# Patient Record
Sex: Male | Born: 1965 | Race: White | Hispanic: No | Marital: Single | Smoking: Former smoker
Health system: Southern US, Community
[De-identification: ages and names within clinical notes are randomized; demographics above are authoritative.]

## PROBLEM LIST (undated history)

## (undated) ENCOUNTER — Ambulatory Visit (HOSPITAL_COMMUNITY): Admission: EM | Payer: Self-pay | Source: Home / Self Care

## (undated) DIAGNOSIS — E039 Hypothyroidism, unspecified: Secondary | ICD-10-CM

## (undated) DIAGNOSIS — F419 Anxiety disorder, unspecified: Secondary | ICD-10-CM

## (undated) DIAGNOSIS — R519 Headache, unspecified: Secondary | ICD-10-CM

## (undated) DIAGNOSIS — F32A Depression, unspecified: Secondary | ICD-10-CM

## (undated) DIAGNOSIS — M7989 Other specified soft tissue disorders: Secondary | ICD-10-CM

## (undated) DIAGNOSIS — D496 Neoplasm of unspecified behavior of brain: Secondary | ICD-10-CM

## (undated) DIAGNOSIS — F1910 Other psychoactive substance abuse, uncomplicated: Secondary | ICD-10-CM

## (undated) DIAGNOSIS — F909 Attention-deficit hyperactivity disorder, unspecified type: Secondary | ICD-10-CM

## (undated) HISTORY — DX: Anxiety disorder, unspecified: F41.9

## (undated) HISTORY — DX: Depression, unspecified: F32.A

## (undated) HISTORY — PX: OTHER SURGICAL HISTORY: SHX169

---

## 2011-02-07 ENCOUNTER — Emergency Department (HOSPITAL_COMMUNITY)
Admission: EM | Admit: 2011-02-07 | Discharge: 2011-02-09 | Disposition: A | Discharge status: Home / Self Care | Payer: Self-pay | Attending: Emergency Medicine | Admitting: Emergency Medicine

## 2011-02-07 DIAGNOSIS — Z046 Encounter for general psychiatric examination, requested by authority: Secondary | ICD-10-CM | POA: Insufficient documentation

## 2011-02-07 DIAGNOSIS — F1910 Other psychoactive substance abuse, uncomplicated: Secondary | ICD-10-CM | POA: Insufficient documentation

## 2011-02-07 LAB — COMPREHENSIVE METABOLIC PANEL
Alkaline Phosphatase: 104 U/L (ref 39–117)
BUN: 15 mg/dL (ref 6–23)
CO2: 26 mEq/L (ref 19–32)
Chloride: 102 mEq/L (ref 96–112)
GFR calc Af Amer: >90 mL/min (ref >90)
Glucose, Bld: 68 mg/dL — ABNORMAL LOW (ref 70–99)
Potassium: 3.4 mEq/L — ABNORMAL LOW (ref 3.5–5.1)
Total Bilirubin: 0.1 mg/dL — ABNORMAL LOW (ref 0.3–1.2)

## 2011-02-07 LAB — URINALYSIS, ROUTINE W REFLEX MICROSCOPIC
Bilirubin Urine: NEGATIVE
Ketones, ur: NEGATIVE mg/dL
Nitrite: NEGATIVE
Urobilinogen, UA: 1 mg/dL (ref 0.0–1.0)
pH: 6.5 (ref 5.0–8.0)

## 2011-02-07 LAB — DIFFERENTIAL
Basophils Absolute: 0 10*3/uL (ref 0.0–0.1)
Lymphocytes Relative: 37 % (ref 12–46)
Neutro Abs: 1.4 10*3/uL — ABNORMAL LOW (ref 1.7–7.7)

## 2011-02-07 LAB — CBC
HCT: 48 % (ref 39.0–52.0)
Hemoglobin: 16.6 g/dL (ref 13.0–17.0)
WBC: 3.2 10*3/uL — ABNORMAL LOW (ref 4.0–10.5)

## 2011-02-07 LAB — ETHANOL: Alcohol, Ethyl (B): <11 mg/dL (ref 0–11)

## 2011-02-08 LAB — RAPID URINE DRUG SCREEN, HOSP PERFORMED
Barbiturates: NOT DETECTED
Benzodiazepines: NOT DETECTED

## 2011-03-24 ENCOUNTER — Encounter: Payer: Self-pay | Admitting: *Deleted

## 2011-03-24 ENCOUNTER — Emergency Department (HOSPITAL_COMMUNITY)
Admission: EM | Admit: 2011-03-24 | Discharge: 2011-03-24 | Discharge status: Against Medical Advice | Payer: Self-pay | Attending: Emergency Medicine | Admitting: Emergency Medicine

## 2011-03-24 DIAGNOSIS — F1110 Opioid abuse, uncomplicated: Secondary | ICD-10-CM | POA: Insufficient documentation

## 2011-03-24 DIAGNOSIS — Z4802 Encounter for removal of sutures: Secondary | ICD-10-CM | POA: Insufficient documentation

## 2011-03-24 DIAGNOSIS — F1190 Opioid use, unspecified, uncomplicated: Secondary | ICD-10-CM

## 2011-03-24 HISTORY — DX: Other psychoactive substance abuse, uncomplicated: F19.10

## 2011-03-24 HISTORY — DX: Neoplasm of unspecified behavior of brain: D49.6

## 2011-03-24 LAB — CBC
HCT: 45 % (ref 39.0–52.0)
RDW: 12.5 % (ref 11.5–15.5)
WBC: 9.9 10*3/uL (ref 4.0–10.5)

## 2011-03-24 LAB — COMPREHENSIVE METABOLIC PANEL
ALT: 12 U/L (ref 0–53)
AST: 13 U/L (ref 0–37)
Albumin: 4 g/dL (ref 3.5–5.2)
Alkaline Phosphatase: 99 U/L (ref 39–117)
Glucose, Bld: 96 mg/dL (ref 70–99)
Potassium: 3.6 mEq/L (ref 3.5–5.1)
Sodium: 139 mEq/L (ref 135–145)
Total Protein: 7.3 g/dL (ref 6.0–8.3)

## 2011-03-24 LAB — RAPID URINE DRUG SCREEN, HOSP PERFORMED
Amphetamines: POSITIVE — AB
Barbiturates: NOT DETECTED
Tetrahydrocannabinol: NOT DETECTED

## 2011-03-24 LAB — ETHANOL: Alcohol, Ethyl (B): <11 mg/dL (ref 0–11)

## 2011-03-24 MED ORDER — ZOLPIDEM TARTRATE 5 MG PO TABS: 5.0000 mg | ORAL_TABLET | Freq: Every evening | ORAL | Status: DC | PRN

## 2011-03-24 MED ORDER — LORAZEPAM 1 MG PO TABS: 1.0000 mg | ORAL_TABLET | Freq: Three times a day (TID) | ORAL | Status: DC | PRN

## 2011-03-24 MED ORDER — NICOTINE 21 MG/24HR TD PT24: 21.0000 mg | MEDICATED_PATCH | Freq: Every day | TRANSDERMAL | Status: DC

## 2011-03-24 NOTE — ED Provider Notes (Signed)
History     CSN: [id number] Arrival date & time: 03/24/2011  8:16 PM   None     Chief Complaint  Patient presents with  . Medical Clearance    (Consider location/radiation/quality/duration/timing/severity/associated sxs/prior treatment) HPI Comments: Patient is a 45 year old man who has been abusing heroin. He used last a little while ago. He comes in seeking detox treatment. He wants to go to hold [facility].  Patient is a 45 y.o. male presenting with mental health disorder. The history is provided by the patient and medical records. No language interpreter was used.  Mental Health Problem Primary symptoms comment: He is a heroin abuser, and has had treatment This is a chronic problem.  The degree of incapacity that he is experiencing as a consequence of his illness is moderate. Sequelae of the illness include harmed interpersonal relations. He does not admit to suicidal ideas. He does not have a plan to commit suicide. He does not contemplate harming himself. He has not already injured self. He does not contemplate injuring another person. He has not already  injured another person. Risk factors that are present for mental illness include substance abuse.    Past Medical History  Diagnosis Date  . Substance abuse   . Brain tumor     History reviewed. No pertinent past surgical history.  History reviewed. No pertinent family history.  History  Substance Use Topics  . Smoking status: Current Everyday Smoker  . Smokeless tobacco: Not on file  . Alcohol Use: No      Review of Systems  Constitutional: Negative.   HENT: Negative.   Eyes: Negative.   Respiratory: Negative.   Cardiovascular: Negative.   Gastrointestinal: Negative.   Genitourinary: Negative.   Musculoskeletal: Negative.   Skin:       He has a laceration on the left index finger that was sutured 10 days ago, that needs suture removal.  Neurological: Negative.   Psychiatric/Behavioral: Negative.      Allergies  Review of patient's allergies indicates no known allergies.  Home Medications   Current Outpatient Rx  Name Route Sig Dispense Refill  . AMPHETAMINE-DEXTROAMPHETAMINE 30 MG PO TABS Oral Take 30 mg by mouth 3 (three) times daily.      Marland Kitchen LEVOTHYROXINE SODIUM 150 MCG PO TABS Oral Take 150 mcg by mouth daily.        BP 135/83  Pulse 102  Temp(Src) 98.5 F (36.9 C) (Oral)  Resp 20  SpO2 97%  Physical Exam  Constitutional: He is oriented to person, place, and time. He appears well-developed and well-nourished. No distress.  HENT:  Head: Normocephalic and atraumatic.  Right Ear: External ear normal.  Left Ear: External ear normal.  Mouth/Throat: Oropharynx is clear and moist.  Eyes: Conjunctivae and EOM are normal. Pupils are equal, round, and reactive to light.  Neck: Normal range of motion. Neck supple.  Cardiovascular: Normal rate and regular rhythm.   Pulmonary/Chest: Effort normal and breath sounds normal.  Abdominal: Soft. Bowel sounds are normal.  Musculoskeletal: Normal range of motion.  Neurological: He is alert and oriented to person, place, and time.       No sensory or motor deficits. Patient is not tremulous.  Skin: Skin is warm and dry.       He has a healed laceration on the thenar side of the left index finger PIP joint. There is no infection. He is no apparent loss of sensation or tendon function. There is no apparent foreign body.  Psychiatric:  He has a normal mood and affect. His behavior is normal.    ED Course  SUTURE REMOVAL Date/Time: 03/24/2011 9:14 PM Performed by: Osvaldo Human Authorized by: Osvaldo Human Consent: Verbal consent obtained. Consent given by: patient Patient understanding: patient states understanding of the procedure being performed Patient consent: the patient's understanding of the procedure matches consent given Site marked: the operative site was not marked Imaging studies: imaging studies not  available Required items: required blood products, implants, devices, and special equipment available Patient identity confirmed: verbally with patient and hospital-assigned identification number Time out: Immediately prior to procedure a "time out" was called to verify the correct patient, procedure, equipment, support staff and site/side marked as required. Body area: upper extremity Location details: left index finger Wound Appearance: clean Sutures Removed: 4 Patient tolerance: Patient tolerated the procedure well with no immediate complications.   (including critical care time)  Labs Reviewed  COMPREHENSIVE METABOLIC PANEL - Abnormal; Notable for the following:    Total Bilirubin 0.1 (*)    GFR calc non Af Amer 82 (*)    All other components within normal limits  URINE RAPID DRUG SCREEN (HOSP PERFORMED) - Abnormal; Notable for the following:    Opiates POSITIVE (*)    Cocaine POSITIVE (*)    Amphetamines POSITIVE (*)    All other components within normal limits  CBC  ETHANOL    9:39 PM Pt seen --> physical exam performed.  Lab workup ordered.  Orders to move to Psych ED entered.  9:39 PM Pt wants to leave, because he was placed in a locked unit.  He is here voluntarily.  I asked if he would stay if he were in the TCU, and he agreed that he would.  Asked charge nurse to facilitate this.     1. Heroin user       Carleene Cooper III, MD 03/24/11 2139

## 2011-03-24 NOTE — ED Notes (Signed)
Pt in requesting opiate detox, states he wants to go to [facility], last use was a few hours ago, denies ETOH or other substance use, denies SI/HI

## 2013-05-03 ENCOUNTER — Ambulatory Visit: Payer: Self-pay | Admitting: Family Medicine

## 2013-05-08 ENCOUNTER — Ambulatory Visit: Payer: Self-pay | Admitting: Family Medicine

## 2013-05-16 ENCOUNTER — Ambulatory Visit: Payer: Self-pay | Admitting: Internal Medicine

## 2013-05-16 ENCOUNTER — Telehealth: Payer: Self-pay | Admitting: General Practice

## 2013-05-16 NOTE — Telephone Encounter (Signed)
05/16/13 Called pt to reschedule appt, invalid number.

## 2013-06-21 ENCOUNTER — Ambulatory Visit: Payer: Self-pay | Admitting: Internal Medicine

## 2016-04-01 DIAGNOSIS — C715 Malignant neoplasm of cerebral ventricle: Secondary | ICD-10-CM | POA: Insufficient documentation

## 2017-01-17 DIAGNOSIS — E039 Hypothyroidism, unspecified: Secondary | ICD-10-CM | POA: Insufficient documentation

## 2017-01-20 DIAGNOSIS — F0634 Mood disorder due to known physiological condition with mixed features: Secondary | ICD-10-CM | POA: Insufficient documentation

## 2017-01-20 DIAGNOSIS — F1010 Alcohol abuse, uncomplicated: Secondary | ICD-10-CM | POA: Insufficient documentation

## 2017-02-22 DIAGNOSIS — F1020 Alcohol dependence, uncomplicated: Secondary | ICD-10-CM | POA: Diagnosis present

## 2017-03-26 DIAGNOSIS — F10931 Alcohol use, unspecified with withdrawal delirium: Secondary | ICD-10-CM | POA: Insufficient documentation

## 2017-03-26 DIAGNOSIS — F10231 Alcohol dependence with withdrawal delirium: Secondary | ICD-10-CM | POA: Insufficient documentation

## 2017-05-23 ENCOUNTER — Emergency Department (HOSPITAL_COMMUNITY)
Admission: EM | Admit: 2017-05-23 | Discharge: 2017-05-24 | Discharge status: Against Medical Advice | Payer: Medicare HMO | Attending: Physician Assistant | Admitting: Physician Assistant

## 2017-05-23 ENCOUNTER — Other Ambulatory Visit: Payer: Self-pay

## 2017-05-23 ENCOUNTER — Encounter (HOSPITAL_COMMUNITY): Payer: Self-pay | Admitting: *Deleted

## 2017-05-23 DIAGNOSIS — F10920 Alcohol use, unspecified with intoxication, uncomplicated: Secondary | ICD-10-CM

## 2017-05-23 DIAGNOSIS — F17200 Nicotine dependence, unspecified, uncomplicated: Secondary | ICD-10-CM | POA: Insufficient documentation

## 2017-05-23 DIAGNOSIS — F1920 Other psychoactive substance dependence, uncomplicated: Secondary | ICD-10-CM | POA: Insufficient documentation

## 2017-05-23 DIAGNOSIS — Z79899 Other long term (current) drug therapy: Secondary | ICD-10-CM | POA: Diagnosis not present

## 2017-05-23 DIAGNOSIS — F1910 Other psychoactive substance abuse, uncomplicated: Secondary | ICD-10-CM

## 2017-05-23 DIAGNOSIS — F10120 Alcohol abuse with intoxication, uncomplicated: Secondary | ICD-10-CM | POA: Insufficient documentation

## 2017-05-23 LAB — COMPREHENSIVE METABOLIC PANEL
ALK PHOS: 131 U/L — AB (ref 38–126)
ALT: 25 U/L (ref 17–63)
ANION GAP: 15 (ref 5–15)
AST: 30 U/L (ref 15–41)
Albumin: 3.5 g/dL (ref 3.5–5.0)
BILIRUBIN TOTAL: 0.4 mg/dL (ref 0.3–1.2)
BUN: 20 mg/dL (ref 6–20)
CALCIUM: 8.9 mg/dL (ref 8.9–10.3)
CO2: 22 mmol/L (ref 22–32)
Chloride: 105 mmol/L (ref 101–111)
Creatinine, Ser: 1.42 mg/dL — ABNORMAL HIGH (ref 0.61–1.24)
GFR calc Af Amer: >60 mL/min (ref >60)
GFR calc non Af Amer: 57 mL/min — ABNORMAL LOW (ref >60)
Glucose, Bld: 123 mg/dL — ABNORMAL HIGH (ref 65–99)
POTASSIUM: 4.8 mmol/L (ref 3.5–5.1)
Sodium: 142 mmol/L (ref 135–145)
TOTAL PROTEIN: 6.6 g/dL (ref 6.5–8.1)

## 2017-05-23 LAB — RAPID URINE DRUG SCREEN, HOSP PERFORMED
AMPHETAMINES: NOT DETECTED
BENZODIAZEPINES: NOT DETECTED
Barbiturates: NOT DETECTED
Cocaine: NOT DETECTED
OPIATES: NOT DETECTED
Tetrahydrocannabinol: NOT DETECTED

## 2017-05-23 LAB — CBC
HCT: 45 % (ref 39.0–52.0)
HEMOGLOBIN: 15.6 g/dL (ref 13.0–17.0)
MCH: 34.6 pg — ABNORMAL HIGH (ref 26.0–34.0)
MCHC: 34.7 g/dL (ref 30.0–36.0)
MCV: 99.8 fL (ref 78.0–100.0)
PLATELETS: 394 10*3/uL (ref 150–400)
RBC: 4.51 MIL/uL (ref 4.22–5.81)
RDW: 13 % (ref 11.5–15.5)
WBC: 10.8 10*3/uL — AB (ref 4.0–10.5)

## 2017-05-23 LAB — ETHANOL: Alcohol, Ethyl (B): 365 mg/dL (ref <10)

## 2017-05-23 MED ORDER — VITAMIN B-1 100 MG PO TABS
100.0000 mg | ORAL_TABLET | Freq: Once | ORAL | Status: AC
  Administered 2017-05-23: 100 mg via ORAL
  Filled 2017-05-23: qty 1

## 2017-05-23 MED ORDER — NICOTINE 21 MG/24HR TD PT24
21.0000 mg | MEDICATED_PATCH | Freq: Once | TRANSDERMAL | Status: DC
  Administered 2017-05-23: 21 mg via TRANSDERMAL
  Filled 2017-05-23: qty 1

## 2017-05-23 MED ORDER — SODIUM CHLORIDE 0.9 % IV BOLUS (SEPSIS)
1000.0000 mL | Freq: Once | INTRAVENOUS | Status: AC
  Administered 2017-05-23: 1000 mL via INTRAVENOUS

## 2017-05-23 NOTE — ED Provider Notes (Signed)
[facility] EMERGENCY DEPARTMENT Provider Note   CSN: [id number] Arrival date & time: 05/23/17  [place]     History   Chief Complaint Chief Complaint  Patient presents with  . Alcohol Problem    HPI Jaysean Manville is a 52 y.o. male.  Hobert Poplaski is a 52 y.o. Male who presents to the emergency department requesting help with opioid withdrawal.  Patient reports he has been taking fentanyl and using heroin.  He tells me he has been using "enough to get to very addicted."  He reports today he felt like he was going through opioid withdrawal and so he drank a bunch of beer to help with his symptoms.  He tells me right now he is feeling much better currently.  He is worried that when the alcohol wears off he will feel symptoms of opioid withdrawal again.  He denies SI or HI.  He denies physical complaints currently.   The history is provided by the patient and medical records. No language interpreter was used.  Alcohol Problem  Pertinent negatives include no chest pain, no abdominal pain, no headaches and no shortness of breath.    Past Medical History:  Diagnosis Date  . Brain tumor ([place])   . Substance abuse ([place])     There are no active problems to display for this patient.   History reviewed. No pertinent surgical history.     Home Medications    Prior to Admission medications   Medication Sig Start Date End Date Taking? Authorizing Provider  amphetamine-dextroamphetamine (ADDERALL, 30MG ,) 30 MG tablet Take 30 mg by mouth 3 (three) times daily.      [provider]  levothyroxine (SYNTHROID, LEVOTHROID) 150 MCG tablet Take 150 mcg by mouth daily.      [provider]    Family History History reviewed. No pertinent family history.  Social History Social History   Tobacco Use  . Smoking status: Current Every Day Smoker  Substance Use Topics  . Alcohol use: Yes  . Drug use: Yes    Types: Cocaine    Comment: heroin      Allergies   Patient has no known allergies.   Review of Systems Review of Systems  Constitutional: Negative for fever.  HENT: Negative for sore throat.   Eyes: Negative for visual disturbance.  Respiratory: Negative for cough and shortness of breath.   Cardiovascular: Negative for chest pain.  Gastrointestinal: Negative for abdominal pain, diarrhea, nausea and vomiting.  Genitourinary: Negative for dysuria.  Musculoskeletal: Negative for back pain and neck pain.  Skin: Negative for rash.  Neurological: Negative for headaches.  Psychiatric/Behavioral: Negative for suicidal ideas. The patient is nervous/anxious.      Physical Exam Updated Vital Signs BP (!) 140/102 (BP Location: Right Arm)   Pulse (!) 108   Temp 98.2 F (36.8 C) (Oral)   Resp 16   SpO2 99%   Physical Exam  Constitutional: He is oriented to person, place, and time. He appears well-developed and well-nourished. No distress.  HENT:  Head: Normocephalic and atraumatic.  Right Ear: External ear normal.  Left Ear: External ear normal.  Eyes: Conjunctivae are normal. Pupils are equal, round, and reactive to light. Right eye exhibits no discharge. Left eye exhibits no discharge.  Neck: Neck supple.  Cardiovascular: Normal rate, regular rhythm, normal heart sounds and intact distal pulses.  HR 100  Pulmonary/Chest: Effort normal and breath sounds normal. No stridor. No respiratory distress. He has no wheezes.  Abdominal:  Soft. There is no tenderness. There is no guarding.  Musculoskeletal: He exhibits no edema.  Lymphadenopathy:    He has no cervical adenopathy.  Neurological: He is alert and oriented to person, place, and time. Coordination normal.  Patient is alert and oriented x3.  Speech is clear and coherent.  Not suicidal or homicidal ideations.  Skin: Skin is warm and dry. Capillary refill takes less than 2 seconds. No rash noted. He is not diaphoretic.  Psychiatric: His speech is normal and  behavior is normal. His mood appears anxious.  Appears slightly anxious. Denies SI or HI.   Nursing note and vitals reviewed.    ED Treatments / Results  Labs (all labs ordered are listed, but only abnormal results are displayed) Labs Reviewed  COMPREHENSIVE METABOLIC PANEL - Abnormal; Notable for the following components:      Result Value   Glucose, Bld 123 (*)    Creatinine, Ser 1.42 (*)    Alkaline Phosphatase 131 (*)    GFR calc non Af Amer 57 (*)    All other components within normal limits  ETHANOL - Abnormal; Notable for the following components:   Alcohol, Ethyl (B) 365 (*)    All other components within normal limits  CBC - Abnormal; Notable for the following components:   WBC 10.8 (*)    MCH 34.6 (*)    All other components within normal limits  RAPID URINE DRUG SCREEN, HOSP PERFORMED    EKG  EKG Interpretation None       Radiology No results found.  Procedures Procedures (including critical care time)  Medications Ordered in ED Medications  nicotine (NICODERM CQ - dosed in mg/24 hours) patch 21 mg (21 mg Transdermal Patch Applied 05/23/17 2038)  thiamine (VITAMIN B-1) tablet 100 mg (100 mg Oral Given 05/23/17 2007)  sodium chloride 0.9 % bolus 1,000 mL (0 mLs Intravenous Stopped 05/23/17 2306)     Initial Impression / Assessment and Plan / ED Course  I have reviewed the triage vital signs and the nursing notes.  Pertinent labs & imaging results that were available during my care of the patient were reviewed by me and considered in my medical decision making (see chart for details).    This is a 52 y.o. Male who presents to the emergency department requesting help with opioid withdrawal.  Patient reports he has been taking fentanyl and using heroin.  He tells me he has been using "enough to get to very addicted."  He reports today he felt like he was going through opioid withdrawal and so he drank a bunch of beer to help with his symptoms.  He tells me  right now he is feeling much better currently.  He is worried that when the alcohol wears off he will feel symptoms of opioid withdrawal again.  He denies SI or HI.  He denies physical complaints currently. On exam the patient is afebrile nontoxic-appearing.  His speech is clear and coherent.  He is calm and cooperative.  He denies SI or HI.  Heart rate is 100.  Abdomen is soft and nontender. CMP is remarkable for creatinine of 1.42.  He is mildly tachycardic.  Will provide with fluid bolus.  Ethanol level is 365.  CBC is remarkable for mild leukocytosis with a white count 10,800.  Awaiting urine drug screen. I discussed plan with patient to provide him with some thiamine, fluid bolus and give him something to eat.  He is agreeable and will plan to recheck  shortly.  I advised I could provide him with medications to help with symptoms of opioid withdrawal prior to discharge.  He agrees with plan. Patient ate a sandwich and is eating and drinking normally at recheck. Will plan to let him sober up and discharge shortly.  Later, RN reports he is walking out AMA. He has been walking with normal gait and his speech is clear and coherent and he is A&O x3 so will let him leave AMA at this time. I did not get to speak to him prior to the patient walking out.   Final Clinical Impressions(s) / ED Diagnoses   Final diagnoses:  Alcoholic intoxication without complication [facility])  Polysubstance abuse [facility])    ED Discharge Orders    None       Waynetta Pean, PA-C 05/24/17 0143    Macarthur Critchley, MD 05/25/17 1447

## 2017-05-23 NOTE — ED Notes (Signed)
Pt rolling around on stretcher, pt growling, stating he is going through heroin withdraw, last used last night.

## 2017-05-23 NOTE — ED Notes (Signed)
Monica Zahler - mother - [phone]

## 2017-05-23 NOTE — ED Notes (Signed)
Pt provided beverage and sandwich

## 2017-05-23 NOTE — ED Notes (Signed)
Provider at bedside

## 2017-05-23 NOTE — ED Triage Notes (Addendum)
Pt not wanting to answer questions at triage. States he needs help with alcohol withdrawal. Last drank sometime today. Denies SI or HI. Also reports needing detox from heroin, cocaine and nicotine.

## 2017-05-24 NOTE — ED Notes (Signed)
Pt became belligerent d/t wanting a cigarette and to be out of the hall and into a room.  Pt verbally aggressive to staff. Will, PA is aware.  Pt left AMA refused to sign.

## 2017-08-02 ENCOUNTER — Emergency Department (HOSPITAL_COMMUNITY)
Admission: EM | Admit: 2017-08-02 | Discharge: 2017-08-02 | Disposition: A | Discharge status: Home / Self Care | Payer: Medicare HMO | Attending: Emergency Medicine | Admitting: Emergency Medicine

## 2017-08-02 ENCOUNTER — Other Ambulatory Visit: Payer: Self-pay

## 2017-08-02 ENCOUNTER — Encounter (HOSPITAL_COMMUNITY): Payer: Self-pay

## 2017-08-02 DIAGNOSIS — Z79899 Other long term (current) drug therapy: Secondary | ICD-10-CM | POA: Insufficient documentation

## 2017-08-02 DIAGNOSIS — F17200 Nicotine dependence, unspecified, uncomplicated: Secondary | ICD-10-CM | POA: Diagnosis not present

## 2017-08-02 DIAGNOSIS — F1910 Other psychoactive substance abuse, uncomplicated: Secondary | ICD-10-CM | POA: Diagnosis not present

## 2017-08-02 DIAGNOSIS — F22 Delusional disorders: Secondary | ICD-10-CM | POA: Diagnosis not present

## 2017-08-02 DIAGNOSIS — F1010 Alcohol abuse, uncomplicated: Secondary | ICD-10-CM

## 2017-08-02 DIAGNOSIS — F10920 Alcohol use, unspecified with intoxication, uncomplicated: Secondary | ICD-10-CM

## 2017-08-02 LAB — RAPID URINE DRUG SCREEN, HOSP PERFORMED
AMPHETAMINES: NOT DETECTED
BARBITURATES: NOT DETECTED
Benzodiazepines: NOT DETECTED
Cocaine: NOT DETECTED
OPIATES: NOT DETECTED
TETRAHYDROCANNABINOL: NOT DETECTED

## 2017-08-02 LAB — CBC
HEMATOCRIT: 45.1 % (ref 39.0–52.0)
HEMOGLOBIN: 15.5 g/dL (ref 13.0–17.0)
MCH: 33.1 pg (ref 26.0–34.0)
MCHC: 34.4 g/dL (ref 30.0–36.0)
MCV: 96.4 fL (ref 78.0–100.0)
Platelets: 273 10*3/uL (ref 150–400)
RBC: 4.68 MIL/uL (ref 4.22–5.81)
RDW: 12.5 % (ref 11.5–15.5)
WBC: 7.1 10*3/uL (ref 4.0–10.5)

## 2017-08-02 LAB — ACETAMINOPHEN LEVEL: Acetaminophen (Tylenol), Serum: <10 ug/mL — ABNORMAL LOW (ref 10–30)

## 2017-08-02 LAB — COMPREHENSIVE METABOLIC PANEL
ALBUMIN: 4 g/dL (ref 3.5–5.0)
ALK PHOS: 93 U/L (ref 38–126)
ALT: 28 U/L (ref 17–63)
AST: 36 U/L (ref 15–41)
Anion gap: 12 (ref 5–15)
BUN: 14 mg/dL (ref 6–20)
CALCIUM: 8.6 mg/dL — AB (ref 8.9–10.3)
CO2: 22 mmol/L (ref 22–32)
CREATININE: 1.18 mg/dL (ref 0.61–1.24)
Chloride: 108 mmol/L (ref 101–111)
GFR calc Af Amer: >60 mL/min (ref >60)
GFR calc non Af Amer: >60 mL/min (ref >60)
GLUCOSE: 105 mg/dL — AB (ref 65–99)
Potassium: 4.4 mmol/L (ref 3.5–5.1)
Sodium: 142 mmol/L (ref 135–145)
Total Bilirubin: 0.3 mg/dL (ref 0.3–1.2)
Total Protein: 7 g/dL (ref 6.5–8.1)

## 2017-08-02 LAB — SALICYLATE LEVEL

## 2017-08-02 LAB — ETHANOL: Alcohol, Ethyl (B): 359 mg/dL (ref <10)

## 2017-08-02 MED ORDER — THIAMINE HCL 100 MG/ML IJ SOLN: 100.0000 mg | Freq: Every day | INTRAMUSCULAR | Status: DC

## 2017-08-02 MED ORDER — LEVOTHYROXINE SODIUM 112 MCG PO TABS
112.0000 ug | ORAL_TABLET | Freq: Every day | ORAL | Status: DC
  Administered 2017-08-02: 112 ug via ORAL
  Filled 2017-08-02: qty 1

## 2017-08-02 MED ORDER — LORAZEPAM 1 MG PO TABS: 0.0000 mg | ORAL_TABLET | Freq: Two times a day (BID) | ORAL | Status: DC

## 2017-08-02 MED ORDER — LORAZEPAM 1 MG PO TABS
1.0000 mg | ORAL_TABLET | Freq: Once | ORAL | Status: AC
  Administered 2017-08-02: 1 mg via ORAL
  Filled 2017-08-02: qty 1

## 2017-08-02 MED ORDER — LORAZEPAM 1 MG PO TABS: 0.0000 mg | ORAL_TABLET | Freq: Four times a day (QID) | ORAL | Status: DC

## 2017-08-02 MED ORDER — NICOTINE 14 MG/24HR TD PT24
14.0000 mg | MEDICATED_PATCH | Freq: Once | TRANSDERMAL | Status: DC
Start: 2017-08-02 — End: 2017-08-02

## 2017-08-02 MED ORDER — LORAZEPAM 2 MG/ML IJ SOLN: 0.0000 mg | Freq: Four times a day (QID) | INTRAMUSCULAR | Status: DC

## 2017-08-02 MED ORDER — NICOTINE 14 MG/24HR TD PT24
14.0000 mg | MEDICATED_PATCH | Freq: Once | TRANSDERMAL | Status: DC
  Administered 2017-08-02: 14 mg via TRANSDERMAL
  Filled 2017-08-02: qty 1

## 2017-08-02 MED ORDER — VITAMIN B-1 100 MG PO TABS
100.0000 mg | ORAL_TABLET | Freq: Every day | ORAL | Status: DC
  Administered 2017-08-02: 100 mg via ORAL
  Filled 2017-08-02: qty 1

## 2017-08-02 MED ORDER — PROPRANOLOL HCL 20 MG PO TABS
20.0000 mg | ORAL_TABLET | Freq: Three times a day (TID) | ORAL | Status: DC
  Administered 2017-08-02: 20 mg via ORAL
  Filled 2017-08-02 (×2): qty 1

## 2017-08-02 MED ORDER — LORAZEPAM 2 MG/ML IJ SOLN: 0.0000 mg | Freq: Two times a day (BID) | INTRAMUSCULAR | Status: DC

## 2017-08-02 MED ORDER — GABAPENTIN 100 MG PO CAPS
100.0000 mg | ORAL_CAPSULE | Freq: Two times a day (BID) | ORAL | Status: DC
  Administered 2017-08-02: 100 mg via ORAL
  Filled 2017-08-02: qty 1

## 2017-08-02 NOTE — ED Notes (Signed)
Informed primary RN, Leafy Ro, patient is cleared by psych, EDP will see patient and discharge to home

## 2017-08-02 NOTE — BH Assessment (Signed)
[facility] Assessment Progress Note  Per Buford Dresser, DO, this pt does not require psychiatric hospitalization at this time.  Pt is to be discharged from [facility].  Pt would benefit from seeing Peer Support Specialists; they will be asked to speak to pt.  No other discharge instructions are necessary.  Pt's nurse has been notified.  Jalene Mullet, [place] Triage Specialist [phone]

## 2017-08-02 NOTE — ED Notes (Signed)
Bed: [facility]ALA Expected date:  Expected time:  Means of arrival:  Comments: 

## 2017-08-02 NOTE — Patient Outreach (Signed)
ED Peer Support Specialist Patient Intake (Complete at intake & 30-60 Day Follow-up)  Name: Xzaviar Maloof  MRN: [id number]  Age: 52 y.o.   Date of Admission: 08/02/2017  Intake: Initial Comments:      Primary Reason Admitted: Alcoholic intoxication  Lab values: Alcohol/ETOH: Positive Positive UDS? Yes Amphetamines: Yes Barbiturates: No Benzodiazepines: No Cocaine: Yes Opiates: Yes Cannabinoids: No  Demographic information: Gender: Male Ethnicity: White Marital Status: Single Insurance Status: Medicare([employer] medicare ) Ecologist (Work Neurosurgeon, Physicist, medical, Social research officer, government.: Yes(SSI / disability ) Lives with: Alone Living situation: House/Apartment  Reported Patient History: Patient reported health conditions:   Patient aware of HIV and hepatitis status: No  In past year, has patient visited ED for any reason? Yes(Rehab in [place] )  Number of ED visits:    Reason(s) for visit:    In past year, has patient been hospitalized for any reason? No  Number of hospitalizations:    Reason(s) for hospitalization:    In past year, has patient been arrested? Yes  Number of arrests:    Reason(s) for arrest:    In past year, has patient been incarcerated? No  Number of incarcerations:    Reason(s) for incarceration:    In past year, has patient received medication-assisted treatment? No  In past year, patient received the following treatments:    In past year, has patient received any harm reduction services? No  Did this include any of the following?    In past year, has patient received care from a mental health provider for diagnosis other than SUD? No  In past year, is this first time patient has overdosed? No  Number of past overdoses:    In past year, is this first time patient has been hospitalized for an overdose? No  Number of hospitalizations for overdose(s):    Is patient currently receiving treatment for a mental health  diagnosis? No  Patient reports experiencing difficulty participating in SUD treatment: No    Most important reason(s) for this difficulty?    Has patient received prior services for treatment? No  In past, patient has received services from following agencies:    Plan of Care:  Suggested follow up at these agencies/treatment centers: [facility]), [facility]  Other information: CPSS was able to meet with Pt an monitor services. CPSS was able to speak with Pt about what concerns he was having and to see what concerns he was having . CPSS talked with Pt to complete the series of questions Cpss had to ask Pt. CPSS discussed several options that maybe helpful at. CPSS was able to send his information off to [facility] and or Step by Step facility. CPSS was able to give Pt information before being discharged an help him understand a better rout e to better the quality of his life. CPSS was able to give Pt contact information for several places that he may benefit from there services.    Aaron Edelman Brita Jurgensen, CPSS  08/02/2017 11:55 AM

## 2017-08-02 NOTE — ED Triage Notes (Signed)
Pt has no complaints pt states he is withdrawing from fentanyl and cocaine but states he doesn't take it.

## 2017-08-02 NOTE — ED Notes (Signed)
Patient has history of seizures and reports he has not been taking his Depakote "in a while." Charge nurse Stacy notified for patient placement in room and on monitor. This Probation officer was told by Charge that the patient is to be discharged and does not require seizure monitoring.

## 2017-08-02 NOTE — ED Notes (Signed)
TTS consulting with patient in conference room at this time.

## 2017-08-02 NOTE — ED Notes (Signed)
No respiratory or acute distress noted alert and oriented x 3 call light in reach. 

## 2017-08-02 NOTE — ED Notes (Signed)
Bed: WTR5 Expected date:  Expected time:  Means of arrival:  Comments: 

## 2017-08-02 NOTE — ED Notes (Signed)
Date and time results received: 08/02/17 0323 (use smartphrase ".now" to insert current time)  Test: alcohol Critical Value: 359  Name of Provider Notified: Pollina  Orders Received? Or Actions Taken?: none

## 2017-08-02 NOTE — ED Provider Notes (Signed)
[facility] DEPT Provider Note   CSN: [id number] Arrival date & time: 08/02/17  0147     History   Chief Complaint Chief Complaint  Patient presents with  . Medical Clearance    HPI Paul Bray is a 52 y.o. male.  Presents to the ER stating that he is withdrawing from multiple drugs.  He states over and over again "I am so in it".  He reports that he uses heroin, fentanyl, methadone, opium.  He also at one point said he was withdrawing from "cocaines", but then tells me he does not use cocaine.  He denies IV drug use, reports that he has to "break down the drugs" in order to snort them. Patient seems agitated and disorganized.  He denies homicidality and suicidality.  He cannot tell me what symptoms he is having of withdrawal.  He does admit to daily alcohol intake as well.     Past Medical History:  Diagnosis Date  . Brain tumor ([place])   . Substance abuse ([place])     There are no active problems to display for this patient.   History reviewed. No pertinent surgical history.      Home Medications    Prior to Admission medications   Medication Sig Start Date End Date Taking? Authorizing Provider  gabapentin (NEURONTIN) 100 MG capsule Take 100 mg by mouth 2 (two) times daily.   Yes [provider]  levothyroxine (SYNTHROID, LEVOTHROID) 112 MCG tablet Take 112 mcg by mouth daily before breakfast.   Yes [provider]  propranolol (INDERAL) 10 MG tablet Take 20 mg by mouth 3 (three) times daily.   Yes [provider]    Family History History reviewed. No pertinent family history.  Social History Social History   Tobacco Use  . Smoking status: Current Every Day Smoker  . Smokeless tobacco: Never Used  Substance Use Topics  . Alcohol use: Yes  . Drug use: Yes    Types: Cocaine    Comment: heroin     Allergies   Patient has no known allergies.   Review of Systems Review of Systems  Unable to  perform ROS: Psychiatric disorder     Physical Exam Updated Vital Signs BP (!) 130/113 (BP Location: Right Arm)   Pulse 79   Temp 97.7 F (36.5 C) (Oral)   Resp 18   Ht 5\' 10"  (1.778 m)   Wt 83.9 kg (185 lb)   SpO2 100%   BMI 26.54 kg/m   Physical Exam  Constitutional: He is oriented to person, place, and time. He appears well-developed and well-nourished. No distress.  HENT:  Head: Normocephalic and atraumatic.  Right Ear: Hearing normal.  Left Ear: Hearing normal.  Nose: Nose normal.  Mouth/Throat: Oropharynx is clear and moist and mucous membranes are normal.  Eyes: Pupils are equal, round, and reactive to light. Conjunctivae and EOM are normal.  Neck: Normal range of motion. Neck supple.  Cardiovascular: Regular rhythm, S1 normal and S2 normal. Exam reveals no gallop and no friction rub.  No murmur heard. Pulmonary/Chest: Effort normal and breath sounds normal. No respiratory distress. He exhibits no tenderness.  Abdominal: Soft. Normal appearance and bowel sounds are normal. There is no hepatosplenomegaly. There is no tenderness. There is no rebound, no guarding, no tenderness at McBurney's point and negative Murphy's sign. No hernia.  Musculoskeletal: Normal range of motion.  Neurological: He is alert and oriented to person, place, and time. He has normal strength. No cranial  nerve deficit or sensory deficit. Coordination normal. GCS eye subscore is 4. GCS verbal subscore is 5. GCS motor subscore is 6.  Skin: Skin is warm, dry and intact. No rash noted. No cyanosis.  Psychiatric: His mood appears anxious. His speech is rapid and/or pressured. He is agitated and hyperactive.  Nursing note and vitals reviewed.    ED Treatments / Results  Labs (all labs ordered are listed, but only abnormal results are displayed) Labs Reviewed  COMPREHENSIVE METABOLIC PANEL - Abnormal; Notable for the following components:      Result Value   Glucose, Bld 105 (*)    Calcium 8.6 (*)      All other components within normal limits  CBC  ETHANOL  SALICYLATE LEVEL  ACETAMINOPHEN LEVEL  RAPID URINE DRUG SCREEN, HOSP PERFORMED    EKG None  Radiology No results found.  Procedures Procedures (including critical care time)  Medications Ordered in ED Medications - No data to display   Initial Impression / Assessment and Plan / ED Course  I have reviewed the triage vital signs and the nursing notes.  Pertinent labs & imaging results that were available during my care of the patient were reviewed by me and considered in my medical decision making (see chart for details).     Patient presents to the ER stating that he is withdrawing from drugs.  His vital signs, however, are essentially normal.  He cannot tell me any of his symptoms of withdrawal.  He has been contradictory with what drugs he uses and does not use.  He seems very disorganized.  I believe he is more delusional than anything else.  He denies being homicidal and suicidal at this time, but will have psychiatric evaluation.  Final Clinical Impressions(s) / ED Diagnoses   Final diagnoses:  Polysubstance abuse [facility])    ED Discharge Orders    None       Donnesha Karg, Gwenyth Allegra, MD 08/02/17 [phone]

## 2017-08-03 ENCOUNTER — Encounter (HOSPITAL_COMMUNITY): Payer: Self-pay | Admitting: *Deleted

## 2017-08-03 ENCOUNTER — Emergency Department (HOSPITAL_COMMUNITY)
Admission: EM | Admit: 2017-08-03 | Discharge: 2017-08-04 | Disposition: A | Discharge status: Home / Self Care | Payer: Medicare HMO | Attending: Emergency Medicine | Admitting: Emergency Medicine

## 2017-08-03 DIAGNOSIS — F1010 Alcohol abuse, uncomplicated: Secondary | ICD-10-CM

## 2017-08-03 DIAGNOSIS — F060 Psychotic disorder with hallucinations due to known physiological condition: Secondary | ICD-10-CM | POA: Diagnosis not present

## 2017-08-03 DIAGNOSIS — Z79899 Other long term (current) drug therapy: Secondary | ICD-10-CM | POA: Insufficient documentation

## 2017-08-03 DIAGNOSIS — F17200 Nicotine dependence, unspecified, uncomplicated: Secondary | ICD-10-CM | POA: Insufficient documentation

## 2017-08-03 DIAGNOSIS — R440 Auditory hallucinations: Secondary | ICD-10-CM | POA: Insufficient documentation

## 2017-08-03 DIAGNOSIS — F17210 Nicotine dependence, cigarettes, uncomplicated: Secondary | ICD-10-CM | POA: Diagnosis not present

## 2017-08-03 DIAGNOSIS — F1994 Other psychoactive substance use, unspecified with psychoactive substance-induced mood disorder: Secondary | ICD-10-CM | POA: Diagnosis not present

## 2017-08-03 DIAGNOSIS — D496 Neoplasm of unspecified behavior of brain: Secondary | ICD-10-CM | POA: Insufficient documentation

## 2017-08-03 DIAGNOSIS — Y906 Blood alcohol level of 120-199 mg/100 ml: Secondary | ICD-10-CM | POA: Diagnosis not present

## 2017-08-03 LAB — COMPREHENSIVE METABOLIC PANEL
ALK PHOS: 84 U/L (ref 38–126)
ALT: 32 U/L (ref 17–63)
ANION GAP: 9 (ref 5–15)
AST: 39 U/L (ref 15–41)
Albumin: 3.9 g/dL (ref 3.5–5.0)
BILIRUBIN TOTAL: 1.2 mg/dL (ref 0.3–1.2)
BUN: 13 mg/dL (ref 6–20)
CALCIUM: 8.3 mg/dL — AB (ref 8.9–10.3)
CO2: 21 mmol/L — ABNORMAL LOW (ref 22–32)
Chloride: 113 mmol/L — ABNORMAL HIGH (ref 101–111)
Creatinine, Ser: 0.99 mg/dL (ref 0.61–1.24)
GFR calc non Af Amer: >60 mL/min (ref >60)
Glucose, Bld: 97 mg/dL (ref 65–99)
Potassium: 4.1 mmol/L (ref 3.5–5.1)
SODIUM: 143 mmol/L (ref 135–145)
TOTAL PROTEIN: 6.4 g/dL — AB (ref 6.5–8.1)

## 2017-08-03 LAB — CBC WITH DIFFERENTIAL/PLATELET
BASOS ABS: 0 10*3/uL (ref 0.0–0.1)
BASOS PCT: 0 %
Eosinophils Absolute: 0 10*3/uL (ref 0.0–0.7)
Eosinophils Relative: 1 %
HCT: 40.4 % (ref 39.0–52.0)
HEMOGLOBIN: 14.1 g/dL (ref 13.0–17.0)
Lymphocytes Relative: 35 %
Lymphs Abs: 1.6 10*3/uL (ref 0.7–4.0)
MCH: 32.7 pg (ref 26.0–34.0)
MCHC: 34.9 g/dL (ref 30.0–36.0)
MCV: 93.7 fL (ref 78.0–100.0)
Monocytes Absolute: 0.2 10*3/uL (ref 0.1–1.0)
Monocytes Relative: 5 %
NEUTROS PCT: 59 %
Neutro Abs: 2.8 10*3/uL (ref 1.7–7.7)
Platelets: 192 10*3/uL (ref 150–400)
RBC: 4.31 MIL/uL (ref 4.22–5.81)
RDW: 12.1 % (ref 11.5–15.5)
WBC: 4.7 10*3/uL (ref 4.0–10.5)

## 2017-08-03 LAB — TSH: TSH: 1.459 u[IU]/mL (ref 0.350–4.500)

## 2017-08-03 LAB — ETHANOL: Alcohol, Ethyl (B): 122 mg/dL — ABNORMAL HIGH (ref <10)

## 2017-08-03 MED ORDER — QUETIAPINE FUMARATE 50 MG PO TABS
50.0000 mg | ORAL_TABLET | Freq: Every evening | ORAL | Status: DC | PRN
  Administered 2017-08-03: 50 mg via ORAL
  Filled 2017-08-03: qty 1

## 2017-08-03 MED ORDER — GABAPENTIN 300 MG PO CAPS
300.0000 mg | ORAL_CAPSULE | Freq: Three times a day (TID) | ORAL | Status: DC
  Administered 2017-08-03 – 2017-08-04 (×3): 300 mg via ORAL
  Filled 2017-08-03 (×3): qty 1

## 2017-08-03 MED ORDER — LORAZEPAM 2 MG/ML IJ SOLN: 0.0000 mg | Freq: Four times a day (QID) | INTRAMUSCULAR | Status: DC

## 2017-08-03 MED ORDER — LORAZEPAM 1 MG PO TABS: 0.0000 mg | ORAL_TABLET | Freq: Two times a day (BID) | ORAL | Status: DC

## 2017-08-03 MED ORDER — VITAMIN B-1 100 MG PO TABS
100.0000 mg | ORAL_TABLET | Freq: Every day | ORAL | Status: DC
  Administered 2017-08-03 – 2017-08-04 (×2): 100 mg via ORAL
  Filled 2017-08-03 (×2): qty 1

## 2017-08-03 MED ORDER — THIAMINE HCL 100 MG/ML IJ SOLN: 100.0000 mg | Freq: Every day | INTRAMUSCULAR | Status: DC

## 2017-08-03 MED ORDER — LORAZEPAM 1 MG PO TABS
0.0000 mg | ORAL_TABLET | Freq: Four times a day (QID) | ORAL | Status: DC
  Administered 2017-08-03: 1 mg via ORAL
  Administered 2017-08-03 (×2): 2 mg via ORAL
  Filled 2017-08-03: qty 1
  Filled 2017-08-03 (×2): qty 2

## 2017-08-03 MED ORDER — LORAZEPAM 2 MG/ML IJ SOLN: 0.0000 mg | Freq: Two times a day (BID) | INTRAMUSCULAR | Status: DC

## 2017-08-03 MED ORDER — NICOTINE 14 MG/24HR TD PT24
14.0000 mg | MEDICATED_PATCH | Freq: Once | TRANSDERMAL | Status: DC
Start: 2017-08-03 — End: 2017-08-04
  Administered 2017-08-03: 14 mg via TRANSDERMAL
  Filled 2017-08-03: qty 1

## 2017-08-03 NOTE — B[facility] Counselor (Signed)
Disposition:   Paul Blossom, NP, recommend patient be observed overnight for safety, stability and security. Patient will be re-assessed in the morning.

## 2017-08-03 NOTE — ED Notes (Signed)
Bed: WBH36 Expected date:  Expected time:  Means of arrival:  Comments: Hold for triage 3 

## 2017-08-03 NOTE — ED Triage Notes (Signed)
Per EMS, pt complains of AV hallucinations. Pt denies HI/SI. Pt last used heroin 1 day ago. States "I have 10 different voices in my head".

## 2017-08-03 NOTE — ED Notes (Addendum)
Patient moved from triage 4 to room 36.  Belongings placed in locker 10.  Patient calm and cooperative resting on bed.  Patient reports he is withdrawing from ETOH and heroin.

## 2017-08-03 NOTE — ED Notes (Signed)
Pt sleeping at present, no distress noted, calm & cooperative.  Audio and visual hallucinations noted.  Phlebotomy called for bloodwork.  Monitoring for safety, Q 15 min checks in effect.

## 2017-08-03 NOTE — BH Assessment (Signed)
Tele Assessment Note   Patient Name: Paul Bray MRN: [id number] Referring Physician: Francine Graven, DO Location of Patient: [facility] Location of Provider: [facility]  Nygel Prokop is an 52 y.o. male brought to [facility] via EMS with complaints of auditory / visual hallucinations. Patient was in the ER on yesterday 08/02/2017 with complaints of abusing and withdrawing from multiple drugs such as heroin, fentanyl, methadone, cocaine and opium, however, patient UDS's negative for all substances. When asked what brings you back to the ER patient state,"I think somebody missed something." Patient discussed for the past 10-years he has been living with a brain tumor that is affecting his health. 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 Report,"I have 10 people in my head and I have conversations with them all. The conversations are not bad but very interesting." Report lack of sleep due the inability to rest, "I am up and down all night. I get about 2 hours of sleep in cycles at a time." Report he loses track of time and days feels like weeks. Patient could not remember being in the ER on yesterday believed he was in the ER weeks ago. Patient believes he abuses drugs with last use of heroin yesterday, UDS's negative for substances.  Disposition: Jinny Blossom, NP, recommend monitoring patient overnight for safety and stability and re-assess in the morning.     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 Brain tumor ([place])   . Substance abuse ([place])     History reviewed. No pertinent surgical history.  Family History: No family history on file.  Social History:  reports that he has been  smoking.  He has never used smokeless tobacco. He reports that he drinks alcohol. He reports that he has current or past drug history. Drug: Cocaine.  Additional Social History:  Alcohol / Drug Use Pain Medications: see MAR Prescriptions: see MAR Over the Counter: see MAR History of alcohol / drug use?: No history of alcohol / drug abuse(UDS's neg) Longest period of sobriety (when/how long): N/A  CIWA: CIWA-Ar BP: (!) 159/115 Pulse Rate: 62 Nausea and Vomiting: 3 Tactile Disturbances: none Tremor: no tremor Auditory Disturbances: not present Paroxysmal Sweats: no sweat visible Visual Disturbances: not present Anxiety: moderately anxious, or guarded, so anxiety is inferred Headache, Fullness in Head: mild Agitation: two Orientation and Clouding of Sensorium: oriented and can do serial additions CIWA-Ar Total: 11 COWS:    Allergies: No Known Allergies  Home Medications:  (Not in a hospital admission)  OB/GYN Status:  No LMP for male patient.  General Assessment Data [facility] of Assessment: [facility] TTS Assessment: In system Is this a Tele or Face-to-Face Assessment?: Face-to-Face Is this an Initial Assessment or a Re-assessment for this encounter?: Initial Assessment Marital status: Single Maiden name: n/a Is patient pregnant?: No Pregnancy Status: No Living Arrangements: Other (Comment)(report has roommates) Can pt return to current living arrangement?: Yes Admission Status: Voluntary Is patient capable of signing voluntary admission?: Yes Referral Source: Self/Family/Friend Insurance type: [employer] features [employer]     Crisis Care Plan Living Arrangements: Other (Comment)(report has roommates) Legal Guardian: Other:(self) Name of Psychiatrist: pt denies Name of Therapist: pt denies  Education Status Is patient currently in school?: No Is the patient  employed, unemployed or receiving disability?: Unemployed, Receiving disability income  Risk to self with the past 6  months Suicidal Ideation: No Has patient been a risk to self within the past 6 months prior to admission? : No Suicidal Intent: No Has patient had any suicidal intent within the past 6 months prior to admission? : No Is patient at risk for suicide?: No Suicidal Plan?: No Has patient had any suicidal plan within the past 6 months prior to admission? : No Access to Means: No What has been your use of drugs/alcohol within the last 12 months?: UDS's negative  Previous Attempts/Gestures: No How many times?: 0 Other Self Harm Risks: pt denies Triggers for Past Attempts: None known Intentional Self Injurious Behavior: None Family Suicide History: No Recent stressful life event(s): Other (Comment)(pt report medical complications ) Persecutory voices/beliefs?: Yes(report has 10 different voices in his head) Depression: Yes Depression Symptoms: Loss of interest in usual pleasures, Insomnia Substance abuse history and/or treatment for substance abuse?: No Suicide prevention information given to non-admitted patients: Not applicable  Risk to Others within the past 6 months Homicidal Ideation: No Does patient have any lifetime risk of violence toward others beyond the six months prior to admission? : No Thoughts of Harm to Others: No Current Homicidal Intent: No Current Homicidal Plan: No Access to Homicidal Means: No Identified Victim: n/a History of harm to others?: No Assessment of Violence: None Noted Violent Behavior Description: pt denies Does patient have access to weapons?: No Criminal Charges Pending?: Yes Describe Pending Criminal Charges: report false alligation of simple assault Does patient have a court date: Yes Court Date: 08/30/17(report charges should be dismissed once goes to court) Is patient on probation?: No  Psychosis Hallucinations: Auditory(report hear 10 different voices ) Delusions: None noted  Mental Status Report Appearance/Hygiene: Disheveled Eye  Contact: Fair Motor Activity: Freedom of movement Speech: Logical/coherent Level of Consciousness: Alert Mood: Helpless, Preoccupied Affect: Preoccupied Anxiety Level: Minimal Thought Processes: Coherent Judgement: Unimpaired Orientation: Person, Place, Time, Situation Obsessive Compulsive Thoughts/Behaviors: None  Cognitive Functioning Concentration: Normal Memory: Recent Impaired Is patient IDD: No Is patient DD?: No Insight: Fair Impulse Control: Fair Appetite: Poor Have you had any weight changes? : No Change Sleep: No Change Total Hours of Sleep: (report inconsistent sleep, up/down all night) Vegetative Symptoms: None  ADLScreening [facility] Assessment Services) Patient's cognitive ability adequate to safely complete daily activities?: Yes Patient able to express need for assistance with ADLs?: Yes Independently performs ADLs?: Yes (appropriate for developmental age)  Prior Inpatient Therapy Prior Inpatient Therapy: No  Prior Outpatient Therapy Prior Outpatient Therapy: No Does patient have an ACCT team?: No Does patient have Intensive In-House Services?  : No Does patient have [facility] services? : No Does patient have P4CC services?: No  ADL Screening (condition at time of admission) Patient's cognitive ability adequate to safely complete daily activities?: Yes Is the patient deaf or have difficulty hearing?: No Does the patient have difficulty seeing, even when wearing glasses/contacts?: No Does the patient have difficulty concentrating, remembering, or making decisions?: No Patient able to express need for assistance with ADLs?: Yes Does the patient have difficulty dressing or bathing?: No Independently performs ADLs?: Yes (appropriate for developmental age) Does the patient have difficulty walking or climbing stairs?: No       Abuse/Neglect Assessment (Assessment to be complete while patient is alone) Abuse/Neglect Assessment Can Be Completed: Yes Physical  Abuse: Denies Verbal Abuse: Denies Sexual Abuse: Denies Exploitation of patient/patient's resources: Denies Self-Neglect: Denies  Advance Directives (For Healthcare) Does Patient Have a Medical Advance Directive?: No Would patient like information on creating a medical advance directive?: No - Patient declined    Additional Information 1:1 In Past 12 Months?: No CIRT Risk: No Elopement Risk: No Does patient have medical clearance?: No     Disposition:  Disposition Initial Assessment Completed for this Encounter: Yes Disposition of Patient: Admit(Laurie Romilda Garret, NP, montior overnight for stability ) Patient referred to: Other (Comment)(overnight for stability and safety )    Zhanae Proffit 08/03/2017 2:24 PM

## 2017-08-03 NOTE — ED Provider Notes (Signed)
[facility] DEPT Provider Note   CSN: [id number] Arrival date & time: 08/03/17  6568     History   Chief Complaint Chief Complaint  Patient presents with  . Hallucinations  . Manic Behavior    HPI Paul Bray is a 52 y.o. male.  HPI  Pt was seen at 0905. Per pt, c/o gradual onset and persistence of constant "voices in my head" for "a while." Pt states he uses drugs "to put them to sleep for a while." Pt states he "has not used drugs in about a week." States "it's a long walk home" and he "needs to keep coming to the ER until this all is figured out." Denies SI, no HI, no SA.    Past Medical History:  Diagnosis Date  . Brain tumor ([place])   . Substance abuse ([place])     There are no active problems to display for this patient.   History reviewed. No pertinent surgical history.      Home Medications    Prior to Admission medications   Medication Sig Start Date End Date Taking? Authorizing Provider  gabapentin (NEURONTIN) 100 MG capsule Take 100 mg by mouth 2 (two) times daily.    [provider]  levothyroxine (SYNTHROID, LEVOTHROID) 112 MCG tablet Take 112 mcg by mouth daily before breakfast.    [provider]  propranolol (INDERAL) 10 MG tablet Take 20 mg by mouth 3 (three) times daily.    [provider]    Family History No family history on file.  Social History Social History   Tobacco Use  . Smoking status: Current Every Day Smoker  . Smokeless tobacco: Never Used  Substance Use Topics  . Alcohol use: Yes  . Drug use: Yes    Types: Cocaine    Comment: heroin     Allergies   Patient has no known allergies.   Review of Systems Review of Systems ROS: Statement: All systems negative except as marked or noted in the HPI; Constitutional: Negative for fever and chills. ; ; Eyes: Negative for eye pain, redness and discharge. ; ; ENMT: Negative for ear pain, hoarseness, nasal congestion, sinus  pressure and sore throat. ; ; Cardiovascular: Negative for chest pain, palpitations, diaphoresis, dyspnea and peripheral edema. ; ; Respiratory: Negative for cough, wheezing and stridor. ; ; Gastrointestinal: Negative for nausea, vomiting, diarrhea, abdominal pain, blood in stool, hematemesis, jaundice and rectal bleeding. . ; ; Genitourinary: Negative for dysuria, flank pain and hematuria. ; ; Musculoskeletal: Negative for back pain and neck pain. Negative for swelling and trauma.; ; Skin: Negative for pruritus, rash, abrasions, blisters, bruising and skin lesion.; ; Neuro: Negative for headache, lightheadedness and neck stiffness. Negative for weakness, altered level of consciousness, altered mental status, extremity weakness, paresthesias, involuntary movement, seizure and syncope.; Psych:  No SI, no SA, no HI, +auditory hallucinations.      Physical Exam Updated Vital Signs BP (!) 159/115 (BP Location: Left Arm)   Pulse 62   Temp 98.1 F (36.7 C) (Oral)   Resp 18   SpO2 98%   Physical Exam 0910: Physical examination:  Nursing notes reviewed; Vital signs and O2 SAT reviewed;  Constitutional: Well developed, Well nourished, Well hydrated, In no acute distress; Head:  Normocephalic, atraumatic; Eyes: EOMI, PERRL, No scleral icterus; ENMT: Mouth and pharynx normal, Mucous membranes moist; Neck: Supple, Full range of motion; Cardiovascular: Regular rate and rhythm; Respiratory: Breath sounds clear, No wheezes.  Speaking full sentences with ease,  Normal respiratory effort/excursion; Chest: No deformity, Movement normal; Abdomen: Nondistended; Extremities: No deformity.; Neuro: AA&Ox3, Major CN grossly intact.  Speech clear. No gross focal motor deficits in extremities. Climbs on and off stretcher easily by himself. Gait steady.; Skin: Color normal, Warm, Dry.; Psych:  Rapid pressured speech.     ED Treatments / Results  Labs (all labs ordered are listed, but only abnormal results are  displayed)   EKG None  Radiology   Procedures Procedures (including critical care time)  Medications Ordered in ED Medications - No data to display   Initial Impression / Assessment and Plan / ED Course  I have reviewed the triage vital signs and the nursing notes.  Pertinent labs & imaging results that were available during my care of the patient were reviewed by me and considered in my medical decision making (see chart for details).   MDM Reviewed: previous chart, nursing note and vitals Reviewed previous: labs Interpretation: labs    Results for orders placed or performed during the hospital encounter of 08/03/17  Comprehensive metabolic panel  Result Value Ref Range   Sodium 143 135 - 145 mmol/L   Potassium 4.1 3.5 - 5.1 mmol/L   Chloride 113 (H) 101 - 111 mmol/L   CO2 21 (L) 22 - 32 mmol/L   Glucose, Bld 97 65 - 99 mg/dL   BUN 13 6 - 20 mg/dL   Creatinine, Ser 0.99 0.61 - 1.24 mg/dL   Calcium 8.3 (L) 8.9 - 10.3 mg/dL   Total Protein 6.4 (L) 6.5 - 8.1 g/dL   Albumin 3.9 3.5 - 5.0 g/dL   AST 39 15 - 41 U/L   ALT 32 17 - 63 U/L   Alkaline Phosphatase 84 38 - 126 U/L   Total Bilirubin 1.2 0.3 - 1.2 mg/dL   GFR calc non Af Amer >60 >60 mL/min   GFR calc Af Amer >60 >60 mL/min   Anion gap 9 5 - 15  Ethanol  Result Value Ref Range   Alcohol, Ethyl (B) 122 (H) <10 mg/dL  CBC with Differential  Result Value Ref Range   WBC 4.7 4.0 - 10.5 K/uL   RBC 4.31 4.22 - 5.81 MIL/uL   Hemoglobin 14.1 13.0 - 17.0 g/dL   HCT 40.4 39.0 - 52.0 %   MCV 93.7 78.0 - 100.0 fL   MCH 32.7 26.0 - 34.0 pg   MCHC 34.9 30.0 - 36.0 g/dL   RDW 12.1 11.5 - 15.5 %   Platelets 192 150 - 400 K/uL   Neutrophils Relative % 59 %   Neutro Abs 2.8 1.7 - 7.7 K/uL   Lymphocytes Relative 35 %   Lymphs Abs 1.6 0.7 - 4.0 K/uL   Monocytes Relative 5 %   Monocytes Absolute 0.2 0.1 - 1.0 K/uL   Eosinophils Relative 1 %   Eosinophils Absolute 0.0 0.0 - 0.7 K/uL   Basophils Relative 0 %    Basophils Absolute 0.0 0.0 - 0.1 K/uL    1235:  CIWA protocol ordered on pt's arrival to ED. Will have TTS evaluate.      Final Clinical Impressions(s) / ED Diagnoses   Final diagnoses:  None    ED Discharge Orders    None       Francine Graven, DO 08/03/17 1458

## 2017-08-04 DIAGNOSIS — F419 Anxiety disorder, unspecified: Secondary | ICD-10-CM | POA: Diagnosis not present

## 2017-08-04 DIAGNOSIS — R450 Nervousness: Secondary | ICD-10-CM | POA: Diagnosis not present

## 2017-08-04 DIAGNOSIS — Y906 Blood alcohol level of 120-199 mg/100 ml: Secondary | ICD-10-CM | POA: Diagnosis not present

## 2017-08-04 DIAGNOSIS — F1994 Other psychoactive substance use, unspecified with psychoactive substance-induced mood disorder: Secondary | ICD-10-CM | POA: Diagnosis not present

## 2017-08-04 DIAGNOSIS — F17210 Nicotine dependence, cigarettes, uncomplicated: Secondary | ICD-10-CM

## 2017-08-04 DIAGNOSIS — F1010 Alcohol abuse, uncomplicated: Secondary | ICD-10-CM | POA: Diagnosis not present

## 2017-08-04 DIAGNOSIS — F1410 Cocaine abuse, uncomplicated: Secondary | ICD-10-CM

## 2017-08-04 LAB — T4, FREE: Free T4: 0.79 ng/dL (ref 0.61–1.12)

## 2017-08-04 NOTE — ED Notes (Signed)
Pt discharged home. Discharged instructions read to pt who verbalized understanding. All belongings returned to pt who signed for same. Denies SI/HI, is not delusional and not responding to internal stimuli. Escorted pt to the ED exit.    

## 2017-08-04 NOTE — Discharge Instructions (Signed)
For your mental health and substance abuse treatment needs, you are advised to follow up with the [facility].  Contact them at your earliest opportunity to ask about scheduling an intake appointment:       [facility]      [address] [place], [place] [address]      [phone]

## 2017-08-04 NOTE — [facility] Suicide Risk Assessment (Signed)
Suicide Risk Assessment  Discharge Assessment   [facility] Discharge Suicide Risk Assessment   Principal Problem: Substance induced mood disorder [facility]) Discharge Diagnoses:  Patient Active Problem List   Diagnosis Date Noted  . Substance induced mood disorder ([place]) [F19.94] 08/04/2017    Total Time spent with patient: 45 minutes  Musculoskeletal: Strength & Muscle Tone: within normal limits Gait & Station: normal Patient leans: N/A  Psychiatric Specialty Exam: Physical Exam  Constitutional: He is oriented to person, place, and time. He appears well-developed and well-nourished.  Respiratory: Effort normal.  Musculoskeletal: Normal range of motion.  Neurological: He is alert and oriented to person, place, and time.  Psychiatric: His speech is normal and behavior is normal. Thought content normal. His mood appears anxious. Cognition and memory are normal. He expresses impulsivity. He exhibits a depressed mood.   Review of Systems  Psychiatric/Behavioral: Positive for depression and substance abuse. Negative for hallucinations, memory loss and suicidal ideas. The patient is nervous/anxious. The patient does not have insomnia.   All other systems reviewed and are negative.  Blood pressure 113/79, pulse 79, temperature 98.7 F (37.1 C), resp. rate 18, SpO2 98 %.There is no height or weight on file to calculate BMI. General Appearance: Casual Eye Contact:  Good Speech:  Clear and Coherent Volume:  Normal Mood:  Depressed Affect:  Congruent and Depressed Thought Process:  Coherent and Linear Orientation:  Full (Time, Place, and Person) Thought Content:  Logical Suicidal Thoughts:  No Homicidal Thoughts:  No Memory:  Immediate;   Good Recent;   Good Remote;   Fair Judgement:  Fair Insight:  Fair Psychomotor Activity:  Normal Concentration:  Concentration: Good and Attention Span: Good Recall:  Good Fund of Knowledge:  Good Language:  Good Akathisia:  No Handed:  Right AIMS  (if indicated):    Assets:  Agricultural consultant Housing ADL's:  Intact Cognition:  WNL   Mental Status Per Nursing Assessment::   On Admission:   Intoxicated  Demographic Factors:  Male, Caucasian and Unemployed  Loss Factors: Financial problems/change in socioeconomic status  Historical Factors: Impulsivity  Risk Reduction Factors:   Sense of responsibility to family and Living with another person, especially a relative  Continued Clinical Symptoms:  Depression:   Impulsivity Alcohol/Substance Abuse/Dependencies More than one psychiatric diagnosis Previous Psychiatric Diagnoses and Treatments Medical Diagnoses and Treatments/Surgeries  Cognitive Features That Contribute To Risk:  Closed-mindedness    Suicide Risk:  Minimal: No identifiable suicidal ideation.  Patients presenting with no risk factors but with morbid ruminations; may be classified as minimal risk based on the severity of the depressive symptoms    Plan Of Care/Follow-up recommendations:  Activity:  as tolerated Diet:  Heart Healthy  Ethelene Hal, NP 08/04/2017, 12:20 PM

## 2017-08-04 NOTE — Consult Note (Addendum)
[place] Psychiatry Consult   Reason for Consult:  Intoxication Referring Physician:  EDP Patient Identification: Paul Bray MRN:  [id number] Principal Diagnosis: Substance induced mood disorder (Post) Diagnosis:   Patient Active Problem List   Diagnosis Date Noted  . Substance induced mood disorder [facility]) [F19.94] 08/04/2017    Total Time spent with patient: 45 minutes  Subjective:   Paul Bray is a 52 y.o. male patient admitted with alcohol intoxication.  HPI:  Pt was seen and chart reviewed with treatment team and Dr Mariea Clonts. Pt denies suicidal/homicidal ideation, denies auditory/visual hallucinations and does not appear to be responding to internal stimuli. Pt stated he has been using drugs and drinking and wants help for his substance abuse. Pt has a history of a brain tumor and stated he thinks there has to be a cure for why he can't quit alcohol and why he can't get out of bed. Pt was in rehab in [place] in December and managed to stay sober for 2 weeks. Pt will be given outpatient resources for substance abuse treatment programs in the community. Pt's BAL 122 and UDS negative. Pt stated that he has been using heroin but his UDS was negative. Pt lives in [place] with his mother. Pt is psychiatrically clear for discharge.   Past Psychiatric History: Alcohol abuse.   Risk to Self: None Prior Inpatient Therapy: Prior Inpatient Therapy: No Prior Outpatient Therapy: Prior Outpatient Therapy: No Does patient have an ACCT team?: No Does patient have Intensive In-House Services?  : No Does patient have [facility] services? : No Does patient have P4CC services?: No  Past Medical History:  Past Medical History:  Diagnosis Date  . Brain tumor ([place])   . Substance abuse ([place])    History reviewed. No pertinent surgical history. Family History: No family history on file. Family Psychiatric  History: Denies  Social History:  Social History   Substance and Sexual  Activity  Alcohol Use Yes     Social History   Substance and Sexual Activity  Drug Use Yes  . Types: Cocaine   Comment: heroin    Social History   Socioeconomic History  . Marital status: Single    Spouse name: Not on file  . Number of children: Not on file  . Years of education: Not on file  . Highest education level: Not on file  Occupational History  . Not on file  Social Needs  . Financial resource strain: Not on file  . Food insecurity:    Worry: Not on file    Inability: Not on file  . Transportation needs:    Medical: Not on file    Non-medical: Not on file  Tobacco Use  . Smoking status: Current Every Day Smoker  . Smokeless tobacco: Never Used  Substance and Sexual Activity  . Alcohol use: Yes  . Drug use: Yes    Types: Cocaine    Comment: heroin  . Sexual activity: Not on file  Lifestyle  . Physical activity:    Days per week: Not on file    Minutes per session: Not on file  . Stress: Not on file  Relationships  . Social connections:    Talks on phone: Not on file    Gets together: Not on file    Attends religious service: Not on file    Active member of club or organization: Not on file    Attends meetings of clubs or organizations: Not on file    Relationship status:  Not on file  Other Topics Concern  . Not on file  Social History Narrative  . Not on file   Additional Social History: He receives disability.     Allergies:  No Known Allergies  Labs:  Results for orders placed or performed during the hospital encounter of 08/03/17 (from the past 48 hour(s))  Comprehensive metabolic panel     Status: Abnormal   Collection Time: 08/03/17 11:29 AM  Result Value Ref Range   Sodium 143 135 - 145 mmol/L   Potassium 4.1 3.5 - 5.1 mmol/L   Chloride 113 (H) 101 - 111 mmol/L   CO2 21 (L) 22 - 32 mmol/L   Glucose, Bld 97 65 - 99 mg/dL   BUN 13 6 - 20 mg/dL   Creatinine, Ser 0.99 0.61 - 1.24 mg/dL   Calcium 8.3 (L) 8.9 - 10.3 mg/dL   Total  Protein 6.4 (L) 6.5 - 8.1 g/dL   Albumin 3.9 3.5 - 5.0 g/dL   AST 39 15 - 41 U/L   ALT 32 17 - 63 U/L   Alkaline Phosphatase 84 38 - 126 U/L   Total Bilirubin 1.2 0.3 - 1.2 mg/dL   GFR calc non Af Amer >60 >60 mL/min   GFR calc Af Amer >60 >60 mL/min    Comment: (NOTE) The eGFR has been calculated using the CKD EPI equation. This calculation has not been validated in all clinical situations. eGFR's persistently <60 mL/min signify possible Chronic Kidney Disease.    Anion gap 9 5 - 15    Comment: Performed at [facility], [place] [address] [place], [place] [address]  Ethanol     Status: Abnormal   Collection Time: 08/03/17 11:29 AM  Result Value Ref Range   Alcohol, Ethyl (B) 122 (H) <10 mg/dL    Comment:        LOWEST DETECTABLE LIMIT FOR SERUM ALCOHOL IS 10 mg/dL FOR MEDICAL PURPOSES ONLY Performed at [facility] [address] [place], [place] [address]   CBC with Differential     Status: None   Collection Time: 08/03/17 11:29 AM  Result Value Ref Range   WBC 4.7 4.0 - 10.5 K/uL   RBC 4.31 4.22 - 5.81 MIL/uL   Hemoglobin 14.1 13.0 - 17.0 g/dL   HCT 40.4 39.0 - 52.0 %   MCV 93.7 78.0 - 100.0 fL   MCH 32.7 26.0 - 34.0 pg   MCHC 34.9 30.0 - 36.0 g/dL   RDW 12.1 11.5 - 15.5 %   Platelets 192 150 - 400 K/uL   Neutrophils Relative % 59 %   Neutro Abs 2.8 1.7 - 7.7 K/uL   Lymphocytes Relative 35 %   Lymphs Abs 1.6 0.7 - 4.0 K/uL   Monocytes Relative 5 %   Monocytes Absolute 0.2 0.1 - 1.0 K/uL   Eosinophils Relative 1 %   Eosinophils Absolute 0.0 0.0 - 0.7 K/uL   Basophils Relative 0 %   Basophils Absolute 0.0 0.0 - 0.1 K/uL    Comment: Performed at [facility] [place] [address] [place], [place] [address]  TSH     Status: None   Collection Time: 08/03/17  8:26 PM  Result Value Ref Range   TSH 1.459 0.350 - 4.500 uIU/mL    Comment: Performed by a 3rd Generation assay with a functional sensitivity of <=0.01  uIU/mL. Performed at [facility], [place] [address], [place], [place] [address]   T4, free  Status: None   Collection Time: 08/03/17  8:26 PM  Result Value Ref Range   Free T4 0.79 0.61 - 1.12 ng/dL    Comment: (NOTE) Biotin ingestion may interfere with free T4 tests. If the results are inconsistent with the TSH level, previous test results, or the clinical presentation, then consider biotin interference. If needed, order repeat testing after stopping biotin. Performed at [facility], [place] [address] [place], [place] [address]     Current Facility-Administered Medications  Medication Dose Route Frequency Provider Last Rate Last Dose  . gabapentin (NEURONTIN) capsule 300 mg  300 mg Oral TID Ethelene Hal, NP   300 mg at 08/04/17 1044  . LORazepam (ATIVAN) injection 0-4 mg  0-4 mg Intravenous Q6H Francine Graven, DO       Or  . LORazepam (ATIVAN) tablet 0-4 mg  0-4 mg Oral Q6H Francine Graven, DO   Stopped at 08/04/17 0239  . [START ON 08/05/2017] LORazepam (ATIVAN) injection 0-4 mg  0-4 mg Intravenous Q12H Francine Graven, DO       Or  . Derrill Memo ON 08/05/2017] LORazepam (ATIVAN) tablet 0-4 mg  0-4 mg Oral Q12H Francine Graven, DO      . nicotine (NICODERM CQ - dosed in mg/24 hours) patch 14 mg  14 mg Transdermal Once Tegeler, Gwenyth Allegra, MD   14 mg at 08/03/17 2055  . QUEtiapine (SEROQUEL) tablet 50 mg  50 mg Oral QHS,MR X 1 Laverle Hobby, PA-C   Stopped at 08/03/17 2320  . thiamine (VITAMIN B-1) tablet 100 mg  100 mg Oral Daily Francine Graven, DO   100 mg at 08/04/17 1044   Or  . thiamine (B-1) injection 100 mg  100 mg Intravenous Daily Francine Graven, DO       Current Outpatient Medications  Medication Sig Dispense Refill  . acetaminophen (TYLENOL) 500 MG tablet Take 1,000-1,500 mg by mouth daily as needed for headache.    Marland Kitchen aspirin 500 MG tablet Take 1,000 mg by mouth daily as needed for pain (headache).    . gabapentin  (NEURONTIN) 100 MG capsule Take 100 mg by mouth 2 (two) times daily.    Marland Kitchen ibuprofen (ADVIL,MOTRIN) 200 MG tablet Take 400-600 mg by mouth daily as needed for headache.    . levothyroxine (SYNTHROID, LEVOTHROID) 112 MCG tablet Take 112 mcg by mouth daily before breakfast.    . propranolol (INDERAL) 10 MG tablet Take 20 mg by mouth 3 (three) times daily.      Musculoskeletal: Strength & Muscle Tone: within normal limits Gait & Station: normal Patient leans: N/A  Psychiatric Specialty Exam: Physical Exam  Nursing note and vitals reviewed. Constitutional: He is oriented to person, place, and time. He appears well-developed and well-nourished.  HENT:  Head: Normocephalic and atraumatic.  Neck: Normal range of motion.  Respiratory: Effort normal.  Musculoskeletal: Normal range of motion.  Neurological: He is alert and oriented to person, place, and time.  Psychiatric: His speech is normal and behavior is normal. Thought content normal. His mood appears anxious. Cognition and memory are normal. He expresses impulsivity. He exhibits a depressed mood.    Review of Systems  Psychiatric/Behavioral: Positive for depression and substance abuse. Negative for hallucinations, memory loss and suicidal ideas. The patient is nervous/anxious. The patient does not have insomnia.   All other systems reviewed and are negative.   Blood pressure 113/79, pulse 79, temperature 98.7 F (37.1 C), resp. rate 18, SpO2 98 %.There is no height or  weight on file to calculate BMI.  General Appearance: Casual  Eye Contact:  Good  Speech:  Clear and Coherent  Volume:  Normal  Mood:  Depressed  Affect:  Congruent and Depressed  Thought Process:  Coherent and Linear  Orientation:  Full (Time, Place, and Person)  Thought Content:  Logical  Suicidal Thoughts:  No  Homicidal Thoughts:  No  Memory:  Immediate;   Good Recent;   Good Remote;   Fair  Judgement:  Fair  Insight:  Fair  Psychomotor Activity:  Normal   Concentration:  Concentration: Good and Attention Span: Good  Recall:  Good  Fund of Knowledge:  Good  Language:  Good  Akathisia:  No  Handed:  Right  AIMS (if indicated):   N/A  Assets:  Agricultural consultant Housing  ADL's:  Intact  Cognition:  WNL  Sleep:   N/A     Treatment Plan Summary: Plan Substance induced mood disorder ([place])  Discharge Home Follow up with outpatient resources provided by Peer Support for substance abuse treatment Take all medications as prescribed Avoid the use of alcohol and illicit drugs  Disposition: No evidence of imminent risk to self or others at present.   Patient does not meet criteria for psychiatric inpatient admission. Supportive therapy provided about ongoing stressors. Discussed crisis plan, support from social network, calling 911, coming to the Emergency Department, and calling Suicide Hotline.  Ethelene Hal, NP 08/04/2017 12:12 PM   Patient seen face-to-face for psychiatric evaluation, chart reviewed and case discussed with the physician extender and developed treatment plan. Reviewed the information documented and agree with the treatment plan.  Buford Dresser, DO 08/04/17 4:51 PM

## 2017-08-04 NOTE — Patient Outreach (Signed)
[facility] has been in the process of making referrals for the patient for detox alcohol/heroin. Patient was denied from [facility], [facility], and [facility] due to medical reasons. [facility] will provide the patient with other substance use recovery resources.

## 2017-08-04 NOTE — ED Notes (Signed)
Pt remains asleep at this time, no complaints voiced.  Calm & cooperative.  Will continue to monitor.

## 2017-08-04 NOTE — BH Assessment (Signed)
[facility] Assessment Progress Note  Per Buford Dresser, DO, this pt does not require psychiatric hospitalization at this time.  Pt is to be discharged from [facility] with recommenation to follow up with the [facility] for his mental health and substance abuse treatment needs.  This has been included in pt's discharge instructions.  Pt's nurse, Diane, has been notified.  Jalene Mullet, [place] Triage Specialist [phone]

## 2017-08-04 NOTE — Patient Outreach (Signed)
[facility] met with the patient and provided substance use recovery support. [facility] also provided substance use recovery resources with AA/NA meeting list, residential/outpatient substance use treatment centers list, and information for  [facility] detox program. [facility] already has [facility] contact information and encourages the patient to contact [facility] for further substance use recovery support or help with resources.

## 2017-08-04 NOTE — BH Assessment (Signed)
[facility] Assessment Progress Note  Per Buford Dresser, DO, this pt does not require psychiatric hospitalization at this time.  Pt is to be discharged from [facility] with recommendation to follow up with the [facility] for his mental health and substance abuse treatment needs.  This has been included in pt's discharge instructions.  Pt's nurse, Diane, has been notified.  Jalene Mullet, [place] Triage Specialist [phone]

## 2017-08-04 NOTE — Discharge Instructions (Signed)
For your behavioral health and substance abuse treatment needs, you are advised to follow up with the [facility].  Contact them at your earliest opportunity to ask about scheduling an intake appointment:       [facility]      [address] [place], [place] [address]      [phone]

## 2017-12-22 ENCOUNTER — Encounter (HOSPITAL_COMMUNITY): Payer: Self-pay | Admitting: Emergency Medicine

## 2017-12-22 ENCOUNTER — Emergency Department (HOSPITAL_COMMUNITY)
Admission: EM | Admit: 2017-12-22 | Discharge: 2017-12-22 | Disposition: A | Discharge status: Home / Self Care | Payer: Medicare HMO | Attending: Emergency Medicine | Admitting: Emergency Medicine

## 2017-12-22 DIAGNOSIS — F1123 Opioid dependence with withdrawal: Secondary | ICD-10-CM | POA: Insufficient documentation

## 2017-12-22 DIAGNOSIS — F17210 Nicotine dependence, cigarettes, uncomplicated: Secondary | ICD-10-CM | POA: Insufficient documentation

## 2017-12-22 DIAGNOSIS — F1193 Opioid use, unspecified with withdrawal: Secondary | ICD-10-CM

## 2017-12-22 DIAGNOSIS — Z79899 Other long term (current) drug therapy: Secondary | ICD-10-CM | POA: Insufficient documentation

## 2017-12-22 MED ORDER — PROMETHAZINE HCL 25 MG PO TABS: 25.0000 mg | ORAL_TABLET | Freq: Four times a day (QID) | ORAL | 0 refills | Status: DC | PRN

## 2017-12-22 MED ORDER — METHOCARBAMOL 500 MG PO TABS: 500.0000 mg | ORAL_TABLET | Freq: Two times a day (BID) | ORAL | 0 refills | Status: DC

## 2017-12-22 NOTE — ED Notes (Signed)
Pt seen by this RN gathering belongings and walking out of ED after EDP advised that the ED does not do detox. Pt was awaiting peer support. Pt ambulated without difficulty. Pt did not notify staff he was leaving. Pt was on phone as he was leaving. Zenia Resides MD aware

## 2017-12-22 NOTE — ED Provider Notes (Signed)
[facility] DEPT Provider Note   CSN: [id number] Arrival date & time: 12/22/17  1206     History   Chief Complaint Chief Complaint  Patient presents with  . detox    HPI Paul Bray is a 52 y.o. male.  52 year old male requesting detox from heroin.  Last use was a few hours ago when he injected into his right antecubital fossa.  Denies any withdrawal symptoms at this time.  No suicidal homicidal ideations.  He has been using Imodium.  Denies any concurrent use of alcohol.  No other illicit drug use.  Denies any emesis.     Past Medical History:  Diagnosis Date  . Brain tumor ([place])   . Substance abuse [facility])     Patient Active Problem List   Diagnosis Date Noted  . Substance induced mood disorder ([place]) 08/04/2017    History reviewed. No pertinent surgical history.      Home Medications    Prior to Admission medications   Medication Sig Start Date End Date Taking? Authorizing Provider  acetaminophen (TYLENOL) 500 MG tablet Take 1,000-1,500 mg by mouth daily as needed for headache.    [provider]  aspirin 500 MG tablet Take 1,000 mg by mouth daily as needed for pain (headache).    [provider]  gabapentin (NEURONTIN) 100 MG capsule Take 100 mg by mouth 2 (two) times daily.    [provider]  ibuprofen (ADVIL,MOTRIN) 200 MG tablet Take 400-600 mg by mouth daily as needed for headache.    [provider]  levothyroxine (SYNTHROID, LEVOTHROID) 112 MCG tablet Take 112 mcg by mouth daily before breakfast.    [provider]  propranolol (INDERAL) 10 MG tablet Take 20 mg by mouth 3 (three) times daily.    [provider]    Family History No family history on file.  Social History Social History   Tobacco Use  . Smoking status: Current Every Day Smoker    Types: Cigarettes  . Smokeless tobacco: Never Used  Substance Use Topics  . Alcohol use: Yes  . Drug use: Yes   Types: Cocaine, IV    Comment: heroin     Allergies   Patient has no known allergies.   Review of Systems Review of Systems  All other systems reviewed and are negative.    Physical Exam Updated Vital Signs BP 90/64   Pulse 62   Temp (!) 97.5 F (36.4 C)   Resp 19   SpO2 98%   Physical Exam  Constitutional: He is oriented to person, place, and time. He appears well-developed and well-nourished.  Non-toxic appearance. No distress.  HENT:  Head: Normocephalic and atraumatic.  Eyes: Pupils are equal, round, and reactive to light. Conjunctivae, EOM and lids are normal.  Neck: Normal range of motion. Neck supple. No tracheal deviation present. No thyroid mass present.  Cardiovascular: Normal rate, regular rhythm and normal heart sounds. Exam reveals no gallop.  No murmur heard. Pulmonary/Chest: Effort normal and breath sounds normal. No stridor. No respiratory distress. He has no decreased breath sounds. He has no wheezes. He has no rhonchi. He has no rales.  Abdominal: Soft. Normal appearance and bowel sounds are normal. He exhibits no distension. There is no tenderness. There is no rebound and no CVA tenderness.  Musculoskeletal: Normal range of motion. He exhibits no edema or tenderness.  Neurological: He is alert and oriented to person, place, and time. He has normal strength. No cranial nerve deficit  or sensory deficit. GCS eye subscore is 4. GCS verbal subscore is 5. GCS motor subscore is 6.  Skin: Skin is warm and dry. No abrasion and no rash noted.  Psychiatric: His speech is normal. His mood appears anxious. He is agitated.  Nursing note and vitals reviewed.    ED Treatments / Results  Labs (all labs ordered are listed, but only abnormal results are displayed) Labs Reviewed - No data to display  EKG None  Radiology No results found.  Procedures Procedures (including critical care time)  Medications Ordered in ED Medications - No data to  display   Initial Impression / Assessment and Plan / ED Course  I have reviewed the triage vital signs and the nursing notes.  Pertinent labs & imaging results that were available during my care of the patient were reviewed by me and considered in my medical decision making (see chart for details).     Patient has no active signs of withdrawal at this time.  Became very upset when I informed him that we did offer inpatient detox.  Will prescribe patient Phenergan and Robaxin.  I have counseled her peers support and will give the patient outpatient resources. Final Clinical Impressions(s) / ED Diagnoses   Final diagnoses:  None    ED Discharge Orders    None       Lacretia Leigh, MD 12/22/17 1258

## 2017-12-22 NOTE — ED Triage Notes (Signed)
Pt repots that he wanting detox from heroin. Last use couple hours ago. Denies any s/s of withdrawal at this time. Pt has been tapering down hisself. Reports that he can go 24 hours without using. Pt reports that he was taking imodium 25-50 pills per day when feeling bad. Pt reports that he quit drinking and his last drink was more than a month ago.  Denies SI or HI.

## 2017-12-22 NOTE — ED Notes (Signed)
Pt left prior to DC papers being reviewed with pt

## 2017-12-22 NOTE — ED Notes (Signed)
ED Provider at bedside. 

## 2017-12-24 DIAGNOSIS — F17210 Nicotine dependence, cigarettes, uncomplicated: Secondary | ICD-10-CM | POA: Insufficient documentation

## 2017-12-24 DIAGNOSIS — F1120 Opioid dependence, uncomplicated: Secondary | ICD-10-CM | POA: Insufficient documentation

## 2018-01-09 ENCOUNTER — Encounter (HOSPITAL_COMMUNITY): Payer: Self-pay | Admitting: Emergency Medicine

## 2018-01-09 ENCOUNTER — Inpatient Hospital Stay (HOSPITAL_COMMUNITY)
Admission: EM | Admit: 2018-01-09 | Discharge: 2018-01-11 | DRG: 894 | Discharge status: Against Medical Advice | Payer: Medicare HMO | Attending: Student in an Organized Health Care Education/Training Program | Admitting: Student in an Organized Health Care Education/Training Program

## 2018-01-09 DIAGNOSIS — F1910 Other psychoactive substance abuse, uncomplicated: Secondary | ICD-10-CM | POA: Diagnosis present

## 2018-01-09 DIAGNOSIS — F10920 Alcohol use, unspecified with intoxication, uncomplicated: Secondary | ICD-10-CM

## 2018-01-09 DIAGNOSIS — Z79899 Other long term (current) drug therapy: Secondary | ICD-10-CM

## 2018-01-09 DIAGNOSIS — Y908 Blood alcohol level of 240 mg/100 ml or more: Secondary | ICD-10-CM | POA: Diagnosis present

## 2018-01-09 DIAGNOSIS — Z923 Personal history of irradiation: Secondary | ICD-10-CM

## 2018-01-09 DIAGNOSIS — X58XXXA Exposure to other specified factors, initial encounter: Secondary | ICD-10-CM | POA: Diagnosis present

## 2018-01-09 DIAGNOSIS — Z5321 Procedure and treatment not carried out due to patient leaving prior to being seen by health care provider: Secondary | ICD-10-CM | POA: Diagnosis not present

## 2018-01-09 DIAGNOSIS — F419 Anxiety disorder, unspecified: Secondary | ICD-10-CM | POA: Diagnosis present

## 2018-01-09 DIAGNOSIS — S022XXA Fracture of nasal bones, initial encounter for closed fracture: Secondary | ICD-10-CM

## 2018-01-09 DIAGNOSIS — F10239 Alcohol dependence with withdrawal, unspecified: Secondary | ICD-10-CM

## 2018-01-09 DIAGNOSIS — R413 Other amnesia: Secondary | ICD-10-CM | POA: Diagnosis present

## 2018-01-09 DIAGNOSIS — F10220 Alcohol dependence with intoxication, uncomplicated: Secondary | ICD-10-CM | POA: Diagnosis present

## 2018-01-09 DIAGNOSIS — M79674 Pain in right toe(s): Secondary | ICD-10-CM | POA: Diagnosis present

## 2018-01-09 DIAGNOSIS — F17210 Nicotine dependence, cigarettes, uncomplicated: Secondary | ICD-10-CM | POA: Diagnosis present

## 2018-01-09 DIAGNOSIS — F1193 Opioid use, unspecified with withdrawal: Secondary | ICD-10-CM | POA: Diagnosis not present

## 2018-01-09 DIAGNOSIS — F1020 Alcohol dependence, uncomplicated: Secondary | ICD-10-CM | POA: Diagnosis present

## 2018-01-09 DIAGNOSIS — S0081XA Abrasion of other part of head, initial encounter: Secondary | ICD-10-CM | POA: Diagnosis present

## 2018-01-09 DIAGNOSIS — F10939 Alcohol use, unspecified with withdrawal, unspecified: Secondary | ICD-10-CM

## 2018-01-09 DIAGNOSIS — E039 Hypothyroidism, unspecified: Secondary | ICD-10-CM | POA: Diagnosis present

## 2018-01-09 DIAGNOSIS — D496 Neoplasm of unspecified behavior of brain: Secondary | ICD-10-CM | POA: Diagnosis present

## 2018-01-09 DIAGNOSIS — F1123 Opioid dependence with withdrawal: Secondary | ICD-10-CM | POA: Diagnosis present

## 2018-01-09 DIAGNOSIS — M109 Gout, unspecified: Secondary | ICD-10-CM | POA: Diagnosis present

## 2018-01-09 DIAGNOSIS — R52 Pain, unspecified: Secondary | ICD-10-CM

## 2018-01-09 DIAGNOSIS — R609 Edema, unspecified: Secondary | ICD-10-CM

## 2018-01-09 MED ORDER — ZIPRASIDONE MESYLATE 20 MG IM SOLR
20.0000 mg | Freq: Once | INTRAMUSCULAR | Status: AC
  Administered 2018-01-09: 20 mg via INTRAMUSCULAR

## 2018-01-09 MED ORDER — SODIUM CHLORIDE 0.9 % IV BOLUS
1000.0000 mL | Freq: Once | INTRAVENOUS | Status: AC
  Administered 2018-01-09: 1000 mL via INTRAVENOUS

## 2018-01-09 MED ORDER — LORAZEPAM 2 MG/ML IJ SOLN
INTRAMUSCULAR | Status: AC
  Filled 2018-01-09: qty 1

## 2018-01-09 MED ORDER — LORAZEPAM 2 MG/ML IJ SOLN
2.0000 mg | Freq: Once | INTRAMUSCULAR | Status: AC
  Administered 2018-01-09: 2 mg via INTRAMUSCULAR
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
## 2018-01-10 ENCOUNTER — Emergency Department (HOSPITAL_COMMUNITY): Payer: Medicare HMO

## 2018-01-10 ENCOUNTER — Other Ambulatory Visit: Payer: Self-pay

## 2018-01-10 ENCOUNTER — Encounter (HOSPITAL_COMMUNITY): Payer: Self-pay | Admitting: Emergency Medicine

## 2018-01-10 DIAGNOSIS — S022XXD Fracture of nasal bones, subsequent encounter for fracture with routine healing: Secondary | ICD-10-CM | POA: Diagnosis not present

## 2018-01-10 DIAGNOSIS — Y908 Blood alcohol level of 240 mg/100 ml or more: Secondary | ICD-10-CM | POA: Diagnosis present

## 2018-01-10 DIAGNOSIS — X58XXXA Exposure to other specified factors, initial encounter: Secondary | ICD-10-CM | POA: Diagnosis present

## 2018-01-10 DIAGNOSIS — F10932 Alcohol use, unspecified with withdrawal with perceptual disturbance: Secondary | ICD-10-CM | POA: Insufficient documentation

## 2018-01-10 DIAGNOSIS — F10239 Alcohol dependence with withdrawal, unspecified: Secondary | ICD-10-CM | POA: Diagnosis present

## 2018-01-10 DIAGNOSIS — Z923 Personal history of irradiation: Secondary | ICD-10-CM | POA: Diagnosis not present

## 2018-01-10 DIAGNOSIS — M109 Gout, unspecified: Secondary | ICD-10-CM | POA: Diagnosis present

## 2018-01-10 DIAGNOSIS — X58XXXD Exposure to other specified factors, subsequent encounter: Secondary | ICD-10-CM | POA: Diagnosis not present

## 2018-01-10 DIAGNOSIS — F1120 Opioid dependence, uncomplicated: Secondary | ICD-10-CM | POA: Insufficient documentation

## 2018-01-10 DIAGNOSIS — F17210 Nicotine dependence, cigarettes, uncomplicated: Secondary | ICD-10-CM | POA: Diagnosis present

## 2018-01-10 DIAGNOSIS — F1193 Opioid use, unspecified with withdrawal: Secondary | ICD-10-CM | POA: Diagnosis not present

## 2018-01-10 DIAGNOSIS — F1123 Opioid dependence with withdrawal: Secondary | ICD-10-CM | POA: Diagnosis present

## 2018-01-10 DIAGNOSIS — F1910 Other psychoactive substance abuse, uncomplicated: Secondary | ICD-10-CM | POA: Diagnosis present

## 2018-01-10 DIAGNOSIS — M79674 Pain in right toe(s): Secondary | ICD-10-CM | POA: Diagnosis present

## 2018-01-10 DIAGNOSIS — E039 Hypothyroidism, unspecified: Secondary | ICD-10-CM | POA: Diagnosis present

## 2018-01-10 DIAGNOSIS — S022XXA Fracture of nasal bones, initial encounter for closed fracture: Secondary | ICD-10-CM | POA: Diagnosis present

## 2018-01-10 DIAGNOSIS — F10220 Alcohol dependence with intoxication, uncomplicated: Secondary | ICD-10-CM | POA: Diagnosis present

## 2018-01-10 DIAGNOSIS — F419 Anxiety disorder, unspecified: Secondary | ICD-10-CM | POA: Diagnosis present

## 2018-01-10 DIAGNOSIS — D496 Neoplasm of unspecified behavior of brain: Secondary | ICD-10-CM | POA: Diagnosis present

## 2018-01-10 DIAGNOSIS — R413 Other amnesia: Secondary | ICD-10-CM | POA: Diagnosis present

## 2018-01-10 DIAGNOSIS — F10232 Alcohol dependence with withdrawal with perceptual disturbance: Secondary | ICD-10-CM | POA: Insufficient documentation

## 2018-01-10 DIAGNOSIS — Z5321 Procedure and treatment not carried out due to patient leaving prior to being seen by health care provider: Secondary | ICD-10-CM | POA: Diagnosis not present

## 2018-01-10 DIAGNOSIS — S0081XA Abrasion of other part of head, initial encounter: Secondary | ICD-10-CM | POA: Diagnosis present

## 2018-01-10 DIAGNOSIS — Z79899 Other long term (current) drug therapy: Secondary | ICD-10-CM | POA: Diagnosis not present

## 2018-01-10 LAB — COMPREHENSIVE METABOLIC PANEL
ALT: 17 U/L (ref 0–44)
ANION GAP: 12 (ref 5–15)
AST: 17 U/L (ref 15–41)
Albumin: 3.6 g/dL (ref 3.5–5.0)
Alkaline Phosphatase: 99 U/L (ref 38–126)
BILIRUBIN TOTAL: 0.2 mg/dL — AB (ref 0.3–1.2)
BUN: 11 mg/dL (ref 6–20)
CO2: 22 mmol/L (ref 22–32)
Calcium: 8.3 mg/dL — ABNORMAL LOW (ref 8.9–10.3)
Chloride: 108 mmol/L (ref 98–111)
Creatinine, Ser: 1.09 mg/dL (ref 0.61–1.24)
GFR calc Af Amer: >60 mL/min (ref >60)
Glucose, Bld: 95 mg/dL (ref 70–99)
POTASSIUM: 3.7 mmol/L (ref 3.5–5.1)
Sodium: 142 mmol/L (ref 135–145)
TOTAL PROTEIN: 5.6 g/dL — AB (ref 6.5–8.1)

## 2018-01-10 LAB — ETHANOL: Alcohol, Ethyl (B): 261 mg/dL — ABNORMAL HIGH (ref <10)

## 2018-01-10 LAB — CBC WITH DIFFERENTIAL/PLATELET
Abs Immature Granulocytes: 0 10*3/uL (ref 0.0–0.1)
Basophils Absolute: 0 10*3/uL (ref 0.0–0.1)
Basophils Relative: 0 %
EOS ABS: 0.1 10*3/uL (ref 0.0–0.7)
EOS PCT: 2 %
HEMATOCRIT: 40.7 % (ref 39.0–52.0)
Hemoglobin: 13.5 g/dL (ref 13.0–17.0)
Immature Granulocytes: 0 %
LYMPHS ABS: 2.5 10*3/uL (ref 0.7–4.0)
Lymphocytes Relative: 33 %
MCH: 31.5 pg (ref 26.0–34.0)
MCHC: 33.2 g/dL (ref 30.0–36.0)
MCV: 95.1 fL (ref 78.0–100.0)
MONOS PCT: 9 %
Monocytes Absolute: 0.7 10*3/uL (ref 0.1–1.0)
Neutro Abs: 4.3 10*3/uL (ref 1.7–7.7)
Neutrophils Relative %: 56 %
Platelets: 257 10*3/uL (ref 150–400)
RBC: 4.28 MIL/uL (ref 4.22–5.81)
RDW: 13.7 % (ref 11.5–15.5)
WBC: 7.6 10*3/uL (ref 4.0–10.5)

## 2018-01-10 LAB — URINALYSIS, ROUTINE W REFLEX MICROSCOPIC
BILIRUBIN URINE: NEGATIVE
Bacteria, UA: NONE SEEN
GLUCOSE, UA: NEGATIVE mg/dL
Hgb urine dipstick: NEGATIVE
KETONES UR: NEGATIVE mg/dL
Leukocytes, UA: NEGATIVE
NITRITE: NEGATIVE
PH: 6 (ref 5.0–8.0)
Protein, ur: NEGATIVE mg/dL
Specific Gravity, Urine: 1.003 — ABNORMAL LOW (ref 1.005–1.030)

## 2018-01-10 LAB — TROPONIN I
Troponin I: 0.03 ng/mL (ref <0.03)
Troponin I: <0.03 ng/mL (ref <0.03)
Troponin I: <0.03 ng/mL (ref <0.03)

## 2018-01-10 LAB — I-STAT CG4 LACTIC ACID, ED: Lactic Acid, Venous: 1.83 mmol/L (ref 0.5–1.9)

## 2018-01-10 LAB — RAPID URINE DRUG SCREEN, HOSP PERFORMED
Amphetamines: NOT DETECTED
Barbiturates: NOT DETECTED
Benzodiazepines: NOT DETECTED
Cocaine: POSITIVE — AB
Opiates: NOT DETECTED
TETRAHYDROCANNABINOL: NOT DETECTED

## 2018-01-10 LAB — CK: CK TOTAL: 113 U/L (ref 49–397)

## 2018-01-10 LAB — SALICYLATE LEVEL: Salicylate Lvl: <7 mg/dL (ref 2.8–30.0)

## 2018-01-10 LAB — ACETAMINOPHEN LEVEL

## 2018-01-10 LAB — TSH: TSH: 2.166 u[IU]/mL (ref 0.350–4.500)

## 2018-01-10 LAB — T4, FREE: FREE T4: 1.09 ng/dL (ref 0.82–1.77)

## 2018-01-10 MED ORDER — LORAZEPAM 2 MG/ML IJ SOLN
0.0000 mg | Freq: Four times a day (QID) | INTRAMUSCULAR | Status: DC
  Administered 2018-01-10: 4 mg via INTRAVENOUS

## 2018-01-10 MED ORDER — THIAMINE HCL 100 MG/ML IJ SOLN
100.0000 mg | Freq: Every day | INTRAMUSCULAR | Status: DC
  Filled 2018-01-10: qty 2

## 2018-01-10 MED ORDER — LOPERAMIDE HCL 2 MG PO CAPS: 2.0000 mg | ORAL_CAPSULE | ORAL | Status: DC | PRN

## 2018-01-10 MED ORDER — LORAZEPAM 1 MG PO TABS: 0.0000 mg | ORAL_TABLET | Freq: Four times a day (QID) | ORAL | Status: DC

## 2018-01-10 MED ORDER — IOHEXOL 300 MG/ML  SOLN
75.0000 mL | Freq: Once | INTRAMUSCULAR | Status: AC | PRN
  Administered 2018-01-10: 75 mL via INTRAVENOUS

## 2018-01-10 MED ORDER — ADULT MULTIVITAMIN W/MINERALS CH
1.0000 | ORAL_TABLET | Freq: Every day | ORAL | Status: DC
  Administered 2018-01-11: 1 via ORAL
  Filled 2018-01-10: qty 1

## 2018-01-10 MED ORDER — ONDANSETRON 4 MG PO TBDP: 4.0000 mg | ORAL_TABLET | Freq: Four times a day (QID) | ORAL | Status: DC | PRN

## 2018-01-10 MED ORDER — CEPHALEXIN 500 MG PO CAPS: 500.0000 mg | ORAL_CAPSULE | Freq: Three times a day (TID) | ORAL | 0 refills | Status: DC

## 2018-01-10 MED ORDER — CHLORDIAZEPOXIDE HCL 25 MG PO CAPS
50.0000 mg | ORAL_CAPSULE | Freq: Three times a day (TID) | ORAL | Status: DC
  Administered 2018-01-10 – 2018-01-11 (×2): 50 mg via ORAL
  Filled 2018-01-10 (×2): qty 2

## 2018-01-10 MED ORDER — HYDROXYZINE HCL 25 MG PO TABS: 25.0000 mg | ORAL_TABLET | Freq: Four times a day (QID) | ORAL | Status: DC | PRN

## 2018-01-10 MED ORDER — VITAMIN B-1 100 MG PO TABS
100.0000 mg | ORAL_TABLET | Freq: Every day | ORAL | Status: DC
  Administered 2018-01-10 – 2018-01-11 (×2): 100 mg via ORAL
  Filled 2018-01-10 (×2): qty 1

## 2018-01-10 MED ORDER — LORAZEPAM 1 MG PO TABS
1.0000 mg | ORAL_TABLET | Freq: Four times a day (QID) | ORAL | Status: DC | PRN
  Administered 2018-01-10 – 2018-01-11 (×3): 1 mg via ORAL
  Filled 2018-01-10 (×3): qty 1

## 2018-01-10 MED ORDER — METHOCARBAMOL 500 MG PO TABS
500.0000 mg | ORAL_TABLET | Freq: Three times a day (TID) | ORAL | Status: DC | PRN
  Administered 2018-01-10: 500 mg via ORAL
  Filled 2018-01-10: qty 1

## 2018-01-10 MED ORDER — CLONIDINE HCL 0.1 MG PO TABS: 0.1000 mg | ORAL_TABLET | ORAL | Status: DC

## 2018-01-10 MED ORDER — NAPROXEN 250 MG PO TABS
500.0000 mg | ORAL_TABLET | Freq: Two times a day (BID) | ORAL | Status: DC | PRN
  Filled 2018-01-10: qty 2

## 2018-01-10 MED ORDER — LEVOTHYROXINE SODIUM 112 MCG PO TABS
112.0000 ug | ORAL_TABLET | Freq: Every day | ORAL | Status: DC
  Administered 2018-01-11: 112 ug via ORAL
  Filled 2018-01-10: qty 1

## 2018-01-10 MED ORDER — ENOXAPARIN SODIUM 40 MG/0.4ML ~~LOC~~ SOLN
40.0000 mg | SUBCUTANEOUS | Status: DC
  Administered 2018-01-10 – 2018-01-11 (×2): 40 mg via SUBCUTANEOUS
  Filled 2018-01-10 (×3): qty 0.4

## 2018-01-10 MED ORDER — LORAZEPAM 1 MG PO TABS: 0.0000 mg | ORAL_TABLET | Freq: Two times a day (BID) | ORAL | Status: DC

## 2018-01-10 MED ORDER — CLONIDINE HCL 0.1 MG PO TABS
0.1000 mg | ORAL_TABLET | Freq: Four times a day (QID) | ORAL | Status: DC
  Administered 2018-01-10 – 2018-01-11 (×4): 0.1 mg via ORAL
  Filled 2018-01-10 (×4): qty 1

## 2018-01-10 MED ORDER — LORAZEPAM 2 MG/ML IJ SOLN
INTRAMUSCULAR | Status: AC
  Filled 2018-01-10: qty 2

## 2018-01-10 MED ORDER — OXYCODONE-ACETAMINOPHEN 5-325 MG PO TABS: 1.0000 | ORAL_TABLET | Freq: Four times a day (QID) | ORAL | Status: DC | PRN

## 2018-01-10 MED ORDER — DICYCLOMINE HCL 20 MG PO TABS
20.0000 mg | ORAL_TABLET | Freq: Four times a day (QID) | ORAL | Status: DC | PRN
  Filled 2018-01-10: qty 1

## 2018-01-10 MED ORDER — LORAZEPAM 2 MG/ML IJ SOLN
1.0000 mg | Freq: Four times a day (QID) | INTRAMUSCULAR | Status: DC | PRN
  Administered 2018-01-11: 1 mg via INTRAVENOUS
  Filled 2018-01-10: qty 1

## 2018-01-10 MED ORDER — FOLIC ACID 1 MG PO TABS
1.0000 mg | ORAL_TABLET | Freq: Every day | ORAL | Status: DC
  Administered 2018-01-11: 1 mg via ORAL
  Filled 2018-01-10: qty 1

## 2018-01-10 MED ORDER — CLONIDINE HCL 0.1 MG PO TABS: 0.1000 mg | ORAL_TABLET | Freq: Every day | ORAL | Status: DC

## 2018-01-10 NOTE — ED Notes (Signed)
Pt denies SI/HI 
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
## 2018-01-10 NOTE — BH Assessment (Signed)
Paul Bray Assessment Progress Note   TTS attempted to see patient, but he has been sedated with Geodon and is unable to be seen currently.

## 2018-01-10 NOTE — ED Provider Notes (Signed)
  Physical Exam  BP 108/73   Pulse 94   Temp 98.5 F (36.9 C) (Oral)   Resp 13   Ht 6' (1.829 m)   Wt 83 kg   SpO2 100%   BMI 24.82 kg/m   Physical Exam  ED Course/Procedures     Procedures  MDM  Received patient in signout with a plan to see TTS.       Davonna Belling, MD 01/10/18 1044

## 2018-01-10 NOTE — ED Notes (Signed)
Pt is awake, exteremly diaphoretic, shaky, states he is in heroin and etoh withdrawal- Margarita Mail, PA notified.
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
## 2018-01-11 ENCOUNTER — Inpatient Hospital Stay (HOSPITAL_COMMUNITY): Payer: Medicare HMO

## 2018-01-11 DIAGNOSIS — R21 Rash and other nonspecific skin eruption: Secondary | ICD-10-CM

## 2018-01-11 DIAGNOSIS — F1123 Opioid dependence with withdrawal: Secondary | ICD-10-CM

## 2018-01-11 DIAGNOSIS — X58XXXD Exposure to other specified factors, subsequent encounter: Secondary | ICD-10-CM

## 2018-01-11 DIAGNOSIS — M79671 Pain in right foot: Secondary | ICD-10-CM

## 2018-01-11 DIAGNOSIS — F10239 Alcohol dependence with withdrawal, unspecified: Principal | ICD-10-CM

## 2018-01-11 DIAGNOSIS — L539 Erythematous condition, unspecified: Secondary | ICD-10-CM

## 2018-01-11 DIAGNOSIS — S022XXD Fracture of nasal bones, subsequent encounter for fracture with routine healing: Secondary | ICD-10-CM

## 2018-01-11 DIAGNOSIS — S022XXA Fracture of nasal bones, initial encounter for closed fracture: Secondary | ICD-10-CM | POA: Diagnosis present

## 2018-01-11 DIAGNOSIS — Z5321 Procedure and treatment not carried out due to patient leaving prior to being seen by health care provider: Secondary | ICD-10-CM

## 2018-01-11 LAB — BASIC METABOLIC PANEL
Anion gap: 10 (ref 5–15)
BUN: 10 mg/dL (ref 6–20)
CALCIUM: 8.5 mg/dL — AB (ref 8.9–10.3)
CO2: 27 mmol/L (ref 22–32)
CREATININE: 1 mg/dL (ref 0.61–1.24)
Chloride: 103 mmol/L (ref 98–111)
GFR calc Af Amer: >60 mL/min (ref >60)
Glucose, Bld: 96 mg/dL (ref 70–99)
Potassium: 3.7 mmol/L (ref 3.5–5.1)
Sodium: 140 mmol/L (ref 135–145)

## 2018-01-11 LAB — HIV ANTIBODY (ROUTINE TESTING W REFLEX): HIV SCREEN 4TH GENERATION: NONREACTIVE

## 2018-01-11 MED ORDER — BUPRENORPHINE HCL-NALOXONE HCL 2-0.5 MG SL SUBL
2.0000 | SUBLINGUAL_TABLET | Freq: Once | SUBLINGUAL | Status: AC
  Administered 2018-01-11: 2 via SUBLINGUAL
  Filled 2018-01-11: qty 2

## 2018-01-11 MED ORDER — LORAZEPAM 2 MG/ML IJ SOLN: 2.0000 mg | Freq: Four times a day (QID) | INTRAMUSCULAR | Status: DC | PRN

## 2018-01-11 MED ORDER — DICLOFENAC SODIUM 1 % TD GEL
2.0000 g | Freq: Four times a day (QID) | TRANSDERMAL | Status: DC
  Filled 2018-01-11: qty 100

## 2018-01-11 MED ORDER — LORAZEPAM 1 MG PO TABS: 2.0000 mg | ORAL_TABLET | ORAL | Status: DC | PRN

## 2018-01-11 MED ORDER — NICOTINE POLACRILEX 2 MG MT GUM: 2.0000 mg | CHEWING_GUM | OROMUCOSAL | Status: DC | PRN

## 2018-01-11 MED ORDER — CHLORDIAZEPOXIDE HCL 25 MG PO CAPS: 50.0000 mg | ORAL_CAPSULE | Freq: Two times a day (BID) | ORAL | Status: DC

## 2018-01-11 MED ORDER — LORAZEPAM 1 MG PO TABS: 2.0000 mg | ORAL_TABLET | Freq: Four times a day (QID) | ORAL | Status: DC | PRN

## 2018-01-11 MED ORDER — LORAZEPAM 2 MG/ML IJ SOLN: 2.0000 mg | INTRAMUSCULAR | Status: DC | PRN

## 2018-01-11 MED ORDER — CHLORDIAZEPOXIDE HCL 25 MG PO CAPS: 50.0000 mg | ORAL_CAPSULE | Freq: Three times a day (TID) | ORAL | Status: DC

## 2018-01-11 MED ORDER — BUPRENORPHINE HCL-NALOXONE HCL 8-2 MG SL SUBL: 1.0000 | SUBLINGUAL_TABLET | Freq: Every day | SUBLINGUAL | Status: DC

## 2018-01-11 MED ORDER — NICOTINE POLACRILEX 4 MG MT LOZG
4.0000 mg | LOZENGE | OROMUCOSAL | Status: DC | PRN
  Filled 2018-01-11: qty 1

## 2018-01-11 MED ORDER — CLOBETASOL PROPIONATE 0.05 % EX OINT
TOPICAL_OINTMENT | Freq: Two times a day (BID) | CUTANEOUS | Status: DC
  Filled 2018-01-11: qty 15

## 2018-01-11 NOTE — Progress Notes (Signed)
Paged about Paul Bray becoming increasingly agitated by nurse. He is asking to leave temporarily to smoke outside as well as to go to his suboxone clinic tomorrow morning. He states that his friend is coming to pick him up and he would like to go outside to speak with him. Explained to the patient that he is receiving his suboxone in the hospital and there is no need to go the clinic. Informed the patient that we have nicorette gum, lozenges and patch available. States he would be amenable on gum and lozenges. Also mentioned that he is having significant irritation of his left armpit and he is having significant pain of his right foot.  On physical examination, he was endorsing tremors and irritability. Had erythematous area around first metatarsophalangeal joint with pain on active and passive flexion and eczematous rash around his left arm pit.   Informed the patient that we will provide supportive measures in the form of Voltaren gel and clobetasol ointment as well as increasing his Librium dose. Patient expressed understanding.  Stepped outside the room to speak with pharmacy and to put in orders. Within 15 minutes was informed by nurse that patient AMAed.
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
## 2018-01-11 NOTE — Progress Notes (Signed)
CSW was consulted by [facility] for transportation needs, shelter information and food resources.  CSW informed pt that the [facility] could assist him with finding housing.  CSW placed shelter information and food resources list on pt's tray at bedside. Pt said that he can only make 1 call and after then it becomes difficult for him to call other places.  CSW suggested to pt that he reach out to other places while he is in the hospital since he has a phone here.  Pt acknowledges that he can makes calls from hospital to seek housing.  Pt also mention that he receives disability and can use that for housing when he finds a place.    Reed Breech LCSWA [phone]

## 2018-01-11 NOTE — Progress Notes (Signed)
Internal Medicine Attending:   I saw and examined the patient. I reviewed the resident's note and I agree with the resident's findings and plan as documented in the resident's note.  Principal Problem:   Alcohol use disorder, severe, dependence ([place]) Active Problems:   Opioid use with withdrawal ([place])   Nose fracture  Hospital day #2 with alcohol use disorder and high risk withdrawal, doing well on librium and as needed ativan. No hallucinosis, low scores today. Symptom burden from opioid withdrawal was the biggest issue this morning. We have started suboxone and those withdrawal symptoms are now resolved. We are going to consult with ENT regarding the right nasal bone fracture. Also consult with [facility] to see if he qualifies for inpatient treatment of severe alcohol use disorder.   Lalla Brothers, MD

## 2018-01-11 NOTE — Progress Notes (Signed)
Paged due to Mr. Spahr having pain in his right foot with walking. Nurse stated he was also limping when trying to walk to the rest room. On exam there was mild focal swelling and erythema on the medial aspect of the right foot near proximal 1st phalanx. Area was tender to palpation. Patient stated he did not know how he hurt his foot. His pain is currently controlled.   - complete right foot xray ordered    Ronel Rodeheaver A, DO 01/11/2018, 12:13 AM Pager: [phone]

## 2018-01-11 NOTE — Progress Notes (Signed)
Pt wanted wallet to be given to security. Done and Yellow copy in chart

## 2018-01-11 NOTE — Progress Notes (Signed)
RN paged MD after pt had increasing agitation and a CIWA score of 13 at 1800. Pt had multiple concerns and questions for the MD, MD spoke with pt and answered questions. After MD left the floor pt became even more agitated and demanded we sign him out AMA. RN advised pt about leaving AMA and that we did not recommend that he leave the hospital. MD notified, pt removed his IV and walked off the unit.   Riley Kill RN

## 2018-01-12 LAB — HCV COMMENT:

## 2018-01-12 LAB — HEPATITIS C ANTIBODY (REFLEX)

## 2018-01-15 LAB — CULTURE, BLOOD (ROUTINE X 2)
CULTURE: NO GROWTH
CULTURE: NO GROWTH
Special Requests: ADEQUATE
Special Requests: ADEQUATE

## 2018-01-16 NOTE — Discharge Summary (Signed)
Name: Paul Bray MRN: [id number] DOB: 1966/03/15 52 y.o. PCP: Tamsen Roers, MD  Date of Admission: 01/09/2018 11:26 PM Date of Discharge: 01/11/2018 Attending Physician: Axel Filler, MD  Discharge Diagnosis: 1. Alcohol use disorder with acute withdrawal - high risk due to history of withdrawal seizures 2. Opiate withdrawal    Discharge Medications: Allergies as of 01/11/2018   No Known Allergies     Medication List    ASK your doctor about these medications   acetaminophen 500 MG tablet Commonly known as:  TYLENOL Take 2,000 mg by mouth 2 (two) times daily as needed for headache (pain).   cephALEXin 500 MG capsule Commonly known as:  KEFLEX Take 500 mg by mouth 3 (three) times daily. Ask about: Which instructions should I use?   cephALEXin 500 MG capsule Commonly known as:  KEFLEX Take 1 capsule (500 mg total) by mouth 3 (three) times daily. Ask about: Which instructions should I use?   levothyroxine 112 MCG tablet Commonly known as:  SYNTHROID, LEVOTHROID Take 112 mcg by mouth daily before breakfast.   methocarbamol 500 MG tablet Commonly known as:  ROBAXIN Take 1 tablet (500 mg total) by mouth 2 (two) times daily.   multivitamin with minerals Tabs tablet Take 1 tablet by mouth daily.   promethazine 25 MG tablet Commonly known as:  PHENERGAN Take 1 tablet (25 mg total) by mouth every 6 (six) hours as needed for nausea or vomiting.   propranolol 10 MG tablet Commonly known as:  INDERAL Take 10 mg by mouth See admin instructions. Take 1/2 tablet (10 mg) by mouth 2-3 times daily as needed for anxiety   VITAMIN D PO Take 1 tablet by mouth daily.       Disposition and follow-up:   [name] was admitted to [facility] at request for help with alcohol and opiate withdrawal. On HD#2 of treatment, he left AMA at approximately 19:15.   Follow-up Appointments: Follow-up Information    Little, James, MD. Schedule an appointment as  soon as possible for a visit in 2 days.   Specialty:  Family Medicine Contact information: [address] [place] [address] [phone]        Jodi Marble, MD. Schedule an appointment as soon as possible for a visit in 2 days.   Specialty:  Otolaryngology Contact information: [address] [place] [address] [phone]           Hospital Course by problem list: 1. Alcohol use disorder with acute withdrawal: high risk for seizures based on history. Mr. Pautsch was placed on CIWA protocol with ativan, as well as Librium taper of 50 mg tid.   2. Opiate withdrawal: UDS in the ED was positive for cocaine. Patient admitted to daily use of heroine pre-mixed with fentanyl. His last heroin use was earlier in the day on day of admission. The following morning, he was complaining of opioid withdrawal symptoms, including agitation, abdominal pain, nausea. He elected to start suboxone due to ease of outpatient prescribing. He received 2 doses of Suboxone 4 mg which he tolerated well and showed good clinical improvement in withdrawal symptoms. Plan was to start 8 mg the following morning, but patient left AMA that evening.   3. Nasal bone fracture on maxillofacial CT: mildly depressed. He was symptomatically stable. ENT was consulted to see if any acute treatment was needed, but patient left prior to receiving their recommendations.   4. Right metatarsal pain: no evidence  of fracture or dislocation on x-ray. Etiology likely soft tissue injury vs crystal arthropathy. We treated with Naproxen 500 mg bid for symptomatic control.   Care planning: Consults were placed to behavior health and social work because patient strongly desired placement in residential substance use treatment facility. Unfortunately, he left before any arrangements could be made.   Discharge Vitals:   BP (!) 119/96 (BP Location: Right Arm)   Pulse 90   Temp 98.1 F (36.7 C) (Oral)   Resp 18   Ht 6'  (1.829 m)   Wt 80.7 kg   SpO2 98%   BMI 24.13 kg/m    Signed: Delice Bison, DO 01/16/2018, 9:08 PM   Pager: [phone]

## 2018-01-17 ENCOUNTER — Emergency Department (HOSPITAL_COMMUNITY)
Admission: EM | Admit: 2018-01-17 | Discharge: 2018-01-17 | Disposition: A | Discharge status: Home / Self Care | Payer: Medicare HMO | Attending: Emergency Medicine | Admitting: Emergency Medicine

## 2018-01-17 ENCOUNTER — Encounter (HOSPITAL_COMMUNITY): Payer: Self-pay | Admitting: Emergency Medicine

## 2018-01-17 ENCOUNTER — Other Ambulatory Visit: Payer: Self-pay

## 2018-01-17 ENCOUNTER — Emergency Department (HOSPITAL_COMMUNITY)
Admission: EM | Admit: 2018-01-17 | Discharge: 2018-01-17 | Disposition: A | Discharge status: Home / Self Care | Payer: Medicare HMO | Source: Home / Self Care | Attending: Emergency Medicine | Admitting: Emergency Medicine

## 2018-01-17 DIAGNOSIS — F17210 Nicotine dependence, cigarettes, uncomplicated: Secondary | ICD-10-CM | POA: Insufficient documentation

## 2018-01-17 DIAGNOSIS — F1994 Other psychoactive substance use, unspecified with psychoactive substance-induced mood disorder: Secondary | ICD-10-CM | POA: Insufficient documentation

## 2018-01-17 DIAGNOSIS — E039 Hypothyroidism, unspecified: Secondary | ICD-10-CM

## 2018-01-17 DIAGNOSIS — Z5321 Procedure and treatment not carried out due to patient leaving prior to being seen by health care provider: Secondary | ICD-10-CM | POA: Insufficient documentation

## 2018-01-17 DIAGNOSIS — F10929 Alcohol use, unspecified with intoxication, unspecified: Secondary | ICD-10-CM | POA: Insufficient documentation

## 2018-01-17 LAB — COMPREHENSIVE METABOLIC PANEL
ALBUMIN: 3.8 g/dL (ref 3.5–5.0)
ALK PHOS: 72 U/L (ref 38–126)
ALT: 17 U/L (ref 0–44)
ANION GAP: 11 (ref 5–15)
AST: 19 U/L (ref 15–41)
BUN: 8 mg/dL (ref 6–20)
CO2: 28 mmol/L (ref 22–32)
Calcium: 8.9 mg/dL (ref 8.9–10.3)
Chloride: 107 mmol/L (ref 98–111)
Creatinine, Ser: 0.99 mg/dL (ref 0.61–1.24)
GFR calc Af Amer: >60 mL/min (ref >60)
GFR calc non Af Amer: >60 mL/min (ref >60)
GLUCOSE: 93 mg/dL (ref 70–99)
POTASSIUM: 4 mmol/L (ref 3.5–5.1)
SODIUM: 146 mmol/L — AB (ref 135–145)
Total Bilirubin: 0.4 mg/dL (ref 0.3–1.2)
Total Protein: 6.9 g/dL (ref 6.5–8.1)

## 2018-01-17 LAB — CBC WITH DIFFERENTIAL/PLATELET
Basophils Absolute: 0 10*3/uL (ref 0.0–0.1)
Basophils Relative: 0 %
Eosinophils Absolute: 0.1 10*3/uL (ref 0.0–0.7)
Eosinophils Relative: 1 %
HEMATOCRIT: 43.6 % (ref 39.0–52.0)
HEMOGLOBIN: 15 g/dL (ref 13.0–17.0)
Lymphocytes Relative: 37 %
Lymphs Abs: 1.9 10*3/uL (ref 0.7–4.0)
MCH: 32.3 pg (ref 26.0–34.0)
MCHC: 34.4 g/dL (ref 30.0–36.0)
MCV: 94 fL (ref 78.0–100.0)
MONOS PCT: 7 %
Monocytes Absolute: 0.4 10*3/uL (ref 0.1–1.0)
NEUTROS ABS: 2.8 10*3/uL (ref 1.7–7.7)
NEUTROS PCT: 55 %
Platelets: 320 10*3/uL (ref 150–400)
RBC: 4.64 MIL/uL (ref 4.22–5.81)
RDW: 14.2 % (ref 11.5–15.5)
WBC: 5.2 10*3/uL (ref 4.0–10.5)

## 2018-01-17 LAB — RAPID URINE DRUG SCREEN, HOSP PERFORMED
AMPHETAMINES: NOT DETECTED
BARBITURATES: NOT DETECTED
BENZODIAZEPINES: POSITIVE — AB
COCAINE: POSITIVE — AB
Opiates: NOT DETECTED
TETRAHYDROCANNABINOL: NOT DETECTED

## 2018-01-17 LAB — ETHANOL: Alcohol, Ethyl (B): 41 mg/dL — ABNORMAL HIGH (ref <10)

## 2018-01-17 MED ORDER — LOPERAMIDE HCL 2 MG PO CAPS: 2.0000 mg | ORAL_CAPSULE | ORAL | Status: DC | PRN

## 2018-01-17 MED ORDER — METHOCARBAMOL 500 MG PO TABS
500.0000 mg | ORAL_TABLET | Freq: Three times a day (TID) | ORAL | Status: DC | PRN
  Administered 2018-01-17: 500 mg via ORAL
  Filled 2018-01-17: qty 1

## 2018-01-17 MED ORDER — THIAMINE HCL 100 MG/ML IJ SOLN
Freq: Once | INTRAVENOUS | Status: AC
  Administered 2018-01-17: 09:00:00 via INTRAVENOUS
  Filled 2018-01-17: qty 1000

## 2018-01-17 MED ORDER — NAPROXEN 500 MG PO TABS
500.0000 mg | ORAL_TABLET | Freq: Two times a day (BID) | ORAL | Status: DC | PRN
  Administered 2018-01-17: 500 mg via ORAL
  Filled 2018-01-17: qty 1

## 2018-01-17 MED ORDER — LORAZEPAM 2 MG/ML IJ SOLN: 0.0000 mg | Freq: Four times a day (QID) | INTRAMUSCULAR | Status: DC

## 2018-01-17 MED ORDER — LORAZEPAM 2 MG/ML IJ SOLN: 0.0000 mg | Freq: Two times a day (BID) | INTRAMUSCULAR | Status: DC

## 2018-01-17 MED ORDER — DICYCLOMINE HCL 20 MG PO TABS
20.0000 mg | ORAL_TABLET | Freq: Four times a day (QID) | ORAL | Status: DC | PRN
  Administered 2018-01-17: 20 mg via ORAL
  Filled 2018-01-17: qty 1

## 2018-01-17 MED ORDER — HYDROXYZINE HCL 25 MG PO TABS: 25.0000 mg | ORAL_TABLET | Freq: Four times a day (QID) | ORAL | Status: DC | PRN

## 2018-01-17 MED ORDER — ONDANSETRON 4 MG PO TBDP
4.0000 mg | ORAL_TABLET | Freq: Four times a day (QID) | ORAL | Status: DC | PRN
  Administered 2018-01-17: 4 mg via ORAL
  Filled 2018-01-17: qty 1

## 2018-01-17 MED ORDER — LORAZEPAM 1 MG PO TABS
0.0000 mg | ORAL_TABLET | Freq: Four times a day (QID) | ORAL | Status: DC
  Administered 2018-01-17: 1 mg via ORAL
  Filled 2018-01-17: qty 1

## 2018-01-17 MED ORDER — LORAZEPAM 1 MG PO TABS: 0.0000 mg | ORAL_TABLET | Freq: Two times a day (BID) | ORAL | Status: DC

## 2018-01-17 NOTE — ED Triage Notes (Addendum)
Pt arriving via [facility]. Pt called 911 from gas station after being escorted from property by security for aggressive behaviors towards staff. Pt told EMS he did not want to go back to [facility] and that he would just walk out and call 911 again to go to [facility]. EMS reported pt making threatening comments towards them prior to arrival to facility.

## 2018-01-17 NOTE — ED Triage Notes (Signed)
Pt arriving via [facility] for intoxication. [employer] found pt yelling on the side of the street demanding detox. Pt seen at [facility] for same yesterday.

## 2018-01-17 NOTE — BH Assessment (Signed)
[place] Assessment Progress Note 01100 hours this writer attempted to assess patient unsuccessfully due to patient be impaired and unable to participate in the assessment. Patient will be assessed later this date.

## 2018-01-17 NOTE — ED Notes (Signed)
Pt came into triage saying, "I want my shit back. I'm not going to sit in the lobby all night to wait for my blood alcohol to go down. I might as well leave." Pt's belongings were given back, and he was escorted out of the building.

## 2018-01-17 NOTE — ED Notes (Signed)
PT AGREED WITH PLAN OF CARE.

## 2018-01-17 NOTE — ED Notes (Signed)
DAVE TTS MADE AWARE PT CONTINUES TO STATE HE HAS A BED AT [facility]. DAVE TTS STATES HE WILL SEE PT IN A FEW MINUTES. THIS WRITER STATED WOULD PEER SUPPORT BE ADVISED FOR THIS PT. TTS DAVE STATES HE WOULD SEE PT. PT DID NOT SAY ANYTHING TO TTS DURING HIS FIRST ASSESSMENT ABOUT [facility] PLACEMENT.

## 2018-01-17 NOTE — Discharge Instructions (Signed)
For your mental health needs, please follow up at: ° °[facility] °[address] °[place], [place] [address] °[phone] ° °

## 2018-01-17 NOTE — ED Notes (Signed)
PT DECLINES LUNCH MEAL

## 2018-01-17 NOTE — ED Notes (Signed)
SECURITY ASSISTED WITHOUT EVENT

## 2018-01-17 NOTE — ED Notes (Signed)
PT MADE AWARE OF DISCHARGE. PT STATES TO BE DISCHARGED TO [facility]. PSYCH MD AWARE. TTS DAVID WILL BE NOTIFIED BY PSYCH MD OF PT'S CURRENT STATUS

## 2018-01-17 NOTE — ED Notes (Signed)
TTS DAVE NOTIFIED [facility]- NO BED VERIFIED. PT JUST SEEN AT [facility] ON Saturday. PT WILL BE GIVEN PEER SUPPORT CONTACT AT DISCHARGE. TTS DAVE AT BEDSIDE TO DISCUSS DISCHARGE PLAN OF CARE.  PT PULLED IV OUT. PT NOW STATES HE IS SUICIDAL. " I WILL RUN INTO TRAFFIC" TTS DAVE MADE AWARE. EDP NANAVATI MADE AWARE. PER TTS DAVE AND EDP NANAVATI PT HAS BEEN MEDICAL AND PSYCH CLEARED. PT WILL BE DISCHARGED WITH RESOURCES. TTS DAVE WITH ASSIST WITH DISCHARGE.

## 2018-01-17 NOTE — ED Notes (Signed)
EDPA AT BEDSIDE 

## 2018-01-17 NOTE — BH Assessment (Signed)
Assessment Note  Paul Bray is an 52 y.o. male that presents this date requesting assistance with alcohol issues. Patient denies any S/I,H/I or AVH. Patient has a history of substance abuse, bipolar disorder, brain tumor who presents to the Emergency Department for alcohol intoxication, requesting detox. Patient states that he does not know how much he drinks daily, "enough to stay drunk all day". He last drank yesterday. Complaining of withdrawals to include abdominal cramping and nausea. Patient reports he has a history of depression with symptoms to include: feeling worthless and guilt. Patient denies being on any MH medications or having a current OP provider. Patient states he "is not interested in that type of care." Patient is observed to be agitated and speaks in a loud voice rendering limited history. Per record review patient was last seen at [facility] on 08/03/17 presenting with similar symptoms. Patient states that he has never felt suicidal before, but last night and today, he just doesn't want to live anymore although denies any S/I to this Probation officer. Patient renders conflicting history throughout the assessment and becomes agitated at this Probation officer. Patient denies any previous attempts/gestures at self harm. No HI, A/V hallucinations. He would like to go to [facility], but states that he needs detox before going there. Patient is stating he has a bed at [facility] although this Probation officer contacted that provider who informed that he has not applied for the admission process. Case was staffed with Jyl Heinz, Romilda Garret FNP who recommended patient be discharged with OP resources. Patient will also be provided with [facility]'s contact information to apply for admission. Patient will also be advised that peer support will contact patient to assist with admission process if needed.    Diagnosis: MDD recurrent without psychotic features, severe Alcohol abuse  Past Medical History:  Past Medical History:  Diagnosis Date  . Brain  tumor ([place])   . Substance abuse ([place])     No past surgical history on file.  Family History: No family history on file.  Social History:  reports that he has been smoking cigarettes. He has never used smokeless tobacco. He reports that he drinks alcohol. He reports that he has current or past drug history. Drugs: Cocaine and IV.  Additional Social History:  Alcohol / Drug Use Pain Medications: see MAR Prescriptions: see MAR Over the Counter: see MAR History of alcohol / drug use?: Yes Longest period of sobriety (when/how long): Unknown Negative Consequences of Use: (Pt declines to answer) Withdrawal Symptoms: (Pt declines to answer) Substance #1 Name of Substance 1: Alcohol 1 - Age of First Use: Unknown 1 - Amount (size/oz): Unknown 1 - Frequency: Unknown 1 - Duration: Unknown 1 - Last Use / Amount: Unknown BAL pending this date  CIWA: CIWA-Ar BP: 119/81 Pulse Rate: 78 Nausea and Vomiting: no nausea and no vomiting Tactile Disturbances: none Tremor: not visible, but can be felt fingertip to fingertip Auditory Disturbances: not present Paroxysmal Sweats: no sweat visible Visual Disturbances: not present Anxiety: no anxiety, at ease Headache, Fullness in Head: none present Agitation: normal activity Orientation and Clouding of Sensorium: oriented and can do serial additions CIWA-Ar Total: 1 COWS:    Allergies: No Known Allergies  Home Medications:  (Not in a hospital admission)  OB/GYN Status:  No LMP for male patient.  General Assessment Data Assessment unable to be completed: Yes Reason for not completing assessment: Pt is impaired sleeping Location of Assessment: [facility] ED TTS Assessment: In system Is this a Tele or Face-to-Face Assessment?: Face-to-Face Is  this an Initial Assessment or a Re-assessment for this encounter?: Initial Assessment Patient Accompanied by:: (NA) Language Other than English: No Living Arrangements: (Alone) What gender do you identify  as?: Male Marital status: Single Maiden name: NA Pregnancy Status: No Living Arrangements: Alone Can pt return to current living arrangement?: Yes Admission Status: Voluntary Is patient capable of signing voluntary admission?: Yes Referral Source: Self/Family/Friend Insurance type: Medicare  Medical Screening Exam ([place]) Medical Exam completed: Yes  Crisis Care Plan Living Arrangements: Alone Legal Guardian: (NA) Name of Psychiatrist: None Name of Therapist: None  Education Status Is patient currently in school?: No Is the patient employed, unemployed or receiving disability?: Unemployed  Risk to self with the past 6 months Suicidal Ideation: No Has patient been a risk to self within the past 6 months prior to admission? : No Suicidal Intent: No Has patient had any suicidal intent within the past 6 months prior to admission? : No Is patient at risk for suicide?: No Suicidal Plan?: No Has patient had any suicidal plan within the past 6 months prior to admission? : No Access to Means: No What has been your use of drugs/alcohol within the last 12 months?: Current use Previous Attempts/Gestures: No How many times?: 0 Other Self Harm Risks: NA Triggers for Past Attempts: Unknown Intentional Self Injurious Behavior: None Family Suicide History: No Recent stressful life event(s): (Increased SA use) Persecutory voices/beliefs?: No Depression: Yes Depression Symptoms: Feeling worthless/self pity Substance abuse history and/or treatment for substance abuse?: No Suicide prevention information given to non-admitted patients: Not applicable  Risk to Others within the past 6 months Homicidal Ideation: No Does patient have any lifetime risk of violence toward others beyond the six months prior to admission? : No Thoughts of Harm to Others: No Current Homicidal Intent: No Current Homicidal Plan: No Access to Homicidal Means: No Identified Victim: NA History of harm  to others?: No Assessment of Violence: None Noted Violent Behavior Description: NA Does patient have access to weapons?: No Criminal Charges Pending?: No Does patient have a court date: No Is patient on probation?: No  Psychosis Hallucinations: None noted Delusions: None noted  Mental Status Report Appearance/Hygiene: Unremarkable Eye Contact: Unable to Assess Motor Activity: Agitation Speech: Slow, Slurred Level of Consciousness: Drowsy Mood: Angry Affect: Anxious Anxiety Level: Moderate Thought Processes: Unable to Assess Judgement: Unable to Assess Orientation: Unable to assess Obsessive Compulsive Thoughts/Behaviors: Unable to Assess  Cognitive Functioning Concentration: Unable to Assess Memory: Unable to Assess Is patient IDD: No Insight: Poor Impulse Control: Unable to Assess Appetite: (UTA) Have you had any weight changes? : (UTA) Sleep: (UTA) Total Hours of Sleep: (UTA) Vegetative Symptoms: (UTA)  ADLScreening [facility] Assessment Services) Patient's cognitive ability adequate to safely complete daily activities?: Yes Patient able to express need for assistance with ADLs?: Yes Independently performs ADLs?: Yes (appropriate for developmental age)  Prior Inpatient Therapy Prior Inpatient Therapy: Yes Prior Therapy Dates: 2019 Prior Therapy Facilty/Provider(s): [facility] Reason for Treatment: MH issues  Prior Outpatient Therapy Prior Outpatient Therapy: No Does patient have an ACCT team?: No Does patient have Intensive In-House Services?  : No Does patient have [facility] services? : No Does patient have [facility] services?: No  ADL Screening (condition at time of admission) Patient's cognitive ability adequate to safely complete daily activities?: Yes Is the patient deaf or have difficulty hearing?: No Does the patient have difficulty seeing, even when wearing glasses/contacts?: No Does the patient have difficulty concentrating, remembering, or making decisions?:  No Patient  able to express need for assistance with ADLs?: Yes Does the patient have difficulty dressing or bathing?: No Independently performs ADLs?: Yes (appropriate for developmental age) Communication: Independent Is this a change from baseline?: Pre-admission baseline Dressing (OT): Independent Is this a change from baseline?: Pre-admission baseline Grooming: Independent Is this a change from baseline?: Pre-admission baseline Feeding: Independent Bathing: Independent Toileting: Independent In/Out Bed: Independent Walks in Home: Independent Does the patient have difficulty walking or climbing stairs?: No Weakness of Legs: Both Weakness of Arms/Hands: None  Home Assistive Devices/Equipment Home Assistive Devices/Equipment: None  Therapy Consults (therapy consults require a physician order) PT Evaluation Needed: No OT Evalulation Needed: No SLP Evaluation Needed: No Abuse/Neglect Assessment (Assessment to be complete while patient is alone) Physical Abuse: Denies Verbal Abuse: Denies Sexual Abuse: Denies Exploitation of patient/patient's resources: Denies Self-Neglect: Denies Values / Beliefs Cultural Requests During Hospitalization: None Spiritual Requests During Hospitalization: None Consults Spiritual Care Consult Needed: No Social Work Consult Needed: No Regulatory affairs officer (For Healthcare) Does Patient Have a Medical Advance Directive?: No Would patient like information on creating a medical advance directive?: No - Patient declined          Disposition: Case was staffed with Cheree Ditto FNP who recommended patient be discharged with OP resources. Patient will also be provided with [facility]'s contact information to apply for admission. Patient will also be advised that peer support will contact patient to assist with admission process if needed. Disposition Initial Assessment Completed for this Encounter: Yes Disposition of Patient: Discharge Patient refused  recommended treatment: No Mode of transportation if patient is discharged?: (Unk)  On Site Evaluation by:   Reviewed with Physician:    Mamie Nick 01/17/2018 1:36 PM

## 2018-01-17 NOTE — BH Assessment (Signed)
[place] Assessment Progress Note Case was staffed with Cheree Ditto FNP who recommended patient be discharged with OP resources. Patient will also be provided with [facility]'s contact information to apply for admission. Patient will also be advised that peer support will contact patient to assist with admission process if needed.

## 2018-01-17 NOTE — ED Provider Notes (Signed)
[facility] DEPT Provider Note   CSN: [id number] Arrival date & time: 01/17/18  0730     History   Chief Complaint Chief Complaint  Patient presents with  . Alcohol Intoxication    HPI Paul Bray is a 52 y.o. male.  The history is provided by the patient and medical records. No language interpreter was used.  Alcohol Intoxication  Associated symptoms include abdominal pain.   Paul Bray is a 52 y.o. male  with a PMH of substance abuse, bipolar disorder, brain tumor who presents to the Emergency Department for alcohol intoxication, requesting detox. Patient states that he does not know how much he drinks daily, "enough to stay drunk all day". He last drank yesterday. Complaining of generalized, intermittent abdominal cramping. No chest pain, shortness of breath, n/v, back pain. No falls. Denies head injury. Patient states that he has never felt suicidal before, but last night and today, he just doesn't want to live anymore. Denies known plan, just stating that he is on his last thread and has contemplated whether it was worth living or not. No HI, A/V hallucinations. He would like to go to [facility], but states that he needs detox before going there.  Past Medical History:  Diagnosis Date  . Brain tumor ([place])   . Substance abuse [facility])     Patient Active Problem List   Diagnosis Date Noted  . Nose fracture 01/11/2018  . Opioid use with withdrawal ([place]) 01/10/2018  . Hypothyroidism 01/10/2018  . Substance induced mood disorder ([place]) 08/04/2017  . Alcohol withdrawal delirium ([place]) 03/26/2017  . Alcohol use disorder, severe, dependence ([place]) 02/22/2017  . Bipolar and related disorder due to another medical condition with mixed features 01/20/2017  . Acquired hypothyroidism 01/17/2017  . 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      Home Medications    Prior to Admission  medications   Medication Sig Start Date End Date Taking? Authorizing Provider  acetaminophen (TYLENOL) 500 MG tablet Take 2,000 mg by mouth 2 (two) times daily as needed for headache (pain).     [provider]  cephALEXin (KEFLEX) 500 MG capsule Take 1 capsule (500 mg total) by mouth 3 (three) times daily. 01/10/18   Rancour, Annie Main, MD  cephALEXin (KEFLEX) 500 MG capsule Take 500 mg by mouth 3 (three) times daily.    [provider]  Cholecalciferol (VITAMIN D PO) Take 1 tablet by mouth daily.    [provider]  levothyroxine (SYNTHROID, LEVOTHROID) 112 MCG tablet Take 112 mcg by mouth daily before breakfast.    [provider]  methocarbamol (ROBAXIN) 500 MG tablet Take 1 tablet (500 mg total) by mouth 2 (two) times daily. Patient not taking: Reported on 01/10/2018 12/22/17   Lacretia Leigh, MD  Multiple Vitamin (MULTIVITAMIN WITH MINERALS) TABS tablet Take 1 tablet by mouth daily.    [provider]  promethazine (PHENERGAN) 25 MG tablet Take 1 tablet (25 mg total) by mouth every 6 (six) hours as needed for nausea or vomiting. Patient not taking: Reported on 01/10/2018 12/22/17   Lacretia Leigh, MD  propranolol (INDERAL) 10 MG tablet Take 10 mg by mouth See admin instructions. Take 1/2 tablet (10 mg) by mouth 2-3 times daily as needed for anxiety    [provider]    Family History No family history on file.  Social History Social History   Tobacco Use  . Smoking status: Current  Every Day Smoker    Types: Cigarettes  . Smokeless tobacco: Never Used  Substance Use Topics  . Alcohol use: Yes  . Drug use: Yes    Types: Cocaine, IV     Allergies   Patient has no known allergies.   Review of Systems Review of Systems  Gastrointestinal: Positive for abdominal pain. Negative for diarrhea, nausea and vomiting.  All other systems reviewed and are negative.    Physical Exam Updated Vital Signs BP 119/81   Pulse 78   Temp  97.8 F (36.6 C) (Oral)   Resp 18   Ht 6' (1.829 m)   Wt 80.7 kg   SpO2 98%   BMI 24.13 kg/m   Physical Exam  Constitutional: He is oriented to person, place, and time. He appears well-developed and well-nourished. No distress.  HENT:  Head: Normocephalic and atraumatic.  Cardiovascular: Normal rate, regular rhythm and normal heart sounds.  No murmur heard. Pulmonary/Chest: Effort normal and breath sounds normal. No respiratory distress.  Abdominal: Soft. He exhibits no distension.  No abdominal tenderness.  Musculoskeletal: He exhibits no edema.  Neurological: He is alert and oriented to person, place, and time.  Skin: Skin is warm and dry.  Nursing note and vitals reviewed.    ED Treatments / Results  Labs (all labs ordered are listed, but only abnormal results are displayed) Labs Reviewed  COMPREHENSIVE METABOLIC PANEL - Abnormal; Notable for the following components:      Result Value   Sodium 146 (*)    All other components within normal limits  ETHANOL - Abnormal; Notable for the following components:   Alcohol, Ethyl (B) 41 (*)    All other components within normal limits  RAPID URINE DRUG SCREEN, HOSP PERFORMED - Abnormal; Notable for the following components:   Cocaine POSITIVE (*)    Benzodiazepines POSITIVE (*)    All other components within normal limits  CBC WITH DIFFERENTIAL/PLATELET    EKG None  Radiology No results found.  Procedures Procedures (including critical care time)  Medications Ordered in ED Medications  dicyclomine (BENTYL) tablet 20 mg (20 mg Oral Given 01/17/18 0908)  hydrOXYzine (ATARAX/VISTARIL) tablet 25 mg (has no administration in time range)  loperamide (IMODIUM) capsule 2-4 mg (has no administration in time range)  methocarbamol (ROBAXIN) tablet 500 mg (500 mg Oral Given 01/17/18 0908)  naproxen (NAPROSYN) tablet 500 mg (500 mg Oral Given 01/17/18 0908)  ondansetron (ZOFRAN-ODT) disintegrating tablet 4 mg (4 mg Oral Given  01/17/18 0909)  LORazepam (ATIVAN) injection 0-4 mg ( Intravenous See Alternative 01/17/18 0908)    Or  LORazepam (ATIVAN) tablet 0-4 mg (1 mg Oral Given 01/17/18 0908)  LORazepam (ATIVAN) injection 0-4 mg (has no administration in time range)    Or  LORazepam (ATIVAN) tablet 0-4 mg (has no administration in time range)  sodium chloride 0.9 % 1,000 mL with thiamine 505 mg, folic acid 1 mg, multivitamins adult 10 mL infusion ( Intravenous New Bag/Given 01/17/18 0903)     Initial Impression / Assessment and Plan / ED Course  I have reviewed the triage vital signs and the nursing notes.  Pertinent labs & imaging results that were available during my care of the patient were reviewed by me and considered in my medical decision making (see chart for details).    Sotero Brinkmeyer is a 52 y.o. male who presents to ED requesting detox facility as well as for suicidal thoughts. On exam, she is afebrile, hemodynamically stable with benign exam. No  signs of acute withdrawal. Labs reassuring. Medically cleared with disposition per psychiatry.   Final Clinical Impressions(s) / ED Diagnoses   Final diagnoses:  Substance induced mood disorder [facility])    ED Discharge Orders    None       Lillis Nuttle, Ozella Almond, PA-C 01/17/18 [place], MD 01/19/18 [phone]

## 2018-01-17 NOTE — ED Notes (Signed)
PT GIVEN BUS PASS. DISCHARGED WITHOUT EVENT. PT DECLINED TO SIGN. PT GIVEN DISCHARGE PAPERS BY TTS DAVE

## 2018-01-17 NOTE — ED Notes (Signed)
Pt escorted out by security due to aggressive behavior towards staff

## 2018-08-03 ENCOUNTER — Encounter (HOSPITAL_COMMUNITY): Payer: Self-pay | Admitting: Medical

## 2018-08-03 NOTE — Progress Notes (Signed)
Pt with Opiate dependence MAT Suboxone at [facility] Dr Lynnda Shields C/O of paranoia he is aware of but cant stop/completely control.Has rx for Risperdal from [facility]. 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

## 2019-05-31 ENCOUNTER — Ambulatory Visit: Payer: Self-pay | Admitting: Surgery

## 2019-06-13 ENCOUNTER — Encounter (HOSPITAL_COMMUNITY): Payer: Self-pay | Admitting: Physician Assistant

## 2019-06-13 ENCOUNTER — Other Ambulatory Visit: Payer: Self-pay

## 2019-06-13 ENCOUNTER — Encounter (HOSPITAL_BASED_OUTPATIENT_CLINIC_OR_DEPARTMENT_OTHER): Payer: Self-pay | Admitting: Surgery

## 2019-06-13 NOTE — Progress Notes (Addendum)
Spoke w/ via phone for pre-op interview---Paul Bray needs dos----  cmet            Bray results------ COVID test ------06-15-2019 Arrive at -------1030 am 06-19-2019 NPO after ------midnight food, water til 930 am then npo Medications to take morning of surgery -----levothyroxine, subutex Diabetic medication -----n/a Patient Special Instructions ----- Pre-Op special Istructions ----- Patient verbalized understanding of instructions that were given at this phone interview. Patient denies shortness of breath, chest pain, fever, cough a this phone interview.  Anesthesia : on subutex, hx of alcohol abuse, brain tumor (mri 11-18-2018 epic) Sees dr schetow pain management Addendum : spoke with Janett Billow zanettto pa ok for patient to take subutex morning of surgery and patient to contact prescriber for subutex if he has any concerns about continuing subutex for surgery.no labs needed day of surgery per jessica zanetto pa.  PCP:dr james little Cardiologist :none Chest x-ray :none EKG :none Echo :none Cardiac Cath : none Sleep Study/ CPAP :10 yrs ago, no sleep spnea Fasting Blood Sugar :      / Checks Blood Sugar -- times a day:  n/a Blood Thinner/ Instructions /Last Dose:n/a ASA / Instructions/ Last Dose : n/a  Patient denies shortness of breath, chest pain, fever, and cough at this phone interview.

## 2019-06-15 ENCOUNTER — Inpatient Hospital Stay (HOSPITAL_COMMUNITY): Admission: RE | Admit: 2019-06-15 | Payer: Medicare HMO | Source: Ambulatory Visit

## 2019-06-16 ENCOUNTER — Inpatient Hospital Stay (HOSPITAL_COMMUNITY): Admission: RE | Admit: 2019-06-16 | Payer: Medicare Other | Source: Ambulatory Visit

## 2019-06-16 NOTE — Progress Notes (Signed)
Patient stated his procedure is going to be at the surgical center of [place] instead of [place]

## 2019-06-19 ENCOUNTER — Ambulatory Visit (HOSPITAL_BASED_OUTPATIENT_CLINIC_OR_DEPARTMENT_OTHER): Admission: RE | Admit: 2019-06-19 | Payer: Medicare Other | Source: Home / Self Care | Admitting: Surgery

## 2019-06-19 HISTORY — DX: Other specified soft tissue disorders: M79.89

## 2019-06-19 HISTORY — DX: Headache, unspecified: R51.9

## 2019-06-19 HISTORY — DX: Attention-deficit hyperactivity disorder, unspecified type: F90.9

## 2019-06-19 HISTORY — DX: Hypothyroidism, unspecified: E03.9

## 2019-06-19 SURGERY — EXCISION MASS
Anesthesia: General | Laterality: Left

## 2019-06-21 ENCOUNTER — Encounter (HOSPITAL_BASED_OUTPATIENT_CLINIC_OR_DEPARTMENT_OTHER): Payer: Self-pay | Admitting: Surgery

## 2019-06-21 DIAGNOSIS — M7989 Other specified soft tissue disorders: Secondary | ICD-10-CM | POA: Diagnosis present

## 2019-06-21 NOTE — H&P (Signed)
General Surgery [facility]  Paul Bray DOB: Mar 13, 1966 Divorced / Language: Paul Bray / Race: White Male   History of Present Illness  The patient is a 54 year old male who presents with a soft tissue mass.  CHIEF COMPLAINT: soft tissue mass left anterior shoulder  Patient is referred from the office of Dr. Tamsen Roers for surgical evaluation and management of a soft tissue mass on the left anterior shoulder. Patient states as this is been present for some time but is gradually increasing in size. It causes discomfort. It limits his range of motion. He is concerned about it being infected. Patient relates no history of trauma or injury to the area. He has never had any such lesions removed from other portions of his body. Patient presents today to discuss surgical excision.   Problem List/Past Medical MASS OF SOFT TISSUE OF SHOULDER (M79.89)   Allergies Mold Spores  Dust  Pollens  Allergies Reconciled   Medication History risperiDONE (1MG  Tablet, Oral) Active. Gabapentin (Oral) Specific strength unknown - Active. Levothyroxine Sodium (112MCG Tablet, Oral) Active. Adderall (Oral) Specific strength unknown - Active. Subutex (Sublingual) Specific strength unknown - Active. Vistaril (Oral) Specific strength unknown - Active. Medications Reconciled  Vitals Weight: 191.38 lb Height: 71in Body Surface Area: 2.07 m Body Mass Index: 26.69 kg/m  Temp.: 59F  Pulse: 145 (Regular)   Physical Exam   GENERAL APPEARANCE Development: normal Nutritional status: normal Gross deformities: none  SKIN Rash, lesions, ulcers: none Induration, erythema: none Nodules: none palpable  EYES Conjunctiva and lids: normal Pupils: equal and reactive Iris: normal bilaterally  EARS, NOSE, MOUTH, THROAT External ears: no lesion or deformity External nose: no lesion or deformity Hearing: grossly normal Patient is wearing a  mask.  NECK Symmetric: yes Trachea: midline Thyroid: no palpable nodules in the thyroid bed  CHEST Respiratory effort: normal Retraction or accessory muscle use: no Breath sounds: normal bilaterally Rales, rhonchi, wheeze: none  CARDIOVASCULAR Auscultation: regular rhythm, normal rate Murmurs: none Pulses: carotid and radial pulse 2+ palpable Lower extremity edema: none Lower extremity varicosities: none  MUSCULOSKELETAL Station and gait: normal Digits and nails: no clubbing or cyanosis Muscle strength: grossly normal all extremities Range of motion: Reduced range of motion left shoulder. Able to elevate the arm to 90 but not beyond. Deformity: Soft tissue mass anterior left shoulder overlying the deltoid measuring 6 x 5 x 3 cm in size, mobile, nontender  LYMPHATIC Cervical: none palpable Supraclavicular: none palpable  PSYCHIATRIC Oriented to person, place, and time: yes Mood and affect: normal for situation Judgment and insight: appropriate for situation    Assessment & Plan  MASS OF SOFT TISSUE OF SHOULDER (M79.89)  Patient is referred by his primary care provider for evaluation of soft tissue mass on the left anterior shoulder. This has been gradually enlarging and increasingly symptomatic to the patient. He states that it causes discomfort and that limits the range of motion with the left shoulder. He would like to have it surgically excised.  We discussed surgical removal of this soft tissue mass. 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 We discussed doing this as an outpatient surgical procedure. We discussed restrictions on his activities after the surgery. We discussed potential complications including infection and seroma formation. Patient understands and wishes to proceed with surgery in the near future.  The risks and benefits of the procedure have been discussed at length with the patient.  The  patient understands the proposed procedure, potential alternative treatments, and the course of recovery to be expected. All of the patient's questions have been answered at this time. The patient wishes to proceed with surgery.   Paul Gemma, MD [facility] Office: [phone]

## 2019-06-26 NOTE — Progress Notes (Signed)
Spoke w/ via phone for pre-op interview---Paul Bray needs dos----  cmet            COVID test ------06-27-2019 Arrive at -------530 am 06-30-2019 NPO after ------midnight Medications to take morning of surgery -----levothyroxine, subutex Diabetic medication -----n/a Patient Special Instructions ----- Pre-Op special Istructions ----- Patient verbalized understanding of instructions that were given at this phone interview. Patient denies shortness of breath, chest pain, fever, cough a this phone interview.  See 06-13-2019 progress note by Paul Docter rn

## 2019-06-27 ENCOUNTER — Other Ambulatory Visit (HOSPITAL_COMMUNITY)
Admission: RE | Admit: 2019-06-27 | Discharge: 2019-06-27 | Disposition: A | Discharge status: Home / Self Care | Payer: Medicare Other | Source: Ambulatory Visit | Attending: Surgery | Admitting: Surgery

## 2019-06-27 DIAGNOSIS — Z01812 Encounter for preprocedural laboratory examination: Secondary | ICD-10-CM | POA: Diagnosis present

## 2019-06-27 DIAGNOSIS — Z20822 Contact with and (suspected) exposure to covid-19: Secondary | ICD-10-CM | POA: Insufficient documentation

## 2019-06-27 LAB — SARS CORONAVIRUS 2 (TAT 6-24 HRS): SARS Coronavirus 2: NEGATIVE

## 2019-06-29 NOTE — Anesthesia Preprocedure Evaluation (Addendum)
Anesthesia Evaluation  Patient identified by MRN, date of birth, ID band Patient awake    Reviewed: Allergy & Precautions, NPO status , Patient's Chart, lab work & pertinent test results  History of Anesthesia Complications Negative for: history of anesthetic complications  Airway Mallampati: III  TM Distance: >3 FB Neck ROM: Full    Dental  (+) Dental Advisory Given, Teeth Intact   Pulmonary Current Smoker and Patient abstained from smoking.,    Pulmonary exam normal        Cardiovascular negative cardio ROS Normal cardiovascular exam     Neuro/Psych  Headaches, PSYCHIATRIC DISORDERS Bipolar Disorder  Hx brain tumor     GI/Hepatic negative GI ROS, (+)     substance abuse (on subutex)  alcohol use,   Endo/Other  Hypothyroidism   Renal/GU negative Renal ROS     Musculoskeletal negative musculoskeletal ROS (+) narcotic dependent  Abdominal   Peds  (+) ADHD Hematology negative hematology ROS (+)   Anesthesia Other Findings Covid neg 06/27/19   Reproductive/Obstetrics                            Anesthesia Physical Anesthesia Plan  ASA: III  Anesthesia Plan: General   Post-op Pain Management:    Induction: Intravenous  PONV Risk Score and Plan: 2 and Treatment may vary due to age or medical condition, Ondansetron, Dexamethasone and Midazolam  Airway Management Planned: LMA  Additional Equipment: None  Intra-op Plan:   Post-operative Plan: Extubation in OR  Informed Consent: I have reviewed the patients History and Physical, chart, labs and discussed the procedure including the risks, benefits and alternatives for the proposed anesthesia with the patient or authorized representative who has indicated his/her understanding and acceptance.     Dental advisory given  Plan Discussed with: CRNA and Anesthesiologist  Anesthesia Plan Comments: (Discussed potential for  difficulty in managing patient's pain postoperatively due to continued subutex use. Patient expressed understanding. )       Anesthesia Quick Evaluation

## 2019-06-30 ENCOUNTER — Encounter (HOSPITAL_BASED_OUTPATIENT_CLINIC_OR_DEPARTMENT_OTHER)
Admission: RE | Disposition: A | Discharge status: Home / Self Care | Payer: Self-pay | Source: Home / Self Care | Attending: Surgery

## 2019-06-30 ENCOUNTER — Ambulatory Visit (HOSPITAL_BASED_OUTPATIENT_CLINIC_OR_DEPARTMENT_OTHER): Payer: Medicare Other | Admitting: Anesthesiology

## 2019-06-30 ENCOUNTER — Other Ambulatory Visit: Payer: Self-pay

## 2019-06-30 ENCOUNTER — Encounter (HOSPITAL_BASED_OUTPATIENT_CLINIC_OR_DEPARTMENT_OTHER): Payer: Self-pay | Admitting: Surgery

## 2019-06-30 ENCOUNTER — Ambulatory Visit (HOSPITAL_BASED_OUTPATIENT_CLINIC_OR_DEPARTMENT_OTHER)
Admission: RE | Admit: 2019-06-30 | Discharge: 2019-06-30 | Disposition: A | Discharge status: Home / Self Care | Payer: Medicare Other | Attending: Surgery | Admitting: Surgery

## 2019-06-30 DIAGNOSIS — Z79891 Long term (current) use of opiate analgesic: Secondary | ICD-10-CM | POA: Diagnosis not present

## 2019-06-30 DIAGNOSIS — Z9109 Other allergy status, other than to drugs and biological substances: Secondary | ICD-10-CM | POA: Diagnosis not present

## 2019-06-30 DIAGNOSIS — M7989 Other specified soft tissue disorders: Secondary | ICD-10-CM

## 2019-06-30 DIAGNOSIS — F17200 Nicotine dependence, unspecified, uncomplicated: Secondary | ICD-10-CM | POA: Insufficient documentation

## 2019-06-30 DIAGNOSIS — F909 Attention-deficit hyperactivity disorder, unspecified type: Secondary | ICD-10-CM | POA: Diagnosis not present

## 2019-06-30 DIAGNOSIS — E039 Hypothyroidism, unspecified: Secondary | ICD-10-CM | POA: Diagnosis not present

## 2019-06-30 DIAGNOSIS — D1722 Benign lipomatous neoplasm of skin and subcutaneous tissue of left arm: Secondary | ICD-10-CM | POA: Insufficient documentation

## 2019-06-30 DIAGNOSIS — F319 Bipolar disorder, unspecified: Secondary | ICD-10-CM | POA: Diagnosis not present

## 2019-06-30 DIAGNOSIS — Z79899 Other long term (current) drug therapy: Secondary | ICD-10-CM | POA: Insufficient documentation

## 2019-06-30 DIAGNOSIS — Z79890 Hormone replacement therapy (postmenopausal): Secondary | ICD-10-CM | POA: Insufficient documentation

## 2019-06-30 DIAGNOSIS — Z8603 Personal history of neoplasm of uncertain behavior: Secondary | ICD-10-CM | POA: Insufficient documentation

## 2019-06-30 HISTORY — PX: MASS EXCISION: SHX2000

## 2019-06-30 LAB — COMPREHENSIVE METABOLIC PANEL
ALT: 19 U/L (ref 0–44)
AST: 18 U/L (ref 15–41)
Albumin: 3.9 g/dL (ref 3.5–5.0)
Alkaline Phosphatase: 79 U/L (ref 38–126)
Anion gap: 7 (ref 5–15)
BUN: 27 mg/dL — ABNORMAL HIGH (ref 6–20)
CO2: 25 mmol/L (ref 22–32)
Calcium: 8.8 mg/dL — ABNORMAL LOW (ref 8.9–10.3)
Chloride: 106 mmol/L (ref 98–111)
Creatinine, Ser: 1.51 mg/dL — ABNORMAL HIGH (ref 0.61–1.24)
GFR calc Af Amer: >60 mL/min (ref >60)
GFR calc non Af Amer: 53 mL/min — ABNORMAL LOW (ref >60)
Glucose, Bld: 108 mg/dL — ABNORMAL HIGH (ref 70–99)
Potassium: 4.1 mmol/L (ref 3.5–5.1)
Sodium: 138 mmol/L (ref 135–145)
Total Bilirubin: 0.6 mg/dL (ref 0.3–1.2)
Total Protein: 6.5 g/dL (ref 6.5–8.1)

## 2019-06-30 SURGERY — EXCISION MASS
Anesthesia: General | Laterality: Left

## 2019-06-30 MED ORDER — OXYCODONE HCL 5 MG/5ML PO SOLN
5.0000 mg | Freq: Once | ORAL | Status: DC | PRN
  Filled 2019-06-30: qty 5

## 2019-06-30 MED ORDER — CEFAZOLIN SODIUM-DEXTROSE 2-4 GM/100ML-% IV SOLN
2.0000 g | INTRAVENOUS | Status: AC
  Administered 2019-06-30: 2 g via INTRAVENOUS
  Filled 2019-06-30: qty 100

## 2019-06-30 MED ORDER — PROPOFOL 10 MG/ML IV BOLUS
INTRAVENOUS | Status: DC | PRN
  Administered 2019-06-30: 200 mg via INTRAVENOUS
  Administered 2019-06-30: 100 mg via INTRAVENOUS

## 2019-06-30 MED ORDER — MIDAZOLAM HCL 2 MG/2ML IJ SOLN
INTRAMUSCULAR | Status: AC
  Filled 2019-06-30: qty 2

## 2019-06-30 MED ORDER — PROMETHAZINE HCL 25 MG/ML IJ SOLN
6.2500 mg | INTRAMUSCULAR | Status: DC | PRN
  Filled 2019-06-30: qty 1

## 2019-06-30 MED ORDER — FENTANYL CITRATE (PF) 100 MCG/2ML IJ SOLN
INTRAMUSCULAR | Status: AC
  Filled 2019-06-30: qty 2

## 2019-06-30 MED ORDER — CHLORHEXIDINE GLUCONATE CLOTH 2 % EX PADS
6.0000 | MEDICATED_PAD | Freq: Once | CUTANEOUS | Status: DC
  Filled 2019-06-30: qty 6

## 2019-06-30 MED ORDER — CELECOXIB 200 MG PO CAPS
ORAL_CAPSULE | ORAL | Status: AC
  Filled 2019-06-30: qty 1

## 2019-06-30 MED ORDER — BUPIVACAINE HCL 0.5 % IJ SOLN
INTRAMUSCULAR | Status: DC | PRN
  Administered 2019-06-30: 20 mL

## 2019-06-30 MED ORDER — PROPOFOL 500 MG/50ML IV EMUL
INTRAVENOUS | Status: AC
  Filled 2019-06-30: qty 50

## 2019-06-30 MED ORDER — KETOROLAC TROMETHAMINE 30 MG/ML IJ SOLN
INTRAMUSCULAR | Status: DC | PRN
  Administered 2019-06-30: 30 mg via INTRAVENOUS

## 2019-06-30 MED ORDER — ONDANSETRON HCL 4 MG/2ML IJ SOLN
INTRAMUSCULAR | Status: AC
  Filled 2019-06-30: qty 2

## 2019-06-30 MED ORDER — ACETAMINOPHEN 160 MG/5ML PO SOLN
960.0000 mg | Freq: Once | ORAL | Status: AC
  Filled 2019-06-30: qty 40.6

## 2019-06-30 MED ORDER — FENTANYL CITRATE (PF) 100 MCG/2ML IJ SOLN
INTRAMUSCULAR | Status: DC | PRN
  Administered 2019-06-30: 100 ug via INTRAVENOUS

## 2019-06-30 MED ORDER — ACETAMINOPHEN 500 MG PO TABS
1000.0000 mg | ORAL_TABLET | Freq: Once | ORAL | Status: AC
  Administered 2019-06-30: 1000 mg via ORAL
  Filled 2019-06-30: qty 2

## 2019-06-30 MED ORDER — CEFAZOLIN SODIUM-DEXTROSE 2-4 GM/100ML-% IV SOLN
INTRAVENOUS | Status: AC
  Filled 2019-06-30: qty 100

## 2019-06-30 MED ORDER — ENSURE PRE-SURGERY PO LIQD
296.0000 mL | Freq: Once | ORAL | Status: DC
  Filled 2019-06-30: qty 296

## 2019-06-30 MED ORDER — DEXAMETHASONE SODIUM PHOSPHATE 4 MG/ML IJ SOLN
INTRAMUSCULAR | Status: DC | PRN
  Administered 2019-06-30: 5 mg via INTRAVENOUS

## 2019-06-30 MED ORDER — LACTATED RINGERS IV SOLN
INTRAVENOUS | Status: DC
  Filled 2019-06-30: qty 1000

## 2019-06-30 MED ORDER — DEXAMETHASONE SODIUM PHOSPHATE 10 MG/ML IJ SOLN
INTRAMUSCULAR | Status: AC
  Filled 2019-06-30: qty 1

## 2019-06-30 MED ORDER — ONDANSETRON HCL 4 MG/2ML IJ SOLN
INTRAMUSCULAR | Status: DC | PRN
  Administered 2019-06-30: 4 mg via INTRAVENOUS

## 2019-06-30 MED ORDER — LIDOCAINE HCL (CARDIAC) PF 100 MG/5ML IV SOSY
PREFILLED_SYRINGE | INTRAVENOUS | Status: DC | PRN
  Administered 2019-06-30: 80 mg via INTRAVENOUS

## 2019-06-30 MED ORDER — MIDAZOLAM HCL 5 MG/5ML IJ SOLN
INTRAMUSCULAR | Status: DC | PRN
  Administered 2019-06-30: 2 mg via INTRAVENOUS

## 2019-06-30 MED ORDER — OXYCODONE HCL 5 MG PO TABS
5.0000 mg | ORAL_TABLET | Freq: Once | ORAL | Status: DC | PRN
  Filled 2019-06-30: qty 1

## 2019-06-30 MED ORDER — ACETAMINOPHEN 500 MG PO TABS
ORAL_TABLET | ORAL | Status: AC
  Filled 2019-06-30: qty 2

## 2019-06-30 MED ORDER — KETOROLAC TROMETHAMINE 30 MG/ML IJ SOLN
INTRAMUSCULAR | Status: AC
  Filled 2019-06-30: qty 1

## 2019-06-30 MED ORDER — CELECOXIB 200 MG PO CAPS
200.0000 mg | ORAL_CAPSULE | Freq: Once | ORAL | Status: AC | PRN
  Administered 2019-06-30: 200 mg via ORAL
  Filled 2019-06-30: qty 1

## 2019-06-30 MED ORDER — FENTANYL CITRATE (PF) 100 MCG/2ML IJ SOLN
25.0000 ug | INTRAMUSCULAR | Status: DC | PRN
  Administered 2019-06-30: 25 ug via INTRAVENOUS
  Filled 2019-06-30: qty 1

## 2019-06-30 SURGICAL SUPPLY — 42 items
BENZOIN TINCTURE PRP APPL 2/3 (GAUZE/BANDAGES/DRESSINGS) ×3 IMPLANT
BLADE SURG 15 STRL LF DISP TIS (BLADE) ×1 IMPLANT
BLADE SURG 15 STRL SS (BLADE) ×2
CHLORAPREP W/TINT 26 (MISCELLANEOUS) ×3 IMPLANT
CLEANER CAUTERY TIP 5X5 PAD (MISCELLANEOUS) IMPLANT
CLOSURE STERI-STRIP 1/2X4 (GAUZE/BANDAGES/DRESSINGS) ×1
CLOSURE WOUND 1/2 X4 (GAUZE/BANDAGES/DRESSINGS) ×1
CLSR STERI-STRIP ANTIMIC 1/2X4 (GAUZE/BANDAGES/DRESSINGS) ×2 IMPLANT
COVER BACK TABLE 60X90IN (DRAPES) ×3 IMPLANT
COVER MAYO STAND STRL (DRAPES) ×3 IMPLANT
COVER WAND RF STERILE (DRAPES) ×6 IMPLANT
DERMABOND ADVANCED (GAUZE/BANDAGES/DRESSINGS) ×2
DERMABOND ADVANCED .7 DNX12 (GAUZE/BANDAGES/DRESSINGS) ×1 IMPLANT
DRAPE LAPAROTOMY 100X72 PEDS (DRAPES) IMPLANT
DRAPE SHEET LG 3/4 BI-LAMINATE (DRAPES) IMPLANT
DRAPE UTILITY XL STRL (DRAPES) ×3 IMPLANT
DRSG TEGADERM 2-3/8X2-3/4 SM (GAUZE/BANDAGES/DRESSINGS) IMPLANT
DRSG TEGADERM 4X4.75 (GAUZE/BANDAGES/DRESSINGS) ×3 IMPLANT
ELECT REM PT RETURN 9FT ADLT (ELECTROSURGICAL) ×3
ELECTRODE REM PT RTRN 9FT ADLT (ELECTROSURGICAL) ×1 IMPLANT
GAUZE SPONGE 4X4 12PLY STRL (GAUZE/BANDAGES/DRESSINGS) ×3 IMPLANT
GLOVE SURG ORTHO 8.0 STRL STRW (GLOVE) ×3 IMPLANT
GOWN STRL REUS W/ TWL XL LVL3 (GOWN DISPOSABLE) ×1 IMPLANT
GOWN STRL REUS W/TWL XL LVL3 (GOWN DISPOSABLE) ×2
KIT TURNOVER CYSTO (KITS) ×3 IMPLANT
NEEDLE HYPO 25X1 1.5 SAFETY (NEEDLE) ×3 IMPLANT
NS IRRIG 500ML POUR BTL (IV SOLUTION) ×3 IMPLANT
PACK BASIN DAY SURGERY FS (CUSTOM PROCEDURE TRAY) ×3 IMPLANT
PAD CLEANER CAUTERY TIP 5X5 (MISCELLANEOUS)
PENCIL BUTTON HOLSTER BLD 10FT (ELECTRODE) ×3 IMPLANT
STRIP CLOSURE SKIN 1/2X4 (GAUZE/BANDAGES/DRESSINGS) ×2 IMPLANT
SUT ETHILON 3 0 PS 1 (SUTURE) IMPLANT
SUT ETHILON 4 0 PS 2 18 (SUTURE) IMPLANT
SUT MNCRL AB 4-0 PS2 18 (SUTURE) ×3 IMPLANT
SUT VIC AB 3-0 SH 18 (SUTURE) IMPLANT
SUT VICRYL 3-0 CR8 SH (SUTURE) ×3 IMPLANT
SUT VICRYL 4-0 PS2 18IN ABS (SUTURE) IMPLANT
SYR CONTROL 10ML LL (SYRINGE) ×3 IMPLANT
TOWEL OR 17X26 10 PK STRL BLUE (TOWEL DISPOSABLE) ×3 IMPLANT
TUBE CONNECTING 12'X1/4 (SUCTIONS)
TUBE CONNECTING 12X1/4 (SUCTIONS) IMPLANT
WATER STERILE IRR 500ML POUR (IV SOLUTION) IMPLANT

## 2019-06-30 NOTE — Interval H&P Note (Signed)
History and Physical Interval Note:  06/30/2019 7:25 AM  Paul Bray  has presented today for surgery, with the diagnosis of SOFT TISSUE MASS LEFT SHOULDER.  The various methods of treatment have been discussed with the patient and family. After consideration of risks, benefits and other options for treatment, the patient has consented to    Procedure(s) with comments: EXCISION OF SOFT TISSUE  MASS LEFT SHOULDER (Left) - LMA as a surgical intervention.    The patient's history has been reviewed, patient examined, no change in status, stable for surgery.  I have reviewed the patient's chart and labs.  Questions were answered to the patient's satisfaction.    Armandina Gemma, MD [facility] Office: [place]

## 2019-06-30 NOTE — Transfer of Care (Signed)
Immediate Anesthesia Transfer of Care Note  Patient: Paul Bray  Procedure(s) Performed: Procedure(s) (LRB): EXCISION OF SOFT TISSUE  MASS LEFT SHOULDER (Left)  Patient Location: PACU  Anesthesia Type: General  Level of Consciousness: awake, sedated, patient cooperative and responds to stimulation  Airway & Oxygen Therapy: Patient Spontanous Breathing and Patient connected to [place] 02 and soft FM   Post-op Assessment: Report given to PACU RN, Post -op Vital signs reviewed and stable and Patient moving all extremities  Post vital signs: Reviewed and stable  Complications: No apparent anesthesia complications

## 2019-06-30 NOTE — Discharge Instructions (Signed)
Post Anesthesia Home Care Instructions  Activity: Get plenty of rest for the remainder of the day. A responsible individual must stay with you for 24 hours following the procedure.  For the next 24 hours, DO NOT: -Drive a car -Paediatric nurse -Drink alcoholic beverages -Take any medication unless instructed by your physician -Make any legal decisions or sign important papers.  Meals: Start with liquid foods such as gelatin or soup. Progress to regular foods as tolerated. Avoid greasy, spicy, heavy foods. If nausea and/or vomiting occur, drink only clear liquids until the nausea and/or vomiting subsides. Call your physician if vomiting continues.  Special Instructions/Symptoms: Your throat may feel dry or sore from the anesthesia or the breathing tube placed in your throat during surgery. If this causes discomfort, gargle with warm salt water. The discomfort should disappear within 24 hours.  If you had a scopolamine patch placed behind your ear for the management of post- operative nausea and/or vomiting:  1. The medication in the patch is effective for 72 hours, after which it should be removed.  Wrap patch in a tissue and discard in the trash. Wash hands thoroughly with soap and water. 2. You may remove the patch earlier than 72 hours if you experience unpleasant side effects which may include dry mouth, dizziness or visual disturbances. 3. Avoid touching the patch. Wash your hands with soap and water after contact with the patch.     Incision and Drainage, Care After This sheet gives you information about how to care for yourself after your procedure. Your health care provider may also give you more specific instructions. If you have problems or questions, contact your health care provider. What can I expect after the procedure? After the procedure, it is common to have:  Pain or discomfort around the incision site.  Blood, fluid, or pus (drainage) from the incision.  Redness  and firm skin around the incision site. Follow these instructions at home: Medicines  Take over-the-counter and prescription medicines only as told by your health care provider.  If you were prescribed an antibiotic medicine, use or take it as told by your health care provider. Do not stop using the antibiotic even if you start to feel better. Wound care Follow instructions from your health care provider about how to take care of your wound. Make sure you:  Wash your hands with soap and water before and after you change your bandage (dressing). If soap and water are not available, use hand sanitizer.  Change your dressing and packing as told by your health care provider. ? If your dressing is dry or stuck when you try to remove it, moisten or wet the dressing with saline or water so that it can be removed without harming your skin or tissues. ? If your wound is packed, leave it in place until your health care provider tells you to remove it. To remove the packing, moisten or wet the packing with saline or water so that it can be removed without harming your skin or tissues.  Leave stitches (sutures), skin glue, or adhesive strips in place. These skin closures may need to stay in place for 2 weeks or longer. If adhesive strip edges start to loosen and curl up, you may trim the loose edges. Do not remove adhesive strips completely unless your health care provider tells you to do that. Check your wound every day for signs of infection. Check for:  More redness, swelling, or pain.  More fluid or blood.  Warmth.  Pus or a bad smell. If you were sent home with a drain tube in place, follow instructions from your health care provider about:  How to empty it.  How to care for it at home.  General instructions  Rest the affected area.  Do not take baths, swim, or use a hot tub until your health care provider approves. Ask your health care provider if you may take showers. You may only be  allowed to take sponge baths.  Return to your normal activities as told by your health care provider. Ask your health care provider what activities are safe for you. Your health care provider may put you on activity or lifting restrictions.  The incision will continue to drain. It is normal to have some clear or slightly bloody drainage. The amount of drainage should lessen each day.  Do not apply any creams, ointments, or liquids unless you have been told to by your health care provider.  Keep all follow-up visits as told by your health care provider. This is important. Contact a health care provider if:  Your cyst or abscess returns.  You have a fever or chills.  You have more redness, swelling, or pain around your incision.  You have more fluid or blood coming from your incision.  Your incision feels warm to the touch.  You have pus or a bad smell coming from your incision.  You have red streaks above or below the incision site. Get help right away if:  You have severe pain or bleeding.  You cannot eat or drink without vomiting.  You have decreased urine output.  You become short of breath.  You have chest pain.  You cough up blood.  The affected area becomes numb or starts to tingle. These symptoms may represent a serious problem that is an emergency. Do not wait to see if the symptoms will go away. Get medical help right away. Call your local emergency services (911 in the [place] Do not drive yourself to the hospital. Summary  After this procedure, it is common to have fluid, blood, or pus coming from the surgery site.  Follow all home care instructions. You will be told how to take care of your incision, how to check for infection, and how to take medicines.  If you were prescribed an antibiotic medicine, take it as told by your health care provider. Do not stop taking the antibiotic even if you start to feel better.  Contact a health care provider if you have  increased redness, swelling, or pain around your incision. Get help right away if you have chest pain, you vomit, you cough up blood, or you have shortness of breath.  Keep all follow-up visits as told by your health care provider. This is important. This information is not intended to replace advice given to you by your health care provider. Make sure you discuss any questions you have with your health care provider. Document Revised: 03/14/2018 Document Reviewed: 03/14/2018 Elsevier Patient Education  2020 [employer].

## 2019-06-30 NOTE — Anesthesia Postprocedure Evaluation (Signed)
Anesthesia Post Note  Patient: Paul Bray  Procedure(s) Performed: EXCISION OF SOFT TISSUE  MASS LEFT SHOULDER (Left )     Patient location during evaluation: PACU Anesthesia Type: General Level of consciousness: awake and alert Pain management: pain level controlled Vital Signs Assessment: post-procedure vital signs reviewed and stable Respiratory status: spontaneous breathing, nonlabored ventilation and respiratory function stable Cardiovascular status: blood pressure returned to baseline and stable Postop Assessment: no apparent nausea or vomiting Anesthetic complications: no    Last Vitals:  Vitals:   06/30/19 0915 06/30/19 0930  BP: 109/71 113/81  Pulse: 61 62  Resp: 11 15  Temp:  36.5 C  SpO2: 96% 100%    Last Pain:  Vitals:   06/30/19 0930  TempSrc:   PainSc: [place] Graciella Arment

## 2019-06-30 NOTE — Op Note (Signed)
Operative Note  Pre-operative Diagnosis:  Soft tissue mass left anterior shoulder  Post-operative Diagnosis:  same  Surgeon:  Armandina Gemma, MD  Assistant:  none   Procedure:  Excision soft tissue mass left anterior shoulder (5x5x2cm), subcutaneous  Anesthesia:  General (LMA)  Estimated Blood Loss:  minimal  Drains: none         Specimen: to pathology  Indications:  Patient is referred from the office of Dr. Tamsen Roers for surgical evaluation and management of a soft tissue mass on the left anterior shoulder. Patient states as this is been present for some time but is gradually increasing in size. It causes discomfort. It limits his range of motion. He is concerned about it being infected. Patient relates no history of trauma or injury to the area. He has never had any such lesions removed from other portions of his body. Patient presents today for surgical excision.  Procedure:  The patient was seen in the pre-op holding area. The risks, benefits, complications, treatment options, and expected outcomes were previously discussed with the patient. The patient agreed with the proposed plan and has signed the informed consent form.  The patient was brought to the operating room by the surgical team, identified as Paul Bray and the procedure verified. A "time out" was completed and the above information confirmed.  Following administration of general anesthesia, the patient is positioned and then prepped and draped.  An incision is made over the mass on the left anterior shoulder.  Using sharp dissection, the incision is extended to the mass and hemostasis obtained with the electrocautery.  The mass is mobilized circumferentially down to the underlying muscle fascia.  The mass is completely excised with the electrocautery.  It is measured and submitted to pathology for review.  Local anesthetic is infiltrated throughout the operative field.  The wound is closed in layers with  interrupted 3-0 Vicryl sutures followed by a 4-0 Monocryl suture in the skin.  Wound is washed and dried and Dermabond is applied as dressing.  Patient is awakened from anesthesia and transported to the recovery room in stable condition.  The patient tolerated the procedure well.   Armandina Gemma, MD [facility] Office: [phone]

## 2019-06-30 NOTE — Anesthesia Procedure Notes (Signed)
Procedure Name: LMA Insertion Date/Time: 06/30/2019 7:45 AM Performed by: Justice Rocher, CRNA Pre-anesthesia Checklist: Patient identified, Emergency Drugs available, Suction available and Patient being monitored Patient Re-evaluated:Patient Re-evaluated prior to induction Oxygen Delivery Method: Circle system utilized Preoxygenation: Pre-oxygenation with 100% oxygen Induction Type: IV induction Ventilation: Mask ventilation without difficulty LMA: LMA inserted LMA Size: 5.0 Number of attempts: 1 Airway Equipment and Method: Bite block Placement Confirmation: positive ETCO2,  breath sounds checked- equal and bilateral and CO2 detector Tube secured with: Tape Dental Injury: Teeth and Oropharynx as per pre-operative assessment

## 2019-07-03 LAB — SURGICAL PATHOLOGY

## 2019-07-04 NOTE — Progress Notes (Signed)
Please contact patient and notify of benign pathology results.  Alexzandra Bilton M. Marvina Danner, MD, FACS [facility] Office: [phone]   

## 2019-07-13 ENCOUNTER — Ambulatory Visit (INDEPENDENT_AMBULATORY_CARE_PROVIDER_SITE_OTHER): Payer: Medicare Other | Admitting: Addiction (Substance Use Disorder)

## 2019-07-13 ENCOUNTER — Encounter: Payer: Self-pay | Admitting: Addiction (Substance Use Disorder)

## 2019-07-13 DIAGNOSIS — F1121 Opioid dependence, in remission: Secondary | ICD-10-CM

## 2019-07-13 DIAGNOSIS — F0634 Mood disorder due to known physiological condition with mixed features: Secondary | ICD-10-CM

## 2019-07-13 NOTE — Progress Notes (Signed)
[facility] Counselor Initial Adult Exam  Name: Paul Bray Date: 07/13/2019 MRN: [id number] DOB: Jun 20, 1965 PCP: Tamsen Roers, MD  Time spent: 1:08-2:00 52 mins  Reason for Visit /Presenting Problem: Client came to session with therapist, following her from getting treatment from her over the last 2 years of his life and his recovery. Client in recovery from substances and also suffering from body tumors, esp an inoperable brain tumor. Client processed struggles related to that and how hes coping with stress around those things, but his gratitude for getting them, the trigger that caused him to seek sobriety so he could fight the brain tumor battle for his life. Client reported symptoms: Stressed, sad after his dog just died but also hopeful for his continued recovery and excited for continuing to be able to live his life.  Therapist normalized client's experience and provided empathy for client's struggles with his health and congratulated client for continued sobriety as hes been in remission from opiates and alcohol. Therapist remained attuned to client's regulation in session and worked to demonstrate a regulated nervous system and provide support as client processed pain/suffering/ negative somatic sensations/emotions/ thoughts/ memories. Therapist also worked to help client ground their body if flooded/ disassociating and not feeling safe using focused mindfulness. Therapist inquired further with clients safety, sobriety, and stability. Client denied SI/HI/AVH, cravings and reported sobriety and stability and safety. Therapist used Relapse Prevention therapy & discussed with client their symptoms, concerns, and how client is taking care of keeping his SUD in remission and his recovery up: working a program with a sponsor and getting MAT txt to assist him in keeping cravings at [place]. Finally, therapist affirmed client's bravery in processing and also client's progress in treatment, encouraging  client to continue coming to therapy to continue to process more cravings, urges, and methods for treating other process addictions client has since he got sober from substances.   Mental Status Exam:   Appearance:   Neat     Behavior:  Sharing  Motor:  Normal  Speech/Language:   Clear and Coherent  Affect:  Appropriate  Mood:  euthymic  Thought process:  normal  Thought content:    WNL  Sensory/Perceptual disturbances:    WNL  Orientation:  x4  Attention:  Good  Concentration:  Good  Memory:  impaired by way of brain tumor  Fund of knowledge:   Good  Insight:    Good  Judgment:   Good  Impulse Control:  Fair   Reported Symptoms:  Stress, sad after his dog died, hopeful for his recovery, excited for life.   Risk Assessment: Danger to Self:  No Self-injurious Behavior: No Danger to Others: No Duty to Warn:no Physical Aggression / Violence:No  Access to Firearms a concern: No  Gang Involvement:No  Patient / guardian was educated about steps to take if suicide or homicide risk level increases between visits: yes While future psychiatric events cannot be accurately predicted, the patient does not currently require acute inpatient psychiatric care and does not currently meet [facility] involuntary commitment criteria.  Substance Abuse History: Current substance abuse: No   - treated by Dr Rachael Fee at [facility] taking Subutex ( buprenorphine).  Past Psychiatric History:   Previous psychological history is significant for anxiety, depression and ADHD & bipolar & SUD Outpatient Providers:denied. History of Psych Hospitalization: Yes  Psychological Testing: n/a   Abuse History: Victim of No., n/a   Report needed: No. Victim of Neglect:No. Perpetrator of n/a  Witness /  Exposure to Domestic Violence: No   Protective Services Involvement: No  Witness to [employer] Violence:  No   Family History: No family history on file.  Living situation: the  patient lives alone  Sexual Orientation:  Straight  Relationship Status: single  Name of spouse / other:n/a             If a parent, number of children / ages:n/a  Support Systems; friends parents  Financial Stress:  No   Income/Employment/Disability: Long-Term Audiological scientist: No   Educational History: Education: Scientist, product/process development:   [employer]  Any cultural differences that may affect / interfere with treatment:  not applicable   Recreation/Hobbies: music  Stressors:Health problems Medication change or noncompliance  Strengths:  Family, Friends, Church, Spirituality, Hopefulness and Able to Communicate Effectively  Barriers:  n/a   Medical History/Surgical History:reviewed Past Medical History:  Diagnosis Date  . ADHD   . Brain tumor ([place])    balance and occ memory issues and headaches  . Brain tumor ([place])    sees [facility] q year  . Headache    migraine and tension  . Hypothyroidism   . Soft tissue mass    left shoulder  . Substance abuse [facility])     Past Surgical History:  Procedure Laterality Date  . colonscopy    . gamma knife radiation 2018 for brain tumor'    . hematoma removed from arm Left   . MASS EXCISION Left 06/30/2019   Procedure: EXCISION OF SOFT TISSUE  MASS LEFT SHOULDER;  Surgeon: Armandina Gemma, MD;  Location: [facility];  Service: General;  Laterality: Left;  LMA    Medications: Current Outpatient Medications  Medication Sig Dispense Refill  . acetaminophen (TYLENOL) 500 MG tablet Take 2,000 mg by mouth 2 (two) times daily as needed for headache (pain).     . Amphetamine-Dextroamphetamine (ADDERALL PO) Take 25 mg by mouth in the morning and at bedtime.    . buprenorphine (SUBUTEX) 8 MG SUBL SL tablet Place 8 mg under the tongue 3 (three) times daily.    Marland Kitchen gabapentin (NEURONTIN) 300 MG capsule Take 300 mg by mouth 2 (two) times daily as needed.    . hydrOXYzine  (ATARAX/VISTARIL) 10 MG tablet Take 10 mg by mouth as needed.    Marland Kitchen levothyroxine (SYNTHROID, LEVOTHROID) 112 MCG tablet Take 112 mcg by mouth daily before breakfast.    . topiramate (TOPAMAX) 100 MG tablet Take 100 mg by mouth at bedtime.    Marland Kitchen UNABLE TO FIND Med Name: nicotine lozenges 0.4 mg tid     No current facility-administered medications for this visit.    No Known Allergies  Diagnoses:    ICD-10-CM   1. 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  Client is to continue seing medication provider for support of mood management, triggers and cravings.   Client to follow through with maintaining outside support for them in their SUD txt: engaging in 12 step meetings and maintaining use of MAT for opioid use disorder.  Client to engage in [place] txt and RPT relapse prevention therapy AEB coming to therapy weekly and implementing recovery oriented coping strategies to help reduce use of substances and to find relief from  symptoms of MH disorders and trauma that cause them to want to escape from reality and numb their mind&body.  Client also to create more stability & structure AEB goal planning with therapist to assist in helping them get into a healthy rhythm/ schedule that can help to calm their nervous system and begin to build more healthy brain neuropathways.  Client to engage in CBT: challenging negative internal ruminations and self-talk AEB expressing toxic thoughts and challenging them with truth.  Client to practice DBT distress tolerance skills (such as distress tolerance and emotion regulation) to practice achieving wise mind and to build support for dealing with cravings AEB learning to ride the wave of emotion/sensation/etc instead of seeking negative  self-soothing techniques: ie using substances to numb self.  Client to utilize BSP (brainspotting) with therapist to help client identify and process triggers for their MH/ SUD with goal of reducing SUDs by 33% each session.  Client to prioritize sleep 8+ hours each week night AEB going to bed by 10pm each night.  Client participated in the treatment planning of their therapy. Client agreed with the plan and understands what to do if there is a crisis: call 9-1-1 and/or crisis line given by therapist.   Barnie Del, LCSW, LCAS, CCTP, CCS-I, BSP

## 2019-08-24 ENCOUNTER — Ambulatory Visit (INDEPENDENT_AMBULATORY_CARE_PROVIDER_SITE_OTHER): Payer: Medicare Other | Admitting: Addiction (Substance Use Disorder)

## 2019-08-24 ENCOUNTER — Encounter: Payer: Self-pay | Admitting: Addiction (Substance Use Disorder)

## 2019-08-24 DIAGNOSIS — F0634 Mood disorder due to known physiological condition with mixed features: Secondary | ICD-10-CM | POA: Diagnosis not present

## 2019-08-24 DIAGNOSIS — F1121 Opioid dependence, in remission: Secondary | ICD-10-CM | POA: Diagnosis not present

## 2019-08-24 NOTE — Progress Notes (Signed)
[facility] Counselor/Therapist Progress Note  Patient ID: Paul Bray, MRN: [id number],    Date: 08/24/2019  Time Spent: 2:00-2:35 35 mins  Treatment Type: Individual Therapy  Reported Symptoms: stressed about money, more stable with MH.   Mental Status Exam:  Appearance:   Well Groomed     Behavior:  Appropriate  Motor:  Normal  Speech/Language:   Clear and Coherent  Affect:  Appropriate  Mood:  normal  Thought process:  circumstantial and concrete  Thought content:    WNL  Sensory/Perceptual disturbances:    WNL  Orientation:  x4  Attention:  Good  Concentration:  Good  Memory:  impaired by brain tumor  Fund of knowledge:   Good  Insight:    Good  Judgment:   Good  Impulse Control:  Good   Risk Assessment: Danger to Self:  No Self-injurious Behavior: No Danger to Others: No Duty to Warn:no Physical Aggression / Violence:No  Access to Firearms a concern: No  Gang Involvement:No  Substance use: No  Virtual Visit via Telephone Note I Connected with client by a video enabled telemedicine/telehealth application or telephone, with their informed consent, and verified client privacy and that I am speaking with the correct person using two identifiers. I discussed the limitations, risks, security and privacy concerns of performing psychotherapy and management service by telephone/teletherapy and the availability of in person appointments and confirmed their location. I also discussed with the patient that there may be a patient responsible charge related to this service and to confirm with the front desk if their insurance accepts teletherapy. The patient expressed understanding and agreed to proceed. I discussed the treatment planning with the client. The client was provided an opportunity to ask questions and all were answered. The client agreed with the plan and demonstrated an understanding of the instructions. The client was advised to call our office if symptoms  worsen or feel they are in a crisis state and need immediate contact. Client also reminded of a crisis line number and to use 9-1-1 if there's an emergency.  Therapist Location: office; Client Location: home. Subjective: Client processed recent stressors related to money, but his slow and steady method for making and saving money as he saves back up to before he used it in active addiction. Therapist used RPT with client to help him identify more of his healthy coping skills/strategies to help him stay sober and stable. Client processed his improvement in his recovery and with his [place] as he has gotten sober and stayed sober. Therapist assessed for safety and sobriety and client denied SI/HI/AVH and reported sobriety and stability. Client processed old grief related to old losses in his life due to the addiction, such as loss of his girlfriend and love of his life to an overdose. Therapist  Used MI and Grief therapy to validate client's pain, provide a space for him to process, and someone to share the weight of the grief with. Client also reported how he is remembering her as a way to keep fighting his addiction. Client denied cravings but reported triggers and shared his involvement in 12 step meetings.   Interventions: Cognitive Behavioral Therapy, Motivational Interviewing, 12-Step, Grief Therapy and RPT  Diagnosis:   ICD-10-CM   1. Opioid dependence in remission (Paul Bray)  F11.21   2. 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 Client is to continue seing medication provider for support of mood management, triggers and cravings.   Client to follow through with maintaining outside support for them in their SUD txt: engaging in 12 step meetings and maintaining use of MAT for opioid use disorder.  Client to engage in [place] txt  and RPT relapse prevention therapy AEB coming to therapy weekly and implementing recovery oriented coping strategies to help reduce use of substances and to find relief from symptoms of MH disorders and trauma that cause them to want to escape from reality and numb their mind&body.  Client also to create more stability & structure AEB goal planning with therapist to assist in helping them get into a healthy rhythm/ schedule that can help to calm their nervous system and begin to build more healthy brain neuropathways.  Client to engage in CBT: challenging negative internal ruminationsand self-talk AEB expressing toxic thoughts and challenging them with truth.  Client to practice DBT distress tolerance skills (such as distress tolerance and emotion regulation) to practice achieving wise mind and to build support for dealing with cravings AEB learning to ride the wave of emotion/sensation/etc instead of seeking negative self-soothing techniques: ie using substances to numb self.  Client to utilize BSP (brainspotting) with therapist to help client identify and process triggers for their MH/ SUD with goal of reducing SUDs by 33% each session. Client to prioritize sleep 8+ hours each week night AEB going to bed by 10pm each night. Client participated in the treatment planning of their therapy. Client agreed with the plan and understands what to do if there is a crisis: call 9-1-1 and/or crisis line given by therapist.   Barnie Del, LCSW, LCAS, CCTP, CCS-I, BSP

## 2019-08-29 ENCOUNTER — Telehealth: Payer: Self-pay | Admitting: Addiction (Substance Use Disorder)

## 2019-08-29 ENCOUNTER — Encounter: Payer: Self-pay | Admitting: Addiction (Substance Use Disorder)

## 2019-08-29 ENCOUNTER — Ambulatory Visit (INDEPENDENT_AMBULATORY_CARE_PROVIDER_SITE_OTHER): Payer: Medicare Other | Admitting: Addiction (Substance Use Disorder)

## 2019-08-29 DIAGNOSIS — F0634 Mood disorder due to known physiological condition with mixed features: Secondary | ICD-10-CM

## 2019-08-29 DIAGNOSIS — F1121 Opioid dependence, in remission: Secondary | ICD-10-CM | POA: Diagnosis not present

## 2019-08-29 NOTE — Telephone Encounter (Signed)
Mr. moritz, gililland are scheduled for a virtual visit with your provider today.    Just as we do with appointments in the office, we must obtain your consent to participate.  Your consent will be active for this visit and any virtual visit you may have with one of our providers in the next 365 days.    If you have a MyChart account, I can also send a copy of this consent to you electronically.  All virtual visits are billed to your insurance company just like a traditional visit in the office.  As this is a virtual visit, video technology does not allow for your provider to perform a traditional examination.  This may limit your provider's ability to fully assess your condition.  If your provider identifies any concerns that need to be evaluated in person or the need to arrange testing such as labs, EKG, etc, we will make arrangements to do so.    Although advances in technology are sophisticated, we cannot ensure that it will always work on either your end or our end.  If the connection with a video visit is poor, we may have to switch to a telephone visit.  With either a video or telephone visit, we are not always able to ensure that we have a secure connection.   I need to obtain your verbal consent now.   Are you willing to proceed with your visit today?   MICHAELTHOMAS STANSBERY has provided verbal consent on 08/29/2019 for a virtual visit (video or telephone).   Barnie Del, LCSW, LCAS, CCTP, CCS-I, BSP 08/29/2019  9:36 AM

## 2019-08-29 NOTE — Telephone Encounter (Deleted)
Mr. ameer, caballeros are scheduled for a virtual visit with your provider today.    Just as we do with appointments in the office, we must obtain your consent to participate.  Your consent will be active for this visit and any virtual visit you may have with one of our providers in the next 365 days.    If you have a MyChart account, I can also send a copy of this consent to you electronically.  All virtual visits are billed to your insurance company just like a traditional visit in the office.  As this is a virtual visit, video technology does not allow for your provider to perform a traditional examination.  This may limit your provider's ability to fully assess your condition.  If your provider identifies any concerns that need to be evaluated in person or the need to arrange testing such as labs, EKG, etc, we will make arrangements to do so.    Although advances in technology are sophisticated, we cannot ensure that it will always work on either your end or our end.  If the connection with a video visit is poor, we may have to switch to a telephone visit.  With either a video or telephone visit, we are not always able to ensure that we have a secure connection.   I need to obtain your verbal consent now.   Are you willing to proceed with your visit today?   BIJAN DAUZAT has provided verbal consent on 08/29/2019 for a virtual visit (video or telephone).   Barnie Del, LCSW, LCAS, CCTP, CCS-I, BSP 08/29/2019  8:21 AM

## 2019-08-29 NOTE — Progress Notes (Signed)
[facility] Counselor/Therapist Progress Note  Patient ID: Paul Bray, MRN: [id number],    Date: 08/29/2019  Time Spent: 9:05-10:00 55 mins  Treatment Type: Individual Therapy  Reported Symptoms: triggered by the draw of partying with girls/drugs.   Mental Status Exam:  Appearance:   Well Groomed     Behavior:  Appropriate  Motor:  Normal  Speech/Language:   Clear and Coherent  Affect:  Appropriate  Mood:  normal  Thought process:  circumstantial and concrete  Thought content:    WNL  Sensory/Perceptual disturbances:    WNL  Orientation:  x4  Attention:  Good  Concentration:  Good  Memory:  impaired by brain tumor  Fund of knowledge:   Good  Insight:    Good  Judgment:   Good  Impulse Control:  Good   Risk Assessment: Danger to Self:  No Self-injurious Behavior: No Danger to Others: No Duty to Warn:no Physical Aggression / Violence:No  Access to Firearms a concern: No  Gang Involvement:No  Substance use: No  Virtual Visit via Telephone Note I Connected with client by a video enabled telemedicine/telehealth application or telephone, with their informed consent, and verified client privacy and that I am speaking with the correct person using two identifiers. I discussed the limitations, risks, security and privacy concerns of performing psychotherapy and management service by telephone/teletherapy and the availability of in person appointments and confirmed their location. I also discussed with the patient that there may be a patient responsible charge related to this service and to confirm with the front desk if their insurance accepts teletherapy. The patient expressed understanding and agreed to proceed. I discussed the treatment planning with the client. The client was provided an opportunity to ask questions and all were answered. The client agreed with the plan and demonstrated an understanding of the instructions. The client was advised to call our office if  symptoms worsen or feel they are in a crisis state and need immediate contact. Client also reminded of a crisis line number and to use 9-1-1 if there's an emergency.  Therapist Location: office; Client Location: home. Subjective: Client processed the struggles this past week related to living in his new apartment. Client struggled with being triggered by 2 things: the apartment charged him extra $200 fee ontop of his rent & partying with girls/drugs. Therapist used MI to validate's clients' struggle with the "flesh". Therapist used RPT & the 12 steps to help client identify his triggers and where he is romanticizing the old using and girls lifestyle. Client processed playing the tape out to help him realize the empty "high" he'd get from returning to that using lifestyle and remembering he would de-stabilize and worsen until he relapsed fully. Client processed the anticipatory grief related to thinking about dying as a result of returning to that old lifestyle if he were to go to that party. Client made progress by choosing not to go to the party and by telling the girls: "not to bother him anymore". Therapist helped remind client of DBT & grounding techniques to help him fight his addiction, such as: distress tolerance and emotion regulation techniques.   Interventions: Dialectical Behavioral Therapy, Motivational Interviewing, 12-Step, Ego-Supportive, Grief Therapy and RPT  Diagnosis:   ICD-10-CM   1. Opioid dependence in remission ([place])  F11.21   2. 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 Client is to continue seing medication provider for support of mood management, triggers and cravings.   Client to follow through with maintaining outside support for them in their SUD txt: engaging in 12 step meetings and  maintaining use of MAT for opioid use disorder.  Client to engage in [place] txt and RPT relapse prevention therapy AEB coming to therapy weekly and implementing recovery oriented coping strategies to help reduce use of substances and to find relief from symptoms of MH disorders and trauma that cause them to want to escape from reality and numb their mind&body.  Client also to create more stability & structure AEB goal planning with therapist to assist in helping them get into a healthy rhythm/ schedule that can help to calm their nervous system and begin to build more healthy brain neuropathways.  Client to engage in CBT: challenging negative internal ruminationsand self-talk AEB expressing toxic thoughts and challenging them with truth.  Client to practice DBT distress tolerance skills (such as distress tolerance and emotion regulation) to practice achieving wise mind and to build support for dealing with cravings AEB learning to ride the wave of emotion/sensation/etc instead of seeking negative self-soothing techniques: ie using substances to numb self.  Client to utilize BSP (brainspotting) with therapist to help client identify and process triggers for their MH/ SUD with goal of reducing SUDs by 33% each session. Client to prioritize sleep 8+ hours each week night AEB going to bed by 10pm each night. Client participated in the treatment planning of their therapy. Client agreed with the plan and understands what to do if there is a crisis: call 9-1-1 and/or crisis line given by therapist.   Barnie Del, LCSW, LCAS, CCTP, CCS-I, BSP

## 2019-09-05 ENCOUNTER — Ambulatory Visit: Payer: Medicare Other | Admitting: Addiction (Substance Use Disorder)

## 2019-09-08 ENCOUNTER — Encounter: Payer: Self-pay | Admitting: Addiction (Substance Use Disorder)

## 2019-09-08 ENCOUNTER — Ambulatory Visit (INDEPENDENT_AMBULATORY_CARE_PROVIDER_SITE_OTHER): Payer: Medicare Other | Admitting: Addiction (Substance Use Disorder)

## 2019-09-08 DIAGNOSIS — F1121 Opioid dependence, in remission: Secondary | ICD-10-CM | POA: Diagnosis not present

## 2019-09-08 DIAGNOSIS — F0634 Mood disorder due to known physiological condition with mixed features: Secondary | ICD-10-CM | POA: Diagnosis not present

## 2019-09-08 NOTE — Progress Notes (Signed)
[facility] Counselor/Therapist Progress Note  Patient ID: Paul Bray, MRN: [id number],    Date: 09/08/2019  Time Spent: 9:06-10:00 54 mins  Treatment Type: Individual Therapy  Reported Symptoms: hopeful, less stressed, stable MH.  Mental Status Exam:  Appearance:   Casual     Behavior:  Appropriate  Motor:  Normal  Speech/Language:   Clear and Coherent  Affect:  Appropriate  Mood:  normal  Thought process:  circumstantial and concrete  Thought content:    WNL  Sensory/Perceptual disturbances:    WNL  Orientation:  x4  Attention:  Good  Concentration:  Good  Memory:  impaired by brain tumor  Fund of knowledge:   Good  Insight:    Good  Judgment:   Good  Impulse Control:  Good   Risk Assessment: Danger to Self:  No Self-injurious Behavior: No Danger to Others: No Duty to Warn:no Physical Aggression / Violence:No  Access to Firearms a concern: No  Gang Involvement:No  Substance use: No  Virtual Visit via Telephone Note I Connected with client by a video enabled telemedicine/telehealth application or telephone, with their informed consent, and verified client privacy and that I am speaking with the correct person using two identifiers. I discussed the limitations, risks, security and privacy concerns of performing psychotherapy and management service by telephone/teletherapy and the availability of in person appointments and confirmed their location. I also discussed with the patient that there may be a patient responsible charge related to this service and to confirm with the front desk if their insurance accepts teletherapy. The patient expressed understanding and agreed to proceed. I discussed the treatment planning with the client. The client was provided an opportunity to ask questions and all were answered. The client agreed with the plan and demonstrated an understanding of the instructions. The client was advised to call our office if symptoms worsen or  feel they are in a crisis state and need immediate contact. Client also reminded of a crisis line number and to use 9-1-1 if there's an emergency.  Therapist Location: office; Client Location: home. Subjective: Client shared about his past struggles and times just recently even, where he thought everything was going wrong and he stayed calm and the course, and things worked out. Client reported being more hopeful, less stressed, and having stable MH. Therapist used MI to affirm his progress and RPT to encourage client to continue using his relapse prevention plan and specific goals to motivate him and give him hope in struggles while helping him cope with his stressors. Therapist and client discussed client's current involvement with 12 step programs and used SFT with client to help client continue to process a couple struggles/temptations and ways to avoid giving in. Therapist encouraged client to use his distress tolerance and emotion regulation techniques daily to keep from making wrong decision if/when triggered.  Interventions: Motivational Interviewing, 12-Step, Solution-Oriented/Positive Psychology and RPT  Diagnosis:   ICD-10-CM   1. 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 Client is to continue seing medication provider for support of mood management, triggers and cravings.   Client to follow through with maintaining outside support for them in their SUD txt: engaging in 12 step meetings and maintaining use of MAT for  opioid use disorder.  Client to engage in [place] txt and RPT relapse prevention therapy AEB coming to therapy weekly and implementing recovery oriented coping strategies to help reduce use of substances and to find relief from symptoms of MH  disorders and trauma that cause them to want to escape from reality and numb their mind&body.  Client also to create more stability & structure AEB goal planning with therapist to assist in helping them get into a healthy rhythm/ schedule that can help to calm their nervous system and begin to build more healthy brain neuropathways.  Client to engage in CBT: challenging negative internal ruminationsand self-talk AEB expressing toxic thoughts and challenging them with truth.  Client to practice DBT distress tolerance skills (such as distress tolerance and emotion regulation) to practice achieving wise mind and to build support for dealing with cravings AEB learning to ride the wave of emotion/sensation/etc instead of seeking negative self-soothing techniques: ie using substances to numb self.  Client to utilize BSP (brainspotting) with therapist to help client identify and process triggers for their MH/ SUD with goal of reducing SUDs by 33% each session. Client to prioritize sleep 8+ hours each week night AEB going to bed by 10pm each night. Client participated in the treatment planning of their therapy. Client agreed with the plan and understands what to do if there is a crisis: call 9-1-1 and/or crisis line given by therapist.   Barnie Del, LCSW, LCAS, CCTP, CCS-I, BSP

## 2019-09-19 ENCOUNTER — Encounter: Payer: Self-pay | Admitting: Addiction (Substance Use Disorder)

## 2019-09-19 ENCOUNTER — Ambulatory Visit (INDEPENDENT_AMBULATORY_CARE_PROVIDER_SITE_OTHER): Payer: Medicare Other | Admitting: Addiction (Substance Use Disorder)

## 2019-09-19 DIAGNOSIS — C719 Malignant neoplasm of brain, unspecified: Secondary | ICD-10-CM

## 2019-09-19 DIAGNOSIS — F1121 Opioid dependence, in remission: Secondary | ICD-10-CM

## 2019-09-19 DIAGNOSIS — G4710 Hypersomnia, unspecified: Secondary | ICD-10-CM | POA: Diagnosis not present

## 2019-09-19 DIAGNOSIS — F0634 Mood disorder due to known physiological condition with mixed features: Secondary | ICD-10-CM | POA: Diagnosis not present

## 2019-09-19 DIAGNOSIS — G4730 Sleep apnea, unspecified: Secondary | ICD-10-CM

## 2019-09-19 NOTE — Progress Notes (Signed)
[facility] Counselor/Therapist Progress Note  Patient ID: Paul Bray, MRN: [id number],    Date: 09/19/2019  Time Spent: 1:07-2:03 55 mins  Treatment Type: Individual Therapy  Reported Symptoms: tired of fighting effects of brain tumor.   Mental Status Exam:  Appearance:   Casual     Behavior:  Appropriate  Motor:  Normal  Speech/Language:   Clear and Coherent  Affect:  Appropriate  Mood:  normal  Thought process:  circumstantial and concrete  Thought content:    WNL  Sensory/Perceptual disturbances:    WNL  Orientation:  x4  Attention:  Good  Concentration:  Good  Memory:  impaired by brain tumor  Fund of knowledge:   Good  Insight:    Good  Judgment:   Good  Impulse Control:  Good   Risk Assessment: Danger to Self:  No Self-injurious Behavior: No Danger to Others: No Duty to Warn:no Physical Aggression / Violence:No  Access to Firearms a concern: No  Gang Involvement:No  Substance use: No  Virtual Visit via Telephone Note I Connected with client by a video enabled telemedicine/telehealth application or telephone, with their informed consent, and verified client privacy and that I am speaking with the correct person using two identifiers. I discussed the limitations, risks, security and privacy concerns of performing psychotherapy and management service by telephone/teletherapy and the availability of in person appointments and confirmed their location. I also discussed with the patient that there may be a patient responsible charge related to this service and to confirm with the front desk if their insurance accepts teletherapy. The patient expressed understanding and agreed to proceed. I discussed the treatment planning with the client. The client was provided an opportunity to ask questions and all were answered. The client agreed with the plan and demonstrated an understanding of the instructions. The client was advised to call our office if symptoms worsen  or feel they are in a crisis state and need immediate contact. Client also reminded of a crisis line number and to use 9-1-1 if there's an emergency.  Therapist Location: office; Client Location: home.  Subjective: Client reported feeling overly tired of fighting effects of brain tumor. Client reported the reasons he began taking care of himself medically: after getting the worst news: that he had a brain tumor. Client reported not having any current tumor growth but still experiencing ongoing issues of brain tumor and thyroid issues. Therapist used MI & SFT & RPT with client in session to work with client to move forward and reach goals in his life, esp related to his ability to stay awake during the day and get his goals accomplished. Client shared his inability to stay awake even 6 hours a day most days and therapist used medication management with client to discuss all his medications and encourage client to continue to discuss all meds and his needs with all his doctors, advocating for himself. Client reported having worked really hard to create a medical team of doctors, counselors, peer support specialists, and supports. Client feeling discouraged about not having a full team to help him succeed in staying out of bed, less depressed, and awake/energetic. Therapist assessed for safety and sobriety and client denied SI/HI/AVH and reported being sober and passing his drug screens at the clinic he receives Subutex at. Client reported being motivated to never return to using but also still struggling with old patterns that arent serving him anymore. Therapist used 12 steps & RPT with client to help  him consider changing behavioral addictions. Therapist encouraged use of distress tolerance skills to keep him in check and fighting for his sobriety & success.    Interventions: Motivational Interviewing, 12-Step, Solution-Oriented/Positive Psychology and RPT & medication management  Diagnosis:   ICD-10-CM    1. 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 Hypersomnia with sleep apnea  G47.10    G47.30   3. 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 Client is to continue seing medication provider for support of mood management, triggers and cravings.   Client to follow through with maintaining outside support for them in their SUD txt: engaging in 12 step meetings and maintaining use of MAT for opioid use disorder.  Client to engage in [place] txt and RPT relapse prevention therapy AEB coming to therapy weekly and implementing recovery oriented coping strategies to help reduce use of substances and to find relief from symptoms of MH disorders and trauma that cause them to want to escape from reality and numb their mind&body.  Client also to create more stability & structure AEB goal planning with therapist to assist in helping them get into a healthy rhythm/ schedule that can help to calm their nervous system and begin to build more healthy brain neuropathways.  Client to engage in CBT: challenging negative internal ruminationsand self-talk AEB expressing toxic thoughts and challenging them with truth.  Client to practice DBT distress tolerance skills (such as distress tolerance and emotion regulation) to practice achieving wise mind and to build support for dealing with cravings AEB learning to ride the wave of emotion/sensation/etc instead of seeking negative self-soothing techniques: ie using substances to numb self.  Client to utilize BSP (brainspotting) with therapist to help client identify and process triggers for their MH/ SUD with goal of reducing SUDs by 33% each session. Client to prioritize sleep 8+ hours each week night AEB going to bed by 10pm each night. Client  participated in the treatment planning of their therapy. Client agreed with the plan and understands what to do if there is a crisis: call 9-1-1 and/or crisis line given by therapist.   Barnie Del, LCSW, LCAS, CCTP, CCS-I, BSP

## 2019-09-20 ENCOUNTER — Ambulatory Visit: Payer: Medicare Other | Admitting: Physician Assistant

## 2019-09-26 ENCOUNTER — Ambulatory Visit (INDEPENDENT_AMBULATORY_CARE_PROVIDER_SITE_OTHER): Payer: Medicare Other | Admitting: Addiction (Substance Use Disorder)

## 2019-09-26 ENCOUNTER — Encounter: Payer: Self-pay | Admitting: Addiction (Substance Use Disorder)

## 2019-09-26 DIAGNOSIS — F0634 Mood disorder due to known physiological condition with mixed features: Secondary | ICD-10-CM | POA: Diagnosis not present

## 2019-09-26 NOTE — Progress Notes (Signed)
[facility] Counselor/Therapist Progress Note  Patient ID: Paul Bray, MRN: [id number],    Date: 09/26/2019  Time Spent: 1:16-2:00 25mins  Treatment Type: Individual Therapy  Reported Symptoms: frustration, sadness.   Mental Status Exam:  Appearance:   Casual     Behavior:  Appropriate  Motor:  Normal  Speech/Language:   Clear and Coherent  Affect:  Appropriate  Mood:  normal  Thought process:  circumstantial and concrete  Thought content:    WNL  Sensory/Perceptual disturbances:    WNL  Orientation:  x4  Attention:  Good  Concentration:  Good  Memory:  impaired by brain tumor  Fund of knowledge:   Good  Insight:    Good  Judgment:   Good  Impulse Control:  Good   Risk Assessment: Danger to Self:  No Self-injurious Behavior: No Danger to Others: No Duty to Warn:no Physical Aggression / Violence:No  Access to Firearms a concern: No  Gang Involvement:No  Substance use: No  Virtual Visit via Telephone Note I Connected with client by a video enabled telemedicine/telehealth application or telephone, with their informed consent, and verified client privacy and that I am speaking with the correct person using two identifiers. I discussed the limitations, risks, security and privacy concerns of performing psychotherapy and management service by telephone/teletherapy and the availability of in person appointments and confirmed their location. I also discussed with the patient that there may be a patient responsible charge related to this service and to confirm with the front desk if their insurance accepts teletherapy. The patient expressed understanding and agreed to proceed. I discussed the treatment planning with the client. The client was provided an opportunity to ask questions and all were answered. The client agreed with the plan and demonstrated an understanding of the instructions. The client was advised to call our office if symptoms worsen or feel they are in a  crisis state and need immediate contact. Client also reminded of a crisis line number and to use 9-1-1 if there's an emergency.  Therapist Location: office; Client Location: home.  Subjective: Client reported frustration and sadness in relation to his mom's early signs of dementia. Client processed the triggers of watching his mom deal with memory issues and it reminded him of his own grief related to memory issues due to his own medical condition: brain tumor. Therapist used MI and Grief therapy with client to support him in his grief and fears and help him process the inner conflicts related to this issue. Client processed the difficulty related to needing additional support, like a medical team, to help him take care of his MH/SUD. Client then processed him feeling betrayed by our office when he showed up to office and the doctor was gone- went home early due to being sick. Therapist used CBT and RPT with client to help him understand his thoughts and challenge his angry thoughts, to help him not give up building his doctor care team for his recovery/wellbeing.   Interventions: Cognitive Behavioral Therapy, Motivational Interviewing, Grief Therapy and RPT  Diagnosis:   ICD-10-CM   1. 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 Client is to continue seing medication provider for support of mood management, triggers and cravings.   Client to follow through with maintaining outside support for them in  their SUD txt: engaging in 12 step meetings and maintaining use of MAT for opioid use disorder.  Client to engage in [place] txt and RPT relapse prevention therapy AEB coming to therapy weekly and implementing recovery oriented coping strategies to help reduce use of substances and to find relief from symptoms of MH disorders  and trauma that cause them to want to escape from reality and numb their mind&body.  Client also to create more stability & structure AEB goal planning with therapist to assist in helping them get into a healthy rhythm/ schedule that can help to calm their nervous system and begin to build more healthy brain neuropathways.  Client to engage in CBT: challenging negative internal ruminationsand self-talk AEB expressing toxic thoughts and challenging them with truth.  Client to practice DBT distress tolerance skills (such as distress tolerance and emotion regulation) to practice achieving wise mind and to build support for dealing with cravings AEB learning to ride the wave of emotion/sensation/etc instead of seeking negative self-soothing techniques: ie using substances to numb self.  Client to utilize BSP (brainspotting) with therapist to help client identify and process triggers for their MH/ SUD with goal of reducing SUDs by 33% each session. Client to prioritize sleep 8+ hours each week night AEB going to bed by 10pm each night. Client participated in the treatment planning of their therapy. Client agreed with the plan and understands what to do if there is a crisis: call 9-1-1 and/or crisis line given by therapist.   Barnie Del, LCSW, LCAS, CCTP, CCS-I, BSP

## 2019-09-28 ENCOUNTER — Telehealth (HOSPITAL_COMMUNITY): Payer: Medicare Other | Admitting: Medical

## 2019-09-28 DIAGNOSIS — T66XXXS Radiation sickness, unspecified, sequela: Secondary | ICD-10-CM

## 2019-09-28 DIAGNOSIS — D330 Benign neoplasm of brain, supratentorial: Secondary | ICD-10-CM

## 2019-10-03 ENCOUNTER — Ambulatory Visit (INDEPENDENT_AMBULATORY_CARE_PROVIDER_SITE_OTHER): Payer: Medicare Other | Admitting: Addiction (Substance Use Disorder)

## 2019-10-03 ENCOUNTER — Encounter: Payer: Self-pay | Admitting: Addiction (Substance Use Disorder)

## 2019-10-03 DIAGNOSIS — F0634 Mood disorder due to known physiological condition with mixed features: Secondary | ICD-10-CM | POA: Diagnosis not present

## 2019-10-03 NOTE — Progress Notes (Signed)
[facility] Counselor/Therapist Progress Note  Patient ID: Paul Bray, MRN: [id number],    Date: 10/03/2019  Time Spent: 1:06-2:03 37mins  Treatment Type: Individual Therapy  Reported Symptoms: tired, but hopeful.    Mental Status Exam:  Appearance:   NA   - audio only visit.  Behavior:  Appropriate  Motor:  Normal  Speech/Language:   Clear and Coherent  Affect:  Appropriate  Mood:  normal  Thought process:  circumstantial and concrete  Thought content:    WNL  Sensory/Perceptual disturbances:    WNL  Orientation:  x4  Attention:  Good  Concentration:  Good  Memory:  impaired by brain tumor  Fund of knowledge:   Good  Insight:    Good  Judgment:   Good  Impulse Control:  Good   Risk Assessment: Danger to Self:  No Self-injurious Behavior: No Danger to Others: No Duty to Warn:no Physical Aggression / Violence:No  Access to Firearms a concern: No  Gang Involvement:No  Substance use: No- denied any substance use or old addictive behaviors.   Virtual Visit via telephone:  I connected with client by telephone with their informed consent, and verified client privacy and that I am speaking with the correct person using two identifiers. I discussed the limitations, risks, security and privacy concerns of performing psychotherapy and management service virtually and confirmed their location. I also discussed with the patient that there may be a patient responsible charge related to this service and to confirm with the front desk if their insurance covers teletherapy. I also discussed with the patient the availability of in person appointments. The patient expressed understanding and agreed to proceed. I discussed the treatment planning with the client. The client was provided an opportunity to ask questions and all were answered. The client agreed with the plan and demonstrated an understanding of the instructions. The client was advised to call our office if symptoms  worsen or feel they are in a crisis state and need immediate contact. Client also reminded of a crisis line number and to use 9-1-1 if there's an emergency.  Therapist Location: office; Client Location: home.  Subjective: Client reported his struggle with staying awake and memory function due to brain tumor. Client processed his frustration with his new normal, but asked therapist how to make peace with it. Therapist used MI, CBT, and mindfulness to affirm client's strength and vigor for life, help him change his negative hopeless thoughts, and to help him appreciate each moment of life. Client shared more about how this makes him realize how valuable his life is and how his cravings continue to decrease. Therapist used RPT with client to assess stability and sobriety and client denied SI/HI/AVh and denied strong cravings/temptations lately. Client still building techniques for helping him ride out stressors and or cravings and reviewed with therapist DBT skills like mindfulness and distress tolerance to help him not act impulsively if tempted. Client also discussed his team of medical providers who are helping him and his new-found member: his [place] sponsor, to increase his physical and recovery wellbeing.   Interventions: Cognitive Behavioral Therapy, Motivational Interviewing, Solution-Oriented/Positive Psychology and RPT  Diagnosis:   ICD-10-CM   1. 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 Client is to continue seing medication provider for support of  mood management, triggers and cravings.   Client to follow through with maintaining outside support for them in their SUD txt: engaging in 12 step meetings and maintaining use of MAT for opioid use disorder.  Client to engage in [place] txt and RPT relapse prevention  therapy AEB coming to therapy weekly and implementing recovery oriented coping strategies to help reduce use of substances and to find relief from symptoms of MH disorders and trauma that cause them to want to escape from reality and numb their mind&body.  Client also to create more stability & structure AEB goal planning with therapist to assist in helping them get into a healthy rhythm/ schedule that can help to calm their nervous system and begin to build more healthy brain neuropathways.  Client to engage in CBT: challenging negative internal ruminationsand self-talk AEB expressing toxic thoughts and challenging them with truth.  Client to practice DBT distress tolerance skills (such as distress tolerance and emotion regulation) to practice achieving wise mind and to build support for dealing with cravings AEB learning to ride the wave of emotion/sensation/etc instead of seeking negative self-soothing techniques: ie using substances to numb self.  Client to utilize BSP (brainspotting) with therapist to help client identify and process triggers for their MH/ SUD with goal of reducing SUDs by 33% each session. Client to prioritize sleep 8+ hours each week night AEB going to bed by 10pm each night. Client participated in the treatment planning of their therapy. Client agreed with the plan and understands what to do if there is a crisis: call 9-1-1 and/or crisis line given by therapist.   Barnie Del, LCSW, LCAS, CCTP, CCS-I, BSP

## 2019-10-05 ENCOUNTER — Ambulatory Visit: Payer: Medicare Other | Admitting: Physician Assistant

## 2019-10-10 ENCOUNTER — Encounter: Payer: Self-pay | Admitting: Addiction (Substance Use Disorder)

## 2019-10-10 ENCOUNTER — Ambulatory Visit (INDEPENDENT_AMBULATORY_CARE_PROVIDER_SITE_OTHER): Payer: Medicare Other | Admitting: Addiction (Substance Use Disorder)

## 2019-10-10 DIAGNOSIS — F1121 Opioid dependence, in remission: Secondary | ICD-10-CM

## 2019-10-10 DIAGNOSIS — F0634 Mood disorder due to known physiological condition with mixed features: Secondary | ICD-10-CM | POA: Diagnosis not present

## 2019-10-10 NOTE — Progress Notes (Signed)
[facility] Counselor/Therapist Progress Note  Patient ID: Paul Bray, MRN: [id number],    Date: 10/10/2019  Time Spent: 9:07-10:00 53 mins  Treatment Type: Individual Therapy  Reported Symptoms: busy trying to pay his bills and trying to not become discouraged.   Mental Status Exam:  Appearance:   Casual  Behavior:  Appropriate  Motor:  Normal  Speech/Language:   Clear and Coherent  Affect:  Appropriate  Mood:  normal and stressed  Thought process:  normal  Thought content:    WNL  Sensory/Perceptual disturbances:    WNL  Orientation:  x4  Attention:  Good  Concentration:  Good  Memory:  impaired by brain tumor  Fund of knowledge:   Good  Insight:    Good  Judgment:   Good  Impulse Control:  Good   Risk Assessment: Danger to Self:  No Self-injurious Behavior: No Danger to Others: No Duty to Warn:no Physical Aggression / Violence:No  Access to Firearms a concern: No  Gang Involvement:No  Substance use: No- denied any substance use or old addictive behaviors.   Virtual Visit via VIDEO: I connected with client by telemedicine/telehealth application because client was having trouble getting MyChart video to work. Client gave their informed consent, and verified privacy. I confirmed that I am speaking with the correct person using two identifiers. I discussed the limitations, risks, security and privacy concerns of performing psychotherapy and management service virtually and confirmed their location. I also discussed with the patient that there may be a patient responsible charge related to this service and to confirm with the front desk if their insurance covers teletherapy. I also discussed with the patient the availability of in person appointments. The patient expressed understanding and agreed to proceed. I discussed the treatment planning with the client. The client was provided an opportunity to ask questions and all were answered. The client agreed with the  plan and demonstrated an understanding of the instructions. The client was advised to call our office if symptoms worsen or feel they are in a crisis state and need immediate contact. Client also reminded of a crisis line number and to use 9-1-1 if there's an emergency.  Therapist Location: office; Client Location: home.  Subjective: Client reported his struggle with being busy trying to pay his bills and trying to not become discouraged. Therapist used MI & Rpt with client to validate his struggle and encourage hope for him. Client processed specifics about the bills and how his medical issues continue to be a challenge to care for, but his motivation for staying ontop of them and also staying sober. Therapist used 12 steps and RPT with client to further discuss how to help him stay healthy, including his attending a peer support class next week to keep him motivated in caring for his and others' MH & SUDs. Therapist assessed stability and sobriety and client denied SI/HI/AVH and denied cravings.   Interventions: Motivational Interviewing, Solution-Oriented/Positive Psychology and RPT & 12 steps  Diagnosis:   ICD-10-CM   1. 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 Opioid dependence in remission [facility])  F11.21     Plan of Care: Client to return for weekly therapy with Paul Bray, therapist, for outpatient therapy, to review again in 3-6 months. Client is to continue seing medication provider for support of mood management, triggers and cravings.   Client to follow through with maintaining outside support for them in their  SUD txt: engaging in 12 step meetings and maintaining use of MAT for opioid use disorder.  Client to engage in [place] txt and RPT relapse prevention therapy AEB coming to therapy weekly and implementing recovery oriented coping strategies to help reduce use of substances and to find relief from symptoms of MH disorders and trauma  that cause them to want to escape from reality and numb their mind&body.  Client also to create more stability & structure AEB goal planning with therapist to assist in helping them get into a healthy rhythm/ schedule that can help to calm their nervous system and begin to build more healthy brain neuropathways.  Client to engage in CBT: challenging negative internal ruminationsand self-talk AEB expressing toxic thoughts and challenging them with truth.  Client to practice DBT distress tolerance skills (such as distress tolerance and emotion regulation) to practice achieving wise mind and to build support for dealing with cravings AEB learning to ride the wave of emotion/sensation/etc instead of seeking negative self-soothing techniques: ie using substances to numb self.  Client to utilize BSP (brainspotting) with therapist to help client identify and process triggers for their MH/ SUD with goal of reducing SUDs by 33% each session. Client to prioritize sleep 8+ hours each week night AEB going to bed by 10pm each night. Client participated in the treatment planning of their therapy. Client agreed with the plan and understands what to do if there is a crisis: call 9-1-1 and/or crisis line given by therapist.   Paul Del, LCSW, LCAS, CCTP, CCS-I, BSP

## 2019-10-12 ENCOUNTER — Encounter: Payer: Self-pay | Admitting: Addiction (Substance Use Disorder)

## 2019-10-12 ENCOUNTER — Ambulatory Visit (INDEPENDENT_AMBULATORY_CARE_PROVIDER_SITE_OTHER): Payer: Medicare Other | Admitting: Addiction (Substance Use Disorder)

## 2019-10-12 ENCOUNTER — Encounter (HOSPITAL_COMMUNITY): Payer: Self-pay | Admitting: Medical

## 2019-10-12 ENCOUNTER — Telehealth (INDEPENDENT_AMBULATORY_CARE_PROVIDER_SITE_OTHER): Payer: Medicare Other | Admitting: Medical

## 2019-10-12 DIAGNOSIS — C719 Malignant neoplasm of brain, unspecified: Secondary | ICD-10-CM | POA: Diagnosis not present

## 2019-10-12 DIAGNOSIS — G4710 Hypersomnia, unspecified: Secondary | ICD-10-CM | POA: Diagnosis not present

## 2019-10-12 DIAGNOSIS — F1121 Opioid dependence, in remission: Secondary | ICD-10-CM | POA: Diagnosis not present

## 2019-10-12 DIAGNOSIS — F0634 Mood disorder due to known physiological condition with mixed features: Secondary | ICD-10-CM

## 2019-10-12 DIAGNOSIS — D330 Benign neoplasm of brain, supratentorial: Secondary | ICD-10-CM

## 2019-10-12 DIAGNOSIS — G4730 Sleep apnea, unspecified: Secondary | ICD-10-CM

## 2019-10-12 NOTE — Progress Notes (Signed)
[facility] Counselor/Therapist Progress Note  Patient ID: YANDEL ZEINER, MRN: [id number],    Date: 10/12/2019  Time Spent: 2:04-2:58 54 mins  Treatment Type: Individual Therapy  Reported Symptoms: tired and pushing through the struggle & loneliness.  Mental Status Exam:  Appearance:   Casual and Well Groomed  Behavior:  Appropriate  Motor:  Normal  Speech/Language:   Clear and Coherent  Affect:  Appropriate  Mood:  sad  Thought process:  normal  Thought content:    WNL  Sensory/Perceptual disturbances:    WNL  Orientation:  x4  Attention:  Good  Concentration:  Good  Memory:  impaired by brain tumor  Fund of knowledge:   Good  Insight:    Good  Judgment:   Good  Impulse Control:  Good   Risk Assessment: Danger to Self:  No Self-injurious Behavior: No Danger to Others: No Duty to Warn:no Physical Aggression / Violence:No  Access to Firearms a concern: No  Gang Involvement:No  Substance use: No- denied any substance use or old addictive behaviors.   Virtual Visit via VIDEO: I connected with client by telemedicine/telehealth application because client cannot figure out how to use MyChart- issue with allowing the video camera to work. Client gave their informed consent, and verified privacy. I confirmed that I am speaking with the correct person using two identifiers. I discussed the limitations, risks, security and privacy concerns of performing psychotherapy and management service virtually and confirmed their location. I also discussed with the patient that there may be a patient responsible charge related to this service and to confirm with the front desk if their insurance covers teletherapy. I also discussed with the patient the availability of in person appointments. The patient expressed understanding and agreed to proceed. I discussed the treatment planning with the client. The client was provided an opportunity to ask questions and all were answered. The  client agreed with the plan and demonstrated an understanding of the instructions. The client was advised to call our office if symptoms worsen or feel they are in a crisis state and need immediate contact. Client also reminded of a crisis line number and to use 9-1-1 if there's an emergency.  Therapist Location: office; Client Location: home.  Subjective: Client reported feeling sad lately and dealing with more loneliness. Client shared his struggle with being exhausted and shared more of his hx with sleeping on the job and how he got on disability. Client shared how this led to using substances and brought him to a low place and even now, he's trying not to get discouraged. Therapist used MI and CBT with client to affirm his progress and strengths/resilience, encourage his continued fight to stay healthy, validate the struggle, and to help client challenge self-defeating thoughts. Client is looking forward to going to a training for becoming a peer support, something he's very interested in doing to help others like himself in their recovery of MH/SUD. Therapist used RPT with client to encourage the use of his skills to help others, a key piece of the 12 steps, which would in turn encourage him.Client making progress in his motivation and outlook on life, no matter his medical difficulty. Therapist assessed for safety and sobriety and client denied SI/HI/AVH and cravings, but discussed more triggers until he felt more at peace about them. Client also denied any other ways he's living his life that would put his safety and sobriety at risk. Therapist used 12 steps with client to evaluate how  client is caring for himself.   Interventions: Cognitive Behavioral Therapy, Motivational Interviewing, Solution-Oriented/Positive Psychology and RPT & 12 steps  Diagnosis:   ICD-10-CM   1. 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 Opioid dependence in remission  ([place])  F11.21   3. 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 Hypersomnia with sleep apnea  G47.10    G47.30    Plan of Care: Client to return for weekly therapy with Sammuel Cooper, therapist, for outpatient therapy, to review again in 3-6 months. Client is to continue seing medication provider for support of mood management, triggers and cravings.   Client to follow through with maintaining outside support for them in their SUD txt: engaging in 12 step meetings and maintaining use of MAT for opioid use disorder.  Client to engage in [place] txt and RPT relapse prevention therapy AEB coming to therapy weekly and implementing recovery oriented coping strategies to help reduce use of substances and to find relief from symptoms of MH disorders and trauma that cause them to want to escape from reality and numb their mind&body.  Client also to create more stability & structure AEB goal planning with therapist to assist in helping them get into a healthy rhythm/ schedule that can help to calm their nervous system and begin to build more healthy brain neuropathways.  Client to engage in CBT: challenging negative internal ruminationsand self-talk AEB expressing toxic thoughts and challenging them with truth.  Client to practice DBT distress tolerance skills (such as distress tolerance and emotion regulation) to practice achieving wise mind and to build support for dealing with cravings AEB learning to ride the wave of emotion/sensation/etc instead of seeking negative self-soothing techniques: ie using substances to numb self.  Client to utilize BSP (brainspotting) with therapist to help client identify and process triggers for their MH/ SUD with goal of reducing SUDs by 33% each session. Client to prioritize sleep 8+ hours each week night AEB going to bed by 10pm each night. Client participated in the treatment planning of their therapy. Client agreed with the plan and understands what to do if there is a crisis:  call 9-1-1 and/or crisis line given by therapist.  Barnie Del, LCSW, LCAS, CCTP, CCS-I, BSP

## 2019-10-12 NOTE — Progress Notes (Signed)
Patient ID: Paul Bray, male   DOB: 1965/05/21, 54 y.o.   MRN: [id number]  Virtual Visit via Video Note  I connected with Paul Bray on 10/12/19 at  3:00 PM EDT by a video enabled telemedicine application and verified that I am speaking with the correct person using two identifiers.   I discussed the limitations of evaluation and management by telemedicine and the availability of in person appointments. The patient expressed understanding and agreed to proceed.  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 At the time of his initial complaint he was found to have a small tumor for which he underwent Gamma knife radiation x 1 .Repeat MRI 2020: 1. 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 No hydrocephalus. Attention on follow up imaging advised.  2. To note, for future follow up imaging consider placing an IV line access before patient undergoes contrast enhanced imaging.  3. Remote lacunar infarcts in the left basal ganglia.-(He was unaware of this finding)  He is being treated with 25 mg Adderall and Modonafil without effect Assoiated .he c/o of recurrent skin rash unresponsive to standard care. Endocrine evaluation ? Does have hypothyroid medication 112 mcg synthroid. He is miserable  HE HAS NOT FILLED OUT HIS PAPERWORK AS HE FELL ASLEEP FOR HIS APPT LAST WEEK AND DID NOT RETURN CONTACT FROM OFFICE> TODAY HE PLANNED HIS AWAKE PERIOD AROUND THIS VISIT>     Observations/Objective: Appears to have just gotten up. He is alert oriented appropriate and comprehensible   Assessment: NEEDS TO ESTABLISH CARE/FILL OUT PAPERWORK/Sign 1 ROI  and Plan: PAPERWORK FAXED including ROI downloaded from [facility]. To FU in Person 1 week   Follow Up Instructions: To FU in Person 1 week and  bring Paperwork and do Initial Assessment..Recommended he see Dr Nevada Crane for Dermatology.     I discussed the assessment and treatment plan with the patient. The patient was provided an opportunity to ask questions and all were answered. The patient agreed with the plan and demonstrated an understanding of the instructions.   The patient was advised to call back or seek an in-person evaluation if the symptoms worsen or if the condition fails to improve as anticipated.  I provided 20 minutes of non-face-to-face time during this encounter. Pt was seen at home from [place] on My Chart Visit was 20 minute interview to gather baseline information for formal intake assessment and further review of outside records   Paul Russian, PA-C

## 2019-10-18 ENCOUNTER — Ambulatory Visit (INDEPENDENT_AMBULATORY_CARE_PROVIDER_SITE_OTHER): Payer: Medicare Other | Admitting: Addiction (Substance Use Disorder)

## 2019-10-18 ENCOUNTER — Encounter: Payer: Self-pay | Admitting: Addiction (Substance Use Disorder)

## 2019-10-18 DIAGNOSIS — D330 Benign neoplasm of brain, supratentorial: Secondary | ICD-10-CM | POA: Diagnosis not present

## 2019-10-18 DIAGNOSIS — F0634 Mood disorder due to known physiological condition with mixed features: Secondary | ICD-10-CM

## 2019-10-18 NOTE — Progress Notes (Signed)
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  Mental Status Exam:  Appearance:   Casual and Well Groomed  Behavior:  Appropriate  Motor:  Normal  Speech/Language:   Clear and Coherent  Affect:  Appropriate  Mood:  normal  Thought process:  normal  Thought content:    WNL  Sensory/Perceptual disturbances:    WNL  Orientation:  x4  Attention:  Good  Concentration:  Good  Memory:  impaired by brain tumor  Fund of knowledge:   Good  Insight:    Good  Judgment:   Good  Impulse Control:  Good   Risk Assessment: Danger to Self:  No Self-injurious Behavior: No Danger to Others: No Duty to Warn:no Physical Aggression / Violence:No  Access to Firearms a concern: No  Gang Involvement:No  Substance use: No- denied any substance use or old addictive behaviors.   Virtual Visit via VIDEO: I connected with client by telemedicine/telehealth application because client cannot get on MyChart & hasn't called IT. Client gave their informed consent, and verified privacy. I confirmed that I am speaking with the correct person using two identifiers. I discussed the limitations, risks, security and privacy concerns of performing psychotherapy and management service virtually and confirmed their location. I also discussed with the patient that there may be a patient responsible charge related to this service and to confirm with the front desk if their insurance covers teletherapy. I also discussed with the patient the availability of in person appointments. The patient expressed understanding and agreed to proceed. I discussed the treatment planning with the client. The client was provided an opportunity to ask questions and all were answered. The client agreed  with the plan and demonstrated an understanding of the instructions. The client was advised to call our office if symptoms worsen or feel they are in a crisis state and need immediate contact. Client also reminded of a crisis line number and to use 9-1-1 if there's an emergency.  Therapist Location: office; Client Location: home.  Subjective: Client reported feeling lonely lately and a push to get involved in community. Client described his challenge with his medical issues and esp with COVID to engage with others, and have any physical touch: even a hug from a friend. Therapist used MI& Grief therapy with client to validate the struggle with that and explore with client the meaning of that physical affection he craves. Therapist also working to help client grieve the changes in his life as a result of his brain tumor and side effects of it. Client discussed things he's learned along the way when he made mistakes and how he accepted that easier than things he has no control over: how this tumor took his money and livelihood by taking his job, memory, and more.Client engaged in training for peer support specialist to help him find a way to help others and work for some more income and is excited by the opportunity. Client reported stability and sobriety and still taking all his medications. Therapist assessed and client denied SI/HI/AVH.   Interventions: Cognitive Behavioral Therapy, Motivational Interviewing, Solution-Oriented/Positive Psychology and RPT & 12 steps  Diagnosis:   ICD-10-CM   1. 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 Client is to continue seing medication provider for support of mood management, triggers and cravings.   Client to  follow through with maintaining outside support for them in their SUD txt: engaging in 12 step meetings and maintaining use of MAT for opioid use disorder.  Client to engage in [place] txt and RPT relapse prevention therapy AEB coming to therapy weekly and implementing recovery oriented coping strategies to help reduce use of substances and to find relief from symptoms of MH disorders and trauma that cause them to want to escape from reality and numb their mind&body.  Client also to create more stability & structure AEB goal planning with therapist to assist in helping them get into a healthy rhythm/ schedule that can help to calm their nervous system and begin to build more healthy brain neuropathways.  Client to engage in CBT: challenging negative internal ruminationsand self-talk AEB expressing toxic thoughts and challenging them with truth.  Client to practice DBT distress tolerance skills (such as distress tolerance and emotion regulation) to practice achieving wise mind and to build support for dealing with cravings AEB learning to ride the wave of emotion/sensation/etc instead of seeking negative self-soothing techniques: ie using substances to numb self.  Client to utilize BSP (brainspotting) with therapist to help client identify and process triggers for their MH/ SUD with goal of reducing SUDs by 33% each session. Client to prioritize sleep 8+ hours each week night AEB going to bed by 10pm each night. Client participated in the treatment planning of their therapy. Client agreed with the plan and understands what to do if there is a crisis: call 9-1-1 and/or crisis line given by therapist.  Barnie Del, LCSW, LCAS, CCTP, CCS-I, BSP

## 2019-10-19 ENCOUNTER — Ambulatory Visit (HOSPITAL_COMMUNITY): Payer: Medicare Other | Admitting: Medical

## 2019-10-21 ENCOUNTER — Other Ambulatory Visit (HOSPITAL_COMMUNITY): Payer: Self-pay | Admitting: Medical

## 2019-10-24 ENCOUNTER — Ambulatory Visit (INDEPENDENT_AMBULATORY_CARE_PROVIDER_SITE_OTHER): Payer: Medicare Other | Admitting: Addiction (Substance Use Disorder)

## 2019-10-24 DIAGNOSIS — F0634 Mood disorder due to known physiological condition with mixed features: Secondary | ICD-10-CM

## 2019-10-24 NOTE — Progress Notes (Signed)
[facility] Counselor/Therapist Progress Note  Patient ID: Paul Bray, MRN: [id number],    Date: 10/24/2019  Time Spent: 10:06-11:01 54 mins  Treatment Type: Individual Therapy  Reported Symptoms: frustrated, upset, helpless.   Mental Status Exam:  Appearance:   Casual and Well Groomed  Behavior:  Appropriate  Motor:  Normal  Speech/Language:   Clear and Coherent  Affect:  Appropriate  Mood:  normal  Thought process:  normal  Thought content:    WNL  Sensory/Perceptual disturbances:    WNL  Orientation:  x4  Attention:  Good  Concentration:  Good  Memory:  impaired by brain tumor  Fund of knowledge:   Good  Insight:    Good  Judgment:   Good  Impulse Control:  Good   Risk Assessment: Danger to Self:  No Self-injurious Behavior: No Danger to Others: No Duty to Warn:no Physical Aggression / Violence:No  Access to Firearms a concern: No  Gang Involvement:No  Substance use: No- denied any substance use and addictive behaviors.    Virtual Visit via VIDEO: I connected with client by alternative telemedicine/telehealth application. Client gave their informed consent, and verified privacy. I confirmed that I am speaking with the correct person using two identifiers. I discussed the limitations, risks, security and privacy concerns of performing psychotherapy and management service virtually and confirmed their location. I also discussed with the patient that there may be a patient responsible charge related to this service and to confirm with the front desk if their insurance covers teletherapy. I also discussed with the patient the availability of in person appointments. The patient expressed understanding and agreed to proceed. I discussed the treatment planning with the client. The client was provided an opportunity to ask questions and all were answered. The client agreed with the plan and demonstrated an understanding of the instructions. The client was advised to  call our office if symptoms worsen or feel they are in a crisis state and need immediate contact. Client also reminded of a crisis line number and to use 9-1-1 if there's an emergency.  Therapist Location: office; Client Location: home.  Subjective: Client reported feeling frustrated, upset, and helpless with getting the meds he needs. Client shared difficulties hes had and obstacles he goes through to take care of his phsycial and mental health. Therapist used MI and RPT with client to offer support, validation for his frustration, but also encouragement to help him figure out self care needs. Therapist also used SFT with client to help him find a specific solution for getting his meds from a pharmacy nearby who can delier to him to keep him from having to pay a taxi fee to get to the pharmacy. Therapist assessed and client denied SI/HI/AVH.   Interventions: Motivational Interviewing, Solution-Oriented/Positive Psychology and RPT  Diagnosis:   ICD-10-CM   1. 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 Client is to continue seing medication provider for support of mood management, triggers and cravings.   Client to follow through with maintaining outside support for them in their SUD txt: engaging in 12 step meetings and maintaining use of MAT for opioid use disorder.  Client to engage in [place] txt and RPT relapse prevention therapy AEB coming to therapy weekly and implementing recovery oriented coping strategies to help reduce use  of substances and to find relief from symptoms of MH disorders and trauma that cause them to want to escape from reality and numb their mind&body.  Client also to create more stability & structure AEB goal planning with therapist to assist in helping them get into a healthy rhythm/ schedule that  can help to calm their nervous system and begin to build more healthy brain neuropathways.  Client to engage in CBT: challenging negative internal ruminationsand self-talk AEB expressing toxic thoughts and challenging them with truth.  Client to practice DBT distress tolerance skills (such as distress tolerance and emotion regulation) to practice achieving wise mind and to build support for dealing with cravings AEB learning to ride the wave of emotion/sensation/etc instead of seeking negative self-soothing techniques: ie using substances to numb self.  Client to utilize BSP (brainspotting) with therapist to help client identify and process triggers for their MH/ SUD with goal of reducing SUDs by 33% each session. Client to prioritize sleep 8+ hours each week night AEB going to bed by 10pm each night. Client participated in the treatment planning of their therapy. Client agreed with the plan and understands what to do if there is a crisis: call 9-1-1 and/or crisis line given by therapist.  Barnie Del, LCSW, LCAS, CCTP, CCS-I, BSP

## 2019-10-25 ENCOUNTER — Encounter: Payer: Self-pay | Admitting: Addiction (Substance Use Disorder)

## 2019-10-31 ENCOUNTER — Ambulatory Visit: Payer: Medicare Other | Admitting: Addiction (Substance Use Disorder)

## 2019-11-01 ENCOUNTER — Telehealth (HOSPITAL_COMMUNITY): Payer: Self-pay | Admitting: Medical

## 2019-11-01 ENCOUNTER — Encounter: Payer: Self-pay | Admitting: Addiction (Substance Use Disorder)

## 2019-11-01 ENCOUNTER — Ambulatory Visit (INDEPENDENT_AMBULATORY_CARE_PROVIDER_SITE_OTHER): Payer: Medicare Other | Admitting: Addiction (Substance Use Disorder)

## 2019-11-01 DIAGNOSIS — F0634 Mood disorder due to known physiological condition with mixed features: Secondary | ICD-10-CM | POA: Diagnosis not present

## 2019-11-01 DIAGNOSIS — D330 Benign neoplasm of brain, supratentorial: Secondary | ICD-10-CM

## 2019-11-01 DIAGNOSIS — F1121 Opioid dependence, in remission: Secondary | ICD-10-CM

## 2019-11-01 DIAGNOSIS — G4710 Hypersomnia, unspecified: Secondary | ICD-10-CM

## 2019-11-01 NOTE — Progress Notes (Signed)
Note in error.

## 2019-11-01 NOTE — Progress Notes (Signed)
[facility] Counselor/Therapist Progress Note  Patient ID: Paul Bray, MRN: [id number],    Date: 11/01/2019  Time Spent: 10:32-11:15 75mns  Treatment Type: Individual Therapy  Reported Symptoms: craving, anxious, despairing.   Mental Status Exam:  Appearance:   Casual and Well Groomed  Behavior:  Appropriate  Motor:  Normal  Speech/Language:   Clear and Coherent  Affect:  Appropriate  Mood:  anxious, irritable and sad  Thought process:  normal  Thought content:    WNL  Sensory/Perceptual disturbances:    WNL  Orientation:  x4  Attention:  Good  Concentration:  Fair  Memory:  impaired by brain tumor  Fund of knowledge:   Good  Insight:    Good  Judgment:   Good  Impulse Control:  Fair- decreased as things worsen with brain tumor   Risk Assessment: Danger to Self:  No Self-injurious Behavior: No Danger to Others: No Duty to Warn:no Physical Aggression / Violence:No  Access to Firearms a concern: No  Gang Involvement:No  Substance use: No- denied any substance use and addictive behaviors.    Virtual Visit via VIDEO: [facility]. Client gave their informed consent, and verified privacy. I confirmed that I am speaking with the correct person using two identifiers. I discussed the limitations, risks, security and privacy concerns of performing psychotherapy and management service virtually and confirmed their location. I also discussed with the patient that there may be a patient responsible charge related to this service and to confirm with the front desk if their insurance covers teletherapy. I also discussed with the patient the availability of in person appointments. The patient expressed understanding and agreed to proceed. I discussed the treatment planning with the client. The client was provided an opportunity to ask questions and all were answered. The client agreed with the plan and demonstrated an  understanding of the instructions. The client was advised to call our office if symptoms worsen or feel they are in a crisis state and need immediate contact. Client also reminded of a crisis line number and to use 9-1-1 if there's an emergency.  Therapist Location: office; Client Location: home.  Subjective: Client reported feeling craving, anxious, despairing. Client processed his thoughts of fearing death as his brain tumor worsens. Client shared his fears in relation to his desires to live and succeed in his life endeavors. Therapist used MI to validate client's fears of what's to come with his tumor and grief therapy to help him continue to process grief and his thoughts for his life's meaning. Therapist also taught client DBT techniques to help him work on increasing his strength to resist using and MET to help client gain increased motivation to stay sober by coping with his triggers and challenging thoughts that cause him despair (CBT). Client made progress in session and verbally confirmed his double-mindedness and his desire to use his spirituality to help him fight the urges to use and challenge dark thoughts with truth (RPT). Therapist assessed client for safety and client denied SI/HI/AVH.   Interventions: Dialectical Behavioral Therapy, Motivational Interviewing, Grief Therapy and RPT & MET  Diagnosis:   ICD-10-CM   1. 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 [place]  F11.21   3. 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 Client is to continue seing medication provider for support of mood management, triggers and cravings.   Client to follow through with maintaining outside support for them in their SUD txt: engaging in 12 step meetings and  maintaining use of MAT for opioid use disorder.  Client to engage in [place] txt and RPT relapse prevention therapy AEB coming to therapy weekly and implementing recovery oriented coping strategies to help reduce use of substances and to find relief from symptoms of MH disorders and trauma that cause them to want to escape from reality and numb their mind&body.  Client also to create more stability & structure AEB goal planning with therapist to assist in helping them get into a healthy rhythm/ schedule that can help to calm their nervous system and begin to build more healthy brain neuropathways.  Client to engage in CBT: challenging negative internal ruminationsand self-talk AEB expressing toxic thoughts and challenging them with truth.  Client to practice DBT distress tolerance skills (such as distress tolerance and emotion regulation) to practice achieving wise mind and to build support for dealing with cravings AEB learning to ride the wave of emotion/sensation/etc instead of seeking negative self-soothing techniques: ie using substances to numb self.  Client to utilize BSP (brainspotting) with therapist to help client identify and process triggers for their MH/ SUD with goal of reducing SUDs by 33% each session. Client to prioritize sleep 8+ hours each week night AEB going to bed by 10pm each night. Client participated in the treatment planning of their therapy. Client agreed with the plan and understands what to do if there is a crisis: call 9-1-1 and/or crisis line given by therapist.  Barnie Del, LCSW, LCAS, CCTP, CCS-I, BSP

## 2019-11-07 ENCOUNTER — Encounter: Payer: Self-pay | Admitting: Addiction (Substance Use Disorder)

## 2019-11-07 ENCOUNTER — Ambulatory Visit (INDEPENDENT_AMBULATORY_CARE_PROVIDER_SITE_OTHER): Payer: Medicare Other | Admitting: Addiction (Substance Use Disorder)

## 2019-11-07 DIAGNOSIS — C719 Malignant neoplasm of brain, unspecified: Secondary | ICD-10-CM

## 2019-11-07 DIAGNOSIS — G4730 Sleep apnea, unspecified: Secondary | ICD-10-CM

## 2019-11-07 DIAGNOSIS — G4710 Hypersomnia, unspecified: Secondary | ICD-10-CM

## 2019-11-07 DIAGNOSIS — F1121 Opioid dependence, in remission: Secondary | ICD-10-CM | POA: Diagnosis not present

## 2019-11-07 NOTE — Progress Notes (Signed)
[facility] Counselor/Therapist Progress Note  Patient ID: Paul Bray, MRN: [id number],    Date: 11/07/2019  Time Spent: 1:09-2:12 63 mins  Treatment Type: Individual Therapy  Reported Symptoms: pensive, fearful of dying.    Mental Status Exam:  Appearance:   Fairly Groomed  Behavior:  Appropriate  Motor:  Normal  Speech/Language:   Clear and Coherent  Affect:  Appropriate  Mood:  anxious  Thought process:  normal  Thought content:    WNL  Sensory/Perceptual disturbances:    WNL  Orientation:  x4  Attention:  Good  Concentration:  Fair  Memory:  impaired by brain tumor  Fund of knowledge:   Good  Insight:    Good  Judgment:   Good  Impulse Control:  Fair- decreased as things worsen with brain tumor   Risk Assessment: Danger to Self:  No Self-injurious Behavior: No Danger to Others: No Duty to Warn:no Physical Aggression / Violence:No  Access to Firearms a concern: No  Gang Involvement:No  Substance use: No- denied any substance use and addictive behaviors.    Virtual Visit via VIDEO: I connected with client by an alternative telemedicine/telehealth application, due to notification on mychart about not having access to the camera/microphone, despite therapist's and client's efforts to fix the issue. Client gave their informed consent, and verified privacy. I confirmed that I am speaking with the correct person using two identifiers. I discussed the limitations, risks, security and privacy concerns of performing psychotherapy and management service virtually and confirmed their location. I also discussed with the patient that there may be a patient responsible charge related to this service and to confirm with the front desk if their insurance covers teletherapy. I also discussed with the patient the availability of in person appointments. The client expressed understanding and agreed to proceed. I discussed the treatment planning with the client. The client was  provided an opportunity to ask questions and all were answered. The client agreed with the plan and demonstrated an understanding of the instructions. The client was advised to call our office if symptoms worsen or feel they are in a crisis state and need immediate contact. Client also reminded of a crisis line number and to use 9-1-1 if there's an emergency.  Therapist Location: office; Client Location: home.  Subjective: Client discussed his thoughts about last week's session when he realized he was "a double-minded man" and realized why he feels the desire to stay sober and live a respectible life along with a desire to get high and live out his last days partying. Therapist used MI & MET to help validate the client's feelings, roll with ambivalence about completely choosing sobriety/surrendering a need to satisfy the cravings, and to help encourage the client towards growth/sanctification as he endures suffering with his brain tumor and becomes more steadfast in his sober, new life. Therapist also acknowledged client's real fears of dying and used CBT & grief therapy with client to discuss his thoughts and help client make a bit of peace with the unknown of death due to a brain tumor as he looks forward to his hope: "a new body and brain one day". Therapist assessed for safety and client's desire to live and client denied all SI/HI/AVH and any desire to die. Client reported no fear of suffering because he is doing it now and is still finding ways to cope and stay healthy in his recovery, just a fear of dying and nobody remembering him/missing him. Therapist used affirmations  to remind client of the impact he has on the lives of others around him and the validity of the belief of the client's that: "his life isn't in vain because he belongs to [name]; that his life is full of meaning and hes part of a bigger story". Therapist further used RPT techniques with client to verbally ask client's next goals in his  life that are achievable and motivate him to continue working on himself.   Interventions: Cognitive Behavioral Therapy, Motivational Interviewing, Grief Therapy and RPT  Diagnosis:   ICD-10-CM   1. Opioid dependence in remission ([place])  F11.21   2. Hypersomnia with sleep apnea  G47.10    G47.30   3. 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 Client is to continue seing medication provider for support of mood management, triggers and cravings.   Client to follow through with maintaining outside support for them in their SUD txt: engaging in 12 step meetings and maintaining use of MAT for opioid use disorder.  Client to engage in [place] txt and RPT relapse prevention therapy AEB coming to therapy weekly and implementing recovery oriented coping strategies to help reduce use of substances and to find relief from symptoms of MH disorders and trauma that cause them to want to escape from reality and numb their mind&body.  Client also to create more stability & structure AEB goal planning with therapist to assist in helping them get into a healthy rhythm/ schedule that can help to calm their nervous system and begin to build more healthy brain neuropathways.  Client to engage in CBT: challenging negative internal ruminationsand self-talk AEB expressing toxic thoughts and challenging them with truth.  Client to practice DBT distress tolerance skills (such as distress tolerance and emotion regulation) to practice achieving wise mind and to build support for dealing with cravings AEB learning to ride the wave of emotion/sensation/etc instead of seeking negative self-soothing techniques: ie using substances to numb self.  Client to utilize BSP (brainspotting) with therapist to help client identify and process triggers for their MH/ SUD with goal of reducing SUDs by 33% each  session. Client to prioritize sleep 8+ hours each week night AEB going to bed by 10pm each night. Client participated in the treatment planning of their therapy. Client agreed with the plan and understands what to do if there is a crisis: call 9-1-1 and/or crisis line given by therapist.  Barnie Del, LCSW, LCAS, CCTP, CCS-I, BSP

## 2019-11-09 ENCOUNTER — Encounter (HOSPITAL_COMMUNITY): Payer: Self-pay | Admitting: Medical

## 2019-11-09 ENCOUNTER — Telehealth (INDEPENDENT_AMBULATORY_CARE_PROVIDER_SITE_OTHER): Payer: Medicare Other | Admitting: Medical

## 2019-11-09 DIAGNOSIS — Z923 Personal history of irradiation: Secondary | ICD-10-CM | POA: Diagnosis not present

## 2019-11-09 DIAGNOSIS — D330 Benign neoplasm of brain, supratentorial: Secondary | ICD-10-CM

## 2019-11-09 DIAGNOSIS — F1021 Alcohol dependence, in remission: Secondary | ICD-10-CM

## 2019-11-09 DIAGNOSIS — G4710 Hypersomnia, unspecified: Secondary | ICD-10-CM | POA: Diagnosis not present

## 2019-11-09 DIAGNOSIS — F0634 Mood disorder due to known physiological condition with mixed features: Secondary | ICD-10-CM

## 2019-11-09 DIAGNOSIS — F1121 Opioid dependence, in remission: Secondary | ICD-10-CM | POA: Diagnosis not present

## 2019-11-09 DIAGNOSIS — G4730 Sleep apnea, unspecified: Secondary | ICD-10-CM

## 2019-11-09 NOTE — Progress Notes (Signed)
Virtual Visit via Video Note  I connected with Paul Bray on 11/09/19 at  4:00 PM EDT by a video enabled telemedicine application from [place] OP to pt at home and verified that I am speaking with the correct person using two identifiers.   I discussed the limitations of evaluation and management by telemedicine and the availability of in person appointments. The patient expressed understanding and agreed to proceed. 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 There is ? If Pituatry structures may have been irradiated as well.He has not had his blood work drawn yet. He has not gotten disc of earlier MRI for comparison. Paper work is still pending. Pt also expresses concerns for his sobriety with ongoing stress. He has completed his Peer review program  Observations/Objective:Care Everywhere records are reviewed in detail for dates and materials needed (Discs of MRIs/Radiation records) Mental Status Examination   Appearance: NA Alert: Yes Attention: Good Cooperative: Yes Eye Contact: NA Speech:Spontaneous normal in volume, rate, tone Psychomotor Activity: NA Memory/Concentration: OK Oriented: person, place and situation Mood: Anxious Affect: Congruent and Full Range Thought Processes and Associations: Goal Directed and Intact No SI/HI Fund of Knowledge: Fair Thought Content: Suicidal ideation, Homicidal ideation, Auditory hallucinations, Visual hallucinations, Delusions and Paranoia, none reported Insight: Limited Judgement: Limited Language:  WDL  Assessment and Plan: Somatic and psychological complaints ? Etiology.Gather data to try and determine cause(s)    Follow Up Instructions:  I discussed the assessment and treatment plan with the patient. The patient was provided an opportunity to ask questions and all were answered. The patient agreed with the plan and demonstrated an understanding of the instructions.   The patient was advised to call back or seek an in-person evaluation if the symptoms worsen or if the condition fails to improve as anticipated.  I provided 30 minutes of non-face-to-face time during this encounter.   [name], PA-C

## 2019-11-09 NOTE — Progress Notes (Signed)
Psychiatric Initial Adult Assessment   Patient Identification: Paul Bray MRN:  [id number] Date of Evaluation:  11/09/2019 Referral Source: Chief Complaint:   Visit Diagnosis: No diagnosis found.  History of Present Illness:  Associated Signs/Sympto  Past Psychiatric History:   Previous Psychotropic Medications:  Substance Abuse History in the last 12 months:    Consequences of Substance Abuse:   Past Medical History:  Past Medical History:  Diagnosis Date  . ADHD   . Brain tumor ([place])    balance and occ memory issues and headaches  . Brain tumor ([place])    sees [facility] q year  . Headache    migraine and tension  . Hypothyroidism   . Soft tissue mass    left shoulder  . Substance abuse [facility])     Past Surgical History:  Procedure Laterality Date  . colonscopy    . gamma knife radiation 2018 for brain tumor'    . hematoma removed from arm Left   . MASS EXCISION Left 06/30/2019   Procedure: EXCISION OF SOFT TISSUE  MASS LEFT SHOULDER;  Surgeon: Armandina Gemma, MD;  Location: [place];  Service: General;  Laterality: Left;  LMA    Family Psychiatric History:   Family History: No family history on file.  Social History:   Social History   Socioeconomic History  . Marital status: Single    Spouse name: Not on file  . Number of children: Not on file  . Years of education: Not on file  . Highest education level: Not on file  Occupational History  . Not on file  Tobacco Use  . Smoking status: Current Every Day Smoker    Types: Cigarettes  . Smokeless tobacco: Never Used  . Tobacco comment: quit jan 2020  Vaping Use  . Vaping Use: Never used  Substance and Sexual Activity  . Alcohol use: Not Currently    Comment: quit oct 2019  . Drug use: Yes    Types: Cocaine, IV    Comment: last used cocaine 2019 per pt  . Sexual activity: Not on file  Other Topics Concern  . Not on file  Social History Narrative  . Not on file   Social  Determinants of Health   Financial Resource Strain:   . Difficulty of Paying Living Expenses:   Food Insecurity:   . Worried About Charity fundraiser in the Last Year:   . Arboriculturist in the Last Year:   Transportation Needs:   . Film/video editor (Medical):   Marland Kitchen Lack of Transportation (Non-Medical):   Physical Activity:   . Days of Exercise per Week:   . Minutes of Exercise per Session:   Stress:   . Feeling of Stress :   Social Connections:   . Frequency of Communication with Friends and Family:   . Frequency of Social Gatherings with Friends and Family:   . Attends Religious Services:   . Active Member of Clubs or Organizations:   . Attends Archivist Meetings:   Marland Kitchen Marital Status:     Additional Social History:   Allergies:  No Known Allergies  Metabolic Disorder Labs: No results found for: HGBA1C, MPG No results found for: PROLACTIN No results found for: CHOL, TRIG, HDL, CHOLHDL, VLDL, LDLCALC Lab Results  Component Value Date   TSH 2.166 01/09/2018    Therapeutic Level Labs: No results found for: LITHIUM No results found for: CBMZ No results found for: VALPROATE  Current Medications: Current Outpatient Medications  Medication Sig Dispense Refill  . acetaminophen (TYLENOL) 500 MG tablet Take 2,000 mg by mouth 2 (two) times daily as needed for headache (pain).     Marland Kitchen amphetamine-dextroamphetamine (ADDERALL XR) 25 MG 24 hr capsule     . benztropine (COGENTIN) 1 MG tablet Take 1 mg by mouth 2 (two) times daily.    . buprenorphine (SUBUTEX) 8 MG SUBL SL tablet Place 8 mg under the tongue 3 (three) times daily.    . cephALEXin (KEFLEX) 500 MG capsule Take 500 mg by mouth 2 (two) times daily.    . clindamycin (CLEOCIN T) 1 % SWAB Apply 1 each topically 2 (two) times daily.    Marland Kitchen doxycycline (VIBRAMYCIN) 50 MG capsule     . gabapentin (NEURONTIN) 300 MG capsule Take 300 mg by mouth 2 (two) times daily as needed.    . hydrOXYzine (ATARAX/VISTARIL) 10  MG tablet Take 10 mg by mouth as needed.    Marland Kitchen levothyroxine (SYNTHROID, LEVOTHROID) 112 MCG tablet Take 112 mcg by mouth daily before breakfast.    . metroNIDAZOLE (METROGEL) 0.75 % gel Apply 1 application topically at bedtime.    . risperiDONE (RISPERDAL) 1 MG tablet Take 1 mg by mouth at bedtime.    . risperiDONE (RISPERDAL) 2 MG tablet Take 2 mg by mouth 2 (two) times daily.    . tazarotene (AVAGE) 0.1 % cream Apply 1 application topically daily.    Marland Kitchen topiramate (TOPAMAX) 100 MG tablet Take 100 mg by mouth at bedtime.     No current facility-administered medications for this visit.    MusculoskScreenings:   Assessment and Plan: SEE [facility]   [name], PA-C 7/15/20213:15 PM

## 2019-11-15 ENCOUNTER — Ambulatory Visit (INDEPENDENT_AMBULATORY_CARE_PROVIDER_SITE_OTHER): Payer: Medicare Other | Admitting: Addiction (Substance Use Disorder)

## 2019-11-15 ENCOUNTER — Ambulatory Visit: Payer: Medicare Other | Admitting: Addiction (Substance Use Disorder)

## 2019-11-15 ENCOUNTER — Encounter: Payer: Self-pay | Admitting: Addiction (Substance Use Disorder)

## 2019-11-15 DIAGNOSIS — F0634 Mood disorder due to known physiological condition with mixed features: Secondary | ICD-10-CM
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
## 2019-11-22 ENCOUNTER — Ambulatory Visit (INDEPENDENT_AMBULATORY_CARE_PROVIDER_SITE_OTHER): Payer: Medicare Other | Admitting: Addiction (Substance Use Disorder)

## 2019-11-22 ENCOUNTER — Encounter: Payer: Self-pay | Admitting: Addiction (Substance Use Disorder)

## 2019-11-22 DIAGNOSIS — C719 Malignant neoplasm of brain, unspecified: Secondary | ICD-10-CM

## 2019-11-22 DIAGNOSIS — F1121 Opioid dependence, in remission: Secondary | ICD-10-CM

## 2019-11-22 DIAGNOSIS — G4710 Hypersomnia, unspecified: Secondary | ICD-10-CM

## 2019-11-22 DIAGNOSIS — F0634 Mood disorder due to known physiological condition with mixed features: Secondary | ICD-10-CM

## 2019-11-22 DIAGNOSIS — G4730 Sleep apnea, unspecified: Secondary | ICD-10-CM

## 2019-11-22 NOTE — Progress Notes (Signed)
[facility] Counselor/Therapist Progress Note  Patient ID: Paul Bray, MRN: [id number],    Date: 11/22/2019  Time Spent: 71mins  Treatment Type: Individual Therapy  Reported Symptoms: discouraged, tired.   Mental Status Exam:  Appearance:   Fairly Groomed and Neat  Behavior:  Appropriate  Motor:  Normal  Speech/Language:   Clear and Coherent  Affect:  Appropriate and Congruent  Mood:  normal  Thought process:  normal  Thought content:    WNL  Sensory/Perceptual disturbances:    WNL  Orientation:  x4  Attention:  Good  Concentration:  Fair  Memory:  impaired by brain tumor  Fund of knowledge:   Good  Insight:    Good  Judgment:   Good  Impulse Control:  Fair- decreased as things worsen with brain tumor   Risk Assessment: Danger to Self:  No Self-injurious Behavior: No Danger to Others: No Duty to Warn:no Physical Aggression / Violence:No  Access to Firearms a concern: No  Gang Involvement:No  Substance use: No- denied any substance use and addictive behaviors.    Virtual Visit via VIDEO: I connected with client by MyChart telemedicine/telehealth application. Client gave their informed consent, and verified privacy. I confirmed that I am speaking with the correct person using two identifiers. I discussed the limitations, risks, security and privacy concerns of performing psychotherapy and management service virtually and confirmed their location. I also discussed with the patient that there may be a patient responsible charge related to this service and to confirm with the front desk if their insurance covers teletherapy. I also discussed with the patient the availability of in person appointments. The client expressed understanding and agreed to proceed. I discussed the treatment planning with the client. The client was provided an opportunity to ask questions and all were answered. The client agreed with the plan and demonstrated an understanding of the  instructions. The client was advised to call our office if symptoms worsen or feel they are in a crisis state and need immediate contact. Client also reminded of a crisis line number and to use 9-1-1 if there's an emergency.  Therapist Location: office; Client Location: home.  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 Client attended medical appt to give urine drug screen of which he reported passing, negative for all substances/drugs he's not prescribed. Therapist used MI & SFT with client to encourage client's motivation to keep working on writing to help him stay healthy in recovery and to help client process other goals he has with his recovery. 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 Therapist used MI to validate client's strengths and skills, offering support and providing affirmations. Therapist assessed for stability and sobriety, and client denied all SI/HI/AVH and denied cravings /substance use. Therapist used 12 steps and RPT with client to assess where client is in his 12step process including how helping others as a peer support still fits into his relapse prevention txt.  Interventions: Motivational Interviewing, Solution-Oriented/Positive Psychology and RPT & 12 steps  Diagnosis:   ICD-10-CM   1. 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 Hypersomnia with sleep apnea  G47.10    G47.30    Plan of  Care: Client to return for weekly therapy with Sammuel Cooper, therapist, for outpatient therapy, to review again in 3-6 months. Client is to continue seing medication provider for support of mood management, triggers and cravings.   Client to follow through with  maintaining outside support for them in their SUD txt: engaging in 12 step meetings and maintaining use of MAT for opioid use disorder.  Client to engage in [place] txt and RPT relapse prevention therapy AEB coming to therapy weekly and implementing recovery oriented coping strategies to help reduce use of substances and to find relief from symptoms of MH disorders and trauma that cause them to want to escape from reality and numb their mind&body.  Client also to create more stability & structure AEB goal planning with therapist to assist in helping them get into a healthy rhythm/ schedule that can help to calm their nervous system and begin to build more healthy brain neuropathways.  Client to engage in CBT: challenging negative internal ruminationsand self-talk AEB expressing toxic thoughts and challenging them with truth.  Client to practice DBT distress tolerance skills (such as distress tolerance and emotion regulation) to practice achieving wise mind and to build support for dealing with cravings AEB learning to ride the wave of emotion/sensation/etc instead of seeking negative self-soothing techniques: ie using substances to numb self.  Client to utilize BSP (brainspotting) with therapist to help client identify and process triggers for their MH/ SUD with goal of reducing SUDs by 33% each session. Client to prioritize sleep 8+ hours each week night AEB going to bed by 10pm each night. Client participated in the treatment planning of their therapy. Client agreed with the plan and understands what to do if there is a crisis: call 9-1-1 and/or crisis line given by therapist.  Barnie Del, LCSW, LCAS, CCTP, CCS-I, BSP

## 2019-11-29 ENCOUNTER — Encounter: Payer: Self-pay | Admitting: Addiction (Substance Use Disorder)

## 2019-11-29 ENCOUNTER — Ambulatory Visit (INDEPENDENT_AMBULATORY_CARE_PROVIDER_SITE_OTHER): Payer: Medicare Other | Admitting: Addiction (Substance Use Disorder)

## 2019-11-29 DIAGNOSIS — G4710 Hypersomnia, unspecified: Secondary | ICD-10-CM | POA: Diagnosis not present

## 2019-11-29 DIAGNOSIS — F0634 Mood disorder due to known physiological condition with mixed features: Secondary | ICD-10-CM

## 2019-11-29 DIAGNOSIS — G4730 Sleep apnea, unspecified: Secondary | ICD-10-CM | POA: Diagnosis not present
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
## 2019-11-30 ENCOUNTER — Telehealth (INDEPENDENT_AMBULATORY_CARE_PROVIDER_SITE_OTHER): Payer: Medicare Other | Admitting: Medical

## 2019-11-30 ENCOUNTER — Encounter (HOSPITAL_COMMUNITY): Payer: Self-pay | Admitting: Medical

## 2019-11-30 DIAGNOSIS — G4710 Hypersomnia, unspecified: Secondary | ICD-10-CM

## 2019-11-30 DIAGNOSIS — R413 Other amnesia: Secondary | ICD-10-CM

## 2019-11-30 DIAGNOSIS — I6381 Other cerebral infarction due to occlusion or stenosis of small artery: Secondary | ICD-10-CM

## 2019-11-30 DIAGNOSIS — D330 Benign neoplasm of brain, supratentorial: Secondary | ICD-10-CM

## 2019-11-30 DIAGNOSIS — Z923 Personal history of irradiation: Secondary | ICD-10-CM

## 2019-11-30 DIAGNOSIS — F902 Attention-deficit hyperactivity disorder, combined type: Secondary | ICD-10-CM

## 2019-11-30 DIAGNOSIS — F1021 Alcohol dependence, in remission: Secondary | ICD-10-CM | POA: Diagnosis not present

## 2019-11-30 DIAGNOSIS — F1121 Opioid dependence, in remission: Secondary | ICD-10-CM | POA: Diagnosis not present

## 2019-11-30 DIAGNOSIS — F0634 Mood disorder due to known physiological condition with mixed features: Secondary | ICD-10-CM

## 2019-11-30 DIAGNOSIS — G4730 Sleep apnea, unspecified: Secondary | ICD-10-CM

## 2019-11-30 NOTE — Progress Notes (Signed)
Psychiatric Initial Adult Assessment    Patient Identification: Paul Bray MRN:  [id number] Date of Evaluation:  11/30/2019 Referral Source:  Virtual Visit via Video Note  I connected with Paul Bray on 11/30/19 at  3:00 PM EDT by MY CHART from [place] to patient's home computer and verified that I am speaking with the correct person using two identifiers.   I discussed the limitations of evaluation and management by telemedicine and the availability of in person appointments. The patient expressed understanding and agreed to proceed.   History of Present Illness:See EPIC note    Observations/Objective:See EPIC note   Assessment and Plan:See EPIC note   Follow Up Instructions:See EPIC note I discussed the assessment and treatment plan with the patient. The patient was provided an opportunity to ask questions and all were answered. The patient agreed with the plan and demonstrated an understanding of the instructions.   The patient was advised to call back or seek an in-person evaluation if the symptoms worsen or if the condition fails to improve as anticipated.  I provided 40 minutes of non-face-to-face time during this encounter.   [name], PA-C   Chief Complaint:   Visit Diagnosis:    ICD-10-CM   1. 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 S/P radiation therapy  Z92.3   4. Chronic alcoholism in remission ([place])  F10.21   5. Hypersomnia with sleep apnea  G47.10    G47.30   6. Memory dysfunction  R41.3   7. 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 In 2017 he consulted Neurology at [facility] with c/o deterirating memory,concentration and sleep issues.Repeat MRI showed slight increase in tumor and he was referred to Radiation Oncology/Dr Vallarie Mare where he subsequently underwent Gamma Knife Radiosurgery in April of 2018   With the loss of his ability to program in 2010 he developed complication of depression and substance abuse/chen miv cal dependency starting with alcohol and ending with an opiate/heroin addiction first treated at [facility] in August of 2018 and presently managed/in remission at [facility] with Subutex in [place] after an attempt at Methadone therapy with [facility]  During this period he was noticed to have delusions with paranoia as well/ ?etiology. He has been prescribed Provigil for his hypersomnia./Adderall and Risperdal thru [facility] as well.  FU MRI 2020 revealed slight 1 mm decrease in size  But new finding of Lt lacunar infarcts.  Associated Signs/Symptoms: Depression Symptoms:   hypersomnia, psychomotor retardation, fatigue, difficulty concentrating, impaired memory, anxiety, loss of energy/fatigue, trouble navigating (Hypo) Manic Symptoms:  na Anxiety Symptoms:   Excessive Worry, Psychotic Symptoms:   Paranoia, PTSD Symptoms: Negative  Past Psychiatric History:  NOVANT Alcohol intoxication 03/26/2017  Alcohol withdrawal delirium 03/26/2017  Chronic daily headache 02/25/2017  Alcohol use disorder, severe,  dependence 02/22/2017  Heroin dependence MAT Methadone [facility]/Subutex [facility] 01/2018  Anxiety disorder, unspecified 01/20/2017  Paranoia 03/2018   [facility] Unk Pinto, NP - 04/12/2017 12:38 PM EST Formatting  of this note might be different from the original. [facility] Psychiatry - Substance Abuse PHP/IOP Intake Assessment BH Formulate an individual and appropriate relapse prevention plan.  [place] SU Problem: Formation of a relapse prevention plan   [facility] Uncomplicated opioid dependence (CMS/HCC) 12/24/2017  1. Discharge from hospital. 2. Follow up at [facility].       Previous Psychotropic Medications: Yes   Substance Abuse History in the last 12 months:  No.  Consequences of Substance Abuse: Medical Consequences:  Detox/IP/OP trearments Family Consequences:  homeless/conflict Blackouts:  YES Withdrawal Symptoms:   Cramps Diaphoresis Diarrhea Headaches Nausea Tremors Vomiting anxiety/depression  Past Medical History:  Past Medical History:  Diagnosis Date  . ADHD   . Brain tumor ([place])    balance and occ memory issues and headaches  . Brain tumor ([place])    sees [facility] q year  . Headache    migraine and tension  . Hypothyroidism   . Soft tissue mass    left shoulder  . Substance abuse [facility])     Past Surgical History:  Procedure Laterality Date  . colonscopy    . gamma knife radiation 2018 for brain tumor'    . hematoma removed from arm Left   . MASS EXCISION Left 06/30/2019   Procedure: EXCISION OF SOFT TISSUE  MASS LEFT SHOULDER;  Surgeon: Armandina Gemma, MD;  Location: [facility];  Service: General;  Laterality: Left;  LMA    Family Psychiatric History:   Family History: No family history on file.  Family History [facility] Relation Name Status Comments  Brother  Alive   Brother  Alive   Father  Alive   Maternal Grandfather  Deceased   Maternal Grandmother  Deceased   Mother  Alive   Paternal Grandfather  Deceased   Paternal Grandmother  Deceased   Sister  Alive    Social History:  Social History        Socioeconomic History  . Marital status: Single    Spouse  name: NA  . Number of children: NA  . Years of education: 34  . Highest education level:   Occupational History  . Not on file  Tobacco Use  . Smoking status: Current Every Day Smoker    Types: Cigarettes  . Smokeless tobacco: Never Used  . Tobacco comment: quit Jan 2020  Vaping Use  . Vaping Use: Never used  Substance and Sexual Activity  . Alcohol use: Not Currently    Comment: quit oct 2019  . Drug use: Yes    Types: Cocaine,Heroin  IV    Comment: last used OCT  2019 per pt  w. Sexual activity: Not on file  Other Topics Concern  .   Social History Narrative  .    Social Determinants of Health      Financial Resource Strain:   . Difficulty of Paying Living Expenses: Disability  Food Insecurity:   . Worried About Charity fundraiser in the Last Year: On disability  . Ran Out of Food in the Last Year:   Transportation Needs:   . Film/video editor (Medical): Uses [employer]   . Lack of Transportation (Non-Medical):   Physical Activity:   . Days of Exercise per Week:   . Minutes  of Exercise per Session:   Stress:   . Feeling of Stress : Constant   Social Connections:   . Frequency of Communication with Friends and Family: Mother recently kicked him out of house  . Frequency of Social Gatherings with Friends and Family: Lives alone-  . Attends Religious Services: No  . Active Member of Clubs or Organizations: Not active with AA /NA  . Attends Archivist Meetings:   .     Additional Social History: Recenly asked to move out of mother's house  Allergies:  No Known Allergies  Metabolic Disorder Labs: No results found for: HGBA1C, MPG No results found for: PROLACTIN No results found for: CHOL, TRIG, HDL, CHOLHDL, VLDL, LDLCALC Lab Results  Component Value Date   TSH 2.166 01/09/2018    Therapeutic Level Labs:NA  Current Medications: Current Outpatient Medications  Medication Sig Dispense Refill  . acetaminophen (TYLENOL) 500 MG  tablet Take 2,000 mg by mouth 2 (two) times daily as needed for headache (pain).     Marland Kitchen amphetamine-dextroamphetamine (ADDERALL XR) 25 MG 24 hr capsule     . benztropine (COGENTIN) 1 MG tablet Take 1 mg by mouth 2 (two) times daily.    . buprenorphine (SUBUTEX) 8 MG SUBL SL tablet Place 8 mg under the tongue 3 (three) times daily.    . cephALEXin (KEFLEX) 500 MG capsule Take 500 mg by mouth 2 (two) times daily.    . clindamycin (CLEOCIN T) 1 % SWAB Apply 1 each topically 2 (two) times daily.    Marland Kitchen doxycycline (VIBRAMYCIN) 50 MG capsule     . gabapentin (NEURONTIN) 300 MG capsule Take 300 mg by mouth 2 (two) times daily as needed.    . hydrOXYzine (ATARAX/VISTARIL) 10 MG tablet Take 10 mg by mouth as needed.    Marland Kitchen levothyroxine (SYNTHROID, LEVOTHROID) 112 MCG tablet Take 112 mcg by mouth daily before breakfast.    . metroNIDAZOLE (METROGEL) 0.75 % gel Apply 1 application topically at bedtime.    . modafinil (PROVIGIL) 200 MG tablet Take 200 mg by mouth 2 (two) times daily.    . risperiDONE (RISPERDAL) 1 MG tablet Take 1 mg by mouth at bedtime.    . risperiDONE (RISPERDAL) 2 MG tablet Take 2 mg by mouth 2 (two) times daily.    . tazarotene (AVAGE) 0.1 % cream Apply 1 application topically daily.    Marland Kitchen testosterone cypionate (DEPOTESTOSTERONE CYPIONATE) 200 MG/ML injection Inject 200 mg into the muscle every 30 (thirty) days.    Marland Kitchen topiramate (TOPAMAX) 100 MG tablet Take 100 mg by mouth at bedtime.     No current facility-administered medications for this visit.   Musculoskeletal: Strength & Muscle Tone: Telepsych visit-Grossly normal Musculoskeletal and cranial nerve inspections Gait & Station: NA Patient leans: N/A  Psychiatric Specialty Exam: Review of Systems  Constitutional: Positive for activity change. Negative for appetite change, chills, diaphoresis, fatigue, fever and unexpected weight change.  HENT: Negative for congestion, dental problem, drooling, ear discharge, ear pain, facial  swelling, hearing loss, mouth sores, nosebleeds, postnasal drip, rhinorrhea, sinus pressure, sinus pain, sneezing, sore throat, tinnitus, trouble swallowing and voice change.   Eyes: Negative for photophobia, pain, discharge, redness, itching and visual disturbance.  Respiratory: Negative for apnea, cough, choking, shortness of breath, wheezing and stridor.   Cardiovascular: Negative for chest pain, palpitations and leg swelling.  Gastrointestinal: Negative for abdominal pain, anal bleeding, blood in stool, constipation, diarrhea, nausea, rectal pain and vomiting.  Endocrine: Negative for cold intolerance, heat  intolerance, polydipsia, polyphagia and polyuria.  Genitourinary: Negative for decreased urine volume, difficulty urinating, discharge, dysuria, enuresis, flank pain, frequency, genital sores, hematuria, penile pain, penile swelling, scrotal swelling, testicular pain and urgency.  Musculoskeletal: Negative for arthralgias, back pain, gait problem, joint swelling, myalgias, neck pain and neck stiffness.  Skin: Negative for color change, pallor, rash and wound.  Allergic/Immunologic: Negative for environmental allergies, food allergies and immunocompromised state.  Neurological: Positive for syncope and speech difficulty. Negative for dizziness, tremors, seizures, facial asymmetry, weakness, light-headedness and numbness.       Interseptal meningioma S/P gamma radiation MRI finding new  Lacunar infarcts 2020  Hematological: Negative for adenopathy. Does not bruise/bleed easily.  Psychiatric/Behavioral: Positive for agitation, confusion, decreased concentration, dysphoric mood and sleep disturbance. Negative for behavioral problems, hallucinations, self-injury and suicidal ideas. The patient is nervous/anxious. The patient is not hyperactive.        Addictions in remission MAT Suboxone    There were no vitals taken for this visit.There is no height or weight on file to calculate BMI. MY CHART  VISIT  Most Recent Value     BP: 113/81 as of 06/30/2019     Pulse Rate: 62 as of 06/30/2019     Body Mass Index: None     Height: 5\' 11"  (1.803 m) as of 06/30/2019     Weight: 189 lb 14.4 oz (86.1 kg) as of 06/30/2019     Resp: 15 as of 06/30/2019     SpO2: 100% as of 06/30/2019     Temp: 97.7 F (36.5 C) as of 06/30/2019        General Appearance: Casual and Well Groomed  Eye Contact:  Good  Speech:  Clear and Coherent and Normal Rate  Volume:  Normal  Mood:  variable  Affect:  Appropriate and Congruent  Thought Process:  Coherent, Goal Directed and Descriptions of Associations: Loose  Orientation:  Full (Time, Place, and Person)  Thought Content:  WDL and Logical  Suicidal Thoughts:  No  Homicidal Thoughts:  No  Memory:  remembers past and present-seems to have trouble with remembering processes like computer word meanings/associations  Judgement:  Impaired  Insight:  Fair  Psychomotor Activity:  Negative  Concentration:  Concentration: intact for visit and Attention Span: intact for visit  Recall:  depends on subject matter  Fund of Knowledge:wdl  Language: wdl  Akathisia:  NA telepsych visit  Handed:  Right  AIMS (if indicated)NA  Assets:  Desire for Improvement Financial Resources/Insurance Housing Resilience Social Support Talents/Skills  ADL's:  Impaired  Cognition: Impaired,  Moderate  Sleep:  impaired      Assessment; Complicated case starting in 2010 and progressing involving intraventricular meningioma S/P Gamma Knife 2018;development of chemical dependency now in remission and symptoms of paranoia. Of particular interest is the mention of "remote lacunar infarcts in Lt Basal Ganglia "on 2020 FU MRI.      and Plan: Get releases for copies of 2020 MRI and Radiation films for comparison. Check pituatry function.FU 1 week. Review CDs of 2010 MRIs     [name], PA-C 8/5/20215:19 PM

## 2019-12-06 ENCOUNTER — Ambulatory Visit: Payer: Medicare Other | Admitting: Addiction (Substance Use Disorder)

## 2019-12-07 ENCOUNTER — Telehealth (INDEPENDENT_AMBULATORY_CARE_PROVIDER_SITE_OTHER): Payer: Medicare Other | Admitting: Medical

## 2019-12-07 ENCOUNTER — Other Ambulatory Visit (HOSPITAL_COMMUNITY): Payer: Self-pay | Admitting: Medical

## 2019-12-07 ENCOUNTER — Telehealth (HOSPITAL_COMMUNITY): Payer: Self-pay | Admitting: Medical

## 2019-12-07 DIAGNOSIS — F1021 Alcohol dependence, in remission: Secondary | ICD-10-CM | POA: Diagnosis not present

## 2019-12-07 DIAGNOSIS — Z923 Personal history of irradiation: Secondary | ICD-10-CM

## 2019-12-07 DIAGNOSIS — R413 Other amnesia: Secondary | ICD-10-CM

## 2019-12-07 DIAGNOSIS — F1121 Opioid dependence, in remission: Secondary | ICD-10-CM | POA: Diagnosis not present

## 2019-12-07 DIAGNOSIS — G4710 Hypersomnia, unspecified: Secondary | ICD-10-CM | POA: Diagnosis not present

## 2019-12-07 DIAGNOSIS — F0634 Mood disorder due to known physiological condition with mixed features: Secondary | ICD-10-CM

## 2019-12-07 DIAGNOSIS — Z8659 Personal history of other mental and behavioral disorders: Secondary | ICD-10-CM

## 2019-12-07 DIAGNOSIS — D330 Benign neoplasm of brain, supratentorial: Secondary | ICD-10-CM

## 2019-12-07 DIAGNOSIS — I6381 Other cerebral infarction due to occlusion or stenosis of small artery: Secondary | ICD-10-CM

## 2019-12-07 MED ORDER — PROPRANOLOL HCL 10 MG PO TABS: 10.0000 mg | ORAL_TABLET | Freq: Three times a day (TID) | ORAL | 0 refills | Status: DC
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
## 2019-12-07 NOTE — Telephone Encounter (Signed)
Pt called requesting refill of Propranolol for flushing (anxiety) and BP conv cerns 140/86) Has been on same in past-review of meds confirms. Also forgot to schedule FU today at 4pm-Nurse will schedule MY CHART visit

## 2019-12-08 ENCOUNTER — Encounter (HOSPITAL_COMMUNITY): Payer: Self-pay | Admitting: Medical

## 2019-12-13 ENCOUNTER — Ambulatory Visit (INDEPENDENT_AMBULATORY_CARE_PROVIDER_SITE_OTHER): Payer: Medicare Other | Admitting: Addiction (Substance Use Disorder)

## 2019-12-13 DIAGNOSIS — F411 Generalized anxiety disorder: Secondary | ICD-10-CM | POA: Diagnosis not present

## 2019-12-13 DIAGNOSIS — F0634 Mood disorder due to known physiological condition with mixed features: Secondary | ICD-10-CM | POA: Diagnosis not present

## 2019-12-13 DIAGNOSIS — F428 Other obsessive-compulsive disorder: Secondary | ICD-10-CM | POA: Diagnosis not present

## 2019-12-14 ENCOUNTER — Encounter: Payer: Self-pay | Admitting: Addiction (Substance Use Disorder)
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
## 2019-12-21 ENCOUNTER — Ambulatory Visit (INDEPENDENT_AMBULATORY_CARE_PROVIDER_SITE_OTHER): Payer: Medicare Other | Admitting: Addiction (Substance Use Disorder)

## 2019-12-21 DIAGNOSIS — F0634 Mood disorder due to known physiological condition with mixed features: Secondary | ICD-10-CM | POA: Diagnosis not present

## 2019-12-26 ENCOUNTER — Encounter: Payer: Self-pay | Admitting: Addiction (Substance Use Disorder)
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
## 2019-12-28 ENCOUNTER — Ambulatory Visit (INDEPENDENT_AMBULATORY_CARE_PROVIDER_SITE_OTHER): Payer: Medicare Other | Admitting: Addiction (Substance Use Disorder)

## 2019-12-28 ENCOUNTER — Encounter: Payer: Self-pay | Admitting: Addiction (Substance Use Disorder)

## 2019-12-28 DIAGNOSIS — F0634 Mood disorder due to known physiological condition with mixed features: Secondary | ICD-10-CM | POA: Diagnosis not present

## 2019-12-28 DIAGNOSIS — F1121 Opioid dependence, in remission: Secondary | ICD-10-CM | POA: Diagnosis not present

## 2019-12-28 NOTE — Progress Notes (Signed)
[facility] Counselor/Therapist Progress Note  Patient ID: Paul Bray, MRN: [id number],    Date: 12/28/2019  Time Spent: 27mins  Treatment Type: Individual Therapy  Reported Symptoms:  Hopeful, guarded  Mental Status Exam:  Appearance:   Casual  Behavior:  Sharing and Blaming  Motor:  Normal  Speech/Language:   Clear and Coherent and Pressured  Affect:  Appropriate and Congruent  Mood:  normal  Thought process:  normal  Thought content:    Obsessions, Paranoid Ideation and Rumination  Sensory/Perceptual disturbances:    WNL  Orientation:  x4  Attention:  Fair  Concentration:  Fair  Memory:  impaired by brain tumor  Fund of knowledge:   Good  Insight:    Fair  Judgment:   Fair  Impulse Control:  Fair- decreased as things worsen with brain tumor   Risk Assessment: Danger to Self:  No Self-injurious Behavior: No Danger to Others: No Duty to Warn:no Physical Aggression / Violence:No  Access to Firearms a concern: No  Gang Involvement:No  Substance use: No- denied any substance use and addictive behaviors.    Virtual Visit via VIDEO: I connected with client by alternate telemedicine/telehealth application. Client gave their informed consent, and verified privacy. I confirmed that I am speaking with the correct person using two identifiers. I discussed the limitations, risks, security and privacy concerns of performing psychotherapy and management service virtually and confirmed their location. I also discussed with the patient that there may be a patient responsible charge related to this service and to confirm with the front desk if their insurance covers teletherapy. I also discussed with the patient the availability of in person appointments. The client expressed understanding and agreed to proceed. I discussed the treatment planning with the client. The client was provided an opportunity to ask questions and all were answered. The client agreed with the plan and  demonstrated an understanding of the instructions. The client was advised to call our office if symptoms worsen or feel they are in a crisis state and need immediate contact. Client also reminded of a crisis line number and to use 9-1-1 if there's an emergency.  Therapist Location: office; Client Location: home.  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 Client and therapist discussed client's goals for work and therapist used RPT & SFT with client to make SMART goals. Client also discussed his realization of needing distance from neighbors and not trusting people out of the gate. Client working on boundaries and remaining guarded without letting his brain think all the paranoid intrusive thoughts making him panic. Therapist used MI to encourage client to continue working on that and CBT to help client continue to challenge distorted thoughts. Therapist also nquired about client's cravings & stability and client denied SI/HI/AVH and all cravings. Client reported even more motivation to stay sober so he never does what they did to him. Therapist reminded client of the national crisis hotline number and also encouraged client to call 9-1-1 if he is in need.   Interventions: Cognitive Behavioral Therapy, Motivational Interviewing, Solution-Oriented/Positive Psychology and RPT   Diagnosis:   ICD-10-CM   1. 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 Severe opioid dependence in sustained remission on maintenance therapy ([place])  F11.21     Plan of Care: Client to return for weekly therapy with Sammuel Cooper, therapist, for outpatient  therapy, to review again in 3-6 months. Client is to continue seing medication provider for support of mood management, triggers and cravings.   Client to follow through with maintaining outside support for them in their SUD txt:  engaging in 12 step meetings and maintaining use of MAT for opioid use disorder.  Client to engage in [place] txt and RPT relapse prevention therapy AEB coming to therapy weekly and implementing recovery oriented coping strategies to help reduce use of substances and to find relief from symptoms of MH disorders and trauma that cause them to want to escape from reality and numb their mind&body.  Client also to create more stability & structure AEB goal planning with therapist to assist in helping them get into a healthy rhythm/ schedule that can help to calm their nervous system and begin to build more healthy brain neuropathways.  Client to engage in CBT: challenging negative internal ruminationsand self-talk AEB expressing toxic thoughts and challenging them with truth.  Client to practice DBT distress tolerance skills (such as distress tolerance and emotion regulation) to practice achieving wise mind and to build support for dealing with cravings AEB learning to ride the wave of emotion/sensation/etc instead of seeking negative self-soothing techniques: ie using substances to numb self.  Client to utilize BSP (brainspotting) with therapist to help client identify and process triggers for their MH/ SUD with goal of reducing SUDs by 33% each session. Client to prioritize sleep 8+ hours each week night AEB going to bed by 10pm each night. Client participated in the treatment planning of their therapy. Client agreed with the plan and understands what to do if there is a crisis: call 9-1-1 and/or crisis line given by therapist.  Barnie Del, LCSW, LCAS, CCTP, CCS-I, BSP

## 2020-01-04 ENCOUNTER — Telehealth (INDEPENDENT_AMBULATORY_CARE_PROVIDER_SITE_OTHER): Payer: Medicare Other | Admitting: Medical

## 2020-01-04 ENCOUNTER — Encounter: Payer: Self-pay | Admitting: Addiction (Substance Use Disorder)

## 2020-01-04 ENCOUNTER — Ambulatory Visit (INDEPENDENT_AMBULATORY_CARE_PROVIDER_SITE_OTHER): Payer: Medicare Other | Admitting: Addiction (Substance Use Disorder)

## 2020-01-04 ENCOUNTER — Other Ambulatory Visit: Payer: Self-pay

## 2020-01-04 DIAGNOSIS — I6381 Other cerebral infarction due to occlusion or stenosis of small artery: Secondary | ICD-10-CM

## 2020-01-04 DIAGNOSIS — G4710 Hypersomnia, unspecified: Secondary | ICD-10-CM

## 2020-01-04 DIAGNOSIS — R413 Other amnesia: Secondary | ICD-10-CM | POA: Diagnosis not present

## 2020-01-04 DIAGNOSIS — F1021 Alcohol dependence, in remission: Secondary | ICD-10-CM

## 2020-01-04 DIAGNOSIS — Z923 Personal history of irradiation: Secondary | ICD-10-CM

## 2020-01-04 DIAGNOSIS — F1121 Opioid dependence, in remission: Secondary | ICD-10-CM

## 2020-01-04 DIAGNOSIS — Z8659 Personal history of other mental and behavioral disorders: Secondary | ICD-10-CM

## 2020-01-04 DIAGNOSIS — F600 Paranoid personality disorder: Secondary | ICD-10-CM

## 2020-01-04 DIAGNOSIS — D330 Benign neoplasm of brain, supratentorial: Secondary | ICD-10-CM

## 2020-01-04 DIAGNOSIS — F0634 Mood disorder due to known physiological condition with mixed features: Secondary | ICD-10-CM | POA: Diagnosis not present

## 2020-01-04 NOTE — Progress Notes (Signed)
[facility] Counselor/Therapist Progress Note  Patient ID: Paul Bray, MRN: [id number],    Date: 01/04/2020  Time Spent: 21mins   Treatment Type: Individual Therapy  Reported Symptoms:  Thoughtful, motivated.  Mental Status Exam:  Appearance:   Casual and Well Groomed  Behavior:  Appropriate and Sharing  Motor:  Normal  Speech/Language:   Clear and Coherent and Normal Rate  Affect:  Appropriate and Congruent  Mood:  normal  Thought process:  normal  Thought content:    Obsessions and Rumination  Sensory/Perceptual disturbances:    WNL  Orientation:  x4  Attention:  Fair  Concentration:  Fair  Memory:  impaired by brain tumor  Fund of knowledge:   Good  Insight:    Fair  Judgment:   Fair  Impulse Control:  Fair- decreased as things worsen with brain tumor   Risk Assessment: Danger to Self:  No Self-injurious Behavior: No Danger to Others: No Duty to Warn:no Physical Aggression / Violence:No  Access to Firearms a concern: No  Gang Involvement:No  Substance use: No- denied any substance use and addictive behaviors.    Virtual Visit via VIDEO: I connected with client by alternate telemedicine/telehealth application. Client gave their informed consent, and verified privacy. I confirmed that I am speaking with the correct person using two identifiers. I discussed the limitations, risks, security and privacy concerns of performing psychotherapy and management service virtually and confirmed their location. I also discussed with the patient that there may be a patient responsible charge related to this service and to confirm with the front desk if their insurance covers teletherapy. I also discussed with the patient the availability of in person appointments. The client expressed understanding and agreed to proceed. I discussed the treatment planning with the client. The client was provided an opportunity to ask questions and all were answered. The client agreed with the  plan and demonstrated an understanding of the instructions. The client was advised to call our office if symptoms worsen or feel they are in a crisis state and need immediate contact. Client also reminded of a crisis line number and to use 9-1-1 if there's an emergency.  Therapist Location: office; Client Location: home.  Subjective: Client reported feeling thoughtful and pensive about his past history of using and the ways he made it to the recovery road. Client processed his motivation in recovery and reported all the peer support recovery blog articles he has been writing in his free time as a way to cope and reflect back on his progress in his recovery. Therapist used MI &Narrative therapy with client to validate his progress and provide affirmations for his growth while also working with client to complete processing more of his recovery story. Client described his desires/goals for his recovery and time left on this earth (with his brain tumor scaring him time is short)and therapist used RPT with client. Therapist assessed clients sobriety and stability, and client denied SI/HI/AVH and reported sobriety and engagement in the 12 steps.   Interventions: Motivational Interviewing and RPT & Narrative therapy   Diagnosis:   ICD-10-CM   1. 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 Severe opioid dependence in sustained remission on maintenance therapy ([place])  F11.21     Plan of Care: Client to return for weekly therapy with Sammuel Cooper, therapist, for outpatient therapy, to review again in 3-6 months. Client is to continue seing medication provider for support  of mood management, triggers and cravings.   Client to follow through with maintaining outside support for them in their SUD txt: engaging in 12 step meetings and maintaining use of MAT for opioid use disorder.  Client to engage in [place] txt and RPT relapse prevention therapy AEB coming to therapy  weekly and implementing recovery oriented coping strategies to help reduce use of substances and to find relief from symptoms of MH disorders and trauma that cause them to want to escape from reality and numb their mind&body.  Client also to create more stability & structure AEB goal planning with therapist to assist in helping them get into a healthy rhythm/ schedule that can help to calm their nervous system and begin to build more healthy brain neuropathways.  Client to engage in CBT: challenging negative internal ruminationsand self-talk AEB expressing toxic thoughts and challenging them with truth.  Client to practice DBT distress tolerance skills (such as distress tolerance and emotion regulation) to practice achieving wise mind and to build support for dealing with cravings AEB learning to ride the wave of emotion/sensation/etc instead of seeking negative self-soothing techniques: ie using substances to numb self.  Client to utilize BSP (brainspotting) with therapist to help client identify and process triggers for their MH/ SUD with goal of reducing SUDs by 33% each session. Client to prioritize sleep 8+ hours each week night AEB going to bed by 10pm each night. Client participated in the treatment planning of their therapy. Client agreed with the plan and understands what to do if there is a crisis: call 9-1-1 and/or crisis line given by therapist.  Barnie Del, LCSW, LCAS, CCTP, CCS-I, BSP

## 2020-01-06 ENCOUNTER — Encounter (HOSPITAL_COMMUNITY): Payer: Self-pay | Admitting: Medical

## 2020-01-06 NOTE — Progress Notes (Signed)
[place] MD/PA/NP OP Progress Note  01/04/2020 3:30pm Paul Bray  MRN:  [id number]   Virtual Visit via Video Note I connected with Paul Bray by [facility] CHART enabled telemedicine application from [facility] @ 3:30pm and verified that I am speaking with Paul Bray at his apartment using two identifiers.    I discussed the assessment and treatment plan with the patient. The patient was provided an opportunity to ask questions and all were answered. The patient agreed with the plan and demonstrated an understanding of the instructions.   History of Present Illness:See EPIC note     Observations/Objective:See EPIC note     Assessment and Plan:See EPIC note     Follow Up Instructions:See EPIC note   The patient was advised to call back or seek an in-person evaluation if the symptoms worsen or if the condition fails to improve as anticipated.   I provided   15 minutes of non-face-to-face time during this encounter.      Chief Complaint:  Chief Complaint    Follow-up     HPI: Pt seen at his request to monitor progress-requesting weekly FU at this point. He is advised we have received his MRI discs and that will ask Radiolgist to review-he is advised there may be a consultation charge and he agrees. He says he got security cameras and is feeling much better than at our last encounter.He is having no obsessions about neighbors/landlord etc per last encounter. Advised will meet virtually for now Advised him I am unable to se his Counselor notes even after breaking the glass.He will ask her to release these to me.  Visit Diagnosis:    ICD-10-CM   1. Severe opioid dependence in sustained remission on maintenance therapy (HCC)  F11.21   2. Chronic alcoholism in remission ([place])  F10.21   3. Persistent hypersomnia  G47.10   4. Memory dysfunction  R41.3   5. 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 S/P radiation therapy  Z92.3   7. Multiple lacunar  infarcts (HCC)  I63.81   8. History of ADHD  Z86.59   9. 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 ADHD   . Brain tumor ([place])    balance and occ memory issues and headaches  . Brain tumor ([place])    sees [facility] q year  . Headache    migraine and tension  . Hypothyroidism   . Soft tissue mass    left shoulder  . Substance abuse [facility])     Past Surgical History:  Procedure Laterality Date  . colonscopy    . gamma knife radiation 2018 for brain tumor'    . hematoma removed from arm Left   . MASS EXCISION Left 06/30/2019   Procedure: EXCISION OF SOFT TISSUE  MASS LEFT SHOULDER;  Surgeon: Armandina Gemma, MD;  Location: [facility];  Service: General;  Laterality: Left;  LMA    Family History:  Family [place] Relation Name Status Comments  Brother  Alive   Brother  Alive   Father  Alive   Maternal Grandfather  Deceased   Maternal Grandmother  Deceased   Mother  Alive   Paternal Grandfather  Deceased   Paternal Grandmother  Deceased   Sister  Alive     Social History: Social History        Socioeconomic History  . Marital status:  Single    Spouse name: NA  . Number of children: NA  . Years of education: 59  . Highest education level:   Occupational History  . Not on file  Tobacco Use  . Smoking status: Current Every Day Smoker    Types: Cigarettes  . Smokeless tobacco: Never Used  . Tobacco comment: quitJan 2020  Vaping Use  . Vaping Use: Never used  Substance and Sexual Activity  . Alcohol use: Not Currently    Comment: quit oct 2019  . Drug use: Yes    Types: Cocaine,HeroinIV    Comment: last usedOCT2019 per pt  w. Sexual activity: Not on file  Other Topics Concern  .   Social History Narrative  .    Social Determinants of Health      Financial Resource Strain:   . Difficulty of Paying Living  Expenses:Disability  Food Insecurity:   . Worried About Charity fundraiser in the Last Year:On disability  . Ran Out of Food in the Last Year:   Transportation Needs:   . Film/video editor (Medical):Uses [employer]  . Lack of Transportation (Non-Medical):   Physical Activity:   . Days of Exercise per Week:   . Minutes of Exercise per Session:   Stress:   . Feeling of Stress :Constant  Social Connections:   . Frequency of Communication with Friends and Family:Mother recently kicked him out of house  . Frequency of Social Gatherings with Friends and Family:Lives alone-  . Attends Religious Services:No  . Active Member of Clubs or Organizations:Not active with [place]  . Attends Archivist Meetings:   .       Allergies: No Known Allergies  Metabolic Disorder Labs: No results found for: HGBA1C, MPG No results found for: PROLACTIN No results found for: CHOL, TRIG, HDL, CHOLHDL, VLDL, LDLCALC Lab Results  Component Value Date   TSH 2.166 01/09/2018   TSH 1.459 08/03/2017    Therapeutic Level Labs:NA  Current Medications: Current Outpatient Medications  Medication Sig Dispense Refill  . acetaminophen (TYLENOL) 500 MG tablet Take 2,000 mg by mouth 2 (two) times daily as needed for headache (pain).     Marland Kitchen amphetamine-dextroamphetamine (ADDERALL XR) 25 MG 24 hr capsule     . benztropine (COGENTIN) 1 MG tablet Take 1 mg by mouth 2 (two) times daily.    . buprenorphine (SUBUTEX) 8 MG SUBL SL tablet Place 8 mg under the tongue 3 (three) times daily.    . cephALEXin (KEFLEX) 500 MG capsule Take 500 mg by mouth 2 (two) times daily.    . clindamycin (CLEOCIN T) 1 % SWAB Apply 1 each topically 2 (two) times daily.    Marland Kitchen doxycycline (VIBRAMYCIN) 50 MG capsule     . gabapentin (NEURONTIN) 300 MG capsule Take 300 mg by mouth 2 (two) times daily as needed.    . hydrOXYzine (ATARAX/VISTARIL) 10 MG tablet Take 10 mg by mouth as needed.    Marland Kitchen levothyroxine (SYNTHROID,  LEVOTHROID) 112 MCG tablet Take 112 mcg by mouth daily before breakfast.    . metroNIDAZOLE (METROGEL) 0.75 % gel Apply 1 application topically at bedtime.    . modafinil (PROVIGIL) 200 MG tablet Take 200 mg by mouth 2 (two) times daily.    . propranolol (INDERAL) 10 MG tablet Take 1 tablet (10 mg total) by mouth 3 (three) times daily. 90 tablet 0  . risperiDONE (RISPERDAL) 1 MG tablet Take 1 mg by mouth at bedtime.    Marland Kitchen  risperiDONE (RISPERDAL) 2 MG tablet Take 2 mg by mouth 2 (two) times daily.    . tazarotene (AVAGE) 0.1 % cream Apply 1 application topically daily.    Marland Kitchen testosterone cypionate (DEPOTESTOSTERONE CYPIONATE) 200 MG/ML injection Inject 200 mg into the muscle every 30 (thirty) days.    Marland Kitchen topiramate (TOPAMAX) 100 MG tablet Take 100 mg by mouth at bedtime.     No current facility-administered medications for this visit.     Musculoskeletal: Strength & Muscle Tone: Telepsych visit-Grossly normal Musculoskeletal and cranial nerve inspections Gait & Station: NA Patient leans: N/A Psychiatric Specialty Exam: Review of Systems  Constitutional: Positive for activity change (better mental status). Negative for appetite change, chills, diaphoresis, fatigue, fever and unexpected weight change.  Neurological: Positive for headaches. Negative for dizziness, tremors, seizures, syncope, facial asymmetry, speech difficulty, weakness, light-headedness and numbness.  Psychiatric/Behavioral: Positive for decreased concentration (on stimulants for hx ADHD0no formal testing/documentation) and sleep disturbance (chronic). Negative for agitation, behavioral problems, confusion, dysphoric mood, hallucinations, self-injury and suicidal ideas. The patient is not nervous/anxious and is not hyperactive.     There were no vitals taken for this visit.There is no height or weight on file to calculate BMI.MY CHART VISIT  Screenings:   Assessment and Plan:    [name], PA-C 01/06/2020, 10:16 PM

## 2020-01-11 ENCOUNTER — Telehealth (INDEPENDENT_AMBULATORY_CARE_PROVIDER_SITE_OTHER): Payer: Medicare Other | Admitting: Medical

## 2020-01-11 ENCOUNTER — Encounter (HOSPITAL_COMMUNITY): Payer: Self-pay | Admitting: Medical

## 2020-01-11 DIAGNOSIS — I6381 Other cerebral infarction due to occlusion or stenosis of small artery: Secondary | ICD-10-CM

## 2020-01-11 DIAGNOSIS — Z923 Personal history of irradiation: Secondary | ICD-10-CM

## 2020-01-11 DIAGNOSIS — D330 Benign neoplasm of brain, supratentorial: Secondary | ICD-10-CM

## 2020-01-11 DIAGNOSIS — R413 Other amnesia: Secondary | ICD-10-CM | POA: Diagnosis not present

## 2020-01-11 DIAGNOSIS — F1121 Opioid dependence, in remission: Secondary | ICD-10-CM | POA: Diagnosis not present

## 2020-01-11 DIAGNOSIS — F1021 Alcohol dependence, in remission: Secondary | ICD-10-CM | POA: Diagnosis not present

## 2020-01-11 DIAGNOSIS — G4710 Hypersomnia, unspecified: Secondary | ICD-10-CM

## 2020-01-11 DIAGNOSIS — F0634 Mood disorder due to known physiological condition with mixed features: Secondary | ICD-10-CM

## 2020-01-11 DIAGNOSIS — F411 Generalized anxiety disorder: Secondary | ICD-10-CM

## 2020-01-11 DIAGNOSIS — Z8659 Personal history of other mental and behavioral disorders: Secondary | ICD-10-CM

## 2020-01-11 DIAGNOSIS — F600 Paranoid personality disorder: Secondary | ICD-10-CM

## 2020-01-11 MED ORDER — PROPRANOLOL HCL 10 MG PO TABS: 10.0000 mg | ORAL_TABLET | Freq: Three times a day (TID) | ORAL | 2 refills | Status: DC

## 2020-01-11 NOTE — Progress Notes (Signed)
[place] MD/PA/NP OP Progress Note  01/11/2020 6:01 PM EULALIO REAMY  MRN:  [id number]   Virtual Visit via Video Note I connected with patient Benedetto Coons by [facility] CHART enabled telemedicine application from [facility] @ 3:30 pm at his apartment and verified that I am speaking with the correct patient using two identifiers.   I discussed the assessment and treatment plan with the patient. The patient was provided an opportunity to ask questions and all were answered. The patient agreed with the plan and demonstrated an understanding of the instructions.   History of Present Illness:See EPIC note     Observations/Objective:See EPIC note     Assessment and Plan:See EPIC note     Follow Up Instructions:See EPIC note   The patient was advised to call back or seek an in-person evaluation if the symptoms worsen or if the condition fails to improve as anticipated.   I provided 20  minutes of non-face-to-face time during this encounter.     Chief Complaint:  Chief Complaint    Follow-up; Addiction Problem; Head Injury; Anxiety; Depression; Hypersomnia; Agitation; Medication Refill     HPI: Pt returns for requested weekly visit to fu on his concerns regarding mental status especially memory loss and hypersomnia. He did not change his MAT program because his provider "had COVID". He c/o of " alot of drug use" at his current residence but remarks he has "no desire" to use which amazes him.  He is aware he must do something to substitute for patterns of using particularly isolation. He along this lins has gone to [employer] to begin working with them. 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 He continues to be plagued by hypersomnia saying he slept 24 hrs Monday and tuesday. 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 He has not had his blood drawn yet. When asked about his driving his [employer] He is  focused on his face /skin c/o red rash that has taken a year to get rid of yet he points to his lt cheek noting redness. He also reports using [employer] ans washing face multiple times.Cant drive MoPed due to face. Requests Proranolol refill  Visit Diagnosis:    ICD-10-CM   1. Severe opioid dependence in sustained remission on maintenance therapy (HCC)  F11.21   2. Chronic alcoholism in remission ([place])  F10.21   3. Persistent hypersomnia  G47.10   4. Memory dysfunction  R41.3   5. 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 S/P radiation therapy  Z92.3   7. Multiple lacunar infarcts (HCC)  I63.81   8. History of ADHD  Z86.59   9. Bipolar and related disorder due to another medical condition with mixed features  F06.34   10. 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 Generalized anxiety disorder  F41.1     Past Psychiatric History:  NOVANT Alcohol intoxication 03/26/2017  Alcohol withdrawal delirium 03/26/2017  Chronic daily headache 02/25/2017  Alcohol use disorder, severe, dependence 02/22/2017  Heroin dependence MAT Methadone Metro/Subutex TBR 01/2018  Anxiety disorder, unspecified 01/20/2017  Paranoia 03/2018   Shannon, Stella O, NP -04/12/2017 12:38 PM EST Formatting of this note might be different from the original. [facility] Psychiatry - Substance Abuse PHP/IOP Intake Assessment BH Formulate an individual and appropriate relapse prevention plan.  BH SU Problem: Formation of a relapse prevention plan  [place] Uncomplicated opioid dependence (CMS/HCC) 12/24/2017  1. Discharge from hospital. 2. Follow up at [facility].      Past Medical History:  Past Medical History:  Diagnosis Date  . ADHD   . Brain tumor ([place])    balance and occ memory issues and headaches  . Brain tumor ([place])    sees [facility] q year  . Headache    migraine and tension  .  Hypothyroidism   . Soft tissue mass    left shoulder  . Substance abuse [facility])     Past Surgical History:  Procedure Laterality Date  . colonscopy    . gamma knife radiation 2018 for brain tumor'    . hematoma removed from arm Left   . MASS EXCISION Left 06/30/2019   Procedure: EXCISION OF SOFT TISSUE  MASS LEFT SHOULDER;  Surgeon: Armandina Gemma, MD;  Location: [place];  Service: General;  Laterality: Left;  LMA    Family Psychiatric History: no record  Family History Family [place] Relation Name Status Comments  Brother  Alive   Brother  Alive   Father  Alive   Maternal Grandfather  Deceased   Maternal Grandmother  Deceased   Mother  Alive   Paternal Grandfather  Deceased   Paternal Grandmother  Deceased   Sister  Alive     Social History:       Socioeconomic History  . Marital status: Single    Spouse name: NA  . Number of children: NA  . Years of education: 80  . Highest education level:   Occupational History  . Not on file  Tobacco Use  . Smoking status: Current Every Day Smoker    Types: Cigarettes  . Smokeless tobacco: Never Used  . Tobacco comment: quitJan 2020  Vaping Use  . Vaping Use: Never used  Substance and Sexual Activity  . Alcohol use: Not Currently    Comment: quit oct 2019  . Drug use: Yes    Types: Cocaine,HeroinIV    Comment: last usedOCT2019 per pt  w. Sexual activity: Not on file  Other Topics Concern  .   Social History Narrative  .      Allergies: No Known Allergies  Metabolic Disorder Labs: No results found for: HGBA1C, MPG No results found for: PROLACTIN No results found for: CHOL, TRIG, HDL, CHOLHDL, VLDL, LDLCALC Lab Results  Component Value Date   TSH 2.166 01/09/2018   TSH 1.459 08/03/2017    Therapeutic Level Labs:  Current Medications: Current Outpatient Medications  Medication Sig Dispense Refill  . acetaminophen (TYLENOL) 500 MG tablet Take  2,000 mg by mouth 2 (two) times daily as needed for headache (pain).     Marland Kitchen amphetamine-dextroamphetamine (ADDERALL XR) 25 MG 24 hr capsule     . benztropine (COGENTIN) 1 MG tablet Take 1 mg by mouth 2 (two) times daily.    . buprenorphine (SUBUTEX) 8 MG SUBL SL tablet Place 8 mg under the tongue 3 (three) times daily.    . cephALEXin (KEFLEX) 500 MG capsule Take 500 mg by mouth 2 (two) times daily.    . clindamycin (CLEOCIN T) 1 % SWAB Apply 1 each topically 2 (two) times daily.    Marland Kitchen doxycycline (VIBRAMYCIN) 50 MG capsule     . gabapentin (NEURONTIN) 300 MG capsule Take 300 mg by mouth 2 (two) times daily as needed.    . hydrOXYzine (ATARAX/VISTARIL) 10 MG tablet Take 10 mg by mouth as  needed.    Marland Kitchen levothyroxine (SYNTHROID, LEVOTHROID) 112 MCG tablet Take 112 mcg by mouth daily before breakfast.    . metroNIDAZOLE (METROGEL) 0.75 % gel Apply 1 application topically at bedtime.    . modafinil (PROVIGIL) 200 MG tablet Take 200 mg by mouth 2 (two) times daily.    . propranolol (INDERAL) 10 MG tablet Take 1 tablet (10 mg total) by mouth 3 (three) times daily. 90 tablet 2  . risperiDONE (RISPERDAL) 1 MG tablet Take 1 mg by mouth at bedtime.    . risperiDONE (RISPERDAL) 2 MG tablet Take 2 mg by mouth 2 (two) times daily.    . tazarotene (AVAGE) 0.1 % cream Apply 1 application topically daily.    Marland Kitchen testosterone cypionate (DEPOTESTOSTERONE CYPIONATE) 200 MG/ML injection Inject 200 mg into the muscle every 30 (thirty) days.    Marland Kitchen topiramate (TOPAMAX) 100 MG tablet Take 100 mg by mouth at bedtime.     No current facility-administered medications for this visit.    Musculoskeletal: Strength & Muscle Tone: Telepsych visit-Grossly normal Musculoskeletal and cranial nerve inspections Gait & Station: NA Patient leans: N/A  Psychiatric Specialty Exam: Review of Systems  Constitutional: Positive for fatigue. Negative for activity change, appetite change, chills, diaphoresis, fever and unexpected weight  change.  Skin: Positive for color change (c/o redness/hypersensitivity) and rash. Negative for pallor and wound.       Facial redness-? Somatic irritation  Neurological: Negative for dizziness, tremors, seizures, syncope, facial asymmetry, speech difficulty, weakness, light-headedness, numbness and headaches.       C/o "brain fog"-memory loss-hypersomnia  Psychiatric/Behavioral: Positive for agitation (low level today), decreased concentration, dysphoric mood and sleep disturbance (hypersomnia). Negative for behavioral problems (remains abstinent and in MAT), confusion, hallucinations, self-injury and suicidal ideas. The patient is nervous/anxious. The patient is not hyperactive.        Opioid dependence MAT Subutex     There were no vitals taken for this visit.There is no height or weight on file to calculate BMI.MY CHART VISIT  General Appearance: Fairly Groomed  Eye Contact:  Good  Speech:  Clear and Coherent and Normal Rate  Volume:  Normal  Mood:  Variable  Affect:  Full Range  Thought Process:  Coherent and Descriptions of Associations: Tangential  Orientation:  Full (Time, Place, and Person)  Thought Content: WDL and Rumination   Suicidal Thoughts:  No  Homicidal Thoughts:  No  Memory:  Negative Impaired  Judgement:  Fair  Insight:  Lacking  Psychomotor Activity:  My chart limits  Concentration:  Concentration: Good and Attention Span: Good  Recall:  see memory  Fund of Knowledge: WDL  Language: WDL  Akathisia:  My chart limits view  Handed:  Right  AIMS (if indicated):MY chart visit  Assets:  Desire for Improvement Financial Resources/Insurance Housing Resilience Social Support Transportation Vocational/Educational  ADL's:  Impaired  Cognition: Impaired,  Moderate  Sleep:  Poor    Assessment ;Better mood wise.Needs to get blood work done and MRIs reviewed    Plan: Refill Propranolol-Given 1 wk FU and FAX # for labs.Radiology consult pending.Probable Neuropsych  eval after data in?  [name], PA-C 01/11/2020, 6:01 PM

## 2020-01-18 ENCOUNTER — Encounter (HOSPITAL_COMMUNITY): Payer: Self-pay | Admitting: Medical

## 2020-01-18 ENCOUNTER — Encounter (HOSPITAL_COMMUNITY): Payer: Self-pay

## 2020-01-18 ENCOUNTER — Telehealth (INDEPENDENT_AMBULATORY_CARE_PROVIDER_SITE_OTHER): Payer: Medicare Other | Admitting: Medical

## 2020-01-18 ENCOUNTER — Ambulatory Visit: Payer: Medicare Other | Admitting: Addiction (Substance Use Disorder)

## 2020-01-18 DIAGNOSIS — Z923 Personal history of irradiation: Secondary | ICD-10-CM

## 2020-01-18 DIAGNOSIS — F600 Paranoid personality disorder: Secondary | ICD-10-CM

## 2020-01-18 DIAGNOSIS — F428 Other obsessive-compulsive disorder: Secondary | ICD-10-CM

## 2020-01-18 DIAGNOSIS — F411 Generalized anxiety disorder: Secondary | ICD-10-CM

## 2020-01-18 DIAGNOSIS — R413 Other amnesia: Secondary | ICD-10-CM

## 2020-01-18 DIAGNOSIS — I6381 Other cerebral infarction due to occlusion or stenosis of small artery: Secondary | ICD-10-CM

## 2020-01-18 DIAGNOSIS — G4710 Hypersomnia, unspecified: Secondary | ICD-10-CM

## 2020-01-18 DIAGNOSIS — F1121 Opioid dependence, in remission: Secondary | ICD-10-CM

## 2020-01-18 DIAGNOSIS — Z8659 Personal history of other mental and behavioral disorders: Secondary | ICD-10-CM

## 2020-01-18 DIAGNOSIS — G4730 Sleep apnea, unspecified: Secondary | ICD-10-CM

## 2020-01-18 DIAGNOSIS — F1021 Alcohol dependence, in remission: Secondary | ICD-10-CM

## 2020-01-18 DIAGNOSIS — D330 Benign neoplasm of brain, supratentorial: Secondary | ICD-10-CM

## 2020-01-18 DIAGNOSIS — F0634 Mood disorder due to known physiological condition with mixed features: Secondary | ICD-10-CM

## 2020-01-18 NOTE — Progress Notes (Signed)
Patient ID: Paul Bray, male   DOB: 06-Aug-1965, 54 y.o.   MRN: [id number]   Pt failed to connect to [facility] CHART After 30 minutes Appt canceled. Will wait for him to reschedule

## 2020-01-22 ENCOUNTER — Ambulatory Visit: Payer: Medicare Other | Admitting: Addiction (Substance Use Disorder)

## 2020-01-25 ENCOUNTER — Encounter (HOSPITAL_COMMUNITY): Payer: Self-pay | Admitting: Medical

## 2020-01-25 ENCOUNTER — Telehealth (INDEPENDENT_AMBULATORY_CARE_PROVIDER_SITE_OTHER): Payer: Medicare Other | Admitting: Medical

## 2020-01-25 DIAGNOSIS — R413 Other amnesia: Secondary | ICD-10-CM | POA: Diagnosis not present

## 2020-01-25 DIAGNOSIS — F411 Generalized anxiety disorder: Secondary | ICD-10-CM

## 2020-01-25 DIAGNOSIS — G4710 Hypersomnia, unspecified: Secondary | ICD-10-CM

## 2020-01-25 DIAGNOSIS — Z8659 Personal history of other mental and behavioral disorders: Secondary | ICD-10-CM

## 2020-01-25 DIAGNOSIS — F1121 Opioid dependence, in remission: Secondary | ICD-10-CM | POA: Diagnosis not present

## 2020-01-25 DIAGNOSIS — I6381 Other cerebral infarction due to occlusion or stenosis of small artery: Secondary | ICD-10-CM

## 2020-01-25 DIAGNOSIS — Z923 Personal history of irradiation: Secondary | ICD-10-CM

## 2020-01-25 DIAGNOSIS — F0634 Mood disorder due to known physiological condition with mixed features: Secondary | ICD-10-CM

## 2020-01-25 DIAGNOSIS — F1021 Alcohol dependence, in remission: Secondary | ICD-10-CM | POA: Diagnosis not present

## 2020-01-25 DIAGNOSIS — F600 Paranoid personality disorder: Secondary | ICD-10-CM

## 2020-01-25 DIAGNOSIS — D330 Benign neoplasm of brain, supratentorial: Secondary | ICD-10-CM

## 2020-01-25 NOTE — Progress Notes (Addendum)
[place] MD/PA/NP OP Progress Note 01/25/2020, 3:26 PM Paul Bray  MRN:  [id number]   Virtual Visit via Video Note I connected with Paul Bray by [facility] CHART enabled telemedicine application from [place] @3 :00 pm to patient apartment and verified that I am speaking with the correct patient at his apartment using two identifiers.   I discussed the assessment and treatment plan with the patient. The patient was provided an opportunity to ask questions and all were answered. The patient agreed with the plan and demonstrated an understanding of the instructions.   The patient was advised to call back or seek an in-person evaluation if the symptoms worsen or if the condition fails to improve as anticipated.   I provided  25  minutes of non-face-to-face time during this encounter.   History of Present Illness:See EPIC note    Observations/Objective:See EPIC note   Assessment and Plan:See EPIC note   Follow Up Instructions:See EPIC note  Chief Complaint:   Follow-up; Addiction Problem; Head Injury; Anxiety; Depression; Hypersomnia  HPI: Pt seenat his request for ongoing concerns about Mental Status;living situation;memory loss;hypersomnia.Says he needs a counselor as no longer has one.   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 [facility] Psychiatry - Substance Abuse PHP/IOP Intake Assessment BH Formulate an individual and appropriate relapse prevention plan.  [place] SU Problem: Formation of a relapse prevention plan   [facility] Uncomplicated opioid dependence (CMS/HCC) 12/24/2017  1. Discharge from hospital. 2. [facility] at [facility].      Past Medical History:      Past Medical History:  Diagnosis Date  . ADHD   . Brain tumor ([place])    balance and occ memory issues and headaches  . Brain tumor ([place])    [facility] q year  . Headache    migraine and tension  . Hypothyroidism   . Soft tissue mass    left shoulder  . Substance abuse [facility])      Past Medical History:  Past Medical History:  Diagnosis Date  . ADHD   . Brain tumor ([place])    balance and occ memory issues and headaches  . Brain tumor ([place])    sees [facility] q year  . Headache    migraine and tension  . Hypothyroidism   . Soft tissue mass    left shoulder  . Substance abuse [facility])     Past Surgical History:  Procedure Laterality Date  . colonscopy    . gamma knife radiation 2018 for brain tumor'    . hematoma removed from arm Left   . MASS EXCISION Left 06/30/2019   Procedure: EXCISION OF SOFT TISSUE  MASS LEFT SHOULDER;  Surgeon: Armandina Gemma, MD;  Location: [place];  Service: General;  Laterality: Left;  LMA    Family Psychiatric History:   Family History: History reviewed. No pertinent family history.  Social History:  Social History   Socioeconomic History  . Marital status: Single    Spouse name: Not on file  . Number of children: Not on file  . Years of education: Not on file  . Highest education level: Not  on file  Occupational History  . Not on file  Tobacco Use  . Smoking status: Current Every Day Smoker    Types: Cigarettes  . Smokeless tobacco: Never Used  . Tobacco comment: quit jan 2020  Vaping Use  . Vaping Use: Never used  Substance and Sexual Activity  . Alcohol use: Not Currently    Comment: quit oct 2019  . Drug use: Yes    Types: Cocaine, IV    Comment: last used cocaine 2019 per pt  . Sexual activity: Not on file  Other Topics Concern  . Not on file  Social History Narrative  . Not on file   Social Determinants of Health   Financial Resource Strain:   . Difficulty of Paying Living Expenses: Not on file  Food Insecurity:   . Worried About Charity fundraiser in the Last Year: Not on file  . Ran Out of Food in the Last Year: Not on file  Transportation Needs:   . Lack of Transportation (Medical): Not on file  . Lack of Transportation (Non-Medical): Not on file  Physical Activity:   . Days of Exercise per Week: Not on file  . Minutes of Exercise per Session: Not on file  Stress:   . Feeling of Stress : Not on file  Social Connections:   . Frequency of Communication with Friends and Family: Not on file  . Frequency of Social Gatherings with Friends and Family: Not on file  . Attends Religious Services: Not on file  . Active Member of Clubs or Organizations: Not on file  . Attends Archivist Meetings: Not on file  . Marital Status: Not on file    Allergies: No Known Allergies  Metabolic Disorder Labs: No results found for: HGBA1C, MPG No results found for: PROLACTIN No results found for: CHOL, TRIG, HDL, CHOLHDL, VLDL, LDLCALC Lab Results  Component Value Date   TSH 2.166 01/09/2018   TSH 1.459 08/03/2017    Therapeutic Level Labs:NA  Current Medications: Current Outpatient Medications  Medication Sig Dispense Refill  . acetaminophen (TYLENOL) 500 MG tablet Take 2,000 mg by mouth 2 (two) times daily as needed for headache (pain).     Marland Kitchen  amphetamine-dextroamphetamine (ADDERALL XR) 25 MG 24 hr capsule     . benztropine (COGENTIN) 1 MG tablet Take 1 mg by mouth 2 (two) times daily.    . buprenorphine (SUBUTEX) 8 MG SUBL SL tablet Place 8 mg under the tongue 3 (three) times daily.    . cephALEXin (KEFLEX) 500 MG capsule Take 500 mg by mouth 2 (two) times daily.    . clindamycin (CLEOCIN T) 1 % SWAB Apply 1 each topically 2 (two) times daily.    Marland Kitchen doxycycline (VIBRAMYCIN) 50 MG capsule     . gabapentin (NEURONTIN) 300 MG capsule Take 300 mg by mouth 2 (two) times daily as needed.    . hydrOXYzine (ATARAX/VISTARIL) 10 MG tablet Take 10 mg by mouth as needed.    Marland Kitchen levothyroxine (SYNTHROID, LEVOTHROID) 112 MCG tablet Take 112 mcg by mouth daily before breakfast.    . metroNIDAZOLE (  METROGEL) 0.75 % gel Apply 1 application topically at bedtime.    . modafinil (PROVIGIL) 200 MG tablet Take 200 mg by mouth 2 (two) times daily.    . propranolol (INDERAL) 10 MG tablet Take 1 tablet (10 mg total) by mouth 3 (three) times daily. 90 tablet 2  . risperiDONE (RISPERDAL) 1 MG tablet Take 1 mg by mouth at bedtime.    . risperiDONE (RISPERDAL) 2 MG tablet Take 2 mg by mouth 2 (two) times daily.    . tazarotene (AVAGE) 0.1 % cream Apply 1 application topically daily.    Marland Kitchen testosterone cypionate (DEPOTESTOSTERONE CYPIONATE) 200 MG/ML injection Inject 200 mg into the muscle every 30 (thirty) days.    Marland Kitchen topiramate (TOPAMAX) 100 MG tablet Take 100 mg by mouth at bedtime.     No current facility-administered medications for this visit.      Musculoskeletal: Strength & Muscle Tone: Telepsych visit-Grossly normal Musculoskeletal and cranial nerve inspections Gait & Station: NA Patient leans: N/A   Psychiatric Specialty Exam: Review of Systems  Constitutional: Positive for activity change and fatigue. Negative for appetite change, chills, diaphoresis, fever and unexpected weight change.  Endocrine: Negative for cold intolerance, heat intolerance,  polydipsia, polyphagia and polyuria.  Musculoskeletal: Negative for arthralgias, back pain, gait problem, joint swelling, myalgias, neck pain and neck stiffness.  Neurological: Negative for dizziness, tremors, seizures, syncope, facial asymmetry, speech difficulty, weakness, light-headedness, numbness and headaches.  Psychiatric/Behavioral: Positive for agitation, confusion, dysphoric mood and sleep disturbance. Negative for behavioral problems, hallucinations (?), self-injury and suicidal ideas. The patient is nervous/anxious. The patient is not hyperactive.    There were no vitals taken for this visit.There is no height or weight on file to calculate BMI.MY CHART VISIT  General Appearance:  Casual Well groomed  Eye Contact:  Good  Speech:  Clear and Coherent and Normal Rate  Volume:  Normal  Mood:  Variable  Affect:  Full Range  Thought Process:  Coherent and Descriptions of Associations: Tangential  Orientation:  Full (Time, Place, and Person)  Thought Content: WDL and Rumination Continues  Suicidal Thoughts:  No  Homicidal Thoughts:  No  Memory:  Negative Impaired  Judgement:  Fair  Insight:  Lacking  Psychomotor Activity:  My chart limits  Concentration:  Concentration: Good and Attention Span: Good  Recall:  see memory  Fund of Knowledge: WDL  Language: WDL  Akathisia:  My chart limits view  Handed:  Right  AIMS (if indicated):MY chart visit  Assets:  Desire for Improvement Financial Resources/Insurance Housing Resilience Social Support Transportation Vocational/Educational  ADL's:  Impaired  Cognition: Impaired,  Moderate  Sleep:  Poor      Assessment: Continues to ruminate.Fearful His addictions appear to be in remission Will try to get him into [facility] thru [facility]      and Plan:  Will [facility] to get him into [facility] thru [facility].FU 1 week He hopefully will get labs done .still need radiology consultation   [name], PA-C 01/25/2020, 3:26 PM

## 2020-02-01 ENCOUNTER — Ambulatory Visit: Payer: Medicare Other | Admitting: Addiction (Substance Use Disorder)

## 2020-02-01 ENCOUNTER — Other Ambulatory Visit (HOSPITAL_COMMUNITY): Payer: Self-pay | Admitting: Medical

## 2020-02-01 ENCOUNTER — Telehealth (INDEPENDENT_AMBULATORY_CARE_PROVIDER_SITE_OTHER): Payer: Medicare Other | Admitting: Medical

## 2020-02-01 ENCOUNTER — Encounter (HOSPITAL_COMMUNITY): Payer: Self-pay | Admitting: Medical

## 2020-02-01 DIAGNOSIS — D330 Benign neoplasm of brain, supratentorial: Secondary | ICD-10-CM

## 2020-02-01 DIAGNOSIS — F600 Paranoid personality disorder: Secondary | ICD-10-CM | POA: Diagnosis not present

## 2020-02-01 DIAGNOSIS — F1021 Alcohol dependence, in remission: Secondary | ICD-10-CM

## 2020-02-01 DIAGNOSIS — G4710 Hypersomnia, unspecified: Secondary | ICD-10-CM | POA: Diagnosis not present

## 2020-02-01 DIAGNOSIS — I6381 Other cerebral infarction due to occlusion or stenosis of small artery: Secondary | ICD-10-CM

## 2020-02-01 DIAGNOSIS — F1121 Opioid dependence, in remission: Secondary | ICD-10-CM | POA: Diagnosis not present

## 2020-02-01 DIAGNOSIS — R413 Other amnesia: Secondary | ICD-10-CM

## 2020-02-01 DIAGNOSIS — Z923 Personal history of irradiation: Secondary | ICD-10-CM

## 2020-02-01 DIAGNOSIS — Z8659 Personal history of other mental and behavioral disorders: Secondary | ICD-10-CM

## 2020-02-01 NOTE — Progress Notes (Addendum)
BH MD/PA/NP OP Progress Note  02/01/2020 6:52 PM FARZAD TIBBETTS  MRN:  [id number]  Virtual Visit via Video Note I connected withTimothy Enzo Bi by [facility] CHART enabled telemedicine application from [employer] @5 :00 pm and verified that I am speaking with the correct patient at his apartment using two identifiers.  I discussed the assessment and treatment plan with the patient. The patient was provided an opportunity to ask questions and all were answered. The patient agreed with the plan and demonstrated an understanding of the instructions.   The patient was advised to call back or seek an in-person evaluation if the symptoms worsen or if the condition fails to improve as anticipated.  I provided  20  minutes of non-face-to-face time during this encounter.   History of Present Illness:See EPIC note   Observations/Objective:See EPIC note   Assessment and Plan:See EPIC note   Follow Up Instructions:See EPIC note Chief Complaint:  Chief Complaint    Follow-up; Addiction Problem; Agitation; Anxiety; Family Problem; Memory Loss; Hypersomnia     HPI: FU for ongoing complaints per CC.Did not get [place] access as he does not have Medicaid-only Medicare.Apology extended.He got Counselor at [facility] clinic.Continues to voice suspicions about neighbors-one trying to hack his WiFI?  Visit Diagnosis:    ICD-10-CM   1. 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 Opioid dependence in remission ([place])  F11.21   3. S/P radiation therapy  Z92.3 Hormone Panel  4. Chronic alcoholism in remission ([place])  F10.21   5. Persistent hypersomnia  G47.10   6. Multiple lacunar infarcts ([facility])  I63.81   7. 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 History of ADHD  Z86.59   9. Memory dysfunction  R41.3     Past Psychiatric History: Past Psychiatric History:  NOVANT Alcohol intoxication 03/26/2017  Alcohol withdrawal delirium 03/26/2017  Chronic  daily headache 02/25/2017  Alcohol use disorder, severe, dependence 02/22/2017  Heroin dependence MAT Methadone Metro/Subutex TBR 01/2018  Anxiety disorder, unspecified 01/20/2017  Paranoia 03/2018   [place], Stella O, NP -04/12/2017 12:38 PM EST Formatting of this note might be different from the original. [facility] Psychiat[facility] - Substance Abuse PHP/IOP Intake Assessment BH Formulate an individual and appropriate relapse prevention plan.  [facility] SU Problem: Formation of a relapse prevention plan   [facility] Uncomplicated opioid dependence (CMS/HCC) 12/24/2017  1. Discharge from hospital. 2. Follow up at [facility].      Past Medical History:  Past Medical History:  Diagnosis Date  . ADHD   . Brain tumor ([place])    balance and occ memory issues and headaches  . Brain tumor ([place])    sees [facility] q year  . Headache    migraine and tension  . Hypothyroidism   . Soft tissue mass    left shoulder  . Substance abuse [facility])     Past Surgical History:  Procedure Laterality Date  . colonscopy    . gamma knife radiation 2018 for brain tumor'    . hematoma removed from arm Left   . MASS EXCISION Left 06/30/2019   Procedure: EXCISION OF SOFT TISSUE  MASS LEFT SHOULDER;  Surgeon: Armandina Gemma, MD;  Location: [facility];  Service: General;  Laterality: Left;  LMA    Social History:  Social History   Socioeconomic History  . Marital status: Single    Spouse name: Not on file  .  Number of children: Not on file  . Years of education: Not on file  . Highest education level: Not on file  Occupational History  . Not on file  Tobacco Use  . Smoking status: Current Every Day Smoker    Types: Cigarettes  . Smokeless tobacco: Never Used  . Tobacco comment: quit jan 2020  Vaping Use  . Vaping Use: Never used  Substance and Sexual Activity  . Alcohol use: Not Currently    Comment: quit oct 2019   . Drug use: Yes    Types: Cocaine, IV    Comment: last used cocaine 2019 per pt  . Sexual activity: Not on file  Other Topics Concern  . Not on file  Social History Narrative  . Not on file   Social Determinants of Health   Financial Resource Strain:   . Difficulty of Paying Living Expenses: Not on file  Food Insecurity:   . Worried About Charity fundraiser in the Last Year: Not on file  . Ran Out of Food in the Last Year: Not on file  Transportation Needs:   . Lack of Transportation (Medical): Not on file  . Lack of Transportation (Non-Medical): Not on file  Physical Activity:   . Days of Exercise per Week: Not on file  . Minutes of Exercise per Session: Not on file  Stress:   . Feeling of Stress : Not on file  Social Connections:   . Frequency of Communication with Friends and Family: Not on file  . Frequency of Social Gatherings with Friends and Family: Not on file  . Attends Religious Services: Not on file  . Active Member of Clubs or Organizations: Not on file  . Attends Archivist Meetings: Not on file  . Marital Status: Not on file    Allergies: No Known Allergies  Metabolic Disorder Labs: No results found for: HGBA1C, MPG No results found for: PROLACTIN No results found for: CHOL, TRIG, HDL, CHOLHDL, VLDL, LDLCALC Lab Results  Component Value Date   TSH 2.166 01/09/2018   TSH 1.459 08/03/2017    Therapeutic Level Labs: No results found for: LITHIUM No results found for: VALPROATE No components found for:  CBMZ  Current Medications: Current Outpatient Medications  Medication Sig Dispense Refill  . acetaminophen (TYLENOL) 500 MG tablet Take 2,000 mg by mouth 2 (two) times daily as needed for headache (pain).     Marland Kitchen amphetamine-dextroamphetamine (ADDERALL XR) 25 MG 24 hr capsule     . benztropine (COGENTIN) 1 MG tablet Take 1 mg by mouth 2 (two) times daily.    . buprenorphine (SUBUTEX) 8 MG SUBL SL tablet Place 8 mg under the tongue 3 (three)  times daily.    . cephALEXin (KEFLEX) 500 MG capsule Take 500 mg by mouth 2 (two) times daily.    . clindamycin (CLEOCIN T) 1 % SWAB Apply 1 each topically 2 (two) times daily.    Marland Kitchen doxycycline (VIBRAMYCIN) 50 MG capsule     . gabapentin (NEURONTIN) 300 MG capsule Take 300 mg by mouth 2 (two) times daily as needed.    . hydrOXYzine (ATARAX/VISTARIL) 10 MG tablet Take 10 mg by mouth as needed.    Marland Kitchen levothyroxine (SYNTHROID, LEVOTHROID) 112 MCG tablet Take 112 mcg by mouth daily before breakfast.    . metroNIDAZOLE (METROGEL) 0.75 % gel Apply 1 application topically at bedtime.    . modafinil (PROVIGIL) 200 MG tablet Take 200 mg by mouth 2 (two) times daily.    Marland Kitchen  propranolol (INDERAL) 10 MG tablet Take 1 tablet (10 mg total) by mouth 3 (three) times daily. 90 tablet 2  . risperiDONE (RISPERDAL) 1 MG tablet Take 1 mg by mouth at bedtime.    . risperiDONE (RISPERDAL) 2 MG tablet Take 2 mg by mouth 2 (two) times daily.    . tazarotene (AVAGE) 0.1 % cream Apply 1 application topically daily.    Marland Kitchen testosterone cypionate (DEPOTESTOSTERONE CYPIONATE) 200 MG/ML injection Inject 200 mg into the muscle every 30 (thirty) days.    Marland Kitchen topiramate (TOPAMAX) 100 MG tablet Take 100 mg by mouth at bedtime.     No current facility-administered medications for this visit.     Musculoskeletal:  Psychiatric Specialty Exam: Review of Systems  Constitutional: Positive for activity change and appetite change. Negative for chills, diaphoresis, fever and unexpected weight change.  Endocrine: Negative for cold intolerance, heat intolerance, polydipsia and polyphagia.  Musculoskeletal: Negative for arthralgias, back pain, gait problem, joint swelling, myalgias, neck pain and neck stiffness.  Neurological: Positive for headaches. Negative for dizziness, tremors, seizures, syncope, facial asymmetry, speech difficulty, weakness, light-headedness and numbness.  Psychiatric/Behavioral: Positive for agitation, decreased  concentration, dysphoric mood and sleep disturbance. Negative for behavioral problems, confusion, hallucinations (?) and self-injury. The patient is nervous/anxious. The patient is not hyperactive.     There were no vitals taken for this visit.There is no height or weight on file to calculate BMI.MYCHART VISIT   General Appearance: Casual and Fairly Groomed  Eye Contact:  Good  Speech:  Clear and Coherent   Volume:Normal  Mood:Anxious  Affect:Full Range  Thought Process:Coherent and Descriptions of Associations:Loose  Orientation:Full (Time, Place, and Person)  Thought Content:WDL and Obsessions  Suicidal Thoughts:No  Homicidal Thoughts:No  Memory:Traumatic Impaired  Judgement:Fair  Insight:Lacking  Psychomotor Activity:My chart limits  Concentration:Concentration:[place] Attention Span:Fair  [place]  Akathisia:No-limited view  Handed:Right  AIMS (if indicated):MY chart visit  Assets:Desire for Improvement Financial Resources/Insurance [place]  Sleep:Poor    Assessment : Needs labs/Radiology review/Neuropsych assessment/Sleep assessment?  and Plan: Will contact Nurse and get labs done. Will get Radiology Consult   [name], PA-C 02/01/2020, 6:52 PM

## 2020-02-03 NOTE — Addendum Note (Signed)
Addended by: Dara Hoyer on: 02/03/2020 05:23 PM   Modules accepted: Level of Service

## 2020-02-05 ENCOUNTER — Ambulatory Visit (INDEPENDENT_AMBULATORY_CARE_PROVIDER_SITE_OTHER): Payer: Medicare Other | Admitting: Licensed Clinical Social Worker

## 2020-02-05 ENCOUNTER — Other Ambulatory Visit: Payer: Self-pay

## 2020-02-05 DIAGNOSIS — F902 Attention-deficit hyperactivity disorder, combined type: Secondary | ICD-10-CM

## 2020-02-05 DIAGNOSIS — F39 Unspecified mood [affective] disorder: Secondary | ICD-10-CM | POA: Diagnosis not present

## 2020-02-05 DIAGNOSIS — F1121 Opioid dependence, in remission: Secondary | ICD-10-CM | POA: Diagnosis not present

## 2020-02-05 DIAGNOSIS — F0634 Mood disorder due to known physiological condition with mixed features: Secondary | ICD-10-CM

## 2020-02-05 DIAGNOSIS — F411 Generalized anxiety disorder: Secondary | ICD-10-CM | POA: Diagnosis not present

## 2020-02-05 DIAGNOSIS — F1321 Sedative, hypnotic or anxiolytic dependence, in remission: Secondary | ICD-10-CM

## 2020-02-05 DIAGNOSIS — F1021 Alcohol dependence, in remission: Secondary | ICD-10-CM
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
## 2020-02-08 ENCOUNTER — Telehealth (INDEPENDENT_AMBULATORY_CARE_PROVIDER_SITE_OTHER): Payer: Medicare Other | Admitting: Medical

## 2020-02-08 ENCOUNTER — Encounter (HOSPITAL_COMMUNITY): Payer: Self-pay | Admitting: Medical

## 2020-02-08 ENCOUNTER — Ambulatory Visit: Payer: Medicare Other | Admitting: Addiction (Substance Use Disorder)

## 2020-02-08 DIAGNOSIS — I6381 Other cerebral infarction due to occlusion or stenosis of small artery: Secondary | ICD-10-CM

## 2020-02-08 DIAGNOSIS — F1121 Opioid dependence, in remission: Secondary | ICD-10-CM | POA: Diagnosis not present

## 2020-02-08 DIAGNOSIS — D330 Benign neoplasm of brain, supratentorial: Secondary | ICD-10-CM | POA: Diagnosis not present

## 2020-02-08 DIAGNOSIS — R413 Other amnesia: Secondary | ICD-10-CM

## 2020-02-08 DIAGNOSIS — G4710 Hypersomnia, unspecified: Secondary | ICD-10-CM

## 2020-02-08 DIAGNOSIS — F1321 Sedative, hypnotic or anxiolytic dependence, in remission: Secondary | ICD-10-CM

## 2020-02-08 DIAGNOSIS — F1021 Alcohol dependence, in remission: Secondary | ICD-10-CM

## 2020-02-08 DIAGNOSIS — F600 Paranoid personality disorder: Secondary | ICD-10-CM

## 2020-02-08 DIAGNOSIS — Z8659 Personal history of other mental and behavioral disorders: Secondary | ICD-10-CM

## 2020-02-08 DIAGNOSIS — Z923 Personal history of irradiation: Secondary | ICD-10-CM

## 2020-02-08 NOTE — Progress Notes (Addendum)
[place] MD/PA/NP OP Progress Note  02/08/2020 4:59 PM Paul Bray  MRN:  [id number]               Virtual Visit via Video Note I connected withTimothy Terrill Bray MY CHART enabled telemedicine application [place]@5 :00 pmand verified that I am speaking with the correct patient at his apartmentusing two identifiers.  I discussed the assessment and treatment plan with the patient. The patient was provided an opportunity to ask questions and all were answered. The patient agreed with the plan and demonstrated an understanding of the instructions.   The patient was advised to call back or seek an in-person evaluation if the symptoms worsen or if the condition fails to improve as anticipated.  I provided76minutes of non-face-to-face time during this encounter.   Chief Complaint:  Chief Complaint    Follow-up; Addiction Problem; Memory Loss; Paranoid     HPI: 1 week FU.Still hasnt gotten labs.Has gotten new Counselor at [facility] .Contim nur es with MAT at [facility] and gets other meds there including antipsychotic/amphetamine (ADHD)/ Hypersomnia med Modonafil. Continues to express paranoid delusions .  Visit Diagnosis:    ICD-10-CM   1. Severe opioid dependence in sustained remission on maintenance therapy (HCC)  F11.21   2. Alcohol use disorder, severe, in sustained remission ([place])  F10.21   3. Sedative, hypnotic or anxiolytic use disorder, moderate, in sustained remission (HCC)  F13.21   4. 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 S/P radiation therapy  Z92.3   6. Multiple lacunar infarcts (HCC)  I63.81   7. 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 Memory dysfunction  R41.3   9. Persistent hypersomnia  G47.10   10. History of ADHD  Z86.59       Past Medical History:  Past Medical History:  Diagnosis Date  . ADHD   . Brain tumor ([place])    balance and occ memory issues and headaches  . Brain tumor ([place])    [facility] [place]  . Headache    migraine and tension  . Hypothyroidism   . Soft tissue mass    left shoulder  . Substance abuse [facility])     Past Surgical History:  Procedure Laterality Date  . colonscopy    . gamma knife radiation 2018 for brain tumor'    . hematoma removed from arm Left   . MASS EXCISION Left 06/30/2019   Procedure: EXCISION OF SOFT TISSUE  MASS LEFT SHOULDER;  Surgeon: Armandina Gemma, MD;  Location: [facility];  Service: General;  Laterality: Left;  LMA      Allergies: No Known Allergies  Metabolic Disorder Labs: No results found for: HGBA1C, MPG No results found for: PROLACTIN No results found for: CHOL, TRIG, HDL, CHOLHDL, VLDL, LDLCALC Lab Results  Component Value Date   TSH 2.166 01/09/2018   TSH 1.459 08/03/2017    Therapeutic Level Labs:NA  Current Medications: Current Outpatient Medications  Medication Sig Dispense Refill  . acetaminophen (TYLENOL) 500 MG tablet Take 2,000 mg by mouth 2 (two) times daily as needed for headache (pain).     Marland Kitchen amphetamine-dextroamphetamine (ADDERALL XR) 25 MG 24 hr capsule     . benztropine (COGENTIN) 1 MG tablet Take 1 mg by mouth 2 (two) times daily.    . buprenorphine (SUBUTEX) 8 MG SUBL SL tablet Place 8 mg under the tongue 3 (three) times daily.    . busPIRone (BUSPAR) 10  MG tablet     . cephALEXin (KEFLEX) 500 MG capsule Take 500 mg by mouth 2 (two) times daily.    . clindamycin (CLEOCIN T) 1 % SWAB Apply 1 each topically 2 (two) times daily.    Marland Kitchen doxycycline (VIBRAMYCIN) 50 MG capsule     . gabapentin (NEURONTIN) 300 MG capsule Take 300 mg by mouth 2 (two) times daily as needed.    . hydrOXYzine (ATARAX/VISTARIL) 10 MG tablet Take 10 mg by mouth as needed.    Marland Kitchen levothyroxine (SYNTHROID, LEVOTHROID) 112 MCG tablet Take 112 mcg by mouth daily before breakfast.    . metroNIDAZOLE (METROGEL) 0.75 % gel Apply 1 application topically at bedtime.    . modafinil (PROVIGIL) 200 MG tablet Take 200 mg by mouth  2 (two) times daily.    . propranolol (INDERAL) 10 MG tablet Take 1 tablet (10 mg total) by mouth 3 (three) times daily. 90 tablet 2  . risperiDONE (RISPERDAL) 1 MG tablet Take 1 mg by mouth at bedtime.    . risperiDONE (RISPERDAL) 2 MG tablet Take 2 mg by mouth 2 (two) times daily.    . tazarotene (AVAGE) 0.1 % cream Apply 1 application topically daily.    Marland Kitchen testosterone cypionate (DEPOTESTOSTERONE CYPIONATE) 200 MG/ML injection Inject 200 mg into the muscle every 30 (thirty) days.    Marland Kitchen topiramate (TOPAMAX) 100 MG tablet Take 100 mg by mouth at bedtime.     No current facility-administered medications for this visit.     Musculoskeletal: Strength & Muscle Tone: Telepsych visit-Grossly normal Musculoskeletal and cranial nerve inspections Gait & Station: NA Patient leans: N/A Psychiatric Specialty Exam: Review of Systems  Constitutional: Negative for activity change, appetite change, chills, diaphoresis, fatigue, fever and unexpected weight change.  Neurological: Negative for dizziness, tremors, seizures, syncope, facial asymmetry, speech difficulty, weakness, light-headedness, numbness and headaches.  Psychiatric/Behavioral: Positive for agitation, dysphoric mood and sleep disturbance (Modafanil corrects his hypersomnia). Negative for behavioral problems, confusion, decreased concentration, hallucinations, self-injury and suicidal ideas. The patient is nervous/anxious. The patient is not hyperactive.     There were no vitals taken for this visit.There is no height or weight on file to calculate BMI.MYCHART VISIT  General Appearance: Disheveled  Eye Contact:  Fair  Speech:  Pressured  Volume:  Variable  Mood:  Irritable  Affect:  Congruent  Thought Process:  Disorganized and Descriptions of Associations: Circumstantial  Orientation:  Full (Time, Place, and Person)  Thought Content: Delusions, Obsessions, Paranoid Ideation and Rumination   Suicidal Thoughts:  No  Homicidal Thoughts:   No  Memory:  Traumatic  Judgement:  Impaired  Insight:  Lacking  Psychomotor Activity:  NA  Concentration:  Concentration: Good and Attention Span: Fair  Recall:  see memory  Fund of Knowledge: WDL  Language: Good  Akathisia:  Negative  Handed:  Right  AIMS (if indicated): My Chart limits  Assets:  Desire for Improvement Financial Resources/Insurance Housing Transportation Vocational/Educational  ADL's:  Impaired  Cognition: Impaired,  Moderate  Sleep:  Impaired     Assessment: Seems to be getting worse Paranoid wise   ;and Plan: awaiting labs;awaiting Radiology consult.Pt to schedule FU after labs   [name], PA-C 02/08/2020, 4:59 PM

## 2020-02-15 ENCOUNTER — Ambulatory Visit (INDEPENDENT_AMBULATORY_CARE_PROVIDER_SITE_OTHER): Payer: Medicare Other | Admitting: Licensed Clinical Social Worker

## 2020-02-15 ENCOUNTER — Other Ambulatory Visit: Payer: Self-pay

## 2020-02-15 ENCOUNTER — Ambulatory Visit: Payer: Medicare Other | Admitting: Addiction (Substance Use Disorder)

## 2020-02-15 ENCOUNTER — Ambulatory Visit (HOSPITAL_COMMUNITY): Payer: Medicare Other | Admitting: Licensed Clinical Social Worker

## 2020-02-15 DIAGNOSIS — F1121 Opioid dependence, in remission: Secondary | ICD-10-CM | POA: Diagnosis not present

## 2020-02-15 DIAGNOSIS — F1021 Alcohol dependence, in remission: Secondary | ICD-10-CM

## 2020-02-15 DIAGNOSIS — F902 Attention-deficit hyperactivity disorder, combined type: Secondary | ICD-10-CM | POA: Diagnosis not present

## 2020-02-15 DIAGNOSIS — F411 Generalized anxiety disorder: Secondary | ICD-10-CM | POA: Diagnosis not present

## 2020-02-15 DIAGNOSIS — F39 Unspecified mood [affective] disorder: Secondary | ICD-10-CM | POA: Diagnosis not present

## 2020-02-15 DIAGNOSIS — F1321 Sedative, hypnotic or anxiolytic dependence, in remission: Secondary | ICD-10-CM
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
## 2020-02-19 ENCOUNTER — Other Ambulatory Visit: Payer: Self-pay

## 2020-02-19 ENCOUNTER — Ambulatory Visit (INDEPENDENT_AMBULATORY_CARE_PROVIDER_SITE_OTHER): Payer: Medicare Other | Admitting: Licensed Clinical Social Worker

## 2020-02-19 DIAGNOSIS — F411 Generalized anxiety disorder: Secondary | ICD-10-CM

## 2020-02-19 DIAGNOSIS — F902 Attention-deficit hyperactivity disorder, combined type: Secondary | ICD-10-CM

## 2020-02-19 DIAGNOSIS — F1121 Opioid dependence, in remission: Secondary | ICD-10-CM

## 2020-02-19 DIAGNOSIS — F39 Unspecified mood [affective] disorder: Secondary | ICD-10-CM | POA: Diagnosis not present

## 2020-02-19 DIAGNOSIS — F1321 Sedative, hypnotic or anxiolytic dependence, in remission: Secondary | ICD-10-CM

## 2020-02-19 DIAGNOSIS — F1021 Alcohol dependence, in remission: Secondary | ICD-10-CM

## 2020-02-19 NOTE — Progress Notes (Signed)
Virtual Visit via Telephone Note   I connected with [name] on 02/19/20 at 9:15am by telephone and verified that I am speaking with the correct person using two identifiers.   I discussed the limitations, risks, security and privacy concerns of performing an evaluation and management service by telephone and the availability of in person appointments. I also discussed with the patient that there may be a patient responsible charge related to this service. The patient expressed understanding and agreed to proceed.    I discussed the assessment and treatment plan with the patient. The patient was provided an opportunity to ask questions and all were answered. The patient agreed with the plan and demonstrated an understanding of the instructions.   The patient was advised to call back or seek an in-person evaluation if the symptoms worsen or if the condition fails to improve as anticipated.   I provided 45 minutes of non-face-to-face time during this encounter.     Shade Flood, LCSW, LCAS ____________________ THERAPIST PROGRESS NOTE   Session Time: 9:15am - 10:00am  Location: Patient: Patient Home Therapist: [place] OPT Office   Participation Level: Active   Behavioral Response: Alert, anxious mood   Type of Therapy: Individual Therapy    Treatment Goals addressed: Coping with depression and anxiety; Medication compliance    Interventions: CBT   Summary: Berthel Bagnall is a 54 year old divorced Caucasian male that presented today for telephone session with diagnosis of Generalized anxiety disorder; Unspecified mood (affective) disorder; ADHD, combined type; Severe opioid dependence in sustained remission; Alcohol use disorder, severe, in sustained remission; Sedative, hypnotic, or anxiolytic use disorder, moderate, in sustained remission.    Suicidal/Homicidal: None; without intent or plan.    Therapist Response:  Clinician contacted Kerry by phone today at 9:07am when he  did not present for virtual appointment on time.  Darran answered this phone call and reported that he had forgotten about appointment, and would log in shortly.  Clinician called Zahki back at 9:15am when he still hadn't presented to virtual meeting, and Huriel reported that he was having connectivity issues and would need to speak today via phone call.  Clinician encouraged Donye to set a reminder on his phone, computer, and/or calendar next time to avoid future tardiness and informed him of no-show and cancellation policies should he continue to present late for appointments.  Cole acknowledged that he understood.  Clinician assessed for safety, medication compliance, and sobriety.  Chancelor spoke in a manner that was alert, oriented x5, with no evidence or self-report of SI/HI or A/VH.  Rutilio reported compliance with medications and denied any use of alcohol or illicit substances.  Clinician inquired about Kiron's current emotional ratings, as well as any significant changes in thoughts, feelings, or behavior since last check-in.  Davine reported scores of 5/10 for depression, 8/10 for anxiety, and noted that he has continued to regularly have panic episodes almost daily.  Clinician inquired about present stressors that might be influencing these panic episodes, and self-care activities that could promote more effective coping through distraction and skill building, such as playing his guitar.  Julius reported that he has been less fixated upon his arms, but he did outreach some dermatologists after contacting insurance, and the doctors at those practices are booked out for months.  Trindon did not sound as agitated today or perturbed about this wait, and noted that he has been playing guitar each day, and reflecting upon how he used to be more egotistical, and  would like to do things that benefit others, stating "I've come a long way since then".  Clinician discussed outlets with [place] for  helping others within his means while improving social connectedness, such as attending mental health support groups, or volunteering at [employer] which tackle issues important to him, such as homelessness and poverty.  Interventions were effective, as evidenced by Christia Reading reporting that this session was helpful for redirecting his attention towards productive, healthy uses of time which could improve mood and increase socialization, stating "I want to become a better person, a kinder person".  He acknowledged that he could expand upon his self-care website to address social issues and encourage dialogue with other people who are passionate about these subjects as well.  Clinician will continue to monitor.      Plan: Meet again virtually in 1 week.   Diagnosis: Generalized anxiety disorder; Unspecified mood (affective) disorder; ADHD, combined type; Severe opioid dependence in sustained remission; Alcohol use disorder, severe, in sustained remission; Sedative, hypnotic, or anxiolytic use disorder, moderate, in sustained remission.   Shade Flood, LCSW, LCAS 02/19/2020

## 2020-02-20 ENCOUNTER — Other Ambulatory Visit: Payer: Self-pay

## 2020-02-20 ENCOUNTER — Telehealth (HOSPITAL_COMMUNITY): Payer: Self-pay | Admitting: Licensed Clinical Social Worker

## 2020-02-20 ENCOUNTER — Ambulatory Visit (INDEPENDENT_AMBULATORY_CARE_PROVIDER_SITE_OTHER): Payer: Medicare Other | Admitting: Licensed Clinical Social Worker

## 2020-02-20 DIAGNOSIS — F39 Unspecified mood [affective] disorder: Secondary | ICD-10-CM | POA: Diagnosis not present

## 2020-02-20 DIAGNOSIS — F1121 Opioid dependence, in remission: Secondary | ICD-10-CM | POA: Diagnosis not present

## 2020-02-20 DIAGNOSIS — F1021 Alcohol dependence, in remission: Secondary | ICD-10-CM

## 2020-02-20 DIAGNOSIS — F411 Generalized anxiety disorder: Secondary | ICD-10-CM

## 2020-02-20 DIAGNOSIS — F1321 Sedative, hypnotic or anxiolytic dependence, in remission: Secondary | ICD-10-CM

## 2020-02-20 DIAGNOSIS — F902 Attention-deficit hyperactivity disorder, combined type: Secondary | ICD-10-CM | POA: Diagnosis not present
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
## 2020-02-20 NOTE — Telephone Encounter (Signed)
Call completed sucessfully.  Please see additional note today for detailed information on this encounter.

## 2020-02-21 ENCOUNTER — Telehealth (HOSPITAL_COMMUNITY): Payer: Self-pay | Admitting: Psychiatry

## 2020-02-21 ENCOUNTER — Ambulatory Visit: Payer: Medicare Other | Admitting: Addiction (Substance Use Disorder)

## 2020-02-21 NOTE — Telephone Encounter (Signed)
D:  Shade Flood, LCSW referred pt to MH-IOP.  A:  Called and oriented pt.  Encouraged pt to verify his insurance benefits.  He will start [facility]-I[facility] tomorrow at 9 a.m.  Informed Shade Flood, LCSW.  R: Pt receptive.

## 2020-02-21 NOTE — Telephone Encounter (Signed)
D:  Shade Flood, LCSW referred pt to MH-IOP.  A:  Placed call to orient pt, but there was no answer.  Will attempt again later.  Inform Cory.

## 2020-02-22 ENCOUNTER — Other Ambulatory Visit: Payer: Self-pay

## 2020-02-22 ENCOUNTER — Telehealth (HOSPITAL_COMMUNITY): Payer: Self-pay | Admitting: Psychiatry

## 2020-02-22 ENCOUNTER — Ambulatory Visit (HOSPITAL_COMMUNITY): Payer: Medicare Other | Admitting: Licensed Clinical Social Worker

## 2020-02-22 ENCOUNTER — Other Ambulatory Visit (HOSPITAL_COMMUNITY): Payer: Medicare Other

## 2020-02-22 NOTE — Telephone Encounter (Signed)
D:  Pt was scheduled to start MH-IOP today at 9 a.m, but never logged into the group.  Case manager kept attempting to reach patient, but there was no answer.  Will attempt again this afternoon.  A:  Informed pt's therapist Shade Flood, [place]).

## 2020-02-23 ENCOUNTER — Other Ambulatory Visit (HOSPITAL_COMMUNITY): Payer: Medicare Other

## 2020-02-23 ENCOUNTER — Other Ambulatory Visit: Payer: Self-pay

## 2020-02-23 ENCOUNTER — Telehealth (HOSPITAL_COMMUNITY): Payer: Self-pay | Admitting: Psychiatry

## 2020-02-26 ENCOUNTER — Ambulatory Visit (HOSPITAL_COMMUNITY): Payer: Medicare Other

## 2020-02-27 ENCOUNTER — Other Ambulatory Visit: Payer: Self-pay

## 2020-02-27 ENCOUNTER — Ambulatory Visit (HOSPITAL_COMMUNITY): Payer: Medicare Other

## 2020-02-27 ENCOUNTER — Encounter (HOSPITAL_COMMUNITY): Payer: Self-pay | Admitting: Family

## 2020-02-27 ENCOUNTER — Other Ambulatory Visit (HOSPITAL_COMMUNITY): Payer: Medicare Other | Attending: Family | Admitting: Family

## 2020-02-27 DIAGNOSIS — F17210 Nicotine dependence, cigarettes, uncomplicated: Secondary | ICD-10-CM | POA: Insufficient documentation

## 2020-02-27 DIAGNOSIS — F39 Unspecified mood [affective] disorder: Secondary | ICD-10-CM

## 2020-02-27 DIAGNOSIS — F1121 Opioid dependence, in remission: Secondary | ICD-10-CM | POA: Insufficient documentation

## 2020-02-27 DIAGNOSIS — Z79899 Other long term (current) drug therapy: Secondary | ICD-10-CM | POA: Diagnosis not present

## 2020-02-27 DIAGNOSIS — F411 Generalized anxiety disorder: Secondary | ICD-10-CM | POA: Insufficient documentation

## 2020-02-27 DIAGNOSIS — F32A Depression, unspecified: Secondary | ICD-10-CM | POA: Insufficient documentation

## 2020-02-27 DIAGNOSIS — F1021 Alcohol dependence, in remission: Secondary | ICD-10-CM | POA: Insufficient documentation

## 2020-02-27 DIAGNOSIS — F902 Attention-deficit hyperactivity disorder, combined type: Secondary | ICD-10-CM | POA: Diagnosis not present

## 2020-02-27 NOTE — Progress Notes (Signed)
Virtual Visit via Video Note  I connected with ANGELLO CHIEN on 02/27/20 at  9:00 AM EDT by a video enabled telemedicine application and verified that I am speaking with the correct person using two identifiers.  At orientation to the IOP program, Case Manager discussed the limitations of evaluation and management by telemedicine and the availability of in person appointments. The patient expressed understanding and agreed to proceed with virtual visits throughout the duration of the program.   Location:  Patient: Patient Home Provider: Home Office   History of Present Illness: GAD and Mood DO  Observations/Objective: Check In: Case Manager checked in with all participants to review discharge dates, insurance authorizations, work-related documents and needs for the treatment team. Counselor facilitated a check-in with group members to assess mood and current functioning. Client is new to the group today, sharing need for treatment, expectations for treatment, about his support system and current life stressors. Client apprehensive about joining group, dealing with paranoia and memory issues. Client stated that he is feeling welcome so far. Client presents with severe depression and severe anxiety. Client denied any current SI/HI/psychosis.   Initial Therapeutic Activity: Counselor provided psychoeducation on comorbidity of Mental Health and Substance Abuse conditions. Counselor opened discussion for group members to share about family history and personal history of addiction or problem areas of addiction. Counselor provided group members with strategies in combatting addiction and resources for professional help/support groups. Client reports that he is 2 years sober from substances. Client shared about sobriety journey and continual efforts to remain sober.  Second Therapeutic Activity: Counselor prompted group members to self-assess the following areas in relation to self-care practices:  Personal, Professional, Psychological, Emotional, Spiritual, and Physical. Counselor provided group members with the [place], showing creative and practical ways to add more self-care in these life domains.  Counselor additionally provided group members with 2 exhaustive lists of coping skills and strategies for dealing with stress, anxiety, depression and other mental health/life challenges.  Check Out: Counselor closed program by prompting group members to share one self-care practice or productivity activity they can complete today to reduce anxiety. Client stated that they plan to look into resources shared in group. Client indicated that they are not currently a risk to self of others before signing off.  Assessment and Plan: Clinician recommends that Client remain in IOP treatment to better manage mental health symptoms, stabilization and to address treatment plan goals. Clinician recommends adherence to crisis/safety plan, taking medications as prescribed, and following up with medical professionals if any issues arise.   Follow Up Instructions: Clinician will send Webex link for next session. The Client was advised to call back or seek an in-person evaluation if the symptoms worsen or if the condition fails to improve as anticipated.     I provided 180 minutes of non-face-to-face time during this encounter.     Lise Auer, LCSW

## 2020-02-27 NOTE — Progress Notes (Addendum)
Virtual Visit via Video Note  I connected with Paul Bray on 02/27/20 at  9:00 AM EDT by a video enabled telemedicine application and verified that I am speaking with the correct person using two identifiers.  Location: Patient: Home Provider: PHP   I discussed the limitations of evaluation and management by telemedicine and the availability of in person appointments. The patient expressed understanding and agreed to proceed.  I discussed the assessment and treatment plan with the patient. The patient was provided an opportunity to ask questions and all were answered. The patient agreed with the plan and demonstrated an understanding of the instructions.   The patient was advised to call back or seek an in-person evaluation if the symptoms worsen or if the condition fails to improve as anticipated.  I provided 15 minutes of non-face-to-face time during this encounter.   Derrill Center, NP    Psychiatric Initial Adult Assessment   Patient Identification: Paul Bray MRN:  [id number] Date of Evaluation:  02/27/2020 Referral Source: Therapist Georgina Snell Chief Complaint:  Depression Visit Diagnosis: No diagnosis found.  History of Present Illness:  Jarmar Bray was seen via teleassessment.  He is awake, alert and oriented x3.  Patient presents pleasant but slow to respond throughout this assessment.  Patient presents slightly tangential but redirectable.  Reports needing social support to manage stressors.  Isaid states" I just cannot get along with my neighbors and I care about they think. " Reported history with brain tumor and substance abuse history.  Patient is currently followed by therapy and psychiatry.  Reports mood irritability, depression panic attacks.  Does reports a history of hallucinations however is currently denying.  Patient to start Intensive outpatient programming on February 27, 2020.   Associated Signs/Symptoms: Depression Symptoms:  depressed  mood, feelings of worthlessness/guilt, difficulty concentrating, (Hypo) Manic Symptoms:  Distractibility, Anxiety Symptoms:  Excessive Worry, Panic Symptoms, Social Anxiety, Psychotic Symptoms:  Hallucinations: None PTSD Symptoms: NA  Past Psychiatric History:   Previous Psychotropic Medications: No   Substance Abuse History in the last 12 months:  No.  Consequences of Substance Abuse: NA  Past Medical History:  Past Medical History:  Diagnosis Date  . ADHD   . Brain tumor ([place])    balance and occ memory issues and headaches  . Brain tumor ([place])    sees [facility] q year  . Headache    migraine and tension  . Hypothyroidism   . Soft tissue mass    left shoulder  . Substance abuse [facility])     Past Surgical History:  Procedure Laterality Date  . colonscopy    . gamma knife radiation 2018 for brain tumor'    . hematoma removed from arm Left   . MASS EXCISION Left 06/30/2019   Procedure: EXCISION OF SOFT TISSUE  MASS LEFT SHOULDER;  Surgeon: Armandina Gemma, MD;  Location: [place];  Service: General;  Laterality: Left;  LMA    Family Psychiatric History:   Family History: No family history on file.  Social History:   Social History   Socioeconomic History  . Marital status: Single    Spouse name: Not on file  . Number of children: Not on file  . Years of education: Not on file  . Highest education level: Not on file  Occupational History  . Not on file  Tobacco Use  . Smoking status: Current Every Day Smoker    Types: Cigarettes  . Smokeless tobacco: Never Used  .  Tobacco comment: quit jan 2020  Vaping Use  . Vaping Use: Never used  Substance and Sexual Activity  . Alcohol use: Not Currently    Comment: quit oct 2019  . Drug use: Yes    Types: Cocaine, IV    Comment: last used cocaine 2019 per pt  . Sexual activity: Not on file  Other Topics Concern  . Not on file  Social History Narrative  . Not on file   Social Determinants of  Health   Financial Resource Strain:   . Difficulty of Paying Living Expenses: Not on file  Food Insecurity:   . Worried About Charity fundraiser in the Last Year: Not on file  . Ran Out of Food in the Last Year: Not on file  Transportation Needs:   . Lack of Transportation (Medical): Not on file  . Lack of Transportation (Non-Medical): Not on file  Physical Activity:   . Days of Exercise per Week: Not on file  . Minutes of Exercise per Session: Not on file  Stress:   . Feeling of Stress : Not on file  Social Connections:   . Frequency of Communication with Friends and Family: Not on file  . Frequency of Social Gatherings with Friends and Family: Not on file  . Attends Religious Services: Not on file  . Active Member of Clubs or Organizations: Not on file  . Attends Archivist Meetings: Not on file  . Marital Status: Not on file    Additional Social History:   Allergies:  No Known Allergies  Metabolic Disorder Labs: No results found for: HGBA1C, MPG No results found for: PROLACTIN No results found for: CHOL, TRIG, HDL, CHOLHDL, VLDL, LDLCALC Lab Results  Component Value Date   TSH 2.166 01/09/2018    Therapeutic Level Labs: No results found for: LITHIUM No results found for: CBMZ No results found for: VALPROATE  Current Medications: Current Outpatient Medications  Medication Sig Dispense Refill  . acetaminophen (TYLENOL) 500 MG tablet Take 2,000 mg by mouth 2 (two) times daily as needed for headache (pain).     Marland Kitchen amphetamine-dextroamphetamine (ADDERALL XR) 25 MG 24 hr capsule     . benztropine (COGENTIN) 1 MG tablet Take 1 mg by mouth 2 (two) times daily.    . buprenorphine (SUBUTEX) 8 MG SUBL SL tablet Place 8 mg under the tongue 3 (three) times daily.    . busPIRone (BUSPAR) 10 MG tablet     . cephALEXin (KEFLEX) 500 MG capsule Take 500 mg by mouth 2 (two) times daily.    . clindamycin (CLEOCIN T) 1 % SWAB Apply 1 each topically 2 (two) times daily.     Marland Kitchen doxycycline (VIBRAMYCIN) 50 MG capsule     . gabapentin (NEURONTIN) 300 MG capsule Take 300 mg by mouth 2 (two) times daily as needed.    . hydrOXYzine (ATARAX/VISTARIL) 10 MG tablet Take 10 mg by mouth as needed.    Marland Kitchen levothyroxine (SYNTHROID, LEVOTHROID) 112 MCG tablet Take 112 mcg by mouth daily before breakfast.    . metroNIDAZOLE (METROGEL) 0.75 % gel Apply 1 application topically at bedtime.    . modafinil (PROVIGIL) 200 MG tablet Take 200 mg by mouth 2 (two) times daily.    . propranolol (INDERAL) 10 MG tablet Take 1 tablet (10 mg total) by mouth 3 (three) times daily. 90 tablet 2  . risperiDONE (RISPERDAL) 1 MG tablet Take 1 mg by mouth at bedtime.    . risperiDONE (RISPERDAL) 2  MG tablet Take 2 mg by mouth 2 (two) times daily.    . tazarotene (AVAGE) 0.1 % cream Apply 1 application topically daily.    Marland Kitchen testosterone cypionate (DEPOTESTOSTERONE CYPIONATE) 200 MG/ML injection Inject 200 mg into the muscle every 30 (thirty) days.    Marland Kitchen topiramate (TOPAMAX) 100 MG tablet Take 100 mg by mouth at bedtime.     No current facility-administered medications for this visit.    Musculoskeletal:   Psychiatric Specialty Exam: Review of Systems  There were no vitals taken for this visit.There is no height or weight on file to calculate BMI.  General Appearance: Casual  Eye Contact:  Good  Speech:  Clear and Coherent and Slow  Volume:  Normal  Mood:  Anxious and Depressed  Affect:  Congruent  Thought Process:  Coherent  Orientation:  Full (Time, Place, and Person)  Thought Content:  Logical  Suicidal Thoughts:  No  Homicidal Thoughts:  No  Memory:  Immediate;   Fair Recent;   Fair  Judgement:  Intact  Insight:  Fair  Psychomotor Activity:  Normal  Concentration:  Concentration: Fair  Recall:  [employer] of Knowledge:Fair  Language: Fair  Akathisia:  No  Handed:  Right  AIMS (if indicated):   Assets:  Communication Skills Desire for Improvement Social Support  ADL's:   Intact  Cognition: WNL  Sleep:  Fair   Screenings:  Assessment and Plan:  Start intensive outpatient programming Continue medication as directed.   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   Chemical Use History: Reported history with multiple illicit substance abuse history.  Reported sobriety since 2019  Progress in Program Toward Treatment Goals: As reported by staff patient is disorganized, tangential and limited processing abilities.  Patient may benefit from inpatient admission.  See chart for attempted out reach by staff to patient.  Progress (rationale): -[place] inpatient admission and/or follow-up with neurology.   Take all medications as prescribed. Keep all follow-up appointments as scheduled.  Do not consume alcohol or use illegal drugs while on prescription medications. Report any adverse effects from your medications to your primary care provider promptly.  In the event of recurrent symptoms or worsening symptoms, call 911, a crisis hotline, or go to the nearest emergency department for evaluation.   Derrill Center, NP 03/02/2020

## 2020-02-27 NOTE — Progress Notes (Signed)
Virtual Visit via Video Note  I connected with Paul Bray on @TODAY @ at  9:00 AM EDT by a video enabled telemedicine application and verified that I am speaking with the correct person using two identifiers.  Location: Patient: at home Provider: at office (virtual)   I discussed the limitations of evaluation and management by telemedicine and the availability of in person appointments. The patient expressed understanding and agreed to proceed.  I discussed the assessment and treatment plan with the patient. The patient was provided an opportunity to ask questions and all were answered. The patient agreed with the plan and demonstrated an understanding of the instructions.   The patient was advised to call back or seek an in-person evaluation if the symptoms worsen or if the condition fails to improve as anticipated.  I provided 30 minutes of non-face-to-face time during this encounter.   Patient ID: Paul Bray, male   DOB: May 27, 1965, 54 y.o.   MRN: [id number] As per previous CCA states: Paul Bray is a 54 year old divorced Caucasian male on disability that presented for clinical assessment today following referral for new therapist by long term psychiatrist.  Paul Bray reported that he had a good relationship with previous therapist and was speaking to them often over course of past 7 months before he felt like they no longer had as much time and/or energy to focus on his concerns.  Paul Bray presented for virtual session today on time and was alert, oriented x5, with no evidence or self-report of SI/HI or A/V H.  Paul Bray reported that he has a physician that specializes in addiction and prescribes him a number of behavioral medications, all of which he is compliant with and finds helpful for some symptom stabilization.  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  Paul Bray reported that he acquired sobriety in September 2019, went to [facility] for assistance and MAT.  He reported that he no longer attends recovery meetings, but does have a sponsor he stays in touch with.  Paul Bray denied any history of self-harm, suicide attempts, or HI.  He reported that he has had anger problems over a decade in the past that led to legal charges which he claims were provoked by other parties first, and did not care to elaborate further.  Pt started MH-IOP (virtual) today.  He was supposed to have started a couple of days ago but was having technical problems with his computer. Pt states he is looking forward to attending d/t his increased isolation from others. A:  Oriented pt.  Pt gave verbal consent for treatment, to release chart information to referred providers and to complete any forms if needed.  Pt also gave consent for attending group virtually d/t COVID-19 social distancing restrictions.  Encouraged support groups.  F/U with Paul Russian, PA-C and Paul Flood, LCSW. R:  Pt receptive.  Paul Bray, M.Ed, CNA

## 2020-02-28 ENCOUNTER — Other Ambulatory Visit: Payer: Self-pay

## 2020-02-28 ENCOUNTER — Telehealth (HOSPITAL_COMMUNITY): Payer: Self-pay | Admitting: Psychiatry

## 2020-02-28 ENCOUNTER — Other Ambulatory Visit (HOSPITAL_COMMUNITY): Payer: Medicare Other

## 2020-02-28 ENCOUNTER — Telehealth (HOSPITAL_COMMUNITY): Payer: Self-pay | Admitting: Licensed Clinical Social Worker

## 2020-02-28 ENCOUNTER — Ambulatory Visit (HOSPITAL_COMMUNITY): Payer: Medicare Other

## 2020-02-28 DIAGNOSIS — F1321 Sedative, hypnotic or anxiolytic dependence, in remission: Secondary | ICD-10-CM

## 2020-02-28 DIAGNOSIS — F600 Paranoid personality disorder: Secondary | ICD-10-CM

## 2020-02-28 DIAGNOSIS — D330 Benign neoplasm of brain, supratentorial: Secondary | ICD-10-CM

## 2020-02-28 DIAGNOSIS — Z923 Personal history of irradiation: Secondary | ICD-10-CM

## 2020-02-28 DIAGNOSIS — F1121 Opioid dependence, in remission: Secondary | ICD-10-CM

## 2020-02-28 DIAGNOSIS — I6381 Other cerebral infarction due to occlusion or stenosis of small artery: Secondary | ICD-10-CM

## 2020-02-28 DIAGNOSIS — F39 Unspecified mood [affective] disorder: Secondary | ICD-10-CM

## 2020-02-28 DIAGNOSIS — R413 Other amnesia: Secondary | ICD-10-CM

## 2020-02-28 DIAGNOSIS — F1021 Alcohol dependence, in remission: Secondary | ICD-10-CM

## 2020-02-28 NOTE — Telephone Encounter (Signed)
D:  Placed a second call to pt on this # [phone] but there was no answer; previously had call pt on this # [phone]) and there's no answer there either.  Pt still isn't answering his phones; nor did he log into virtual MH-IOP this morning.  A:  Case manager was trying to reach patient in order to discuss a higher level of treatment [facility] or ACT Team) for pt d/t his paranoid-delusional thinking that apparently worsening. If case manager doesn't hear back from patient; will call for a safety check to be done today.  Inform Ricky Ala, NP, Beather Arbour (clinical nurse mgr), Lise Auer, LCSW and Shade Flood, [place].  Will also inform Risk Management per [facility] request.

## 2020-02-28 NOTE — Telephone Encounter (Signed)
D:  Corresponded with [name], PA-C to inform him about patient's decompensation.  According to Paul Bray he has been following patient in the [employer].  Charles voiced concern about patient's brain tumor.  He inquired if patient went to have labs done that he ordered. Paul Bray said he hopes that pt will contact his office for another appointment.  States he will follow up with patient.  A:  Inform Ricky Ala, NP , Shade Flood, LCSW and Beather Arbour, RN (clinical mgr).

## 2020-02-28 NOTE — Telephone Encounter (Signed)
Please see prior telephone outreach note from clinician earlier today.  Paul Bray responded to clinician's previous outreach attempt by sending email at 12:19PM confirming that he received voicemail, and reassuring that he is not in crisis at this time and does not need assistance.  Paul Bray reported that he no longer intends to receive services at [facility], so clinician will cancel future appointments at clinic and have him discharged from practice.  A letter informing him of this decision will be mailed promptly.  Clinician also consulted with Paul Bray's PA-C, Juanda Crumble, who is aware of recent developments, and confirmed he will continue to assist Paul Bray with medications at [facility].    Shade Flood, [place], LCAS 02/28/20

## 2020-02-28 NOTE — Progress Notes (Signed)
Virtual Visit via Telephone Note  I connected with Paul Bray on @TODAY @ at 1315 by telephone and verified that I am speaking with the correct person using two identifiers.  Location: Patient: home Provider: office   I discussed the limitations, risks, security and privacy concerns of performing an evaluation and management service by telephone and the availability of in person appointments. I also discussed with the patient that there may be a patient responsible charge related to this service. The patient expressed understanding and agreed to proceed.  I discussed the assessment and treatment plan with the patient. The patient was provided an opportunity to ask questions and all were answered. The patient agreed with the plan and demonstrated an understanding of the instructions.   The patient was advised to call back or seek an in-person evaluation if the symptoms worsen or if the condition fails to improve as anticipated.  I provided 0-5 minutes of non-face-to-face time during this encounter.  Patient ID: Paul Bray, male   DOB: [birth year], 54 y.o.   MRN: [id number]  As per previous CCA states: Paul Bray is a 54 year old divorced Caucasian male on disability that presented for clinical assessment today following referral for new therapist by long term psychiatrist.  Paul Bray reported that he had a good relationship with previous therapist and was speaking to them often over course of past 7 months before he felt like they no longer had as much time and/or energy to focus on his concerns.  Paul Bray presented for virtual session today on time and was alert, oriented x5, with no evidence or self-report of SI/HI or A/V H.  Paul Bray reported that he has a physician that specializes in addiction and prescribes him a number of behavioral medications, all of which he is compliant with and finds helpful for some symptom stabilization.  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  Paul Bray reported that he acquired sobriety in September 2019, went to [facility] for assistance and MAT.  He reported that he no longer attends recovery meetings, but does have a sponsor he stays in touch with.  Paul Bray denied any history of self-harm, suicide attempts, or HI.  He reported that he has had anger problems over a decade in the past that led to legal charges which he claims were provoked by other parties first, and did not care to elaborate further.   Pt started MH-IOP (virtual) today.  He was supposed to have started a couple of days ago but was having technical problems with his computer.  Patient will be discharged per his request and him needing a higher level of treatment. According to the emails that group leader Paul Bray, [place]) forwarded to this case manager; pt had sent her various emails since group ended yesterday.  The emails extended until 4 a.m this morning.  The emails exhibited paranoid-delusional thinking.  The emails didn't voice any concerns that he was a danger to himself or others, but whenever he wouldn't answer the case mgr's phone calls she was going to request a safety check.  Pt did reach back out to his therapist Paul Bray, Paul Bray), stating that he was done with [facility] and  to not worry about it b/c it wasn't an emergency. Therefore, since pt reached out to his therapist, case manager will not pursue a safety check or reach out to Risk Management. Pt will be discharged from [facility] since he mentioned that he is "done with [facility]." A:  Discharge on 02-28-20.  Strongly recommend  pt to go for an assessment at [facility] or go to [facility].  Encouraged  support groups.  Also, recommend for pt to contact his insurance company for approved providers to follow up with since he doesn't want [facility] providers.  Inform Paul Ala, NP and Paul Arbour, RN (clinical mgr).

## 2020-02-28 NOTE — Telephone Encounter (Signed)
Paul Bray emailed clinician 4 times since yesterday expressing dissatisfaction with MHIOP groups and requesting phone call to discuss his concerns.  Paul Bray's emails appeared to exhibit scattered persecutory, delusional thinking and requested all further appointments for therapy be cancelled. Paul Bray's emails did not indicate any risk of harm to self or others. Clinician outreached Paul Bray's cell phone at 11:13am and phone rang without answer for 2 minutes, but voicemail was not set up.  Clinician then Paul Bray at 11:15am, received no answer, but was able to leave a voicemail.  In message that was left, clinician confirmed receipt of emails and intent of call perform safety check on him based upon increased delusional thinking.  Clinician recommended that Paul Bray consider admission to higher level of care, and go visit behavioral health hospital for assessment today.  Clinician provided contact numbers for 911, [facility] mobile crisis line, and clinician's office number to return call.  Clinician also encouraged Paul Bray to contact his insurance company for other mental health professionals that are in network to serve his needs based upon dissatisfaction with our services.  Clinician will outreach primary medication provider in office for coordination of care.    Shade Flood, [place], LCAS 02/28/20

## 2020-02-29 ENCOUNTER — Other Ambulatory Visit: Payer: Self-pay

## 2020-02-29 ENCOUNTER — Other Ambulatory Visit (HOSPITAL_COMMUNITY): Payer: Medicare Other

## 2020-02-29 ENCOUNTER — Ambulatory Visit (HOSPITAL_COMMUNITY): Payer: Medicare Other

## 2020-02-29 ENCOUNTER — Encounter (HOSPITAL_COMMUNITY): Payer: Self-pay | Admitting: Licensed Clinical Social Worker

## 2020-02-29 ENCOUNTER — Ambulatory Visit (HOSPITAL_COMMUNITY): Payer: Medicare Other | Admitting: Licensed Clinical Social Worker

## 2020-03-01 ENCOUNTER — Ambulatory Visit (HOSPITAL_COMMUNITY): Payer: Medicare Other

## 2020-03-04 ENCOUNTER — Ambulatory Visit (HOSPITAL_COMMUNITY): Payer: Medicare Other
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
## 2020-03-05 ENCOUNTER — Ambulatory Visit (HOSPITAL_COMMUNITY): Payer: Medicare Other

## 2020-03-06 ENCOUNTER — Ambulatory Visit (HOSPITAL_COMMUNITY): Payer: Medicare Other

## 2020-03-07 ENCOUNTER — Ambulatory Visit (HOSPITAL_COMMUNITY): Payer: Medicare Other

## 2020-03-07 ENCOUNTER — Ambulatory Visit (HOSPITAL_COMMUNITY): Payer: Medicare Other | Admitting: Licensed Clinical Social Worker

## 2020-03-08 ENCOUNTER — Ambulatory Visit (HOSPITAL_COMMUNITY): Payer: Medicare Other

## 2020-03-14 ENCOUNTER — Ambulatory Visit (HOSPITAL_COMMUNITY): Payer: Medicare Other | Admitting: Licensed Clinical Social Worker

## 2020-05-19 DIAGNOSIS — F600 Paranoid personality disorder: Secondary | ICD-10-CM | POA: Insufficient documentation

## 2020-05-20 DIAGNOSIS — Z5181 Encounter for therapeutic drug level monitoring: Secondary | ICD-10-CM | POA: Insufficient documentation

## 2020-06-19 DIAGNOSIS — F1011 Alcohol abuse, in remission: Secondary | ICD-10-CM | POA: Insufficient documentation

## 2020-06-19 DIAGNOSIS — I6930 Unspecified sequelae of cerebral infarction: Secondary | ICD-10-CM | POA: Insufficient documentation

## 2020-07-10 DIAGNOSIS — R7989 Other specified abnormal findings of blood chemistry: Secondary | ICD-10-CM | POA: Insufficient documentation

## 2020-07-10 DIAGNOSIS — E559 Vitamin D deficiency, unspecified: Secondary | ICD-10-CM | POA: Insufficient documentation

## 2021-01-27 ENCOUNTER — Other Ambulatory Visit (HOSPITAL_COMMUNITY): Payer: Self-pay | Admitting: Medical

## 2021-08-14 ENCOUNTER — Ambulatory Visit (HOSPITAL_COMMUNITY)
Admission: EM | Admit: 2021-08-14 | Discharge: 2021-08-14 | Disposition: A | Discharge status: Home / Self Care | Payer: Medicare Other | Attending: Psychiatry | Admitting: Psychiatry

## 2021-08-14 DIAGNOSIS — F209 Schizophrenia, unspecified: Secondary | ICD-10-CM | POA: Insufficient documentation

## 2021-08-14 DIAGNOSIS — Z8659 Personal history of other mental and behavioral disorders: Secondary | ICD-10-CM | POA: Insufficient documentation

## 2021-08-14 DIAGNOSIS — I6381 Other cerebral infarction due to occlusion or stenosis of small artery: Secondary | ICD-10-CM | POA: Insufficient documentation

## 2021-08-14 DIAGNOSIS — F319 Bipolar disorder, unspecified: Secondary | ICD-10-CM | POA: Diagnosis not present

## 2021-08-14 DIAGNOSIS — Z87820 Personal history of traumatic brain injury: Secondary | ICD-10-CM | POA: Insufficient documentation

## 2021-08-14 DIAGNOSIS — F22 Delusional disorders: Secondary | ICD-10-CM | POA: Diagnosis present

## 2021-08-14 DIAGNOSIS — F1011 Alcohol abuse, in remission: Secondary | ICD-10-CM | POA: Insufficient documentation

## 2021-08-14 DIAGNOSIS — D332 Benign neoplasm of brain, unspecified: Secondary | ICD-10-CM | POA: Diagnosis not present

## 2021-08-14 DIAGNOSIS — F1911 Other psychoactive substance abuse, in remission: Secondary | ICD-10-CM | POA: Insufficient documentation

## 2021-08-14 DIAGNOSIS — Z79899 Other long term (current) drug therapy: Secondary | ICD-10-CM | POA: Diagnosis present

## 2021-08-14 NOTE — Discharge Instructions (Addendum)
Follow up with outpatient medication management and counseling. ? ?Patient is instructed prior to discharge to: ?Take all medications as prescribed by his/her mental healthcare provider. ?Report any adverse effects and or reactions from the medicines to his/her outpatient provider promptly. ?Keep all scheduled appointments, to ensure that you are getting refills on time and to avoid any interruption in your medication.  If you are unable to keep an appointment call to reschedule.  Be sure to follow-up with resources and follow-up appointments provided.  ?Patient has been instructed & cautioned: To not engage in alcohol and or illegal drug use while on prescription medicines. ?In the event of worsening symptoms, patient is instructed to call the crisis hotline, 911 and or go to the nearest ED for appropriate evaluation and treatment of symptoms. ?To follow-up with his/her primary care provider for your other medical issues, concerns and or health care needs. ?

## 2021-08-14 NOTE — ED Triage Notes (Signed)
Pt presents to [facility] voluntarily accompanied by his mother with a complaint of worsening paranoia. Pt states that he has been experiencing paranoia more than ever lately. Pt states that he has a brain tumor and had a CT scan 2 weeks ago at [place] but he feels like he needs another one. Pt states "I think I overdosed on water". Pt states that he has been paranoid because he has to protect his medications because they are a controlled substance and he doesn't want them to get into the wrong hands. Pt jumped up during triage process and stated " I need my backpack" and walked out of the room, went outside to the car and got his backpack. Pt entered back into the lobby and stated that his medications are in his backpack thats why he needed to go get them because they need to be in his posession at all times. Pts mother states that the pt is diagnosed with schizophrenia. Pt denies SI/HI and AVH. ?
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
## 2021-08-18 ENCOUNTER — Telehealth (HOSPITAL_COMMUNITY): Payer: Self-pay | Admitting: Medical

## 2021-08-18 DIAGNOSIS — Z87820 Personal history of traumatic brain injury: Secondary | ICD-10-CM | POA: Insufficient documentation

## 2021-08-18 NOTE — Telephone Encounter (Signed)
Phone calls from pt on private line ?On Saturday he admitted to not taking his medications as prescribed. I suggestedv he start his meds as prescribed and meet with me this Thursday set up a My Char ? ?On 4/20 he called his mother stating he believed he was going to be killed and she brought him to [facility] (See encounter) ? ?He called again this pm wanting to know if his risperdal could be replaced stating medication nurse at his Suboxone clinic had suggested Abilify ?He has never been Gene tested ?Agreed to see him in person g fo Gene test Thursday at 3 ?

## 2021-08-20 DIAGNOSIS — D332 Benign neoplasm of brain, unspecified: Secondary | ICD-10-CM | POA: Insufficient documentation

## 2021-08-21 ENCOUNTER — Ambulatory Visit (HOSPITAL_COMMUNITY): Payer: Medicare Other | Admitting: Medical

## 2021-08-21 ENCOUNTER — Encounter (HOSPITAL_COMMUNITY): Payer: Self-pay | Admitting: Medical

## 2021-08-21 DIAGNOSIS — F600 Paranoid personality disorder: Secondary | ICD-10-CM

## 2021-08-21 DIAGNOSIS — C719 Malignant neoplasm of brain, unspecified: Secondary | ICD-10-CM

## 2021-08-21 DIAGNOSIS — F902 Attention-deficit hyperactivity disorder, combined type: Secondary | ICD-10-CM

## 2021-08-21 DIAGNOSIS — G4710 Hypersomnia, unspecified: Secondary | ICD-10-CM

## 2021-08-21 DIAGNOSIS — Z8659 Personal history of other mental and behavioral disorders: Secondary | ICD-10-CM

## 2021-08-21 DIAGNOSIS — F1321 Sedative, hypnotic or anxiolytic dependence, in remission: Secondary | ICD-10-CM

## 2021-08-21 DIAGNOSIS — F411 Generalized anxiety disorder: Secondary | ICD-10-CM

## 2021-08-21 DIAGNOSIS — D330 Benign neoplasm of brain, supratentorial: Secondary | ICD-10-CM

## 2021-08-21 DIAGNOSIS — F39 Unspecified mood [affective] disorder: Secondary | ICD-10-CM

## 2021-08-21 DIAGNOSIS — Z923 Personal history of irradiation: Secondary | ICD-10-CM

## 2021-08-21 DIAGNOSIS — R413 Other amnesia: Secondary | ICD-10-CM

## 2021-08-21 DIAGNOSIS — F1021 Alcohol dependence, in remission: Secondary | ICD-10-CM

## 2021-08-21 DIAGNOSIS — F1121 Opioid dependence, in remission: Secondary | ICD-10-CM

## 2021-08-21 DIAGNOSIS — I6381 Other cerebral infarction due to occlusion or stenosis of small artery: Secondary | ICD-10-CM
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
## 2021-08-28 ENCOUNTER — Encounter (HOSPITAL_COMMUNITY): Payer: Self-pay | Admitting: Medical

## 2021-08-28 ENCOUNTER — Ambulatory Visit (HOSPITAL_BASED_OUTPATIENT_CLINIC_OR_DEPARTMENT_OTHER): Payer: Medicare Other | Admitting: Medical

## 2021-08-28 DIAGNOSIS — I6381 Other cerebral infarction due to occlusion or stenosis of small artery: Secondary | ICD-10-CM

## 2021-08-28 DIAGNOSIS — Z923 Personal history of irradiation: Secondary | ICD-10-CM

## 2021-08-28 DIAGNOSIS — G4710 Hypersomnia, unspecified: Secondary | ICD-10-CM

## 2021-08-28 DIAGNOSIS — F1021 Alcohol dependence, in remission: Secondary | ICD-10-CM | POA: Diagnosis not present

## 2021-08-28 DIAGNOSIS — F1321 Sedative, hypnotic or anxiolytic dependence, in remission: Secondary | ICD-10-CM

## 2021-08-28 DIAGNOSIS — F1121 Opioid dependence, in remission: Secondary | ICD-10-CM

## 2021-08-28 DIAGNOSIS — Z8659 Personal history of other mental and behavioral disorders: Secondary | ICD-10-CM

## 2021-08-28 DIAGNOSIS — R413 Other amnesia: Secondary | ICD-10-CM

## 2021-08-28 DIAGNOSIS — F902 Attention-deficit hyperactivity disorder, combined type: Secondary | ICD-10-CM

## 2021-08-28 DIAGNOSIS — F600 Paranoid personality disorder: Secondary | ICD-10-CM | POA: Diagnosis not present

## 2021-08-28 DIAGNOSIS — D330 Benign neoplasm of brain, supratentorial: Secondary | ICD-10-CM

## 2021-08-28 DIAGNOSIS — F411 Generalized anxiety disorder: Secondary | ICD-10-CM

## 2021-08-28 DIAGNOSIS — F39 Unspecified mood [affective] disorder: Secondary | ICD-10-CM

## 2021-08-28 MED ORDER — ARIPIPRAZOLE 10 MG PO TABS: 20.0000 mg | ORAL_TABLET | Freq: Every day | ORAL | 0 refills | Status: DC

## 2021-08-28 NOTE — Progress Notes (Signed)
BH MD/PA/NP OP Progress Note ? ?08/28/2021 4:28 PM ?[name]  ?MRN:  [id number] ? ?Chief Complaint:  ?Chief Complaint  ?Patient presents with  ? Follow-up  ? Medication Management  ? Paranoid  ? Addiction Problem  ? Hallucinations  ? ?HPI: Pt returns for 1 week FU to review [employer] results and fu on medication effect on his paranoia/voices. Unfortunately [employer] failed to pick up test until 2 days ago so results are delayed. Mother is again with him and both report voices are still present but no longer threatening to him.He meets with NP on Monday.Wants [place] to talk with her and will sign release ?Mom concerned about Modanafil use but he denies abuse and finds it helps his ADHD better and withn less side effect thamn amphetamine. Denies taking for feeling.Agrees to monitor for this. ?Visit Diagnosis:  ?  ICD-10-CM   ?1. 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  ?2. 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  ?3. Alcohol use disorder, severe, in sustained remission ([place])  F10.21   ?  ?4. Sedative, hypnotic or anxiolytic use disorder, moderate, in sustained remission (HCC)  F13.21   ?  ?5. 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  ?6. S/P radiation therapy  Z92.3   ?  ?7. Multiple lacunar infarcts (HCC)  I63.81   ?  ?8. Memory dysfunction  R41.3   ?  ?9. Unspecified mood (affective) disorder (HCC)  F39   ?  ?10. Generalized anxiety disorder  F41.1   ?  ?11. Attention deficit hyperactivity disorder (ADHD), combined type  F90.2   ?  ?12. Persistent hypersomnia  G47.10   ?  ?13. History of ADHD  Z86.59   ?  ? ? ?Past Psychiatric History:  ?NOVANT ?Alcohol intoxication 03/26/2017  ?Alcohol withdrawal delirium 03/26/2017  ?Chronic daily headache 02/25/2017  ?Alcohol use disorder, severe, dependence 02/22/2017  ?Heroin dependence MAT Methadone Metro/Subutex TBR 01/2018  ?Anxiety disorder, unspecified 01/20/2017  ?Paranoia 03/2018  ?  ?[facility] ?Unk Pinto, NP - 04/12/2017 12:38 PM EST ?Formatting of this note might be different from the original. ?[facility] - Substance Abuse PHP/IOP Intake Assessment  ?[place] Formulate an individual and appropriate relapse prevention plan.   BH SU Problem: Formation of a relapse prevention plan  ?  ?[facility] ?Uncomplicated opioid dependence (CMS/HCC) 12/24/2017  ?1. Discharge from hospital. ?2. Follow up at [facility]. ?   ?          ?          [facility] ?           12/15/2007 -05/21/2010 ? ?Past Medical History:  ?Past Medical History:  ?Diagnosis Date  ? ADHD   ? Anxiety   ? Brain tumor ([place])   ? balance and occ memory issues and headaches  ? Brain tumor ([place])   ? [facility]  ? Depression   ? Headache   ? migraine and tension  ? Hypothyroidism   ? Soft tissue mass   ? left shoulder  ? Substance abuse ([place])   ?  ?Past Surgical History:  ?Procedure Laterality Date  ? colonscopy    ? gamma knife radiation 2018 for brain tumor'    ? hematoma removed from arm Left   ? MASS EXCISION Left 06/30/2019  ? Procedure: EXCISION OF SOFT TISSUE  MASS LEFT SHOULDER;  Surgeon: Armandina Gemma, MD;  Location: [facility];  Service: General;  Laterality: Left;  LMA  ? ? ?Family Psychiatric History:  ? ?Family History:  ?[facility] ? Hypertension Mother  ? Hypertension Father   ?Social History:  ?Social History  ? ?Socioeconomic History  ? Marital status: Single  ?  Spouse name: Not on file  ? Number of children: Not on file  ? Years of education: Not on file  ? Highest education level: Not on file  ?Occupational History  ? Not on file  ?Tobacco Use  ? Smoking status: Every Day  ?  Types: Cigarettes  ? Smokeless tobacco: Never  ? Tobacco comments:  ?  quit jan 2020  ?Vaping Use  ? Vaping Use: Never used  ?Substance and Sexual Activity  ? Alcohol use: Not Currently  ?  Comment: quit oct 2019  ? Drug use: Yes  ?  Types: Cocaine, IV   ?  Comment: last used cocaine 2019 per pt  ? Sexual activity: Not on file  ?Other Topics Concern  ? Not on file  ?Social History Narrative  ? Not on file  ? ?Social Determinants of Health  ? ?Financial Resource Strain: Disability  ?Food Insecurity: No  ?Transportation Needs: Mother providing at present  ?Physical Activity: No isolates  ?Stress: Auditory hallucvinations/Paranoia  ?Social Connections:Mother/Suboxone clinic   ? ? ?Allergies: No Known Allergies ? ?Metabolic Disorder Labs: ?No results found for: HGBA1C, MPG ?No results found for: PROLACTIN ?No results found for: CHOL, TRIG, HDL, CHOLHDL, VLDL, LDLCALC ?Lab Results  ?Component Value Date  ? TSH 2.166 01/09/2018  ? TSH 1.459 08/03/2017  ? ? ?Therapeutic Level Labs:NA ? ?Current Medications: ?Current Outpatient Medications  ?Medication Sig Dispense Refill  ? acetaminophen (TYLENOL) 500 MG tablet Take 2,000 mg by mouth 2 (two) times daily as needed for headache (pain).     ? ARIPiprazole (ABILIFY) 10 MG tablet Take 2 tablets (20 mg total) by mouth daily. 60 tablet 0  ? buprenorphine (SUBUTEX) 8 MG SUBL SL tablet Place 8 mg under the tongue 3 (three) times daily.    ? buprenorphine (SUBUTEX) 8 MG SUBL SL tablet Place under the tongue.    ? busPIRone (BUSPAR) 10 MG tablet     ? cephALEXin (KEFLEX) 500 MG capsule Take 500 mg by mouth 2 (two) times daily.    ? clindamycin (CLEOCIN T) 1 % SWAB Apply 1 each topically 2 (two) times daily.    ? Crisaborole (EUCRISA) 2 % OINT Apply topically.    ? doxycycline (VIBRAMYCIN) 50 MG capsule     ? ergocalciferol (VITAMIN D2) 1.25 MG (50000 UT) capsule Take by mouth.    ? Fluocinolone Acetonide 0.01 % OIL Apply 3 to 5 drops to affected area of ears once to twice daily as needed.    ? gabapentin (NEURONTIN) 300 MG capsule Take 300 mg by mouth 2 (two) times daily as needed.    ? gabapentin (NEURONTIN) 300 MG capsule Take by mouth.    ? hydrocortisone 2.5 % lotion Apply topically.    ? hydrOXYzine (ATARAX/VISTARIL) 10 MG  tablet Take 10 mg by mouth as needed.    ? hydrOXYzine (VISTARIL) 50 MG capsule Take 50 mg by mouth 3 (three) times daily.    ? levothyroxine (SYNTHROID) 112 MCG tablet Take 1 tablet by mouth daily.    ? meloxicam (MOBIC) 15 MG tablet     ? metroNIDAZOLE (METROGEL) 0.75 % gel Apply 1 application  topically at bedtime.    ? modafinil (PROVIGIL) 200 MG tablet Take 200 mg by mouth 2 (two) times daily.    ? propranolol (INDERAL) 20 MG tablet Take 1 tablet by mouth 3 (three) times daily as needed.    ? tamsulosin (FLOMAX) 0.4 MG CAPS capsule Take by mouth.    ? topiramate (TOPAMAX) 100 MG tablet Take 1 tablet by mouth at bedtime.    ? ?No current facility-administered medications for this visit.  ? ? ? ?Musculoskeletal: ?Strength & Muscle Tone: within normal limits ?Gait & Station: normal ?Patient leans: N/A ? ?Psychiatric Specialty Exam: ?Review of Systems ?Constitutional:  Positive for improved activity change, appetite change and fatigue. Negative for chills, diaphoresis, fever and unexpected weight change.  ?HENT:  Negative for congestion, dental problem, ear discharge, ear pain, facial swelling, hearing loss, mouth sores, nosebleeds, postnasal drip, rhinorrhea, sinus pressure, sinus pain, sneezing, sore throat, tinnitus, trouble swallowing and voice change.   ?Eyes:  Negative for photophobia, pain, discharge, redness, itching and visual disturbance.  ?Respiratory:  Negative for apnea, cough, choking, chest tightness, shortness of breath, wheezing and stridor.   ?Cardiovascular:  Negative for chest pain, palpitations and leg swelling.  ?Gastrointestinal:  Negative for abdominal distention, abdominal pain, anal bleeding, blood in stool, constipation, diarrhea, nausea, rectal pain and vomiting.  ?Endocrine: Negative for cold intolerance, heat intolerance, polydipsia, polyphagia and polyuria.  ?Genitourinary:  Negative for decreased urine volume, difficulty urinating, dysuria, enuresis, flank pain, frequency, genital  sores, hematuria, penile discharge, penile pain, penile swelling, scrotal swelling, testicular pain and urgency.  ?Musculoskeletal:  Positive for arthralgias. Negative for back pain, gait problem, joint swell

## 2021-09-04 ENCOUNTER — Other Ambulatory Visit (HOSPITAL_COMMUNITY): Payer: Self-pay | Admitting: *Deleted

## 2021-09-04 ENCOUNTER — Ambulatory Visit (HOSPITAL_BASED_OUTPATIENT_CLINIC_OR_DEPARTMENT_OTHER): Payer: Medicare Other | Admitting: Medical

## 2021-09-04 ENCOUNTER — Encounter (HOSPITAL_COMMUNITY): Payer: Self-pay | Admitting: Medical

## 2021-09-04 DIAGNOSIS — F1121 Opioid dependence, in remission: Secondary | ICD-10-CM

## 2021-09-04 DIAGNOSIS — Z923 Personal history of irradiation: Secondary | ICD-10-CM

## 2021-09-04 DIAGNOSIS — F1021 Alcohol dependence, in remission: Secondary | ICD-10-CM | POA: Diagnosis not present

## 2021-09-04 DIAGNOSIS — F600 Paranoid personality disorder: Secondary | ICD-10-CM | POA: Diagnosis not present

## 2021-09-04 DIAGNOSIS — D330 Benign neoplasm of brain, supratentorial: Secondary | ICD-10-CM

## 2021-09-04 DIAGNOSIS — R413 Other amnesia: Secondary | ICD-10-CM

## 2021-09-04 DIAGNOSIS — G250 Essential tremor: Secondary | ICD-10-CM

## 2021-09-04 DIAGNOSIS — I6381 Other cerebral infarction due to occlusion or stenosis of small artery: Secondary | ICD-10-CM

## 2021-09-04 DIAGNOSIS — F39 Unspecified mood [affective] disorder: Secondary | ICD-10-CM

## 2021-09-04 DIAGNOSIS — F1321 Sedative, hypnotic or anxiolytic dependence, in remission: Secondary | ICD-10-CM

## 2021-09-04 NOTE — Progress Notes (Addendum)
BH MD/PA/NP OP Progress Note ? ?4:53 PM ?09/04/2021 ?[name]  ?MRN:  [id number] ? ?Chief Complaint:  ?Chief Complaint  ?Patient presents with  ? Follow-up  ? Addiction Problem  ? Paranoid  ? Brain tumor sw/p gamma knife  ? MAT Suboxone  ? ?HPI: Paul Bray returns for 1 week FU on efficacy of Abilify and to [employer] study. Unfortunately [employer] has failed to perform the requested test Paul Bray is again accompanied by his mother. He has 2 rolling cases with him that contain his valuables and records. He cvontinues to believe that the building he lives in is unsafe and that 2 units are trying to steal his identity. He says he is not hallucinating -he can hear the tenants next door talking to him thru the walls.He questions if Paul Bray can steal his genetic identity? ?He also believes his brain tumor has grown (there was a slight increase on an unenhanced CT April 4 . There was an unenhanced MRI in September (Paul Bray reports they were unable to access a vein for contrast. (This reports a "minimally increased mass).  ?Atone point he gets quite angry at his mother whom he perceived to be laughing at him and started yelling at her. He immediately responded to request to stop/lower his voice. And remained calm for remainder of visit. ?  ?Visit Diagnosis:  ?  ICD-10-CM   ?1. 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  ?2. 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  ?3. Alcohol use disorder, severe, in sustained remission ([place])  F10.21   ?  ?4. Sedative, hypnotic or anxiolytic use disorder, moderate, in sustained remission (HCC)  F13.21   ?  ?5. 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  ?6. S/P radiation therapy  Z92.3   ?  ?7. Multiple lacunar infarcts (HCC)  I63.81   ?  ?8. Memory dysfunction  R41.3   ?  ?9. Familial tremor  G25.0   ?  ? ? ?Past Psychiatric History:  ?NOVANT ?Alcohol intoxication 03/26/2017  ?Alcohol withdrawal delirium 03/26/2017   ?Chronic daily headache 02/25/2017  ?Alcohol use disorder, severe, dependence 02/22/2017  ?Heroin dependence MAT Methadone Metro/Subutex TBR 01/2018  ?Anxiety disorder, unspecified 01/20/2017  ?Paranoia 03/2018  ?  ?[facility] ?Unk Pinto, NP - 04/12/2017 12:38 PM EST ?Formatting of this note might be different from the original. ?[facility] - Substance Abuse PHP/IOP Intake Assessment  ?[place] Formulate an individual and appropriate relapse prevention plan.   BH SU Problem: Formation of a relapse prevention plan  ?  ?[facility] ?Uncomplicated opioid dependence (CMS/HCC) 12/24/2017  ?1. Discharge from hospital. ?2. [facility] up at [facility]. ?   ?          ?          [facility] ?           12/15/2007 -05/21/2010 records requested ? ?Past Medical History:  ?Past Medical History:  ?Diagnosis Date  ? ADHD   ? Anxiety   ? Brain tumor ([place])   ? balance and occ memory issues and headaches  ? Brain tumor ([place])   ? [facility]  ? Depression   ? Headache   ? migraine and tension  ? Hypothyroidism   ? Soft tissue mass   ? left shoulder  ? Substance abuse ([place])   ? MRI BRAIN WITH  AND WITHOUT CONTRAST, 01/10/2021  ?FINDINGS:  ? ?Calvarium/skull base: No focal marrow replacing lesion.  ? ?Orbits: No mass lesion.  ? ?Paranasal sinuses: No air-fluid levels.  ? ?Brain: Similar to minimally increased size of an enhancing right lateral intraventricular mass measuring approximately 1.9 x 1.2 cm, previously approximately 1.6 x 1.1 cm compared to the MRI from 2020. Areas of intralesional susceptibility artifact are similar to 2020, which likely reflect post-treatment changes. Similar size of the ventricular system without evidence for hydrocephalus. Remote left basal ganglia lacunar infarct ganglia. No restricted diffusion. No substantial intracranial mass effect. No evidence of acute hemorrhage. No significant white matter disease for the  patient's age. Normal appearance of the major intracranial arteries and dural venous sinuses. ?Past Surgical History:  ?Procedure Laterality Date  ? colonscopy    ? gamma knife radiation 2018 for brain tumor'    ? hematoma removed from arm Left   ? MASS EXCISION Left 06/30/2019  ? Procedure: EXCISION OF SOFT TISSUE  MASS LEFT SHOULDER;  Surgeon: Armandina Gemma, MD;  Location: [place];  Service: General;  Laterality: Left;  LMA  ? ? ?Family Psychiatric History:  ?Alcoholism and drug abuse on father's side ? ?Family History:  ?[facility] ? Hypertension Mother  ? Hypertension Father   ?Social History:  ?Social History  ?  ?     ?Socioeconomic History  ? Marital status: Single  ?    Spouse name: Not on file  ? Number of children: Not on file  ? Years of education: Not on file  ? Highest education level: Not on file  ?Occupational History  ? Not on file  ?Tobacco Use  ? Smoking status: Every Day  ?    Types: Cigarettes  ? Smokeless tobacco: Never  ? Tobacco comments:  ?    quit jan 2020  ?Vaping Use  ? Vaping Use: Never used  ?Substance and Sexual Activity  ? Alcohol use: Not Currently  ?    Comment: quit oct 2019  ? Drug use: Yes  ?    Types: Cocaine, IV  ?    Comment: last used cocaine 2019 per pt  ? Sexual activity: Not on file  ?Other Topics Concern  ? Not on file  ?Social History Narrative  ? Not on file  ?  ?Social Determinants of Health  ?  ?Financial Resource Strain: Disability  ?Food Insecurity: No  ?Transportation Needs: Mother providing at present  ?Physical Activity: No isolates  ?Stress: Auditory hallucvinations/Paranoia  ?Social Connections:Mother/Suboxone clinic   ? ? ?Allergies:  ?Allergies  ?Allergen Reactions  ? Amphetamine-Dextroamphetamine Other (See Comments)  ? ? ?Metabolic Disorder Labs: ?No results found for: HGBA1C, MPG ?No results found for: PROLACTIN ?No results found for: CHOL, TRIG, HDL, CHOLHDL, VLDL, LDLCALC ?Lab Results  ?Component Value Date  ? TSH 2.166 01/09/2018  ? TSH 1.459  08/03/2017  ? ? ?Therapeutic Level Labs: ?No results found for: LITHIUM ?No results found for: VALPROATE ?No components found for:  CBMZ ? ?Current Medications: ?Current Outpatient Medications  ?Medication Sig Dispense Refill  ? tamsulosin (FLOMAX) 0.4 MG CAPS capsule Take by mouth.    ? acetaminophen (TYLENOL) 500 MG tablet Take 2,000 mg by mouth 2 (two) times daily as needed for headache (pain).     ? ARIPiprazole (ABILIFY) 20 MG tablet Take 20 mg by mouth at bedtime as needed.    ? buprenorphine (SUBUTEX) 8 MG SUBL SL tablet Place 8 mg under the tongue 3 (three) times daily.    ?  buprenorphine (SUBUTEX) 8 MG SUBL SL tablet Place under the tongue.    ? busPIRone (BUSPAR) 10 MG tablet     ? cephALEXin (KEFLEX) 500 MG capsule Take 500 mg by mouth 2 (two) times daily.    ? clindamycin (CLEOCIN T) 1 % SWAB Apply 1 each topically 2 (two) times daily.    ? Crisaborole (EUCRISA) 2 % OINT Apply topically.    ? doxycycline (VIBRAMYCIN) 50 MG capsule     ? ergocalciferol (VITAMIN D2) 1.25 MG (50000 UT) capsule Take by mouth.    ? Fluocinolone Acetonide 0.01 % OIL Apply 3 to 5 drops to affected area of ears once to twice daily as needed.    ? gabapentin (NEURONTIN) 300 MG capsule Take 300 mg by mouth 2 (two) times daily as needed.    ? gabapentin (NEURONTIN) 300 MG capsule Take by mouth.    ? hydrocortisone 2.5 % lotion Apply topically.    ? hydrOXYzine (ATARAX/VISTARIL) 10 MG tablet Take 10 mg by mouth as needed.    ? hydrOXYzine (VISTARIL) 50 MG capsule Take 50 mg by mouth 3 (three) times daily.    ? levothyroxine (SYNTHROID) 112 MCG tablet Take 1 tablet by mouth daily.    ? meloxicam (MOBIC) 15 MG tablet     ? metroNIDAZOLE (METROGEL) 0.75 % gel Apply 1 application topically at bedtime.    ? modafinil (PROVIGIL) 200 MG tablet Take 200 mg by mouth 2 (two) times daily.    ? propranolol (INDERAL) 20 MG tablet Take 1 tablet by mouth 3 (three) times daily as needed.    ? topiramate (TOPAMAX) 100 MG tablet Take 1 tablet by  mouth at bedtime.    ? ?No current facility-administered medications for this visit.  ? ? ? ?Musculoskeletal: ?Strength & Muscle Tone: within normal limits ?Gait & Station: normal ?Patient leans: Front ? ?Psy

## 2021-09-11 ENCOUNTER — Encounter (HOSPITAL_COMMUNITY): Payer: Self-pay | Admitting: Medical

## 2021-09-11 ENCOUNTER — Telehealth (HOSPITAL_COMMUNITY): Payer: Self-pay | Admitting: Medical

## 2021-09-11 ENCOUNTER — Ambulatory Visit (HOSPITAL_BASED_OUTPATIENT_CLINIC_OR_DEPARTMENT_OTHER): Payer: Medicare Other | Admitting: Medical

## 2021-09-11 DIAGNOSIS — F1121 Opioid dependence, in remission: Secondary | ICD-10-CM

## 2021-09-11 DIAGNOSIS — Z923 Personal history of irradiation: Secondary | ICD-10-CM

## 2021-09-11 DIAGNOSIS — D330 Benign neoplasm of brain, supratentorial: Secondary | ICD-10-CM | POA: Diagnosis not present

## 2021-09-11 DIAGNOSIS — F22 Delusional disorders: Secondary | ICD-10-CM | POA: Diagnosis not present

## 2021-09-11 DIAGNOSIS — F39 Unspecified mood [affective] disorder: Secondary | ICD-10-CM

## 2021-09-11 DIAGNOSIS — F1321 Sedative, hypnotic or anxiolytic dependence, in remission: Secondary | ICD-10-CM

## 2021-09-11 DIAGNOSIS — I639 Cerebral infarction, unspecified: Secondary | ICD-10-CM | POA: Diagnosis not present

## 2021-09-11 DIAGNOSIS — Z8659 Personal history of other mental and behavioral disorders: Secondary | ICD-10-CM

## 2021-09-11 DIAGNOSIS — I6381 Other cerebral infarction due to occlusion or stenosis of small artery: Secondary | ICD-10-CM

## 2021-09-11 DIAGNOSIS — R413 Other amnesia: Secondary | ICD-10-CM

## 2021-09-11 DIAGNOSIS — F1021 Alcohol dependence, in remission: Secondary | ICD-10-CM

## 2021-09-11 DIAGNOSIS — C719 Malignant neoplasm of brain, unspecified: Secondary | ICD-10-CM

## 2021-09-11 DIAGNOSIS — E039 Hypothyroidism, unspecified: Secondary | ICD-10-CM

## 2021-09-11 DIAGNOSIS — G250 Essential tremor: Secondary | ICD-10-CM

## 2021-09-11 DIAGNOSIS — F428 Other obsessive-compulsive disorder: Secondary | ICD-10-CM

## 2021-09-11 DIAGNOSIS — G4710 Hypersomnia, unspecified: Secondary | ICD-10-CM

## 2021-09-11 NOTE — Progress Notes (Unsigned)
BH MD/PA/NP OP Progress Note  09/11/2021 4:46 PM Paul Bray  MRN:  [id number]  Chief Complaint:  Chief Complaint  Patient presents with   Follow-up   Brain Tumor   Delusional disorder   Addiction Problem   Lacunar infarcts (Lt Basal Ganglia)   Family Problem   HPI: Paul Bray returns with Mom for scheduled FU to monitor response to medication change for his ongoing delusional disorder in the background of Lt Basal Ganglia infarcts (Lacunar) and Rt lateral intraventricular mass (Meningioma vs Astrocytoma) S/P Gamma knife radiation. He    Visit Diagnosis:    ICD-10-CM   1. Delusional disorder, persecutory type, multiple episodes, currently in acute episode ([place])  F22     2. Multiple lacunar infarcts (HCC)  I63.81    Lt basal ganglia    3. Infarction of left basal ganglia (HCC)  I63.9     4. 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 meningioma vs astrocytoma    5. 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 Status post gamma knife treatment  Z92.3     7. Unspecified mood (affective) disorder (HCC)  F39     8. Obsessional thoughts  F42.8     9. Severe opioid dependence in sustained remission on maintenance therapy (HCC)  F11.21     10. Sedative, hypnotic or anxiolytic use disorder, moderate, in sustained remission (HCC)  F13.21     11. Alcohol use disorder, severe, in sustained remission ([place])  F10.21     12. Memory dysfunction  R41.3     13. Familial tremor  G25.0     14. Persistent hypersomnia  G47.10     15. History of ADHD  Z86.59     16. Hypothyroidism, unspecified type  E03.9       Past Psychiatric History:    Past Psychiatric History:  NOVANT Alcohol intoxication 03/26/2017  Alcohol withdrawal delirium 03/26/2017  Chronic daily headache 02/25/2017  Alcohol use disorder, severe, dependence 02/22/2017  Heroin dependence MAT Methadone Metro/Subutex TBR 01/2018  Anxiety disorder, unspecified 01/20/2017  Paranoia 03/2018     Bray, Paul O, NP - 04/12/2017 12:38 PM EST Formatting of this note might be different from the original. [facility] Psychiatry - Substance Abuse PHP/IOP Intake Assessment  BH Formulate an individual and appropriate relapse prevention plan.   [place] SU Problem: Formation of a relapse prevention plan    [facility] Uncomplicated opioid dependence (CMS/HCC) 12/24/2017  1. Discharge from hospital. 2. Follow up at [facility].                        [facility]            12/15/2007 -05/21/2010 records re  Past Medical History:  Past Medical History:  Diagnosis Date   ADHD    Anxiety    Brain tumor ([place])    balance and occ memory issues and headaches   Brain tumor ([place])    sees [facility] q year   Depression    Headache    migraine and tension   Hypothyroidism    Soft tissue mass    left shoulder   Substance abuse ([place])     Past Surgical History:  Procedure Laterality Date   colonscopy     gamma knife radiation 2018 for brain tumor'     hematoma removed from arm Left  MASS EXCISION Left 06/30/2019   Procedure: EXCISION OF SOFT TISSUE  MASS LEFT SHOULDER;  Surgeon: Armandina Gemma, MD;  Location: [place];  Service: General;  Laterality: Left;  LMA    Family Psychiatric History:  Alcoholism and drug abuse on father's side   Family History:  Family History [facility] Relation Name Status Comments  Brother   Social worker    Father   Alive    Maternal Grandfather   Deceased    Maternal Grandmother   Deceased    Mother   Alive    Paternal Grandfather   Deceased    Paternal Grandmother   Deceased    Sister   Alive       Social History:  Social History    Marital status: Single      Spouse name: NA   Number of children: NA   Years of education: 75   Highest education level:    Occupational History   Not on file  Tobacco Use   Smoking status: Current  Every Day Smoker      Types: Cigarettes   Smokeless tobacco: Never Used   Tobacco comment: quit Jan 2020  Vaping Use   Vaping Use: Never used  Substance and Sexual Activity   Alcohol use: Not Currently      Comment: quit oct 2019   Drug use: Yes      Types: Cocaine,Heroin  IV      Comment: last used OCT  2019 per pt Started MAT Buprenorphine   Sexual activity: Not on file  Other Topics Concern      Social History Narrative   Emergency planning/management officer Strain:    Difficulty of Paying Living Expenses: Disability  Food Insecurity:    Worried About Charity fundraiser in the Last Year: On disability   Arboriculturist in the Last Year:   Transportation Needs:    Film/video editor (Medical): Uses Ecologist (Non-Medical):   Physical Activity:    Days of Exercise per Week:    Minutes of Exercise per Session:   Stress:    Feeling of Stress : Constant   Social Connections:    Frequency of Communication with Friends and Family: Mother recently kicked him out of house   Frequency of Social Gatherings with Friends and Family: Lives alone-   Attends Religious Services: No   Active Member of Clubs or Organizations: Not active with AA /NA   Attends Archivist Meetings:         Allergies:  Allergies  Allergen Reactions   Naloxone Palpitations    Pt report MAT Rx is Subutex/Buprenorphine only   Amphetamine-Dextroamphetamine Other (See Comments)    Metabolic Disorder Labs:  results found for: HGBA1C, MPG  [facility] 10/02/2020 HEMOGLOBIN A1C <5.7 % 5.3  Normal:       Less than 5.7%  Prediabetes:  5.7% to 6.4%  Diabetes:     Greater than 6.4%  eAG MG/DL 105    eAG Comment                 08/17/2302/07/2306/15/2206/11/2202/24/2201/06/18  Glucose99 106 High  80 101 High  97 76   No results found for: PROLACTIN   results found for: CHOL, TRIG, HDL, CHOLHDL, VLDL, LDLCALC Lipid Profile 10/02/20 Specimen:  Blood  Ref Range & Units 11 mo ago Comments   Total Cholesterol 25 - 199 MG/DL 185  Triglycerides 10 - 150 MG/DL 63    HDL Cholesterol 35 - 135 MG/DL 62    LDL Cholesterol Calculated 0 - 99 MG/DL 110 High     Total Chol / HDL Cholesterol <4.5 3.0    Non-HDL Cholesterol MG/DL 123    Thyroid Lab Results  Component Value Date   TSH 2.166 01/09/2018   TSH TSH09/22/18 TSH10/31/18 0.450 - 5.330 UIU/ML 0.45 - 4.50 mcIU/mL    TSH11/21/18  0.45 - 4.50 mcIU/mL  1.459 4.153   8.20 High     0.76   08/03/2017  Thyroid Pan 10/02/20 14:44el  Specimen:  Blood  Ref Range & Units 11 mo ago  TSH 0.45 - 5.00 UIU/ML 3.80   T4 TOTAL 5.5 - 11.8 mcg/dL 8.2   T3 Uptake 32.0 - 48.4 % 42.6   Free Thyroxine Index 4.8 - 11.3 9.0   Resulting Agency  [facility]     Therapeutic Level Labs:NA  Current Medications: Current Outpatient Medications  Medication Sig Dispense Refill   acetaminophen (TYLENOL) 500 MG tablet Take 2,000 mg by mouth 2 (two) times daily as needed for headache (pain).      ARIPiprazole (ABILIFY) 20 MG tablet Take 20 mg by mouth at bedtime as needed.     buprenorphine (SUBUTEX) 8 MG SUBL SL tablet Place 8 mg under the tongue 3 (three) times daily.     buprenorphine (SUBUTEX) 8 MG SUBL SL tablet Place under the tongue.     busPIRone (BUSPAR) 10 MG tablet      cephALEXin (KEFLEX) 500 MG capsule Take 500 mg by mouth 2 (two) times daily.     clindamycin (CLEOCIN T) 1 % SWAB Apply 1 each topically 2 (two) times daily.     Crisaborole (EUCRISA) 2 % OINT Apply topically.     doxycycline (VIBRAMYCIN) 50 MG capsule      ergocalciferol (VITAMIN D2) 1.25 MG (50000 UT) capsule Take by mouth.     Fluocinolone Acetonide 0.01 % OIL Apply 3 to 5 drops to affected area of ears once to twice daily as needed.     gabapentin (NEURONTIN) 300 MG capsule Take 300 mg by mouth 2 (two) times daily as needed.     gabapentin (NEURONTIN) 300 MG capsule Take by mouth.     hydrocortisone 2.5 % lotion Apply topically.      hydrOXYzine (ATARAX/VISTARIL) 10 MG tablet Take 10 mg by mouth as needed.     hydrOXYzine (VISTARIL) 50 MG capsule Take 50 mg by mouth 3 (three) times daily.     levothyroxine (SYNTHROID) 112 MCG tablet Take 1 tablet by mouth daily.     meloxicam (MOBIC) 15 MG tablet      metroNIDAZOLE (METROGEL) 0.75 % gel Apply 1 application topically at bedtime.     modafinil (PROVIGIL) 200 MG tablet Take 200 mg by mouth 2 (two) times daily.     propranolol (INDERAL) 20 MG tablet Take 1 tablet by mouth 3 (three) times daily as needed.     tamsulosin (FLOMAX) 0.4 MG CAPS capsule Take by mouth.     topiramate (TOPAMAX) 100 MG tablet Take 1 tablet by mouth at bedtime.     No current facility-administered medications for this visit.     Musculoskeletal: Strength & Muscle Tone: within normal limits Gait & Station: normal Patient leans: N/A  Psychiatric Specialty Exam: Review of Systems  There were no vitals taken for this visit.There is no height or weight on file to calculate  BMI.  General Appearance: Casual  Eye Contact:  Good  Speech:  Clear and Coherent  Volume:  Normal  Mood:   Variable  Affect:  Full Range  Thought Process:  Coherent, Goal Directed, and Descriptions of Associations: Loose  Orientation:  Full (Time, Place, and Person)  Thought Content: WDL, Logical, Illogical, Delusions, Hallucinations: Auditory, and Paranoid Ideation   Suicidal Thoughts:  No  Homicidal Thoughts:  No  Memory:   Influenced by psychosis  Judgement:  Impaired  Insight:Varies as with thought content  Psychomotor Activity:  Normal  Concentration:  Concentration: Fair and Attention Span: Fair  Recall:  [employer] of Knowledge:  WDL  Language: Good  Akathisia:  Negative  Handed:  Right  AIMS (if indicated): Tremor of hands (hx of familial tremor) no other invo;untary movements  Assets:  Desire for Improvement Financial Resources/Insurance Housing Resilience Social Support Talents/Skills  ADL's:  Impaired   Cognition: Impaired,  Moderate and Severe  Sleep:  Poor      Assessment : Continues to struggle with psychosis   and Plan: Continue Abilify at 20 mg FU 1 week Keep appts with NP Lia Hopping, PA-C 09/11/2021, 4:46 PM

## 2021-09-18 ENCOUNTER — Ambulatory Visit (HOSPITAL_BASED_OUTPATIENT_CLINIC_OR_DEPARTMENT_OTHER): Payer: Medicare Other | Admitting: Medical

## 2021-09-18 ENCOUNTER — Telehealth (HOSPITAL_COMMUNITY): Payer: Self-pay | Admitting: *Deleted

## 2021-09-18 ENCOUNTER — Encounter (HOSPITAL_COMMUNITY): Payer: Self-pay | Admitting: Medical

## 2021-09-18 VITALS — Ht 71.0 in | Wt 190.0 lb

## 2021-09-18 DIAGNOSIS — F1021 Alcohol dependence, in remission: Secondary | ICD-10-CM

## 2021-09-18 DIAGNOSIS — G4710 Hypersomnia, unspecified: Secondary | ICD-10-CM

## 2021-09-18 DIAGNOSIS — D330 Benign neoplasm of brain, supratentorial: Secondary | ICD-10-CM

## 2021-09-18 DIAGNOSIS — I6381 Other cerebral infarction due to occlusion or stenosis of small artery: Secondary | ICD-10-CM | POA: Diagnosis not present

## 2021-09-18 DIAGNOSIS — I639 Cerebral infarction, unspecified: Secondary | ICD-10-CM | POA: Diagnosis not present

## 2021-09-18 DIAGNOSIS — F22 Delusional disorders: Secondary | ICD-10-CM | POA: Diagnosis not present

## 2021-09-18 DIAGNOSIS — E039 Hypothyroidism, unspecified: Secondary | ICD-10-CM

## 2021-09-18 DIAGNOSIS — Z8659 Personal history of other mental and behavioral disorders: Secondary | ICD-10-CM

## 2021-09-18 DIAGNOSIS — F1121 Opioid dependence, in remission: Secondary | ICD-10-CM

## 2021-09-18 DIAGNOSIS — F1321 Sedative, hypnotic or anxiolytic dependence, in remission: Secondary | ICD-10-CM

## 2021-09-18 DIAGNOSIS — Z923 Personal history of irradiation: Secondary | ICD-10-CM

## 2021-09-18 DIAGNOSIS — G250 Essential tremor: Secondary | ICD-10-CM

## 2021-09-18 DIAGNOSIS — C719 Malignant neoplasm of brain, unspecified: Secondary | ICD-10-CM

## 2021-09-18 MED ORDER — THYROID 90 MG PO TABS: 90.0000 mg | ORAL_TABLET | Freq: Every day | ORAL | 2 refills | Status: DC

## 2021-09-18 MED ORDER — PHENTERMINE HCL 37.5 MG PO CAPS: 37.5000 mg | ORAL_CAPSULE | ORAL | 0 refills | Status: DC

## 2021-09-18 NOTE — Progress Notes (Unsigned)
[place] MD/PA/NP OP Progress Note  09/18/2021 5:01 PM Paul Bray  MRN:  [id number]  Chief Complaint:  Chief Complaint  Patient presents with   Follow-up   Addiction Problem   Agitation   Hallucinations   Brain Tumor   Lt Basal Ganglia infarctions   HPI: 1 week FU. Continues to come with Mom. Reports no real change in hallucinosis/paranoia but looks much better.He has not spoken to NP. I informed him I was able to talk to her and that she wants him to increase his Abilify to 30 mg QD.  Visit Diagnosis:    ICD-10-CM   1. Delusional disorder, persecutory type, multiple episodes, currently in acute episode ([place])  F22     2. Multiple lacunar infarcts (HCC)  I63.81     3. Infarction of left basal ganglia (HCC)  I63.9     4. 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 Status post gamma knife treatment  Z92.3     7. Acquired hypothyroidism  E03.9     8. Persistent hypersomnia  G47.10     9. Severe opioid dependence in sustained remission on maintenance therapy (HCC)  F11.21     10. Sedative, hypnotic or anxiolytic use disorder, moderate, in sustained remission (HCC)  F13.21     11. Alcohol use disorder, severe, in sustained remission ([name])  F10.21     12. Familial tremor  G25.0     13. History of ADHD  Z86.59       Past Psychiatric History:  NOVANT Alcohol intoxication 03/26/2017  Alcohol withdrawal delirium 03/26/2017  Chronic daily headache 02/25/2017  Alcohol use disorder, severe, dependence 02/22/2017  Heroin dependence MAT Methadone Metro/Subutex TBR 01/2018  Anxiety disorder, unspecified 01/20/2017  Paranoia 03/2018    [place], Stella O, NP - 04/12/2017 12:38 PM EST Formatting of this note might be different from the original. [facility] Psychiatry - Substance Abuse PHP/IOP Intake Assessment  BH Formulate an individual and appropriate relapse prevention plan.    [place] SU Problem: Formation of a relapse prevention plan    [facility] Uncomplicated opioid dependence (CMS/HCC) 12/24/2017  1. Discharge from hospital. 2. Follow up at [facility].                        [facility]             12/15/2007 -05/21/2010   Past Medical History:  Past Medical History:  Diagnosis Date   ADHD    Anxiety    Brain tumor ([place])    balance and occ memory issues and headaches   Brain tumor ([place])    sees [facility] year   Depression    Headache    migraine and tension   Hypothyroidism    Soft tissue mass    left shoulder   Substance abuse ([place])     Past Surgical History:  Procedure Laterality Date   colonscopy     gamma knife radiation 2018 for brain tumor'     hematoma removed from arm Left    MASS EXCISION Left 06/30/2019   Procedure: EXCISION OF SOFT TISSUE  MASS LEFT SHOULDER;  Surgeon: Armandina Gemma, MD;  Location: [place];  Service: General;  Laterality: Left;  LMA    Family Psychiatric History:  Alcoholism and drug abuse on  father's side  Family History:  Family History [facility] Relation Name Status Comments  Brother   Social worker    Father   Alive    Maternal Grandfather   Deceased    Maternal Grandmother   Deceased    Mother   Alive    Paternal Grandfather   Deceased    Paternal Grandmother   Deceased    Sister   Alive        Social History:   Social History:  Social History    Marital status: Single      Spouse name: NA   Number of children: NA   Years of education: 32   Highest education level:    Occupational History   Not on file  Tobacco Use   Smoking status: Current Every Day Smoker      Types: Cigarettes   Smokeless tobacco: Never Used   Tobacco comment: quit Jan 2020  Vaping Use   Vaping Use: Never used  Substance and Sexual Activity   Alcohol use: Not Currently      Comment: quit oct 2019   Drug use: Yes      Types:  Cocaine,Heroin  IV      Comment: last used OCT  2019 per pt Started MAT Buprenorphine   Sexual activity: Not on file  Other Topics Concern      Social History Narrative       Emergency planning/management officer Strain:    Difficulty of Paying Living Expenses: Disability  Food Insecurity:    Worried About Charity fundraiser in the Last Year: On disability   Arboriculturist in the Last Year:   Transportation Needs:    Film/video editor (Medical): Uses Ecologist (Non-Medical):   Physical Activity:    Days of Exercise per Week:    Minutes of Exercise per Session:   Stress:    Feeling of Stress : Constant   Social Connections:    Frequency of Communication with Friends and Family: Mother recently kicked him out of house   Frequency of Social Gatherings with Friends and Family: Lives alone-   Attends Religious Services: No   Active Member of Clubs or Organizations: Not active with AA /NA   Attends Archivist Meetings:        Allergies:  Allergies  Allergen Reactions   Naloxone Palpitations    Pt report MAT Rx is Subutex/Buprenorphine only   Amphetamine-Dextroamphetamine Other (See Comments)    Metabolic Disorder Labs: Hormone Panel Order: [id number] Status: Preliminary result    Visible to patient: No (not released)    Dx: Alcohol use disorder, severe, in sust...    0 Result Notes     Component Ref Range & Units 9 d ago  TSH uU/mL 8.2 High    Comment: Reference Range:  Non-Pregnant Adult 0.450-4.500   T4 ug/dL 4.2   Comment: Reference Range:  Adults: 4.2 - 13.0   Free T-3 pg/mL 2.8   Comment: Reference Range:  >=20y: 2.0 - 4.4   Triiodothyronine (T-3), Serum ng/dL 69   Comment: Reference Range:  Adults: 55 - 170   Testosterone, Serum (Total) ng/dL 432   Comment: This test was developed and its performance characteristics  determined by [facility]. It has not been cleared or approved  by the Food and Drug Administration.  Reference Range:   Adult Males  >18 years    79 - 71  This [facility] LC/MS-MS method is currently certified by the  [employer] Program (HoST).  Adult male  reference interval is based on a population of healthy  nonobese males (BMI <30) between 6 and 65 years old.  Maximiano Coss 1939,740;8144-8185 PMID: [id number].   Free Testosterone, Serum pg/mL 65   Comment: Reference Range:  Adult Males: 52 - 280   Testosterone-% Free % 1.5   Comment: This test was developed and its performance characteristics  determined by [facility]. It has not been cleared or approved  by the Food and Drug Administration.  Reference Range:  Adult Males: 1.5 - 3.2   Follicle Stimulating Hormone mIU/mL 5.1   Comment: This test was developed and its performance characteristics  determined by [facility]. It has not been cleared or approved  by the Food and Drug Administration.  Reference Range:  Adult Males (20 - 50y): 2.0 - 9.2   Progesterone, Serum  WILL FOLLOW P   DHEA-Sulfate, LCMS ug/dL 65   Comment: This test was developed and its performance characteristics  determined by [facility]. It has not been cleared or approved  by the Food and Drug Administration.  Reference Range:  Adult Males (51 - 60y): <298   Sex Hormone Binding Globulin nmol/L 56.9   Comment: Reference Range:  >49y: 19.3 - 76.4   Estrone Sulfate  WILL FOLLOW P   Estradiol, Serum, MS  WILL FOLLOW P   Resulting Agency  [facility]       Narrative Performed byMaryan Puls Performed at:  [facility]  [address], [place], [place]  [id number]  Lab Director: Jake Bathe        [facility] 10/02/2020 HEMOGLOBIN A1C <5.7 % 5.3  Normal:       Less than 5.7%  Prediabetes:  5.7% to 6.4%  Diabetes:     Greater than 6.4%  eAG MG/DL 105    eAG Comment                   08/17/2302/07/2306/15/22 06/08/220 08/18/20 06/26/20  Glucose 99      106 High  80 101 High  97     76   results found for: CHOL, TRIG, HDL, CHOLHDL, VLDL,  LDLCALC Lipid Profile 10/02/20 Specimen:  Blood  Ref Range & Units 11 mo ago Comments  Total Cholesterol 25 - 199 MG/DL 185    Triglycerides 10 - 150 MG/DL 63    HDL Cholesterol 35 - 135 MG/DL 62    LDL Cholesterol Calculated 0 - 99 MG/DL 110 High     Total Chol / HDL Cholesterol <4.5 3.0    Non-HDL Cholesterol MG/DL 123    Thyroid Lab Results  Component Value Date   TSH 2.166 01/09/2018   TSH TSH09/22/18 TSH10/31/18 0.450 - 5.330 UIU/ML 0.45 - 4.50 mcIU/mL    TSH11/21/18  0.45 - 4.50 mcIU/mL  1.459 4.153   8.20 High     0.76   08/03/2017  Thyroid Pan 10/02/20 14:44el  Specimen:  Blood  Ref Range & Units 11 mo ago  TSH 0.45 - 5.00 UIU/ML 3.80   T4 TOTAL 5.5 - 11.8 mcg/dL 8.2   T3 Uptake 32.0 - 48.4 % 42.6   Free Thyroxine Index 4.8 - 11.3 9.0   Resulting Agency  [place] [facility]    Current Medications: Current Outpatient Medications  Medication Sig Dispense Refill   phentermine 37.5 MG capsule Take 1 capsule (37.5 mg total) by mouth every  morning. 30 capsule 0   thyroid (ARMOUR THYROID) 90 MG tablet Take 1 tablet (90 mg total) by mouth daily. 30 tablet 2   acetaminophen (TYLENOL) 500 MG tablet Take 2,000 mg by mouth 2 (two) times daily as needed for headache (pain).      ARIPiprazole (ABILIFY) 20 MG tablet Take 20 mg by mouth at bedtime as needed.     buprenorphine (SUBUTEX) 8 MG SUBL SL tablet Place 8 mg under the tongue 3 (three) times daily.     buprenorphine (SUBUTEX) 8 MG SUBL SL tablet Place under the tongue.     busPIRone (BUSPAR) 10 MG tablet      cephALEXin (KEFLEX) 500 MG capsule Take 500 mg by mouth 2 (two) times daily.     clindamycin (CLEOCIN T) 1 % SWAB Apply 1 each topically 2 (two) times daily.     Crisaborole (EUCRISA) 2 % OINT Apply topically.     doxycycline (VIBRAMYCIN) 50 MG capsule      ergocalciferol (VITAMIN D2) 1.25 MG (50000 UT) capsule Take by mouth.     Fluocinolone Acetonide 0.01 % OIL Apply 3 to 5 drops to affected  area of ears once to twice daily as needed.     gabapentin (NEURONTIN) 300 MG capsule Take 300 mg by mouth 2 (two) times daily as needed.     gabapentin (NEURONTIN) 300 MG capsule Take by mouth.     hydrocortisone 2.5 % lotion Apply topically.     hydrOXYzine (ATARAX/VISTARIL) 10 MG tablet Take 10 mg by mouth as needed.     hydrOXYzine (VISTARIL) 50 MG capsule Take 50 mg by mouth 3 (three) times daily.     meloxicam (MOBIC) 15 MG tablet      metroNIDAZOLE (METROGEL) 0.75 % gel Apply 1 application topically at bedtime.     modafinil (PROVIGIL) 200 MG tablet Take 200 mg by mouth 2 (two) times daily.     propranolol (INDERAL) 20 MG tablet Take 1 tablet by mouth 3 (three) times daily as needed.     tamsulosin (FLOMAX) 0.4 MG CAPS capsule Take by mouth.     topiramate (TOPAMAX) 100 MG tablet Take 1 tablet by mouth at bedtime.     No current facility-administered medications for this visit.     Musculoskeletal: Strength & Muscle Tone:  Gait & Station:  Patient leans:   Psychiatric Specialty Exam: Review of Systems  Constitutional:  Positive for activity change. Negative for appetite change, chills, diaphoresis, fatigue, fever and unexpected weight change.  Endocrine: Negative for cold intolerance, heat intolerance, polydipsia, polyphagia and polyuria.       TSH up  Allergic/Immunologic: Positive for environmental allergies. Negative for food allergies and immunocompromised state.  Neurological:  Positive for tremors. Negative for dizziness, seizures, syncope, facial asymmetry, speech difficulty, weakness, light-headedness, numbness and headaches.  Psychiatric/Behavioral:  Positive for agitation, hallucinations and sleep disturbance. Negative for behavioral problems, confusion, decreased concentration, dysphoric mood, self-injury and suicidal ideas. The patient is not nervous/anxious and is not hyperactive.    Height '5\' 11"'$  (1.803 m), weight 190 lb (86.2 kg).Body mass index is 26.5 kg/m.    General Appearance: Casual and less disheveled  Eye Contact:  Good  Speech:  Normal Rate and Pressured at times  Volume:   Varies-yelled at mom at one point  Mood:   Full range  Affect:  Congruent  Thought Process:  Coherent, Disorganized, Goal Directed, and Descriptions of Associations: Loose  Orientation:  Full (Time, Place, and Person)  Thought Content:  WDL, Delusions, and Hallucinations: Auditory   Suicidal Thoughts:  No  Homicidal Thoughts:  No  Memory:   Limited  Judgement:  Impaired  Insight:  Lacking  Psychomotor Activity:  Increased  Concentration:  Concentration: Fair and Attention Span: Fair  Recall:   Limited  Fund of Knowledge:  WDL  Language: Good  Akathisia:  No  Handed:  Right  AIMS (if indicated): Not done  Assets:  Desire for Improvement Financial Resources/Insurance [place] Talents/Skills Transportation Vocational/Educational  ADL's:  Impaired  Cognition:   Sleep:  Poor    Assessment: Stable but room for improvement.Await completion of Hormone panel    and Plan: Increase Abilify to 30 mg FU 1 weeek     [name], PA-C 09/18/2021, 5:01 PM

## 2021-09-18 NOTE — Telephone Encounter (Signed)
DNA swabs sent to [facility] genetic [facility]. Orders placed on web site. Per company results may take up to 14 days for results.

## 2021-09-22 ENCOUNTER — Other Ambulatory Visit (HOSPITAL_COMMUNITY): Payer: Self-pay | Admitting: Medical

## 2021-09-22 MED ORDER — PHENTERMINE HCL 37.5 MG PO CAPS: 37.5000 mg | ORAL_CAPSULE | ORAL | 0 refills | Status: DC

## 2021-09-22 MED ORDER — THYROID 90 MG PO TABS: 90.0000 mg | ORAL_TABLET | Freq: Every day | ORAL | 2 refills | Status: DC

## 2021-09-23 LAB — HORMONE PANEL (T4,TSH,FSH,TESTT,SHBG,DHEA,ETC)
DHEA-Sulfate, LCMS: 65 ug/dL
Estradiol, Serum, MS: 14 pg/mL
Estrone Sulfate: 34 ng/dL
Follicle Stimulating Hormone: 5.1 m[IU]/mL
Free T-3: 2.8 pg/mL
Free Testosterone, Serum: 65 pg/mL
Progesterone, Serum: <10 ng/dL
Sex Hormone Binding Globulin: 56.9 nmol/L
T4: 4.2 ug/dL
TSH: 8.2 uU/mL — ABNORMAL HIGH
Testosterone, Serum (Total): 432 ng/dL
Testosterone-% Free: 1.5 %
Triiodothyronine (T-3), Serum: 69 ng/dL

## 2021-09-25 ENCOUNTER — Encounter (HOSPITAL_COMMUNITY): Payer: Self-pay | Admitting: Medical

## 2021-09-25 ENCOUNTER — Ambulatory Visit (HOSPITAL_BASED_OUTPATIENT_CLINIC_OR_DEPARTMENT_OTHER): Payer: Medicare Other | Admitting: Medical

## 2021-09-25 VITALS — BP 160/105 | HR 105 | Temp 98.1°F | Ht 70.5 in | Wt 198.0 lb

## 2021-09-25 DIAGNOSIS — F1121 Opioid dependence, in remission: Secondary | ICD-10-CM | POA: Diagnosis not present

## 2021-09-25 DIAGNOSIS — C719 Malignant neoplasm of brain, unspecified: Secondary | ICD-10-CM

## 2021-09-25 DIAGNOSIS — I639 Cerebral infarction, unspecified: Secondary | ICD-10-CM | POA: Diagnosis not present

## 2021-09-25 DIAGNOSIS — Z923 Personal history of irradiation: Secondary | ICD-10-CM

## 2021-09-25 DIAGNOSIS — F1021 Alcohol dependence, in remission: Secondary | ICD-10-CM

## 2021-09-25 DIAGNOSIS — F22 Delusional disorders: Secondary | ICD-10-CM

## 2021-09-25 DIAGNOSIS — G250 Essential tremor: Secondary | ICD-10-CM

## 2021-09-25 DIAGNOSIS — Z8659 Personal history of other mental and behavioral disorders: Secondary | ICD-10-CM

## 2021-09-25 DIAGNOSIS — E039 Hypothyroidism, unspecified: Secondary | ICD-10-CM

## 2021-09-25 DIAGNOSIS — D330 Benign neoplasm of brain, supratentorial: Secondary | ICD-10-CM

## 2021-09-25 DIAGNOSIS — G4710 Hypersomnia, unspecified: Secondary | ICD-10-CM

## 2021-09-25 DIAGNOSIS — F39 Unspecified mood [affective] disorder: Secondary | ICD-10-CM

## 2021-09-25 DIAGNOSIS — F1321 Sedative, hypnotic or anxiolytic dependence, in remission: Secondary | ICD-10-CM

## 2021-09-25 MED ORDER — THYROID 90 MG PO TABS: 90.0000 mg | ORAL_TABLET | Freq: Every day | ORAL | 2 refills | Status: DC

## 2021-09-25 MED ORDER — PHENTERMINE HCL 37.5 MG PO CAPS: 37.5000 mg | ORAL_CAPSULE | ORAL | 0 refills | Status: DC

## 2021-09-25 NOTE — Progress Notes (Addendum)
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     S/P radiation therapy Z92.3   Change Dx          7.     Multiple lacunar infarcts (HCC) I63.81   Change Dx          8.     Memory dysfunction R41.3   Change Dx          9.     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'     hematoma removed from arm Left    MASS EXCISION Left 06/30/2019   Procedure: EXCISION OF SOFT TISSUE  MASS LEFT SHOULDER;  Surgeon: Armandina Gemma, MD;  Location: [place];  Service: General;  Laterality: Left;  LMA    Family Psychiatric History:  Alcoholism and drug abuse on father's side   Family History:  [place]  Hypertension Mother   Hypertension Father   Social History:  Social History   Socioeconomic History   Marital status: Single/Divorced    Spouse name: No   Number of children: No   Years of education: GED   Highest education level: Dance movement psychotherapist  Occupational History   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 He has had a gradual decline in his performance status, particularly with his memory, concentration, and anxiety. Over the past few years, his memory rapidly deteriorated and it seems episodic. He has been on disability since 2014 and has not been working since 2011.  Tobacco Use   Smoking status: Every Day    Types: Cigarettes in past   Smokeless tobacco: Vapes only now   Tobacco comments:    quit jan 2020  Vaping Use   Vaping Use: Daily  Substance and Sexual Activity   Alcohol use: Not Currently    Comment: quit oct 2019   Drug use: Yes    Types: Cocaine, IV Heroin [place]    Comment: last used cocaine 2019 per pt MAT Methadone and currently Buprenorphine   Sexual activity: Not on file  Other Topics Concern   Not on file  Social History Narrative   See PMH   Social Determinants of [facility] Physical Activity Answer Date Recorded  On average, how many days per week do you engage  in moderate to strenuous exercise (like walking fast, running, jogging, dancing, swimming, biking, or other activities that cause a light or heavy sweat)? Not asked     On average, how many minutes do you engage in exercise at this level? 0 min 07/10/2020   Social History Stress Answer Date Recorded  Do you feel stress - tense, restless, nervous, or anxious, or unable to sleep at night because your mind is troubled all the time - these days? Not at all 07/10/2020   Social History Financial Resource Strain Answer Date Recorded  How hard is it for you to pay for the very basics like food, housing, medical care, and heating? Not hard at all 07/10/2020   Social History Intimate Partner Violence Answer Date Recorded  Within the last year, have you been afraid of your partner or ex-partner? No 07/10/2020  Within the last year, have you been humiliated or emotionally abused in other ways by your partner or ex-partner? No 07/10/2020  Within the last year, have you been kicked, hit, slapped, or otherwise physically hurt by your partner or ex-partner? No 07/10/2020  Within the last year, have you been raped or forced to have any kind of sexual activity by your partner or ex-partner? No 07/10/2020   Social History Food Insecurity Answer Date Recorded  Within the past 12 months, you worried that your food would run out before you got money to buy more. Never true 07/10/2020  Within the past 12 months, the food you bought just didn't last and you didn't have money to get more. Never true 07/10/2020   Social History Transportation Needs Answer Date Recorded  In the past 12 months, has lack of transportation kept you from medical appointments or from getting medications? No 07/10/2020  In the past 12 months, has lack of transportation kept you from meetings, work, or getting things needed for daily living? No 07/10/2020   Social History Housing Stability Answer Date Recorded  In the last 12 months, was Bray a time when you were not able to pay the mortgage or rent on time? No 07/10/2020  In the last 12 months, how many places have you lived? 1 07/10/2020  In  the last 12 months, was Bray a time when you did not have a steady place to sleep or slept in a shelter (including now)? No 07/10/2020    Allergies:  Allergies  Allergen Reactions   Naloxone Palpitations    Pt report MAT Rx is Subutex/Buprenorphine only   Amphetamine-Dextroamphetamine Other (See Comments)    Metabolic Disorder Labs: TSH 8.2   High    uU/mL Final  Comment:  Reference Range:  Non-Pregnant Adult 0.450-4.500   T4 4.2    ug/dL Final  Comment:  Reference Range:  Adults: 4.2 - 13.0   Free T-3 2.8    pg/mL Final  Comment:  Reference Range:  >=20y: 2.0 - 4.4   Triiodothyronine (T-3), Serum 69    ng/dL Final  Comment:  Reference Range:  Adults: 55 - 170   Testosterone, Serum (Total) 432    ng/dL Final  Comment:  This test was developed and its performance characteristics  determined by [facility]. It has not been cleared or approved  by the Food and Drug Administration.  Reference Range:  Adult Males  >18 years    51 - 32  This [facility] LC/MS-MS method is currently certified by the  [facility] Hormone Standardization Program (HoST).  Adult male  reference interval is based on a population  of healthy  nonobese males (BMI <30) between 69 and 15 years old.  Paul Bray Bray [id number] PMID: [id number].   Free Testosterone, Serum 65    pg/mL Final  Comment:  Reference Range:  Adult Males: 52 - 280   Testosterone-% Free 1.5    % Final  Comment:  This test was developed and its performance characteristics  determined by [facility]. It has not been cleared or approved  by the Food and Drug Administration.  Reference Range:  Adult Males: 1.5 - 3.2   Follicle Stimulating Hormone 5.1    mIU/mL Final  Comment:  This test was developed and its performance characteristics  determined by [facility]. It has not been cleared or approved  by the Food and Drug Administration.  Reference Range:  Adult Males (20 - 50y): 2.0 - 9.2   Progesterone, Serum <10    ng/dL Final   Comment:  This test was developed and its performance characteristics  determined by [facility]. It has not been cleared or approved  by the Food and Drug Administration.  Reference Range:  Adult Males: <10-11   DHEA-Sulfate, LCMS 65    ug/dL Final  Comment:  This test was developed and its performance characteristics  determined by [facility]. It has not been cleared or approved  by the Food and Drug Administration.  Reference Range:  Adult Males (51 - 60y): <298   Sex Hormone Binding Globulin 56.9    nmol/L Final  Comment:  Reference Range:  >49y: 19.3 - 76.4   Estrone Sulfate 34    ng/dL Final  Comment:  This test was developed and its performance characteristics  determined by [facility]. It has not been cleared or approved  by the Food and Drug Administration.  Reference Range:  Adult Males: <10 - 138   Estradiol, Serum, MS 14    pg/mL Final  Comment:  This test was developed and its performance characteristics  determined by [facility]. It has not been cleared or approved  by the Food and Drug Administration.  Reference Range:  Adult Males: 8.0 - 35      Therapeutic Level Labs: NA  Current Medications: acetaminophen 500 MG tablet Commonly known as: TYLENOL Take 2,000 mg by mouth 2 (two) times daily as needed for headache (pain).  ARIPiprazole 20 MG tablet Commonly known as: ABILIFY Take 20 mg by mouth at bedtime as needed.  * buprenorphine 8 MG Subl SL tablet Commonly known as: SUBUTEX Place 8 mg under the tongue 3 (three) times daily.  * buprenorphine 8 MG Subl SL tablet Commonly known as: SUBUTEX Place under the tongue.  busPIRone 10 MG tablet Commonly known as: BUSPAR   clindamycin 1 % Swab Commonly known as: CLEOCIN T Apply 1 each topically 2 (two) times daily.  doxycycline 50 MG capsule Commonly known as: VIBRAMYCIN   ergocalciferol 1.25 MG (50000 UT) capsule Commonly known as: VITAMIN D2 Take 50,000 Units by mouth.  Eucrisa 2 % Oint Generic drug: Crisaborole  Apply topically.  Fluocinolone Acetonide 0.01 % Oil Apply 3 to 5 drops to affected area of ears once to twice daily as needed.  * gabapentin 300 MG capsule Commonly known as: NEURONTIN Take 300 mg by mouth 2 (two) times daily as needed (sleep).  * gabapentin 300 MG capsule Commonly known as: NEURONTIN Take by mouth.  hydrocortisone 2.5 % lotion Apply topically.  hydrOXYzine 10 MG tablet Commonly known as: ATARAX Take 10 mg by mouth as needed.  hydrOXYzine 50 MG capsule Commonly known  as: VISTARIL Take 50 mg by mouth 3 (three) times daily.  meloxicam 15 MG tablet Commonly known as: MOBIC   metroNIDAZOLE 0.75 % gel Commonly known as: METROGEL Apply 1 application topically at bedtime.  phentermine 37.5 MG capsule Take 1 capsule (37.5 mg total) by mouth every morning.  propranolol 20 MG tablet Commonly known as: INDERAL Take 1 tablet by mouth 3 (three) times daily as needed.  thyroid 90 MG tablet Commonly known as: Armour Thyroid Take 1 tablet (90 mg total) by mouth daily.  topiramate 100 MG tablet Commonly known as: TOPAMAX Take 100 mg by mouth at bedtime   No current facility-administered medications for this visit.   No current outpatient medications on file.   Facility-Administered Medications Ordered in Other Visits  Medication Dose Route Frequency Provider Last Rate Last Admin   acetaminophen (TYLENOL) tablet 500 mg  500 mg Oral BID PRN Lacretia Leigh, MD       ARIPiprazole (ABILIFY) tablet 20 mg  20 mg Oral QHS Quintella Reichert, MD   20 mg at 09/28/21 2212   buprenorphine (SUBUTEX) sublingual tablet 8 mg  8 mg Sublingual TID Quintella Reichert, MD   8 mg at 09/29/21 0109   cloNIDine (CATAPRES) tablet 0.1 mg  0.1 mg Oral Once Lacretia Leigh, MD       gabapentin (NEURONTIN) capsule 300 mg  300 mg Oral BID Quintella Reichert, MD   300 mg at 09/29/21 3235   hydrOXYzine (ATARAX) tablet 10 mg  10 mg Oral PRN Lacretia Leigh, MD   10 mg at 09/28/21 0150   nicotine (NICODERM CQ - dosed in mg/24  hours) patch 21 mg  21 mg Transdermal Daily Quintella Reichert, MD   21 mg at 09/29/21 5732   nicotine polacrilex (NICORETTE) gum 2 mg  2 mg Oral PRN Larene Pickett, PA-C   2 mg at 09/28/21 0150   propranolol (INDERAL) tablet 20 mg  20 mg Oral TID Quintella Reichert, MD   20 mg at 09/28/21 1801   thyroid (ARMOUR) tablet 90 mg  90 mg Oral Daily Lacretia Leigh, MD   90 mg at 09/29/21 0905    Musculoskeletal: Strength & Muscle Tone: within normal limits Gait & Station: normal Patient leans: N/A  Psychiatric Specialty Exam: Review of Systems  Constitutional:  Positive for activity change and unexpected weight change (Overqweight). Negative for appetite change, chills, diaphoresis, fatigue and fever.  HENT: Negative.    Eyes:  Negative for photophobia, pain, discharge, redness and itching.  Respiratory: Negative.         Vapes  Cardiovascular: Negative.   Gastrointestinal: Negative.   Endocrine: Negative for cold intolerance, heat intolerance, polydipsia, polyphagia and polyuria.       Hypothyroid  Genitourinary: Negative.   Musculoskeletal: Negative.   Skin:  Positive for color change (Rosacea absent today). Negative for pallor, rash and wound.  Allergic/Immunologic: Negative for environmental allergies, food allergies and immunocompromised state.  Neurological:  Positive for tremors. Negative for dizziness, seizures, syncope, facial asymmetry, speech difficulty, weakness, light-headedness, numbness and headaches.  Hematological:  Negative for adenopathy. Does not bruise/bleed easily.  Psychiatric/Behavioral:  Positive for agitation (improved), behavioral problems, confusion, decreased concentration, dysphoric mood, hallucinations and sleep disturbance. Negative for self-injury and suicidal ideas. The patient is nervous/anxious. The patient is not hyperactive.    Blood pressure (!) 160/105, pulse (!) 105, temperature 98.1 F (36.7 C), height 5' 10.5" (1.791 m), weight 198 lb (89.8 kg).Body mass  index is 28.01 kg/m.  General Appearance: Casual  Eye Contact:  Bray  Speech:  Clear and Coherent and Normal Rate  Volume:  Normal  Mood:   Full range  Affect:  Congruent  Thought Process:  Coherent, Goal Directed, and Descriptions of Associations: Loose  Orientation:  Full (Time, Place, and Person)  Thought Content: WDL, Logical, Illogical, Delusions, and Hallucinations: Auditory Communicated with 3 voices 2 malev 1 male (Mother questions multiple personality ?)    Suicidal Thoughts:  No  Homicidal Thoughts:  No  Memory:   Intact but can be confused/lost track of if paranoia is great enough  Judgement:  Impaired  Insight:   Interestingly he has insight into 3 voices but if his paranoia takes over he lacks any insight at all  Psychomotor Activity:  Negative  Concentration:  Concentration: Fair and Attention Span: Fair  Recall:   Varies with alterations in Mental Status  Fund of Knowledge:  WDL  Language: Bray  Akathisia:  No  Handed:  Right  AIMS (if indicated): not done  Assets:  Desire for Improvement Financial Resources/Insurance Housing Social Support Talents/Skills Transportation  ADL's:  Impaired  Cognition: Impaired,  Moderate and Severe  Sleep:  Poor    Assessment: Seems to be more stable Mental Status wise ie has functional ability with 3 voices .Paranoia is not prevelant probably due to Abilify.  Getting on better with Mom He got more amphetamine despite being told not to with his Phentermine. 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  This along with his inability to take medications properly and his ability to persuade multiple providers to give him controlled substances like ADDERALL/Modanafil (while on Buprenorphine MAT ) and he admission to taking meds  as he feels the need all make any attempt to control his psychosis impossible.  and Plan: Meds reordered to [facility].  Reviewed meds again and reinforced taking as prescribed. Get In Vitae gene study when complete FU 1 week    [name], PA-C 09/25/2021 4:18 PM  Addendum 10/02/2021  Pt admitted to [address] [facility] for SI.  Spoke with Mom yesterday: Mom called to report pt still in ED holding area. He has not been moved to [facility]. She is not pleased with facility reviews online. She says pt wants to go [facility] to be taken off Buprenorphine. ED will not give Phentermine due to BP issues. Psychitrist in past noted he should not be on Adderall. Also Mom reported pt told ED he had started taking Ectasy-(Tox screen in ED was outdated 5 panel. He was not screened for MDMA.) Mom also reports ED will not cooperate to get MRI done scheduled for today. He is a voluntary patient.   Suggested that she speak to Social Worker/Case Mgr in ED about Pts status.It has become increasingly clear he lacks the ability to take proper care of himself especially when it comes to medications. She says  he cannot afford assisted living- Suggested she call [facility] re detox for Subutex? This could also be discussed with Case Mgr.  be discussed with Case Mgr.        Electronically signed by Dara Hoyer, PA-C at 10/01/2021 11:03 AM

## 2021-09-27 ENCOUNTER — Ambulatory Visit (HOSPITAL_COMMUNITY)
Admission: AD | Admit: 2021-09-27 | Discharge: 2021-09-27 | Disposition: A | Discharge status: Home / Self Care | Payer: Medicare Other | Attending: Psychiatry | Admitting: Psychiatry

## 2021-09-27 ENCOUNTER — Other Ambulatory Visit: Payer: Self-pay | Admitting: Psychiatry

## 2021-09-27 ENCOUNTER — Ambulatory Visit (HOSPITAL_COMMUNITY): Admission: RE | Admit: 2021-09-27 | Payer: Self-pay | Source: Home / Self Care | Admitting: Psychiatry

## 2021-09-27 ENCOUNTER — Encounter (HOSPITAL_COMMUNITY): Payer: Self-pay

## 2021-09-27 ENCOUNTER — Other Ambulatory Visit: Payer: Self-pay

## 2021-09-27 ENCOUNTER — Emergency Department (HOSPITAL_COMMUNITY): Payer: Medicare Other

## 2021-09-27 ENCOUNTER — Emergency Department (HOSPITAL_COMMUNITY)
Admission: EM | Admit: 2021-09-27 | Discharge: 2021-10-02 | Disposition: A | Discharge status: Other Acute Inpatient Hospital | Payer: Medicare Other | Attending: Emergency Medicine | Admitting: Emergency Medicine

## 2021-09-27 DIAGNOSIS — Z79899 Other long term (current) drug therapy: Secondary | ICD-10-CM | POA: Insufficient documentation

## 2021-09-27 DIAGNOSIS — R000 Tachycardia, unspecified: Secondary | ICD-10-CM | POA: Diagnosis not present

## 2021-09-27 DIAGNOSIS — R440 Auditory hallucinations: Secondary | ICD-10-CM | POA: Diagnosis present

## 2021-09-27 DIAGNOSIS — Y900 Blood alcohol level of less than 20 mg/100 ml: Secondary | ICD-10-CM | POA: Insufficient documentation

## 2021-09-27 DIAGNOSIS — F22 Delusional disorders: Secondary | ICD-10-CM | POA: Diagnosis present

## 2021-09-27 DIAGNOSIS — I10 Essential (primary) hypertension: Secondary | ICD-10-CM | POA: Diagnosis not present

## 2021-09-27 DIAGNOSIS — Z20822 Contact with and (suspected) exposure to covid-19: Secondary | ICD-10-CM | POA: Insufficient documentation

## 2021-09-27 DIAGNOSIS — Z043 Encounter for examination and observation following other accident: Secondary | ICD-10-CM | POA: Diagnosis not present

## 2021-09-27 DIAGNOSIS — R45851 Suicidal ideations: Secondary | ICD-10-CM | POA: Diagnosis not present

## 2021-09-27 DIAGNOSIS — E039 Hypothyroidism, unspecified: Secondary | ICD-10-CM | POA: Insufficient documentation

## 2021-09-27 DIAGNOSIS — F1994 Other psychoactive substance use, unspecified with psychoactive substance-induced mood disorder: Secondary | ICD-10-CM | POA: Diagnosis present

## 2021-09-27 LAB — COMPREHENSIVE METABOLIC PANEL
ALT: 80 U/L — ABNORMAL HIGH (ref 0–44)
AST: 56 U/L — ABNORMAL HIGH (ref 15–41)
Albumin: 4.7 g/dL (ref 3.5–5.0)
Alkaline Phosphatase: 69 U/L (ref 38–126)
Anion gap: 11 (ref 5–15)
BUN: 21 mg/dL — ABNORMAL HIGH (ref 6–20)
CO2: 24 mmol/L (ref 22–32)
Calcium: 9.3 mg/dL (ref 8.9–10.3)
Chloride: 101 mmol/L (ref 98–111)
Creatinine, Ser: 0.98 mg/dL (ref 0.61–1.24)
GFR, Estimated: >60 mL/min (ref >60)
Glucose, Bld: 110 mg/dL — ABNORMAL HIGH (ref 70–99)
Potassium: 4.4 mmol/L (ref 3.5–5.1)
Sodium: 136 mmol/L (ref 135–145)
Total Bilirubin: 1.1 mg/dL (ref 0.3–1.2)
Total Protein: 7.7 g/dL (ref 6.5–8.1)

## 2021-09-27 LAB — CBC WITH DIFFERENTIAL/PLATELET
Abs Immature Granulocytes: 0.02 10*3/uL (ref 0.00–0.07)
Basophils Absolute: 0 10*3/uL (ref 0.0–0.1)
Basophils Relative: 0 %
Eosinophils Absolute: 0 10*3/uL (ref 0.0–0.5)
Eosinophils Relative: 0 %
HCT: 43.4 % (ref 39.0–52.0)
Hemoglobin: 14.9 g/dL (ref 13.0–17.0)
Immature Granulocytes: 0 %
Lymphocytes Relative: 23 %
Lymphs Abs: 1.3 10*3/uL (ref 0.7–4.0)
MCH: 34 pg (ref 26.0–34.0)
MCHC: 34.3 g/dL (ref 30.0–36.0)
MCV: 99.1 fL (ref 80.0–100.0)
Monocytes Absolute: 0.4 10*3/uL (ref 0.1–1.0)
Monocytes Relative: 8 %
Neutro Abs: 3.7 10*3/uL (ref 1.7–7.7)
Neutrophils Relative %: 69 %
Platelets: 205 10*3/uL (ref 150–400)
RBC: 4.38 MIL/uL (ref 4.22–5.81)
RDW: 12.8 % (ref 11.5–15.5)
WBC: 5.5 10*3/uL (ref 4.0–10.5)
nRBC: 0 % (ref 0.0–0.2)

## 2021-09-27 LAB — ETHANOL: Alcohol, Ethyl (B): <10 mg/dL (ref <10)

## 2021-09-27 LAB — SALICYLATE LEVEL: Salicylate Lvl: <7 mg/dL — ABNORMAL LOW (ref 7.0–30.0)

## 2021-09-27 LAB — ACETAMINOPHEN LEVEL: Acetaminophen (Tylenol), Serum: <10 ug/mL — ABNORMAL LOW (ref 10–30)

## 2021-09-27 MED ORDER — CLONIDINE HCL 0.1 MG PO TABS
0.1000 mg | ORAL_TABLET | Freq: Once | ORAL | Status: DC
  Filled 2021-09-27: qty 1

## 2021-09-27 MED ORDER — HYDROXYZINE HCL 10 MG PO TABS
10.0000 mg | ORAL_TABLET | ORAL | Status: DC | PRN
Start: 2021-09-27 — End: 2021-10-02
  Administered 2021-09-28 – 2021-09-29 (×2): 10 mg via ORAL
  Filled 2021-09-27 (×3): qty 1

## 2021-09-27 MED ORDER — LACTATED RINGERS IV SOLN: INTRAVENOUS | Status: DC

## 2021-09-27 MED ORDER — LACTATED RINGERS IV BOLUS
2000.0000 mL | Freq: Once | INTRAVENOUS | Status: AC
  Administered 2021-09-27: 2000 mL via INTRAVENOUS

## 2021-09-27 MED ORDER — ACETAMINOPHEN 500 MG PO TABS
500.0000 mg | ORAL_TABLET | Freq: Two times a day (BID) | ORAL | Status: DC | PRN
  Administered 2021-09-29 – 2021-09-30 (×2): 500 mg via ORAL
  Filled 2021-09-27 (×2): qty 1

## 2021-09-27 MED ORDER — LORAZEPAM 2 MG/ML IJ SOLN
1.0000 mg | Freq: Once | INTRAMUSCULAR | Status: AC
  Administered 2021-09-27: 1 mg via INTRAVENOUS
  Filled 2021-09-27: qty 1

## 2021-09-27 MED ORDER — THYROID 60 MG PO TABS
90.0000 mg | ORAL_TABLET | Freq: Every day | ORAL | Status: DC
  Administered 2021-09-28 – 2021-10-01 (×4): 90 mg via ORAL
  Filled 2021-09-27 (×4): qty 1

## 2021-09-27 NOTE — ED Notes (Signed)
1 pt belongings bag placed in triage cabinets.

## 2021-09-27 NOTE — ED Notes (Signed)
Pt's mom took belongings bag home with her.

## 2021-09-27 NOTE — H&P (Signed)
Behavioral Health Medical Screening Exam  Paul Bray is an 56 y.o. male with past psychiatric history of schizophrenia, paranoid personality, delusional disorder, severe opioid dependence, alcohol use disorder, paranoia, bipolar and related disorder, substance induced mood disorder, polysubstance abuse who presented to [facility] voluntarily with his mother for suicidal thoughts, feelings of guilt irritability and anger, auditory/visual hallucinations.   Patient reports people being in his place messing with his medications. Mother reports patient manages his own medications and is unable to confirm compliance. Patient is poor historian and appears flushed with head down reporting feeling "exhausted". He continues to endorse feelings of paranoia and auditory hallucinations; "unable to tell what is real". Patient's current bp elevated; most recent TSH 8.2; unable to state last time he has taken thyroid (any) medication. PDMP reviewed; active prescriptions noted for Modafinil 200 mg filled 09/02/21, Buprenorphine 8 mg 09/09/21, Dextroamp-Amphetamine 30 mg 09/23/21, Phentermine 37.5 mg 09/25/21.   Based on patient presentation, vital signs patient transferred to [facility] for medical clearance.   Per chart review patient seen   Total Time spent with patient: 20 minutes  Psychiatric Specialty Exam: Physical Exam Vitals and nursing note reviewed.  Constitutional:      Appearance: He is ill-appearing.  HENT:     Head: Normocephalic.     Nose: Nose normal.     Mouth/Throat:     Mouth: Mucous membranes are dry.  Cardiovascular:     Rate and Rhythm: Tachycardia present.  Pulmonary:     Effort: Pulmonary effort is normal.  Abdominal:     Palpations: Abdomen is soft.  Musculoskeletal:        General: Normal range of motion.     Cervical back: Normal range of motion.  Skin:    General: Skin is warm and dry.  Neurological:     Mental Status: He is disoriented.  Psychiatric:        Attention and  Perception: He perceives auditory hallucinations.        Mood and Affect: Affect is flat and inappropriate.        Speech: Speech is delayed.        Behavior: Behavior is slowed and withdrawn.        Thought Content: Thought content is paranoid and delusional. Thought content includes suicidal ideation.        Judgment: Judgment is inappropriate.   Review of Systems  Constitutional:  Positive for appetite change and fatigue.  Psychiatric/Behavioral:  Positive for decreased concentration, hallucinations, sleep disturbance and suicidal ideas.   There were no vitals taken for this visit.There is no height or weight on file to calculate BMI. General Appearance: Disheveled Eye Contact:  Minimal Speech:  Slow Volume:  Decreased Mood:  Dysphoric Affect:  Restricted Thought Process:  Descriptions of Associations: Loose Orientation:  Other:  partial Thought Content:  Illogical, Delusions, Paranoid Ideation, and Rumination Suicidal Thoughts:  Yes.  without intent/plan Homicidal Thoughts:  No Memory:  Immediate;   Fair Recent;   Poor Judgement:  Poor Insight:  Shallow Psychomotor Activity:  Decreased Concentration: Concentration: Fair and Attention Span: Fair Recall:  [employer]: Fair Akathisia:  NA Handed:  Left AIMS (if indicated):    Assets:  Financial Resources/Insurance Housing Physical Health Resilience Social Support Sleep:     Musculoskeletal: Strength & Muscle Tone: decreased Gait & Station: normal Patient leans: N/A  There were no vitals taken for this visit.  Recommendations: Based on my evaluation the patient appears to have an emergency medical  condition for which I recommend the patient be transferred to the emergency department for further evaluation. Report called to [place]. Patient verbalized agreement to medical transfer and treatment. [facility] called.   Inda Merlin, NP 09/27/2021, 3:58 PM

## 2021-09-27 NOTE — ED Triage Notes (Signed)
Pt arrives to ED from [facility] for psych eval. Pt reports SI with a plan to "overmedicate". Pt also reports audio and visual command hallucinations. Pt accompanied by mother. Voluntary at this time. Pt reports recent ecstasy use.

## 2021-09-27 NOTE — ED Provider Notes (Signed)
[facility] DEPT Provider Note   CSN: [id number] Arrival date & time: 09/27/21  1616     History  Chief Complaint  Patient presents with   Suicidal   Hallucinations    Paul Bray is a 56 y.o. male.  56 year old male presents with his mother from behavioral health Hospital due to increasing paranoia as was auditory hallucinations.  Patient has had some vague suicidal ideations.  He denies any intentional ingestions for suicide.  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  Denies any use of cocaine.  Patient seen by therapist this week and had new medications started which he has not had filled yet.  Patient also is being treated for hypothyroidism but has not filled those medications as well.  He does note also to that he has been noncompliant with his Suboxone treatment.  Feels that he might be going through some withdrawal from that.  Patient also has history of hypertension and has not been compliant with his medications for that.  While at behavioral Hospital patient was noted to be hypertensive and was sent here for medical clearance     Home Medications Prior to Admission medications   Medication Sig Start Date End Date Taking? Authorizing Provider  acetaminophen (TYLENOL) 500 MG tablet Take 2,000 mg by mouth 2 (two) times daily as needed for headache (pain).     [provider]  ARIPiprazole (ABILIFY) 20 MG tablet Take 20 mg by mouth at bedtime as needed. 09/03/21   [provider]  buprenorphine (SUBUTEX) 8 MG SUBL SL tablet Place 8 mg under the tongue 3 (three) times daily.    [provider]  buprenorphine (SUBUTEX) 8 MG SUBL SL tablet Place under the tongue.    [provider]  busPIRone (BUSPAR) 10 MG tablet  01/22/20   [provider]  clindamycin (CLEOCIN T) 1 % SWAB Apply 1 each topically 2 (two) times daily. 05/31/19   [provider]  Crisaborole (EUCRISA) 2 % OINT Apply topically.  12/23/20   [provider]  doxycycline (VIBRAMYCIN) 50 MG capsule  10/19/19   [provider]  ergocalciferol (VITAMIN D2) 1.25 MG (50000 UT) capsule Take by mouth. 07/11/20   [provider]  Fluocinolone Acetonide 0.01 % OIL Apply 3 to 5 drops to affected area of ears once to twice daily as needed. 03/27/21   [provider]  gabapentin (NEURONTIN) 300 MG capsule Take 300 mg by mouth 2 (two) times daily as needed.    [provider]  gabapentin (NEURONTIN) 300 MG capsule Take by mouth.    [provider]  hydrocortisone 2.5 % lotion Apply topically. 08/08/21   [provider]  hydrOXYzine (ATARAX/VISTARIL) 10 MG tablet Take 10 mg by mouth as needed.    [provider]  hydrOXYzine (VISTARIL) 50 MG capsule Take 50 mg by mouth 3 (three) times daily. 07/23/21   [provider]  meloxicam (MOBIC) 15 MG tablet  01/16/21   [provider]  metroNIDAZOLE (METROGEL) 0.75 % gel Apply 1 application topically at bedtime. 08/14/19   [provider]  phentermine 37.5 MG capsule Take 1 capsule (37.5 mg total) by mouth every morning. 09/25/21 10/25/21  Dara Hoyer, PA-C  propranolol (INDERAL) 20 MG tablet Take 1 tablet by mouth 3 (three) times daily as needed. Patient not taking: Reported on 09/25/2021 04/29/21   [provider]  thyroid (ARMOUR THYROID) 90 MG tablet Take 1 tablet (90 mg total) by mouth  daily. 09/25/21 12/24/21  Dara Hoyer, PA-C  topiramate (TOPAMAX) 100 MG tablet Take 1 tablet by mouth at bedtime.    [provider]      Allergies    Naloxone and Amphetamine-dextroamphetamine    Review of Systems   Review of Systems  All other systems reviewed and are negative.  Physical Exam Updated Vital Signs BP (!) 146/107   Pulse (!) 130   Temp 98.7 F (37.1 C) (Oral)   Resp 20   Ht 1.778 m ('5\' 10"'$ )   Wt 88.5 kg   SpO2 100%   BMI 27.98 kg/m  Physical Exam Vitals and nursing  note reviewed.  Constitutional:      General: He is not in acute distress.    Appearance: Normal appearance. He is well-developed. He is not toxic-appearing.  HENT:     Head: Normocephalic and atraumatic.  Eyes:     General: Lids are normal.     Conjunctiva/sclera: Conjunctivae normal.     Pupils: Pupils are equal, round, and reactive to light.  Neck:     Thyroid: No thyroid mass.     Trachea: No tracheal deviation.  Cardiovascular:     Rate and Rhythm: Regular rhythm. Tachycardia present.     Heart sounds: Normal heart sounds. No murmur heard.   No gallop.  Pulmonary:     Effort: Pulmonary effort is normal. No respiratory distress.     Breath sounds: Normal breath sounds. No stridor. No decreased breath sounds, wheezing, rhonchi or rales.  Abdominal:     General: There is no distension.     Palpations: Abdomen is soft.     Tenderness: There is no abdominal tenderness. There is no rebound.  Musculoskeletal:        General: No tenderness. Normal range of motion.     Cervical back: Normal range of motion and neck supple.  Skin:    General: Skin is warm and dry.     Findings: No abrasion or rash.  Neurological:     General: No focal deficit present.     Mental Status: He is alert and oriented to person, place, and time. Mental status is at baseline.     GCS: GCS eye subscore is 4. GCS verbal subscore is 5. GCS motor subscore is 6.     Cranial Nerves: No cranial nerve deficit.     Sensory: No sensory deficit.     Motor: Motor function is intact.  Psychiatric:        Attention and Perception: Attention normal.        Mood and Affect: Affect is blunt.        Speech: Speech is delayed.        Behavior: Behavior is slowed and withdrawn.   ED Results / Procedures / Treatments   Labs (all labs ordered are listed, but only abnormal results are displayed) Labs Reviewed  CBC WITH DIFFERENTIAL/PLATELET  COMPREHENSIVE METABOLIC PANEL  RAPID URINE DRUG SCREEN, HOSP PERFORMED   ETHANOL  ACETAMINOPHEN LEVEL  SALICYLATE LEVEL    EKG EKG Interpretation  Date/Time:  Saturday September 27 2021 17:10:43 EDT Ventricular Rate:  130 PR Interval:  150 QRS Duration: 86 QT Interval:  306 QTC Calculation: 450 R Axis:   -4 Text Interpretation: Sinus tachycardia Confirmed by Lacretia Leigh ([id number]) on 09/27/2021 5:18:54 PM  Radiology No results found.  Procedures Procedures    Medications Ordered in ED Medications  lactated ringers bolus 2,000 mL (has no administration in time range)  LORazepam (ATIVAN) injection 1 mg (has no administration in time range)    ED Course/ Medical Decision Making/ A&P                           Medical Decision Making Amount and/or Complexity of Data Reviewed Labs: ordered. Radiology: ordered. ECG/medicine tests: ordered.  Risk OTC drugs. Prescription drug management.   Patient is EKG per my interpretation shows sinus tachycardia.  No signs of acute ischemic changes.  Patient states that he did strike his head several days ago and head CT per my interpretation showed no acute findings.  Was hypertensive tachycardic.  Suspect some element of withdrawal.  Treat with IV fluids as well as IV Ativan and heart rate and blood pressure have greatly improved.  Patient's laboratory studies significant for slight elevation in his transaminases.  He has normal bili.  Do not feel that this needs further work-up and needs to be monitored.  Patient will require psychiatric hospitalization due to his worsening hallucinations.  Patient is not medically cleared for behavioral health team  CRITICAL CARE Performed by: Leota Jacobsen Total critical care time: 45 minutes Critical care time was exclusive of separately billable procedures and treating other patients. Critical care was necessary to treat or prevent imminent or life-threatening deterioration. Critical care was time spent personally by me on the following activities: development of  treatment plan with patient and/or surrogate as well as nursing, discussions with consultants, evaluation of patient's response to treatment, examination of patient, obtaining history from patient or surrogate, ordering and performing treatments and interventions, ordering and review of laboratory studies, ordering and review of radiographic studies, pulse oximetry and re-evaluation of patient's condition.        Final Clinical Impression(s) / ED Diagnoses Final diagnoses:  None    Rx / DC Orders ED Discharge Orders     None         Lacretia Leigh, MD 09/27/21 1914

## 2021-09-28 LAB — RAPID URINE DRUG SCREEN, HOSP PERFORMED
Amphetamines: POSITIVE — AB
Barbiturates: NOT DETECTED
Benzodiazepines: NOT DETECTED
Cocaine: NOT DETECTED
Opiates: NOT DETECTED
Tetrahydrocannabinol: NOT DETECTED

## 2021-09-28 LAB — RESP PANEL BY RT-PCR (FLU A&B, COVID) ARPGX2
Influenza A by PCR: NEGATIVE
Influenza B by PCR: NEGATIVE
SARS Coronavirus 2 by RT PCR: NEGATIVE

## 2021-09-28 MED ORDER — BUPRENORPHINE HCL 8 MG SL SUBL
8.0000 mg | SUBLINGUAL_TABLET | Freq: Three times a day (TID) | SUBLINGUAL | Status: DC
  Administered 2021-09-28 – 2021-10-01 (×13): 8 mg via SUBLINGUAL
  Filled 2021-09-28 (×13): qty 1

## 2021-09-28 MED ORDER — NICOTINE 21 MG/24HR TD PT24
21.0000 mg | MEDICATED_PATCH | Freq: Every day | TRANSDERMAL | Status: DC
  Administered 2021-09-28 – 2021-09-30 (×3): 21 mg via TRANSDERMAL
  Filled 2021-09-28 (×3): qty 1

## 2021-09-28 MED ORDER — LORAZEPAM 2 MG/ML IJ SOLN
1.0000 mg | Freq: Once | INTRAMUSCULAR | Status: AC
Start: 2021-09-28 — End: 2021-09-28
  Administered 2021-09-28: 1 mg via INTRAVENOUS
  Filled 2021-09-28: qty 1

## 2021-09-28 MED ORDER — ARIPIPRAZOLE 10 MG PO TABS
20.0000 mg | ORAL_TABLET | Freq: Every day | ORAL | Status: DC
  Administered 2021-09-28 – 2021-10-01 (×5): 20 mg via ORAL
  Filled 2021-09-28 (×5): qty 2

## 2021-09-28 MED ORDER — PROPRANOLOL HCL 20 MG PO TABS
20.0000 mg | ORAL_TABLET | Freq: Three times a day (TID) | ORAL | Status: DC
  Administered 2021-09-28 – 2021-10-01 (×10): 20 mg via ORAL
  Filled 2021-09-28 (×11): qty 1

## 2021-09-28 MED ORDER — TAMSULOSIN HCL 0.4 MG PO CAPS
0.4000 mg | ORAL_CAPSULE | Freq: Once | ORAL | Status: AC
Start: 2021-09-28 — End: 2021-09-28
  Administered 2021-09-28: 0.4 mg via ORAL
  Filled 2021-09-28: qty 1

## 2021-09-28 MED ORDER — GABAPENTIN 300 MG PO CAPS
300.0000 mg | ORAL_CAPSULE | Freq: Two times a day (BID) | ORAL | Status: DC
  Administered 2021-09-28 – 2021-10-01 (×9): 300 mg via ORAL
  Filled 2021-09-28 (×9): qty 1

## 2021-09-28 MED ORDER — NICOTINE POLACRILEX 2 MG MT GUM
2.0000 mg | CHEWING_GUM | OROMUCOSAL | Status: DC | PRN
  Administered 2021-09-28 – 2021-10-01 (×3): 2 mg via ORAL
  Filled 2021-09-28 (×4): qty 1

## 2021-09-28 NOTE — ED Notes (Addendum)
Pt came back from restroom and asked this writer why I was treating him like he is an " asshole?' Pt advised that I do not think that of him and when questioned about why he hasn't gotten his Gabapentin and his Subutex he was advised it was not given because it had not yet been ordered and he was sleeping up until this point. Pt then began yelling at this writer after he was advised that the doctor would be messaged for orders. Pt yells at this writer that he has a brain tumor and does this Probation officer know what happens to people when they have brain tumors? Pt also states that the story of EMS bringing him here from [place] is " bullshit" and he wants to know how he really got here. He was explained this exact process by multiple staff members. Pt continues yelling as security comes to help diffuse situation if needed. Pt was also wanting nicotine and was advised that this was a tobacco free campus but we could get him a nicotine patch ordered if needed. Pt wanted to know where his belongings were in order to grab a source of nicotine. Pt advised multiple times that his mother took his belongings with her not long after he arrived at the ED. Pt also states " that is bullshit." Pt continues yelling at multiple staff members including ED provider.

## 2021-09-28 NOTE — ED Notes (Signed)
Patient to room 30. Patient calm, cooperative, no s/s of distress. Patient orietnted to unit and room.

## 2021-09-28 NOTE — Progress Notes (Signed)
Per Oneida Alar, NP, patient meets criteria for inpatient treatment. There are no available beds at [facility] today. CSW faxed referrals to the following facilities for review:  [facility] [address], [place] [address] [phone] --  [place] N/A [address], [place] [address] [phone] --  [facility]  Pending - Request Sent N/A [address] [place] [address] [phone] --  [address] [place] [address] [phone] --  [place] N/A [place], [place] [address] [phone] --  [facility] [address] [place], [place] [address] [phone] --  [place] [place] [address] [phone] --  [facility] [address] [place] [address] [phone] --  [facility]  Pending - Request Sent N/A [place]., [place] [address] [phone] --  [place] N/A [address] [place] [address] [phone] --  [facility]  Pending - Request Sent N/A [place], [place] [address] [phone] --  [facility]  Pending - Request Sent N/A [address], [place] [address] [phone] --  [place] N/A [address], [place] [address] [phone] --  [facility]  Pending - Request Sent N/A  [address] [place] [address] [phone] --   TTS will continue to seek bed placement.  Glennie [place], MSW, [place] Phone: [phone] Disposition/TOC

## 2021-09-28 NOTE — ED Notes (Signed)
Patient mashing the emergency button on the [facility] repeatedly. Patient asked to stop and got angry. Patient then asked for soda and a fresh blanket. Both were given to him.

## 2021-09-28 NOTE — ED Notes (Signed)
Pt ambulated with assistance of this writer to the restroom. Pt advised to pull call bell when ready for help getting back to his room. Pt remained in triage restroom and was checked on multiple times.

## 2021-09-28 NOTE — ED Notes (Signed)
Patient in a triage 5. No sitter present. Door open.

## 2021-09-28 NOTE — ED Provider Notes (Signed)
Called to bedside for patient agitation.  Patient anxious, pacing.  He is upset about delay in his medications.  Patient able to be redirected with conversation.  Home medications reordered.  Given labile affect, agitation, SI and HI will complete IVC for patient's safety awaiting full psychiatric recommendations.   Quintella Reichert, MD 09/28/21 [phone]

## 2021-09-28 NOTE — ED Notes (Signed)
Pt able to ambulate to restroom without assistance.

## 2021-09-28 NOTE — ED Provider Notes (Signed)
Medicallty cleared.   Lennice Sites, DO 09/28/21 1139

## 2021-09-28 NOTE — ED Notes (Addendum)
Patient quiet.   Guarded.  Denied suicidal ideation or homicidal ideation.  Endorsed auditory hallucinations.

## 2021-09-28 NOTE — ED Notes (Signed)
Assumed care at this time. Pt lying in bed with eyes closed, equal rise and fall of chest.

## 2021-09-28 NOTE — ED Notes (Signed)
Patient came out of his room and started yelling

## 2021-09-29 ENCOUNTER — Encounter (HOSPITAL_COMMUNITY): Payer: Self-pay | Admitting: Medical

## 2021-09-29 DIAGNOSIS — F22 Delusional disorders: Secondary | ICD-10-CM | POA: Diagnosis not present

## 2021-09-29 DIAGNOSIS — F200 Paranoid schizophrenia: Secondary | ICD-10-CM | POA: Diagnosis not present

## 2021-09-29 DIAGNOSIS — F203 Undifferentiated schizophrenia: Secondary | ICD-10-CM | POA: Diagnosis not present

## 2021-09-29 NOTE — ED Provider Notes (Signed)
Emergency Medicine Observation Re-evaluation Note  Paul Bray is a 56 y.o. male, seen on rounds today.  Pt initially presented to the ED for complaints of Suicidal and Hallucinations Currently, the patient is awake.  Physical Exam  BP 91/66 (BP Location: Left Arm)   Pulse 64   Temp (!) 97.4 F (36.3 C) (Oral)   Resp 16   Ht '5\' 10"'$  (1.778 m)   Wt 88.5 kg   SpO2 95%   BMI 27.98 kg/m  Physical Exam Neurological:     General: No focal deficit present.     Mental Status: He is alert.  Psychiatric:        Mood and Affect: Mood normal.     ED Course / MDM  EKG:EKG Interpretation  Date/Time:  Saturday September 27 2021 17:10:43 EDT Ventricular Rate:  130 PR Interval:  150 QRS Duration: 86 QT Interval:  306 QTC Calculation: 450 R Axis:   -4 Text Interpretation: Sinus tachycardia Confirmed by Lacretia Leigh ([id number]) on 09/27/2021 5:18:54 PM  I have reviewed the labs performed to date as well as medications administered while in observation.  Recent changes in the last 24 hours include nothing.  Plan  Current plan is for inpatient treatment.  NAVI ERBER is under involuntary commitment.     Lennice Sites, DO 09/29/21 [phone]

## 2021-09-29 NOTE — Progress Notes (Signed)
Patient has been denied by [facility] due to no appropriate beds available. Patient meets [facility] inpatient criteria per Oneida Alar, NP. Patient has been faxed out to the following facilities:   [facility]  [address], [place] [address] [phone]  [place] [address] [place] [address] [phone]  [facility]  [address], [place] [address] [place]  [facility]  [address] [place] [address] [phone] [place]  [place], [place] [address] [phone]  [facility]  [address] [place], [place] [address] [phone] [facility]  [place] [place] [address] [phone]  [facility]  [address], [place] [address] [phone]  [facility]  [address], [place] [address] [phone] [place]  [address] [place] [address] [phone] [facility]  [address] [place]  [address] [phone]  [facility]  [address] [place] [address] [phone] [facility]  [address], [place] [address] [phone]  [facility]  [address] [place] [address] [phone]   Mariea Clonts, MSW, LCSW-A  12:08 PM 09/29/2021

## 2021-09-29 NOTE — Consult Note (Addendum)
[facility] ED ASSESSMENT   Reason for Consult:  Psychiatry evaluation Referring Physician:  ER Physician Patient Identification: Paul Bray MRN:  [id number] ED Chief Complaint: Delusional disorder, persecutory type [facility])  Diagnosis:  Principal Problem:   Delusional disorder, persecutory type ([place]) Active Problems:   Substance induced mood disorder [facility])   ED Assessment Time Calculation: Start Time: 1500 Stop Time: 1542 Total Time in Minutes (Assessment Completion): 42 Time spent includes speaking with his mother.  Subjective:   Paul Bray is a 56 y.o. male patient admitted with hx significant for .schizophrenia, paranoid personality, delusional disorder, severe opioid dependence, alcohol use disorder, paranoia, bipolar and related disorder, substance induced mood disorder, polysubstance abuse.  He is seen by  Tonita Phoenix at [facility] outpatient Psychiatric clinic for his mental health care.  HPI:  56 y.o. Caucasian  male patient admitted with hx significant for .schizophrenia, paranoid personality, delusional disorder, severe opioid dependence, alcohol use disorder, paranoia, bipolar and related disorder, substance induced mood disorder, polysubstance abuse.  He is seen by  Tonita Phoenix at [facility] outpatient Psychiatric clinic for his mental health care.  Patient was seen resting in the room.  He engaged in meaningful conversation.  He stated that he came to the ER to get off Subutex in a controlled environment.  He also admitted to Opiate abuse as well.  Patient reported feeling suicidal off and on but not at this time.  He, however reported constantly feeling paranoid that something bad is going to happen to him or his mom, some people are "messing with my medications"  He reported Hypersomnia and stated appetite is good.  He want to get off Subutex because he is afraid he may not get it until mid June.  Medically patient suffers from Elevated BP and Hypothyroidism. Collateral  information -Mother who came for a visit reported that her son is very paranoid and calls her 6 times a day.  Some times he calls his mother to check her /his account because people are stealing money.  Mother reported that middle of the night patient calls the [facility] to do a welfare check on her because he believes something happened to his mother.  He even have reported that his Landlord and his boyfriend want to kill him.  Mother reports these are all Paranoia.  Patient was IVC yesterday due to agitation and severe mood elevation.  Today he was calm and cooperative during our interaction..  He has been accepted for admission and records are faxed to various hospitals within the state.  Home medications are resumed while we seek bed placement.  Past Psychiatric History:  Hx  is significant for .schizophrenia, paranoid personality, delusional disorder, severe opioid dependence, alcohol use disorder, paranoia, bipolar and related disorder, substance induced mood disorder, polysubstance abuse.  He is seen by  Tonita Phoenix at [facility] outpatient Psychiatric clinic for his mental health care.  One admission noted at [facility] inpatient Psych unit.  Risk to Self or Others: Is the patient at risk to self? Yes Has the patient been a risk to self in the past 6 months? Yes Has the patient been a risk to self within the distant past? Yes Is the patient a risk to others? No Has the patient been a risk to others in the past 6 months? No Has the patient been a risk to others within the distant past? No  [place] Scale:  Flowsheet Row ED from 09/27/2021 in [place] DEPT  C-SSRS RISK CATEGORY High  Risk       AIMS:  , , ,  ,   ASAM:    Substance Abuse:     Past Medical History:  Past Medical History:  Diagnosis Date   ADHD    Anxiety    Brain tumor ([place])    balance and occ memory issues and headaches   Brain tumor ([place])    [facility]es [facility]   Depression     Headache    migraine and tension   Hypothyroidism    Soft tissue mass    left shoulder   Substance abuse ([place])     Past Surgical History:  Procedure Laterality Date   colonscopy     gamma knife radiation 2018 for brain tumor'     hematoma removed from arm Left    MASS EXCISION Left 06/30/2019   Procedure: EXCISION OF SOFT TISSUE  MASS LEFT SHOULDER;  Surgeon: Armandina Gemma, MD;  Location: [place];  Service: General;  Laterality: Left;  LMA   Family History: No family history on file. Family Psychiatric  History: Per mom-Alcoholism in both sides of the family. Social History:  Social History   Substance and Sexual Activity  Alcohol Use Not Currently   Comment: quit oct 2019     Social History   Substance and Sexual Activity  Drug Use Yes   Types: Cocaine, IV   Comment: last used cocaine 2019 per pt    Social History   Socioeconomic History   Marital status: Single    Spouse name: Not on file   Number of children: Not on file   Years of education: Not on file   Highest education level: Not on file  Occupational History   Not on file  Tobacco Use   Smoking status: Every Day    Types: Cigarettes   Smokeless tobacco: Never   Tobacco comments:    quit jan 2020  Vaping Use   Vaping Use: Never used  Substance and Sexual Activity   Alcohol use: Not Currently    Comment: quit oct 2019   Drug use: Yes    Types: Cocaine, IV    Comment: last used cocaine 2019 per pt   Sexual activity: Not on file  Other Topics Concern   Not on file  Social History Narrative   Not on file   Social Determinants of Health   Financial Resource Strain: Not on file  Food Insecurity: Not on file  Transportation Needs: Not on file  Physical Activity: Not on file  Stress: Not on file  Social Connections: Not on file   Additional Social History:    Allergies:   Allergies  Allergen Reactions   Naloxone Palpitations    Pt report MAT Rx is Subutex/Buprenorphine only    Amphetamine-Dextroamphetamine Other (See Comments)    Labs:  Results for orders placed or performed during the hospital encounter of 09/27/21 (from the past 48 hour(s))  CBC with Differential/Platelet     Status: None   Collection Time: 09/27/21  5:04 PM  Result Value Ref Range   WBC 5.5 4.0 - 10.5 K/uL   RBC 4.38 4.22 - 5.81 MIL/uL   Hemoglobin 14.9 13.0 - 17.0 g/dL   HCT 43.4 39.0 - 52.0 %   MCV 99.1 80.0 - 100.0 fL   MCH 34.0 26.0 - 34.0 pg   MCHC 34.3 30.0 - 36.0 g/dL   RDW 12.8 11.5 - 15.5 %   Platelets 205 150 - 400  K/uL   nRBC 0.0 0.0 - 0.2 %   Neutrophils Relative % 69 %   Neutro Abs 3.7 1.7 - 7.7 K/uL   Lymphocytes Relative 23 %   Lymphs Abs 1.3 0.7 - 4.0 K/uL   Monocytes Relative 8 %   Monocytes Absolute 0.4 0.1 - 1.0 K/uL   Eosinophils Relative 0 %   Eosinophils Absolute 0.0 0.0 - 0.5 K/uL   Basophils Relative 0 %   Basophils Absolute 0.0 0.0 - 0.1 K/uL   Immature Granulocytes 0 %   Abs Immature Granulocytes 0.02 0.00 - 0.07 K/uL    Comment: Performed at [facility], [place] [address] [place], [place] [address]  Comprehensive metabolic panel     Status: Abnormal   Collection Time: 09/27/21  5:04 PM  Result Value Ref Range   Sodium 136 135 - 145 mmol/L   Potassium 4.4 3.5 - 5.1 mmol/L   Chloride 101 98 - 111 mmol/L   CO2 24 22 - 32 mmol/L   Glucose, Bld 110 (H) 70 - 99 mg/dL    Comment: Glucose reference range applies only to samples taken after fasting for at least 8 hours.   BUN 21 (H) 6 - 20 mg/dL   Creatinine, Ser 0.98 0.61 - 1.24 mg/dL   Calcium 9.3 8.9 - 10.3 mg/dL   Total Protein 7.7 6.5 - 8.1 g/dL   Albumin 4.7 3.5 - 5.0 g/dL   AST 56 (H) 15 - 41 U/L   ALT 80 (H) 0 - 44 U/L   Alkaline Phosphatase 69 38 - 126 U/L   Total Bilirubin 1.1 0.3 - 1.2 mg/dL   GFR, Estimated >60 >60 mL/min    Comment: (NOTE) Calculated using the CKD-EPI Creatinine Equation (2021)    Anion gap 11 5 - 15    Comment: Performed at [facility], [place] [address] [place], [place] [address]  Ethanol     Status: None   Collection Time: 09/27/21  5:04 PM  Result Value Ref Range   Alcohol, Ethyl (B) <10 <10 mg/dL    Comment: (NOTE) Lowest detectable limit for serum alcohol is 10 mg/dL.  For medical purposes only. Performed at [facility], [place] [address] [place], [place] [address]   Acetaminophen level     Status: Abnormal   Collection Time: 09/27/21  5:04 PM  Result Value Ref Range   Acetaminophen (Tylenol), Serum <10 (L) 10 - 30 ug/mL    Comment: (NOTE) Therapeutic concentrations vary significantly. A range of 10-30 ug/mL  may be an effective concentration for many patients. However, some  are best treated at concentrations outside of this range. Acetaminophen concentrations >150 ug/mL at 4 hours after ingestion  and >50 ug/mL at 12 hours after ingestion are often associated with  toxic reactions.  Performed at [facility], [place] [address] [place], [place] [address]   Salicylate level     Status: Abnormal   Collection Time: 09/27/21  5:04 PM  Result Value Ref Range   Salicylate Lvl <0.2 (L) 7.0 - 30.0 mg/dL    Comment: Performed at [facility], [place] [address] [place], [place] [address]  Rapid urine drug screen (hospital performed)     Status: Abnormal   Collection Time: 09/28/21  2:21 AM  Result Value Ref Range   Opiates NONE DETECTED NONE DETECTED   Cocaine NONE DETECTED NONE DETECTED   Benzodiazepines NONE DETECTED NONE DETECTED   Amphetamines POSITIVE (A) NONE DETECTED  Tetrahydrocannabinol NONE DETECTED NONE DETECTED   Barbiturates NONE DETECTED NONE DETECTED    Comment: (NOTE) DRUG SCREEN FOR MEDICAL PURPOSES ONLY.  IF CONFIRMATION IS NEEDED FOR ANY PURPOSE, NOTIFY LAB WITHIN 5 DAYS.  LOWEST DETECTABLE LIMITS FOR URINE DRUG SCREEN Drug Class                     Cutoff (ng/mL) Amphetamine and metabolites    1000 Barbiturate and  metabolites    200 Benzodiazepine                 381 Tricyclics and metabolites     300 Opiates and metabolites        300 Cocaine and metabolites        300 THC                            50 Performed at [facility], [place] [address], [place], [place] [address]   Resp Panel by RT-PCR (Flu A&B, Covid) Anterior Nasal Swab     Status: None   Collection Time: 09/28/21  2:21 AM   Specimen: Anterior Nasal Swab  Result Value Ref Range   SARS Coronavirus 2 by RT PCR NEGATIVE NEGATIVE    Comment: (NOTE) SARS-CoV-2 target nucleic acids are NOT DETECTED.  The SARS-CoV-2 RNA is generally detectable in upper respiratory specimens during the acute phase of infection. The lowest concentration of SARS-CoV-2 viral copies this assay can detect is 138 copies/mL. A negative result does not preclude SARS-Cov-2 infection and should not be used as the sole basis for treatment or other patient management decisions. A negative result may occur with  improper specimen collection/handling, submission of specimen other than nasopharyngeal swab, presence of viral mutation(s) within the areas targeted by this assay, and inadequate number of viral copies(<138 copies/mL). A negative result must be combined with clinical observations, patient history, and epidemiological information. The expected result is Negative.  Fact Sheet for Patients:  EntrepreneurPulse.com.au  Fact Sheet for Healthcare Providers:  IncredibleEmployment.be  This test is no t yet approved or cleared by the [place] FDA and  has been authorized for detection and/or diagnosis of SARS-CoV-2 by FDA under an Emergency Use Authorization (EUA). This EUA will remain  in effect (meaning this test can be used) for the duration of the COVID-19 declaration under Section 564(b)(1) of the Act, 21 U.S.C.section 360bbb-3(b)(1), unless the authorization is terminated  or revoked sooner.        Influenza A by PCR NEGATIVE NEGATIVE   Influenza B by PCR NEGATIVE NEGATIVE    Comment: (NOTE) The Xpert Xpress SARS-CoV-2/FLU/RSV plus assay is intended as an aid in the diagnosis of influenza from Nasopharyngeal swab specimens and should not be used as a sole basis for treatment. Nasal washings and aspirates are unacceptable for Xpert Xpress SARS-CoV-2/FLU/RSV testing.  Fact Sheet for Patients: EntrepreneurPulse.com.au  Fact Sheet for Healthcare Providers: IncredibleEmployment.be  This test is not yet approved or cleared by the [place] FDA and has been authorized for detection and/or diagnosis of SARS-CoV-2 by FDA under an Emergency Use Authorization (EUA). This EUA will remain in effect (meaning this test can be used) for the duration of the COVID-19 declaration under Section 564(b)(1) of the Act, 21 U.S.C. section 360bbb-3(b)(1), unless the authorization is terminated or revoked.  Performed at [facility], [place] [address], [place], [place] [address]     Current Facility-Administered Medications  Medication  Dose Route Frequency Provider Last Rate Last Admin   acetaminophen (TYLENOL) tablet 500 mg  500 mg Oral BID PRN Lacretia Leigh, MD       ARIPiprazole (ABILIFY) tablet 20 mg  20 mg Oral QHS Quintella Reichert, MD   20 mg at 09/28/21 2212   buprenorphine (SUBUTEX) sublingual tablet 8 mg  8 mg Sublingual TID Quintella Reichert, MD   8 mg at 09/29/21 3818   cloNIDine (CATAPRES) tablet 0.1 mg  0.1 mg Oral Once Lacretia Leigh, MD       gabapentin (NEURONTIN) capsule 300 mg  300 mg Oral BID Quintella Reichert, MD   300 mg at 09/29/21 2993   hydrOXYzine (ATARAX) tablet 10 mg  10 mg Oral PRN Lacretia Leigh, MD   10 mg at 09/28/21 0150   nicotine (NICODERM CQ - dosed in mg/24 hours) patch 21 mg  21 mg Transdermal Daily Quintella Reichert, MD   21 mg at 09/29/21 7169   nicotine polacrilex (NICORETTE) gum 2 mg  2 mg Oral PRN Larene Pickett, PA-C   2 mg at 09/28/21 0150   propranolol (INDERAL) tablet 20 mg  20 mg Oral TID Quintella Reichert, MD   20 mg at 09/28/21 1801   thyroid (ARMOUR) tablet 90 mg  90 mg Oral Daily Lacretia Leigh, MD   90 mg at 09/29/21 6789   No current outpatient medications on file.    Musculoskeletal: Strength & Muscle Tone:  seen in bed lying down Gait & Station:  seen in bed lying down Patient leans:  see above   Psychiatric Specialty Exam: Presentation  General Appearance: Appropriate for Environment; Casual; Neat  Eye Contact:Good  Speech:Clear and Coherent; Normal Rate  Speech Volume:Normal  Handedness:Right  Mood and Affect  Mood:Labile  Affect:Congruent   Thought Process  Thought Processes:Coherent; Goal Directed  Descriptions of Associations:Intact  Orientation:Full (Time, Place and Person)  Thought Content:Logical  History of Schizophrenia/Schizoaffective disorder:Yes  Duration of Psychotic Symptoms:Greater than six months  Hallucinations:Hallucinations: None  Ideas of Reference:Delusions; Paranoia; Percusatory  Suicidal Thoughts:Suicidal Thoughts: Yes, Passive  Homicidal Thoughts:Homicidal Thoughts: No   Sensorium  Memory:Immediate Good; Recent Good; Remote Good  Judgment:Fair  Insight:Fair   Executive Functions  Concentration:Fair  Attention Span:Fair  Recall:Good  Fund of Knowledge:Good  Language:Good   Psychomotor Activity  Psychomotor Activity:Psychomotor Activity: Normal  Assets  Assets:Communication Skills; Desire for Improvement; Housing; Leisure Time    Sleep  Sleep:Sleep: Good  Physical Exam: Physical Exam Vitals and nursing note reviewed.  Constitutional:      Appearance: He is ill-appearing.  HENT:     Nose: Nose normal.  Cardiovascular:     Rate and Rhythm: Normal rate and regular rhythm.  Pulmonary:     Effort: Pulmonary effort is normal.  Musculoskeletal:        General: Normal range of motion.  Skin:     General: Skin is warm and dry.  Neurological:     Mental Status: He is alert and oriented to person, place, and time.   Review of Systems  Constitutional: Negative.   HENT: Negative.    Eyes: Negative.   Respiratory: Negative.    Cardiovascular: Negative.   Gastrointestinal: Negative.   Genitourinary: Negative.   Musculoskeletal: Negative.   Skin: Negative.   Neurological: Negative.   Endo/Heme/Allergies: Negative.   Psychiatric/Behavioral:  Positive for hallucinations, substance abuse and suicidal ideas. The patient is nervous/anxious.   Blood pressure 102/71, pulse 70, temperature (!) 97.4 F (36.3 C), temperature source Oral, resp.  rate 16, height '5\' 10"'$  (1.778 m), weight 88.5 kg, SpO2 100 %. Body mass index is 27.98 kg/m.  Medical Decision Making: Patient meets criteria for inpatient Psychiatric hospitalization for safety and stabilization. Resume home Medications while waiting for bed.  Problem 1: Delusional Disorder, persecutory type  Problem 2: Substance induced mood disorder  Problem 3: Schizophrenia  Disposition:  admit, resume home medications, seek bed placement.  Delfin Gant, NP-PMHNP-BC 09/29/2021 3:44 PM

## 2021-09-30 MED ORDER — POLYETHYLENE GLYCOL 3350 17 G PO PACK
17.0000 g | PACK | Freq: Every day | ORAL | Status: DC
Start: 2021-09-30 — End: 2021-10-02
  Administered 2021-09-30 – 2021-10-01 (×2): 17 g via ORAL
  Filled 2021-09-30 (×2): qty 1

## 2021-09-30 MED ORDER — TRAZODONE HCL 50 MG PO TABS
50.0000 mg | ORAL_TABLET | Freq: Every day | ORAL | Status: DC
  Administered 2021-09-30 – 2021-10-01 (×2): 50 mg via ORAL
  Filled 2021-09-30 (×2): qty 1

## 2021-09-30 MED ORDER — TAMSULOSIN HCL 0.4 MG PO CAPS
0.4000 mg | ORAL_CAPSULE | Freq: Every day | ORAL | Status: DC
  Administered 2021-09-30 – 2021-10-01 (×2): 0.4 mg via ORAL
  Filled 2021-09-30 (×3): qty 1

## 2021-09-30 NOTE — ED Provider Notes (Signed)
Emergency Medicine Observation Re-evaluation Note  Paul Bray is a 56 y.o. male, seen on rounds today.  Pt initially presented to the ED for complaints of Suicidal and Hallucinations Currently, the patient is sleeping.  Physical Exam  BP 102/66 (BP Location: Left Arm)   Pulse 65   Temp 97.6 F (36.4 C) (Oral)   Resp 14   Ht '5\' 10"'$  (1.778 m)   Wt 88.5 kg   SpO2 98%   BMI 27.98 kg/m  Physical Exam General: Sleeping Cardiac: Extremities perfused Lungs: Breathing unlabored Psych: Deferred  ED Course / MDM  EKG:EKG Interpretation  Date/Time:  Saturday September 27 2021 17:10:43 EDT Ventricular Rate:  130 PR Interval:  150 QRS Duration: 86 QT Interval:  306 QTC Calculation: 450 R Axis:   -4 Text Interpretation: Sinus tachycardia Confirmed by Lacretia Leigh ([id number]) on 09/27/2021 5:18:54 PM  I have reviewed the labs performed to date as well as medications administered while in observation.  Recent changes in the last 24 hours include none.  Plan  Current plan is for inpatient treatment.  Paul Bray is not under involuntary commitment.     Godfrey Pick, MD 09/30/21 [phone]

## 2021-09-30 NOTE — Consult Note (Signed)
  Patient continues to require inpatient hospitalization.  He reported poor sleep at night and Trazodone is prescribed.  Patient however takes naps during the day while waiting for bed placement  We will continue to look for bed at any facility with available bed.

## 2021-09-30 NOTE — Progress Notes (Signed)
Patient has been denied by [facility] due to no appropriate beds available. Patient meets [place] inpatient criteria per Oneida Alar, NP. Patient has been faxed out to the following facilities:    [facility]  [address] [place] [address] [phone]  [place] [place] [address] [phone]  [facility]  [address] [place] [address] [place]  [facility]  [address] [place] [address] [phone] [place]  [place], [place] [address] [phone]  [facility]  [address] [place], [place] [address] [phone] [facility]  [place] [place] [address] [phone]  [facility]  [address] [place] [address] [phone]  [facility]  [address], [place] [address] [phone] [place]  [address], [place] [address] [phone] [facility]  [address] [place] [address] [phone]  [facility]  [address] [place] [address] [phone] [facility]  [address], [place] [address] [phone]  [facility]  [address] [place] [address] [place]   Mariea Clonts, MSW, LCSW-A  12:03 PM 09/30/2021

## 2021-10-01 ENCOUNTER — Ambulatory Visit (HOSPITAL_COMMUNITY): Admission: RE | Admit: 2021-10-01 | Payer: Medicare Other | Source: Ambulatory Visit

## 2021-10-01 ENCOUNTER — Telehealth (HOSPITAL_COMMUNITY): Payer: Self-pay

## 2021-10-01 DIAGNOSIS — G4710 Hypersomnia, unspecified: Secondary | ICD-10-CM

## 2021-10-01 DIAGNOSIS — F22 Delusional disorders: Secondary | ICD-10-CM

## 2021-10-01 DIAGNOSIS — F1321 Sedative, hypnotic or anxiolytic dependence, in remission: Secondary | ICD-10-CM

## 2021-10-01 DIAGNOSIS — F1021 Alcohol dependence, in remission: Secondary | ICD-10-CM

## 2021-10-01 DIAGNOSIS — G250 Essential tremor: Secondary | ICD-10-CM

## 2021-10-01 DIAGNOSIS — Z8659 Personal history of other mental and behavioral disorders: Secondary | ICD-10-CM

## 2021-10-01 DIAGNOSIS — D330 Benign neoplasm of brain, supratentorial: Secondary | ICD-10-CM

## 2021-10-01 DIAGNOSIS — I639 Cerebral infarction, unspecified: Secondary | ICD-10-CM

## 2021-10-01 DIAGNOSIS — Z923 Personal history of irradiation: Secondary | ICD-10-CM

## 2021-10-01 DIAGNOSIS — I10 Essential (primary) hypertension: Secondary | ICD-10-CM

## 2021-10-01 DIAGNOSIS — C719 Malignant neoplasm of brain, unspecified: Secondary | ICD-10-CM

## 2021-10-01 DIAGNOSIS — F1121 Opioid dependence, in remission: Secondary | ICD-10-CM

## 2021-10-01 DIAGNOSIS — E039 Hypothyroidism, unspecified: Secondary | ICD-10-CM

## 2021-10-01 LAB — SARS CORONAVIRUS 2 BY RT PCR: SARS Coronavirus 2 by RT PCR: NEGATIVE

## 2021-10-01 NOTE — ED Notes (Signed)
Report can be called to [phone]

## 2021-10-01 NOTE — Telephone Encounter (Signed)
Mom called to report pt still in ED holding area. He has not been moved to [facility]. She is not pleased with facility reviews online. She says pt wants to go there to be taken off Buprenorphine. ED will not give Phentermine due to BP issues. Psychitrist in past noted he should not be on Adderall. Also Mom reported pt told ED he had started taking Ectasy-(Tox screen in ED was outdated 5 panel. He was not screened for MDMA.) Mom also reports ED will not cooperate to get MRI done scheduled for today. He is a voluntary patient.  Suggested that she speak to Social Worker/Case Mgr in ED about Pts status.It has become increasingly clear he lacks the ability to take proper care of himself especially when it comes to medications. She says he cannot afford assisted living- Suggested she call [facility] re detox for Subutex? This could also be discussed with Case Mgr.

## 2021-10-01 NOTE — Consult Note (Signed)
Telepsych Consultation   Reason for Consult:  Psychiatric Reassessment Referring Physician:  EDP Location of Patient:   [facility]  Location of Provider: Other: virtual home office  Patient Identification: CHARLY HOLCOMB MRN:  [id number] Principal Diagnosis: Delusional disorder, persecutory type ([place]) Diagnosis:  Principal Problem:   Delusional disorder, persecutory type ([place]) Active Problems:   Substance induced mood disorder ([place])   Total Time spent with patient: 30 minutes  Subjective:   AZAHEL BELCASTRO is a 56 y.o. male patient with hx of schizophrenia, polysubstance abuse, admitted with command hallucinations with plan to overdose on pills  HPI:  Patient seen via telepsych by this provider; chart reviewed and consulted with Dr. Dwyane Dee on 10/01/21.  On evaluation CAVION FAIOLA reports he slept "okay" last night, appetite is okay.  He continues to endorse command hallucinations with plan to overdose on pills if discharged today.  Reports a hx for chronic audible hallucinations and states he's been able to deal with it by ignoring the voice.  Recently stated hearing 4-5 voices all at once, telling him to end his life and also telling him someone is going to hurt his mother.  States the voices are tormenting and getting harder to ignore-thus he's afraid he may take pills or jump off a 7 story building.  He does endorse hx of polysubstance abuse, reports drug use as coping mechanism.  Drug use does not exacerbate the voices.    Pt agreed to his mother being present during the start of assessment but then asks her to leave for fear of "upsetting her."  Past Psychiatric History:  Hx  is significant for .schizophrenia, paranoid personality, delusional disorder, severe opioid dependence, alcohol use disorder, paranoia, bipolar and related disorder, substance induced mood disorder, polysubstance abuse.  He is seen by  Tonita Phoenix at [facility] outpatient Psychiatric clinic for his mental health  care.  One admission noted at [facility] inpatient Psych unit.   Risk to Self:  yes Risk to Others:  yes Prior Inpatient Therapy: yes  Prior Outpatient Therapy:  yes  Past Medical History:  Past Medical History:  Diagnosis Date   ADHD    Anxiety    Brain tumor ([place])    balance and occ memory issues and headaches   Brain tumor ([place])    sees [facility] q year   Depression    Headache    migraine and tension   Hypothyroidism    Soft tissue mass    left shoulder   Substance abuse ([place])     Past Surgical History:  Procedure Laterality Date   colonscopy     gamma knife radiation 2018 for brain tumor'     hematoma removed from arm Left    MASS EXCISION Left 06/30/2019   Procedure: EXCISION OF SOFT TISSUE  MASS LEFT SHOULDER;  Surgeon: Armandina Gemma, MD;  Location: [facility];  Service: General;  Laterality: Left;  LMA   Family History: No family history on file. Family Psychiatric  History: unknown Social History:  Social History   Substance and Sexual Activity  Alcohol Use Not Currently   Comment: quit oct 2019     Social History   Substance and Sexual Activity  Drug Use Yes   Types: Cocaine, IV   Comment: last used cocaine 2019 per pt    Social History   Socioeconomic History   Marital status: Single    Spouse name: Not on file   Number of children: Not on file  Years of education: Not on file   Highest education level: Not on file  Occupational History   Not on file  Tobacco Use   Smoking status: Former    Types: Cigarettes   Smokeless tobacco: Current   Tobacco comments:    quit jan 2020  Vaping Use   Vaping Use: Never used  Substance and Sexual Activity   Alcohol use: Not Currently    Comment: quit oct 2019   Drug use: Yes    Types: Cocaine, IV    Comment: last used cocaine 2019 per pt   Sexual activity: Not on file  Other Topics Concern   Not on file  Social History Narrative   Not on file   Social Determinants of Health    Financial Resource Strain: Not on file  Food Insecurity: Not on file  Transportation Needs: Not on file  Physical Activity: Not on file  Stress: Not on file  Social Connections: Not on file   Additional Social History:    Allergies:   Allergies  Allergen Reactions   Naloxone Palpitations    Pt report MAT Rx is Subutex/Buprenorphine only   Amphetamine-Dextroamphetamine Other (See Comments)    Labs: No results found for this or any previous visit (from the past 48 hour(s)).  Medications:  Current Facility-Administered Medications  Medication Dose Route Frequency Provider Last Rate Last Admin   acetaminophen (TYLENOL) tablet 500 mg  500 mg Oral BID PRN Lacretia Leigh, MD   500 mg at 09/30/21 1824   ARIPiprazole (ABILIFY) tablet 20 mg  20 mg Oral QHS Quintella Reichert, MD   20 mg at 09/30/21 2103   buprenorphine (SUBUTEX) sublingual tablet 8 mg  8 mg Sublingual TID Quintella Reichert, MD   8 mg at 10/01/21 1026   cloNIDine (CATAPRES) tablet 0.1 mg  0.1 mg Oral Once Lacretia Leigh, MD       gabapentin (NEURONTIN) capsule 300 mg  300 mg Oral BID Quintella Reichert, MD   300 mg at 10/01/21 1025   hydrOXYzine (ATARAX) tablet 10 mg  10 mg Oral PRN Lacretia Leigh, MD   10 mg at 09/29/21 2103   nicotine (NICODERM CQ - dosed in mg/24 hours) patch 21 mg  21 mg Transdermal Daily Quintella Reichert, MD   21 mg at 09/30/21 1011   nicotine polacrilex (NICORETTE) gum 2 mg  2 mg Oral PRN Larene Pickett, PA-C   2 mg at 09/28/21 0150   polyethylene glycol (MIRALAX / GLYCOLAX) packet 17 g  17 g Oral Daily Godfrey Pick, MD   17 g at 10/01/21 1026   propranolol (INDERAL) tablet 20 mg  20 mg Oral TID Quintella Reichert, MD   20 mg at 10/01/21 1025   tamsulosin (FLOMAX) capsule 0.4 mg  0.4 mg Oral QPC supper Daleen Bo, MD   0.4 mg at 09/30/21 2105   thyroid (ARMOUR) tablet 90 mg  90 mg Oral Daily Lacretia Leigh, MD   90 mg at 10/01/21 1025   traZODone (DESYREL) tablet 50 mg  50 mg Oral QHS Charmaine Downs C,  NP   50 mg at 09/30/21 2102   No current outpatient medications on file.    Musculoskeletal: pt able to move all extremities and ambulate independently Strength & Muscle Tone: within normal limits Gait & Station: normal Patient leans: N/A   Psychiatric Specialty Exam:  Presentation  General Appearance: Bizarre (in that he looks down at the bed for most of the assessment)  Eye Contact:Minimal  Speech:Clear and  Coherent  Speech Volume:Normal  Handedness:Right   Mood and Affect  Mood:Dysphoric; Depressed  Affect:Congruent; Flat   Thought Process  Thought Processes:Coherent  Descriptions of Associations:Intact  Orientation:Full (Time, Place and Person)  Thought Content:Logical (he has AH and has insight of this)  History of Schizophrenia/Schizoaffective disorder:Yes  Duration of Psychotic Symptoms:Greater than six months  Hallucinations:Hallucinations: Auditory; Command Description of Command Hallucinations: :telling me to take a bunch of pills and end my life" Description of Auditory Hallucinations: voices tell him someone is going to hurt his mother  Ideas of Reference:Paranoia  Suicidal Thoughts:Suicidal Thoughts: Yes, Active SI Active Intent and/or Plan: With Intent; With Plan; With Means to [place]  Homicidal Thoughts:Homicidal Thoughts: No   Sensorium  Memory:Immediate Good; Recent Good; Remote Good  Judgment:Impaired  Insight:Fair   Executive Functions  Concentration:Fair  Attention Span:Fair  [place]  Language:Good   Psychomotor Activity  Psychomotor Activity:Psychomotor Activity: Normal   Assets  Assets:Communication Skills; Housing; Desire for Improvement; Social Support   Sleep  Sleep:Sleep: Fair Number of Hours of Sleep: 6    Physical Exam: Physical Exam Cardiovascular:     Pulses: Normal pulses.  Pulmonary:     Effort: Pulmonary effort is normal.  Musculoskeletal:     Cervical back:  Normal range of motion.  Neurological:     General: No focal deficit present.     Mental Status: He is alert and oriented to person, place, and time.  Psychiatric:        Attention and Perception: Attention normal. He perceives auditory hallucinations.        Mood and Affect: Mood is depressed. Affect is blunt.        Speech: Speech normal.        Behavior: Behavior is cooperative.        Thought Content: Thought content is delusional. Thought content includes suicidal ideation. Thought content includes suicidal plan.        Cognition and Memory: Cognition and memory normal.        Judgment: Judgment is impulsive.   Review of Systems  Constitutional: Negative.   HENT: Negative.    Eyes: Negative.   Respiratory: Negative.    Cardiovascular: Negative.   Gastrointestinal: Negative.   Genitourinary: Negative.   Skin: Negative.   Neurological: Negative.   Endo/Heme/Allergies: Negative.   Psychiatric/Behavioral:  Positive for depression, hallucinations, substance abuse and suicidal ideas. The patient is nervous/anxious.   Blood pressure (P) 96/69, pulse (P) 80, temperature (P) 97.6 F (36.4 C), temperature source (P) Oral, resp. rate (P) 18, height '5\' 10"'$  (1.778 m), weight 88.5 kg, SpO2 (P) 100 %. Body mass index is 27.98 kg/m.  Treatment Plan Summary: Patient with dx of paranoid schizophrenia, continues to report command hallucinations with plan to overdose on pills or jump from a building.  He's judgement is impaired and he is an acute safety concern.  Pt needs inpatient admission for stabilization.  This was discussed with the patient who is voluntary and agrees with recommendations.  Daily contact with patient to assess and evaluate symptoms and progress in treatment and Medication management.  Continue meds  Disposition: Recommend psychiatric Inpatient admission when medically cleared.  Per [facility] there are no appropriate beds at [facility]  SW has already faxed him out  This service was  provided via telemedicine using a 2-way, interactive audio and Radiographer, therapeutic.  Names of all persons participating in this telemedicine service and their role in this encounter. Name:Akai Enzo Bi Role:  Patient  Name: Lenford Beddow Role: Pt mother  Name: Merlyn Lot Role: PMHNP   Mallie Darting, NP 10/01/2021 2:15 PM

## 2021-10-01 NOTE — Progress Notes (Signed)
Pt was accepted to [facility] 10/01/21 PENDING Negative COVID-19 results; Bed Assignment 407-1.  Pt meets inpatient criteria per Merlyn Lot, NP  Attending Physician will be Janine Limbo, MD  Report can be called to: -Adult unit: [phone]  Care Team Notified: Merlyn Lot, NP, [facility] Lynnda Shields, RN, Laurena Spies, RN, Janalyn Rouse, RN, Keane Police, RN, and Hampton Abbot, MD.  Nadara Mode, LCSWA 10/01/2021 @ 4:45 PM

## 2021-10-01 NOTE — ED Notes (Signed)
Called [facility] to try to give report and RN is busy at this time. Left number for her to call me back when she is available.

## 2021-10-01 NOTE — ED Provider Notes (Signed)
Emergency Medicine Observation Re-evaluation Note  Paul Bray is a 56 y.o. male, seen on rounds today.  Pt initially presented to the ED for complaints of Suicidal and Hallucinations Currently, the patient is sleeping.  Physical Exam  BP 96/67 (BP Location: Left Arm)   Pulse 67   Temp (!) 97.4 F (36.3 C) (Oral)   Resp 18   Ht '5\' 10"'$  (1.778 m)   Wt 88.5 kg   SpO2 98%   BMI 27.98 kg/m  Physical Exam General: No distress Cardiac: Perfused Lungs: Clear lungs Psych: Deferred  ED Course / MDM  EKG:EKG Interpretation  Date/Time:  Saturday September 27 2021 17:10:43 EDT Ventricular Rate:  130 PR Interval:  150 QRS Duration: 86 QT Interval:  306 QTC Calculation: 450 R Axis:   -4 Text Interpretation: Sinus tachycardia Confirmed by Lacretia Leigh ([id number]) on 09/27/2021 5:18:54 PM  I have reviewed the labs performed to date as well as medications administered while in observation.  Recent changes in the last 24 hours include no changes.  Plan  Current plan is for awaiting placement.  ALBAN MARUCCI is not under involuntary commitment.     Ezequiel Essex, MD 10/01/21 [phone]

## 2021-10-01 NOTE — Progress Notes (Signed)
Patient has been denied by [facility] due to no appropriate beds available. Patient meets [facility] inpatient criteria per Charmaine Downs, NP. Patient has been faxed out to the following facilities:   [facility]  [address] [place] [address] [phone]  [place] [address] [place] [address] [phone]  [facility]  [address], [place] [address] [place]  [facility]  [address] [place] [address] [phone] [place]  [place], [place] [address] [phone]  [facility]  [address] [place], [place] [address] [phone] [facility]  [place] [address] [place] [address] [phone]  [facility]  [address] [place] [address] [phone]  [facility]  [address], [place] [address] [phone] [place]  [address], [place] [address] [phone] [facility]  [address] [place] [address] [phone]  [facility]  [address] [place] [address] [phone] [facility]  [address], [place] [address] [phone]  [facility]  [address] [place] [address] [place]   Mariea Clonts, MSW, LCSW-A  3:13 PM 10/01/2021

## 2021-10-01 NOTE — ED Notes (Signed)
Tried to call report to [facility] Adult unit to RN. Unable to speak to RN at this time. Will continue to try to call report.

## 2021-10-01 NOTE — ED Notes (Addendum)
Gave report to Larene Beach, Therapist, sports, at [facility]. She asked to please send patient after midnight. Left number in case she had additional questions.   Calling Sheriff now to see if they can transport after midnight.

## 2021-10-02 ENCOUNTER — Inpatient Hospital Stay (HOSPITAL_COMMUNITY)
Admission: AD | Admit: 2021-10-02 | Discharge: 2021-10-13 | DRG: 885 | Disposition: A | Discharge status: Home / Self Care | Payer: Medicare Other | Source: Intra-hospital | Attending: Psychiatry | Admitting: Psychiatry

## 2021-10-02 ENCOUNTER — Ambulatory Visit (HOSPITAL_COMMUNITY): Payer: Medicare Other | Admitting: Medical

## 2021-10-02 ENCOUNTER — Other Ambulatory Visit: Payer: Self-pay

## 2021-10-02 ENCOUNTER — Encounter (HOSPITAL_COMMUNITY): Payer: Self-pay | Admitting: Adult Health

## 2021-10-02 DIAGNOSIS — Z923 Personal history of irradiation: Secondary | ICD-10-CM

## 2021-10-02 DIAGNOSIS — Z85841 Personal history of malignant neoplasm of brain: Secondary | ICD-10-CM | POA: Diagnosis not present

## 2021-10-02 DIAGNOSIS — F909 Attention-deficit hyperactivity disorder, unspecified type: Secondary | ICD-10-CM | POA: Diagnosis present

## 2021-10-02 DIAGNOSIS — F203 Undifferentiated schizophrenia: Principal | ICD-10-CM

## 2021-10-02 DIAGNOSIS — Z87891 Personal history of nicotine dependence: Secondary | ICD-10-CM

## 2021-10-02 DIAGNOSIS — F411 Generalized anxiety disorder: Secondary | ICD-10-CM | POA: Diagnosis present

## 2021-10-02 DIAGNOSIS — F1190 Opioid use, unspecified, uncomplicated: Secondary | ICD-10-CM

## 2021-10-02 DIAGNOSIS — F17200 Nicotine dependence, unspecified, uncomplicated: Secondary | ICD-10-CM

## 2021-10-02 DIAGNOSIS — F200 Paranoid schizophrenia: Secondary | ICD-10-CM | POA: Diagnosis present

## 2021-10-02 DIAGNOSIS — G4700 Insomnia, unspecified: Secondary | ICD-10-CM | POA: Diagnosis present

## 2021-10-02 DIAGNOSIS — E039 Hypothyroidism, unspecified: Secondary | ICD-10-CM | POA: Diagnosis present

## 2021-10-02 DIAGNOSIS — Z20822 Contact with and (suspected) exposure to covid-19: Secondary | ICD-10-CM | POA: Diagnosis present

## 2021-10-02 DIAGNOSIS — Z888 Allergy status to other drugs, medicaments and biological substances status: Secondary | ICD-10-CM | POA: Diagnosis not present

## 2021-10-02 DIAGNOSIS — Z79890 Hormone replacement therapy (postmenopausal): Secondary | ICD-10-CM | POA: Diagnosis not present

## 2021-10-02 DIAGNOSIS — Z79899 Other long term (current) drug therapy: Secondary | ICD-10-CM

## 2021-10-02 DIAGNOSIS — F1590 Other stimulant use, unspecified, uncomplicated: Secondary | ICD-10-CM

## 2021-10-02 MED ORDER — GABAPENTIN 300 MG PO CAPS
300.0000 mg | ORAL_CAPSULE | Freq: Two times a day (BID) | ORAL | Status: DC
Start: 2021-10-02 — End: 2021-10-05
  Administered 2021-10-02 – 2021-10-05 (×7): 300 mg via ORAL
  Filled 2021-10-02 (×14): qty 1

## 2021-10-02 MED ORDER — NICOTINE 21 MG/24HR TD PT24
21.0000 mg | MEDICATED_PATCH | Freq: Every day | TRANSDERMAL | Status: DC
  Administered 2021-10-07: 21 mg via TRANSDERMAL
  Filled 2021-10-02 (×9): qty 1

## 2021-10-02 MED ORDER — PROPRANOLOL HCL 10 MG PO TABS
10.0000 mg | ORAL_TABLET | Freq: Three times a day (TID) | ORAL | Status: DC
  Administered 2021-10-02 – 2021-10-13 (×24): 10 mg via ORAL
  Filled 2021-10-02 (×40): qty 1

## 2021-10-02 MED ORDER — HYDROXYZINE HCL 10 MG PO TABS: 10.0000 mg | ORAL_TABLET | ORAL | Status: DC | PRN

## 2021-10-02 MED ORDER — PROPRANOLOL HCL 20 MG PO TABS
20.0000 mg | ORAL_TABLET | Freq: Three times a day (TID) | ORAL | Status: DC
  Administered 2021-10-02 (×2): 20 mg via ORAL
  Filled 2021-10-02 (×7): qty 1

## 2021-10-02 MED ORDER — OLANZAPINE 5 MG PO TABS
5.0000 mg | ORAL_TABLET | Freq: Every day | ORAL | Status: DC
  Administered 2021-10-02: 5 mg via ORAL
  Filled 2021-10-02 (×3): qty 1

## 2021-10-02 MED ORDER — OLANZAPINE 7.5 MG PO TABS
7.5000 mg | ORAL_TABLET | Freq: Every day | ORAL | Status: DC
  Administered 2021-10-03: 7.5 mg via ORAL
  Filled 2021-10-02 (×3): qty 1

## 2021-10-02 MED ORDER — ACETAMINOPHEN 500 MG PO TABS
500.0000 mg | ORAL_TABLET | Freq: Two times a day (BID) | ORAL | Status: DC | PRN
  Administered 2021-10-02 – 2021-10-04 (×5): 500 mg via ORAL
  Filled 2021-10-02 (×5): qty 1

## 2021-10-02 MED ORDER — HYDROXYZINE HCL 25 MG PO TABS
25.0000 mg | ORAL_TABLET | Freq: Three times a day (TID) | ORAL | Status: DC | PRN
  Administered 2021-10-02 – 2021-10-03 (×3): 25 mg via ORAL
  Filled 2021-10-02 (×3): qty 1

## 2021-10-02 MED ORDER — ARIPIPRAZOLE 10 MG PO TABS
20.0000 mg | ORAL_TABLET | Freq: Every day | ORAL | Status: DC
Start: 2021-10-02 — End: 2021-10-02
  Filled 2021-10-02: qty 2

## 2021-10-02 MED ORDER — POLYETHYLENE GLYCOL 3350 17 G PO PACK
17.0000 g | PACK | Freq: Every day | ORAL | Status: DC
  Administered 2021-10-02 – 2021-10-12 (×7): 17 g via ORAL
  Filled 2021-10-02 (×15): qty 1

## 2021-10-02 MED ORDER — THYROID 60 MG PO TABS
90.0000 mg | ORAL_TABLET | Freq: Every day | ORAL | Status: DC
  Administered 2021-10-02 – 2021-10-13 (×12): 90 mg via ORAL
  Filled 2021-10-02 (×15): qty 1

## 2021-10-02 MED ORDER — OLANZAPINE 2.5 MG PO TABS
2.5000 mg | ORAL_TABLET | Freq: Every day | ORAL | Status: DC
  Administered 2021-10-03 – 2021-10-05 (×3): 2.5 mg via ORAL
  Filled 2021-10-02 (×7): qty 1

## 2021-10-02 MED ORDER — OLANZAPINE 7.5 MG PO TABS: 7.5000 mg | ORAL_TABLET | Freq: Every day | ORAL | Status: DC

## 2021-10-02 MED ORDER — TRAZODONE HCL 50 MG PO TABS
50.0000 mg | ORAL_TABLET | Freq: Every evening | ORAL | Status: DC | PRN
  Administered 2021-10-02: 50 mg via ORAL
  Filled 2021-10-02: qty 1

## 2021-10-02 MED ORDER — BUPRENORPHINE HCL 8 MG SL SUBL
8.0000 mg | SUBLINGUAL_TABLET | Freq: Three times a day (TID) | SUBLINGUAL | Status: DC
  Administered 2021-10-02 – 2021-10-13 (×36): 8 mg via SUBLINGUAL
  Filled 2021-10-02 (×36): qty 1

## 2021-10-02 MED ORDER — TAMSULOSIN HCL 0.4 MG PO CAPS
0.4000 mg | ORAL_CAPSULE | Freq: Every day | ORAL | Status: DC
Start: 2021-10-02 — End: 2021-10-08
  Administered 2021-10-02 – 2021-10-07 (×6): 0.4 mg via ORAL
  Filled 2021-10-02 (×7): qty 1

## 2021-10-02 MED ORDER — NICOTINE POLACRILEX 2 MG MT GUM
2.0000 mg | CHEWING_GUM | OROMUCOSAL | Status: DC | PRN
  Administered 2021-10-02 – 2021-10-13 (×45): 2 mg via ORAL
  Filled 2021-10-02 (×8): qty 1
  Filled 2021-10-02: qty 5
  Filled 2021-10-02 (×19): qty 1

## 2021-10-02 MED ORDER — TRAZODONE HCL 50 MG PO TABS
50.0000 mg | ORAL_TABLET | Freq: Every day | ORAL | Status: DC
  Filled 2021-10-02 (×2): qty 1
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
## 2021-10-02 NOTE — Progress Notes (Signed)
[facility] Group Notes:  (Nursing/MHT/Case Management/Adjunct)  Date:  10/02/2021  Time:  2045  Type of Therapy:   wrap up group  Participation Level:  Active  Participation Quality:  Appropriate, Attentive, Sharing, and Supportive  Affect:  Blunted  Cognitive:  Alert  Insight:  Improving  Engagement in Group:  Developing/Improving  Modes of Intervention:  Clarification, Education, and Socialization  Summary of Progress/Problems: Positive thinking and self-care were discussed.   Winfield Rast S 10/02/2021, 10:12 PM
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
## 2021-10-02 NOTE — [facility] Suicide Risk Assessment (Addendum)
[place] INPATIENT:  Family/Significant Other Suicide Prevention Education  Suicide Prevention Education:  Education Completed; Paul Bray, mother has been identified by the patient as the family member/significant other with whom the patient will be residing, and identified as the person(s) who will aid the patient in the event of a mental health crisis (suicidal ideations/suicide attempt).  With written consent from the patient, the family member/significant other has been provided the following suicide prevention education, prior to the and/or following the discharge of the patient.  In addition to the precautions below, patient's support agrees to monitor patient for safety, ensure treatment compliance, and alert emergency services should patient require such services.   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 States he lives separate from her and keeps his medications in his bed and "takes the first thing he sees."   States she went to his home and removed all medications she could find to include doxycycline, gabapentin, nicotine lozenge (L244), buprenorphine, and buspirone HCL, topiramate '100mg'$ , benztropine, benztropine, mesylate, risperidone.   The suicide prevention education provided includes the following: Suicide risk factors Suicide prevention and interventions National Suicide Hotline telephone number [facility] assessment telephone number [facility] Emergency Assistance [place] and/or Residential Mobile Crisis Unit telephone number  Request made of family/significant other to: Remove weapons (e.g., guns, rifles, knives), all items previously/currently identified as safety concern.   Remove drugs/medications (over-the-counter, prescriptions, illicit drugs), all items previously/currently identified as a safety concern.  The family member/significant other verbalizes understanding of the suicide  prevention education information provided.  The family member/significant other agrees to remove the items of safety concern listed above.  Durenda Hurt 10/02/2021, 1:36 PM

## 2021-10-02 NOTE — Progress Notes (Addendum)
Pharmacy:  Buprenorphine taper recommendations  Pt currently on 24 mg of buprenorphine  Referred to Buprenorphine tapering schedule and illicit opoid use article that looked at 7 day taper versus 28 day taper which concluded that there was no advantage prolonging therapy to 28 days.  Based on this if pt chooses to taper  Day 1 24 mg  ( 8 mg tid) Day 2 '20mg'$    (8 mg am, 1pm and '4mg'$   6pm)  Day 3 '16mg'$     (8 mg am and 5pm)) Day 4 '12mg'$     (8 mg am '4mg'$  at 5pm) Day 5 '8mg'$       ('4mg'$  am and 5pm)  Day 6  '4mg'$       ('4mg'$  am ) Day 7 2 mg     (2 mg am  Then stop     Einar Grad Pharm D

## 2021-10-02 NOTE — Progress Notes (Signed)
   10/02/21 1100  Psych Admission Type (Psych Patients Only)  Admission Status Involuntary  Psychosocial Assessment  Patient Complaints None  Thought Process  Coherency WDL  Content WDL  Delusions WDL  Perception WDL  Hallucination None reported or observed  Judgment WDL  Confusion WDL  Danger to Self  Current suicidal ideation? Denies  Danger to Others  Danger to Others None reported or observed

## 2021-10-02 NOTE — Progress Notes (Signed)
Admission Note:  D:  56  yr old male who presents to [facility]  in no acute distress for the treatment of SI. Pt presented anxious but cooperative with appropriate affect. Pt's speech is clear and thought processes are easy to follow and fully cooperated with initial nursing assessment. Pt appears to be guarded and thought-blocking at times. Pt endorses SI and AVH but denies, VH and pain at this time. Pt verbally contracts for safety at this time to this Probation officer. Pt's perception of admission is he is hear to gain coping skills and help with medication compliance. Per the pt he has a history of depression, use of ecstasy and Auditory hallucinations of many voices.               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 He acknowledged support being his mother.   A: Pt admitted to unit per protocol, skin assessment and search done and no contraband found.  Pt had surgical scar on L shoulder and shin, also tattoo on R-shoulder. Pt  educated on therapeutic milieu rules. Pt was introduced to milieu by nursing staff.  Unit policies were explained and pt verbalized understanding. All necessary consents obtained. Food and fluids offered, and  accepted. Pt had no additional questions or concerns.   R: Pt was receptive to education about the milieu . 15 min safety checks started. Support was offered by this Probation officer.

## 2021-10-02 NOTE — Progress Notes (Signed)
  [place] Group Notes: (Nursing/MHT/Case Management/Adjunct)   Date:  10/02/21  Time: 1400   Type of Therapy:   Goals Group   Participation Level:  Did not participate   Participation Quality:  Did not participate   Affect:  N/A   Cognitive:  N/A   Insight:  N/A   Engagement in Group:  N/A   Modes of Intervention:  N/A   Summary of Progress/Problems: Patient did not attend group but was offered the opportunity  [employer] RN  10/02/2021

## 2021-10-03 ENCOUNTER — Encounter (HOSPITAL_COMMUNITY): Payer: Self-pay

## 2021-10-03 ENCOUNTER — Encounter (HOSPITAL_COMMUNITY): Payer: Self-pay | Admitting: Adult Health

## 2021-10-03 DIAGNOSIS — F203 Undifferentiated schizophrenia: Secondary | ICD-10-CM | POA: Diagnosis not present

## 2021-10-03 LAB — COMPREHENSIVE METABOLIC PANEL
ALT: 32 U/L (ref 0–44)
AST: 20 U/L (ref 15–41)
Albumin: 3.9 g/dL (ref 3.5–5.0)
Alkaline Phosphatase: 67 U/L (ref 38–126)
Anion gap: 6 (ref 5–15)
BUN: 29 mg/dL — ABNORMAL HIGH (ref 6–20)
CO2: 28 mmol/L (ref 22–32)
Calcium: 9 mg/dL (ref 8.9–10.3)
Chloride: 109 mmol/L (ref 98–111)
Creatinine, Ser: 1.13 mg/dL (ref 0.61–1.24)
GFR, Estimated: >60 mL/min (ref >60)
Glucose, Bld: 87 mg/dL (ref 70–99)
Potassium: 4.5 mmol/L (ref 3.5–5.1)
Sodium: 143 mmol/L (ref 135–145)
Total Bilirubin: 0.7 mg/dL (ref 0.3–1.2)
Total Protein: 6.6 g/dL (ref 6.5–8.1)

## 2021-10-03 LAB — HEMOGLOBIN A1C
Hgb A1c MFr Bld: 5 % (ref 4.8–5.6)
Mean Plasma Glucose: 96.8 mg/dL

## 2021-10-03 LAB — LIPID PANEL
Cholesterol: 191 mg/dL (ref 0–200)
HDL: 48 mg/dL (ref >40)
LDL Cholesterol: 125 mg/dL — ABNORMAL HIGH (ref 0–99)
Total CHOL/HDL Ratio: 4 RATIO
Triglycerides: 92 mg/dL (ref <150)
VLDL: 18 mg/dL (ref 0–40)

## 2021-10-03 MED ORDER — TRAZODONE HCL 50 MG PO TABS
50.0000 mg | ORAL_TABLET | Freq: Every day | ORAL | Status: DC
  Administered 2021-10-03: 50 mg via ORAL
  Filled 2021-10-03 (×4): qty 1

## 2021-10-03 MED ORDER — TRAZODONE HCL 50 MG PO TABS
50.0000 mg | ORAL_TABLET | Freq: Once | ORAL | Status: AC
  Administered 2021-10-03: 50 mg via ORAL
  Filled 2021-10-03: qty 1

## 2021-10-03 NOTE — Group Note (Signed)
LCSW Group Therapy Note   Group Date: 10/03/2021 Start Time: 1300 End Time: 1400  Type of Therapy and Topic:  Group Therapy - Healthy vs Unhealthy Coping Skills  Participation Level:  Did Not Attend   Description of Group The focus of this group was to determine what unhealthy coping techniques typically are used by group members and what healthy coping techniques would be helpful in coping with various problems. Patients were guided in becoming aware of the differences between healthy and unhealthy coping techniques. Patients were asked to identify 2-3 healthy coping skills they would like to learn to use more effectively.  Therapeutic Goals Patients learned that coping is what human beings do all day long to deal with various situations in their lives Patients defined and discussed healthy vs unhealthy coping techniques Patients identified their preferred coping techniques and identified whether these were healthy or unhealthy Patients determined 2-3 healthy coping skills they would like to become more familiar with and use more often. Patients provided support and ideas to each other   Summary of Patient Progress:  Did not attend   Therapeutic Modalities Cognitive Behavioral Therapy Motivational Interviewing  Darleen Crocker, [place] 10/03/2021  1:46 PM

## 2021-10-03 NOTE — Progress Notes (Signed)
Pt attended AA meeting.  

## 2021-10-03 NOTE — BH IP Treatment Plan (Signed)
Interdisciplinary Treatment and Diagnostic Plan Update  10/03/2021 Time of Session: 10:05am LEXUS BARLETTA MRN: [id number]  Principal Diagnosis: Undifferentiated schizophrenia [facility])  Secondary Diagnoses: Principal Problem:   Undifferentiated schizophrenia ([place]) Active Problems:   Paranoid schizophrenia ([place])   Current Medications:  Current Facility-Administered Medications  Medication Dose Route Frequency Provider Last Rate Last Admin   acetaminophen (TYLENOL) tablet 500 mg  500 mg Oral BID PRN Merlyn Lot E, NP   500 mg at 10/03/21 0809   buprenorphine (SUBUTEX) sublingual tablet 8 mg  8 mg Sublingual TID Merlyn Lot E, NP   8 mg at 10/03/21 0277   gabapentin (NEURONTIN) capsule 300 mg  300 mg Oral BID Merlyn Lot E, NP   300 mg at 10/03/21 4128   hydrOXYzine (ATARAX) tablet 25 mg  25 mg Oral TID PRN Lindell Spar I, NP   25 mg at 10/03/21 0810   nicotine (NICODERM CQ - dosed in mg/24 hours) patch 21 mg  21 mg Transdermal Daily Merlyn Lot E, NP       nicotine polacrilex (NICORETTE) gum 2 mg  2 mg Oral PRN Merlyn Lot E, NP   2 mg at 10/03/21 0810   OLANZapine (ZYPREXA) tablet 2.5 mg  2.5 mg Oral Daily Lindell Spar I, NP   2.5 mg at 10/03/21 0807   OLANZapine (ZYPREXA) tablet 5 mg  5 mg Oral QHS Nwoko, Herbert Pun I, NP   5 mg at 10/02/21 2056   OLANZapine (ZYPREXA) tablet 7.5 mg  7.5 mg Oral QHS Nwoko, Agnes I, NP       polyethylene glycol (MIRALAX / GLYCOLAX) packet 17 g  17 g Oral Daily Merlyn Lot E, NP   17 g at 10/03/21 0810   propranolol (INDERAL) tablet 10 mg  10 mg Oral TID Lindell Spar I, NP   10 mg at 10/02/21 2056   tamsulosin (FLOMAX) capsule 0.4 mg  0.4 mg Oral QPC supper Merlyn Lot E, NP   0.4 mg at 10/02/21 1805   thyroid (ARMOUR) tablet 90 mg  90 mg Oral Daily Merlyn Lot E, NP   90 mg at 10/03/21 7867   traZODone (DESYREL) tablet 50 mg  50 mg Oral QHS PRN Lindell Spar I, NP   50 mg at 10/02/21 2057   PTA Medications: Medications Prior to Admission   Medication Sig Dispense Refill Last Dose   acetaminophen (TYLENOL) 500 MG tablet Take 2,000 mg by mouth 2 (two) times daily as needed for headache (pain).       ARIPiprazole (ABILIFY) 20 MG tablet Take 20 mg by mouth at bedtime as needed.      buprenorphine (SUBUTEX) 8 MG SUBL SL tablet Place 8 mg under the tongue 3 (three) times daily.      Fluocinolone Acetonide 0.01 % OIL Apply 3 to 5 drops to affected area of ears once to twice daily as needed.      gabapentin (NEURONTIN) 300 MG capsule Take 300 mg by mouth 2 (two) times daily as needed (sleep).      phentermine 37.5 MG capsule Take 1 capsule (37.5 mg total) by mouth every morning. 30 capsule 0    propranolol (INDERAL) 20 MG tablet Take 1 tablet by mouth 3 (three) times daily as needed.      thyroid (ARMOUR THYROID) 90 MG tablet Take 1 tablet (90 mg total) by mouth daily. 30 tablet 2     Patient Stressors:    Patient Strengths:    Treatment Modalities: Medication Management, Group therapy,  Case management,  1 to 1 session with clinician, Psychoeducation, Recreational therapy.   Physician Treatment Plan for Primary Diagnosis: Undifferentiated schizophrenia ([place]) Long Term Goal(s): Improvement in symptoms so as ready for discharge   Short Term Goals: Ability to identify and develop effective coping behaviors will improve Ability to maintain clinical measurements within normal limits will improve Compliance with prescribed medications will improve Ability to identify triggers associated with substance abuse/mental health issues will improve Ability to identify changes in lifestyle to reduce recurrence of condition will improve Ability to verbalize feelings will improve Ability to disclose and discuss suicidal ideas Ability to demonstrate self-control will improve  Medication Management: Evaluate patient's response, side effects, and tolerance of medication regimen.  Therapeutic Interventions: 1 to 1 sessions, Unit Group sessions  and Medication administration.  Evaluation of Outcomes: Not Met  Physician Treatment Plan for Secondary Diagnosis: Principal Problem:   Undifferentiated schizophrenia ([place]) Active Problems:   Paranoid schizophrenia ([place])  Long Term Goal(s): Improvement in symptoms so as ready for discharge   Short Term Goals: Ability to identify and develop effective coping behaviors will improve Ability to maintain clinical measurements within normal limits will improve Compliance with prescribed medications will improve Ability to identify triggers associated with substance abuse/mental health issues will improve Ability to identify changes in lifestyle to reduce recurrence of condition will improve Ability to verbalize feelings will improve Ability to disclose and discuss suicidal ideas Ability to demonstrate self-control will improve     Medication Management: Evaluate patient's response, side effects, and tolerance of medication regimen.  Therapeutic Interventions: 1 to 1 sessions, Unit Group sessions and Medication administration.  Evaluation of Outcomes: Not Met   RN Treatment Plan for Primary Diagnosis: Undifferentiated schizophrenia ([place]) Long Term Goal(s): Knowledge of disease and therapeutic regimen to maintain health will improve  Short Term Goals: Ability to remain free from injury will improve, Ability to verbalize frustration and anger appropriately will improve, Ability to demonstrate self-control, Ability to participate in decision making will improve, Ability to verbalize feelings will improve, Ability to disclose and discuss suicidal ideas, Ability to identify and develop effective coping behaviors will improve, and Compliance with prescribed medications will improve  Medication Management: RN will administer medications as ordered by provider, will assess and evaluate patient's response and provide education to patient for prescribed medication. RN will report any adverse and/or  side effects to prescribing provider.  Therapeutic Interventions: 1 on 1 counseling sessions, Psychoeducation, Medication administration, Evaluate responses to treatment, Monitor vital signs and CBGs as ordered, Perform/monitor CIWA, COWS, AIMS and Fall Risk screenings as ordered, Perform wound care treatments as ordered.  Evaluation of Outcomes: Not Met   LCSW Treatment Plan for Primary Diagnosis: Undifferentiated schizophrenia ([place]) Long Term Goal(s): Safe transition to appropriate next level of care at discharge, Engage patient in therapeutic group addressing interpersonal concerns.  Short Term Goals: Engage patient in aftercare planning with referrals and resources, Increase social support, Increase ability to appropriately verbalize feelings, Increase emotional regulation, Facilitate acceptance of mental health diagnosis and concerns, Facilitate patient progression through stages of change regarding substance use diagnoses and concerns, Identify triggers associated with mental health/substance abuse issues, and Increase skills for wellness and recovery  Therapeutic Interventions: Assess for all discharge needs, 1 to 1 time with Social worker, Explore available resources and support systems, Assess for adequacy in community support network, Educate family and significant other(s) on suicide prevention, Complete Psychosocial Assessment, Interpersonal group therapy.  Evaluation of Outcomes: Not Met   Progress in Treatment:  Attending groups: No. Participating in groups: No. Taking medication as prescribed: No. Toleration medication: No. Family/Significant other contact made: Yes, individual(s) contacted:  Keane Scrape Patient understands diagnosis: Yes. Discussing patient identified problems/goals with staff: Yes. Medical problems stabilized or resolved: Yes. Denies suicidal/homicidal ideation: Yes. Issues/concerns per patient self-inventory: No. Other: none reported   New problem(s)  identified: No, Describe:  none reported   New Short Term/Long Term Goal(s): detox, medication management for mood stabilization; elimination of SI thoughts; development of comprehensive mental wellness/sobriety plan      Patient Goals: patient states, " I want to stop hearing voices"  Discharge Plan or Barriers: Patient recently admitted. CSW will continue to follow and assess for appropriate referrals and possible discharge planning.    Reason for Continuation of Hospitalization: Anxiety Depression Medication stabilization Withdrawal symptoms  Estimated Length of Stay: 3-5 days   Last 3 [place] Suicide Severity Risk Score: [place] Admission (Current) from 10/02/2021 in [facility] ED from 09/27/2021 in [place] DEPT  C-SSRS RISK CATEGORY High Risk High Risk       Last PHQ 2/9 Scores:     No data to display          Scribe for Treatment Team: Zachery Conch, LCSW 10/03/2021 11:46 AM

## 2021-10-03 NOTE — Progress Notes (Addendum)
   10/03/21 2209  Psych Admission Type (Psych Patients Only)  Admission Status Involuntary  Psychosocial Assessment  Patient Complaints Depression;Anxiety  Eye Contact Brief  Facial Expression Flat  Affect Sad;Flat  Speech Logical/coherent  Interaction Guarded  Appearance/Hygiene Unremarkable  Behavior Characteristics Cooperative  Mood Depressed;Pleasant  Thought Process  Coherency WDL  Content WDL  Delusions None reported or observed  Perception WDL  Hallucination None reported or observed  Judgment WDL  Confusion None  Danger to Self  Current suicidal ideation? Denies   Patient has been up in the dayroom watching tv and attended group tonight. He c/o headache and received tylenol he reports that he keeps a headache d/t a brain tumor and the doctor has told him because of where its located he can't operate on it. Support given and safety maintained on unit with 15 min checks

## 2021-10-03 NOTE — Progress Notes (Signed)
Pt denies SI/HI/AVH and verbally agrees to approach staff if these become apparent or before harming themselves/others. Rates depression 5/10. Rates anxiety 9/10. Rates pain 7/10. Pt has been in his room for the entire day. Pt stated that he was just really fatigue and it is due to his brain tumor. Pt came to the dayroom for a little bit this evening. Scheduled medications administered to pt, per MD orders. RN provided support and encouragement to pt. Q15 min safety checks implemented and continued. Pt safe on the unit. RN will continue to monitor and intervene as needed.   10/03/21 0810  Psych Admission Type (Psych Patients Only)  Admission Status Involuntary  Psychosocial Assessment  Patient Complaints Anxiety;Depression  Eye Contact Brief  Facial Expression Anxious;Sad  Affect Anxious;Flat  Speech Logical/coherent  Interaction Forwards little;Guarded;Isolative  Motor Activity Tremors  Appearance/Hygiene Production designer, theatre/television/film Cooperative;Anxious;Guarded  Mood Depressed;Anxious;Pleasant  Thought Process  Coherency WDL  Content WDL  Delusions None reported or observed  Perception WDL  Hallucination None reported or observed  Judgment Limited  Confusion None  Danger to Self  Current suicidal ideation? Denies  Danger to Others  Danger to Others None reported or observed

## 2021-10-03 NOTE — Plan of Care (Signed)
  Problem: Education: Goal: Knowledge of General Education information will improve Description: Including pain rating scale, medication(s)/side effects and non-pharmacologic comfort measures Outcome: Progressing   Problem: Health Behavior/Discharge Planning: Goal: Ability to manage health-related needs will improve Outcome: Progressing   Problem: Coping: Goal: Level of anxiety will decrease Outcome: Progressing  Patient compliant with medications Denies SI/HI/A/VH and contracted for safety support and encouragement provided,

## 2021-10-03 NOTE — Progress Notes (Signed)
[facility] MD Progress Note  10/03/2021 4:03 PM Paul Bray  MRN:  [id number]  Subjective:  Paul Bray reports, "I'm doing okay, I just showered. No auditory hallucinations this morning".  Reason for admission: 56 y.o. Caucasian  male patient admitted with hx significant for schizophrenia, paranoid personality, delusional disorder, severe opioid dependence, alcohol use disorder, bipolar disorder, substance induced mood disorder.  He is being seen by  Tonita Phoenix at [facility] outpatient Psychiatric clinic for his mental health care.  Patient was seen resting in the room.  He engaged in meaningful conversation.  He stated that he came to the ER to get off Subutex in a controlled environment.  He also admitted to Opiate abuse as well.  Patient reported feeling suicidal off and on but not at this time.  He, however reported constantly feeling paranoid that something bad is going to happen to him or his mom, some people are "messing with my medications"    Daily notes: Paul Bray is seen in his room. Dr. Dwyane Dee was present. He is lying down in bed, awake. He making a minimal eye contact. He is verbally responsive, but not saying much. He says he has been up & have had his shower already. He says he did not sleep well last night. He denies auditory hallucinations today. Says the last time he heard a voice was yesterday. Patient reported while at the ED that he came to the hospital to safely get off Suboxone. However, when he arrived at the [facility], his main complaint was auditory hallucinations. He also indicated getting off Suboxone as he does not want to have to worry about the medicine being stolen. However, when the pharmacist met with him to discuss tapering off the Suboxone, patient changed his mind & decided to continue the medication for his opioid use disorder. At this time, patient spends most of his time in bed in his room. He is neither attending or participating in the group sessions. He is currently taking &  tolerating his treatment regimen, denies any side effects. He currently denies any SIHI, AVH, delusional thoughts or paranoia. He does not appear to be responding to any internal stimuli. Reviewed current lab results, Hgba1c 5.0. The lipid panel & CMP pending. Vital signs remain stable.  Principal Problem: Undifferentiated schizophrenia ([place])  Diagnosis: Principal Problem:   Undifferentiated schizophrenia ([place]) Active Problems:   Paranoid schizophrenia ([place])  Total Time spent with patient:  35 minutes  Past Psychiatric History: See H&P  Past Medical History:  Past Medical History:  Diagnosis Date   ADHD    Anxiety    Brain tumor ([place])    balance and occ memory issues and headaches   Brain tumor ([place])    sees [facility] q year   Depression    Headache    migraine and tension   Hypothyroidism    Soft tissue mass    left shoulder   Substance abuse ([place])     Past Surgical History:  Procedure Laterality Date   colonscopy     gamma knife radiation 2018 for brain tumor'     hematoma removed from arm Left    MASS EXCISION Left 06/30/2019   Procedure: EXCISION OF SOFT TISSUE  MASS LEFT SHOULDER;  Surgeon: Armandina Gemma, MD;  Location: [place];  Service: General;  Laterality: Left;  LMA   Family History: History reviewed. No pertinent family history.  Family Psychiatric  History: See H&P  Social History:  Social History   Substance and  Sexual Activity  Alcohol Use Not Currently   Comment: quit Oct 2019     Social History   Substance and Sexual Activity  Drug Use Not Currently   Types: Cocaine, IV, Heroin, MDMA (Ecstacy)   Comment: last reported use; cocaine and heroin in 2019; MDMA Feb 2023    Social History   Socioeconomic History   Marital status: Single    Spouse name: Not on file   Number of children: Not on file   Years of education: Not on file   Highest education level: Not on file  Occupational History   Not on file  Tobacco Use    Smoking status: Former    Types: Cigarettes   Smokeless tobacco: Current   Tobacco comments:    quit jan 2020  Vaping Use   Vaping Use: Never used  Substance and Sexual Activity   Alcohol use: Not Currently    Comment: quit Oct 2019   Drug use: Not Currently    Types: Cocaine, IV, Heroin, MDMA (Ecstacy)    Comment: last reported use; cocaine and heroin in 2019; MDMA Feb 2023   Sexual activity: Not Currently  Other Topics Concern   Not on file  Social History Narrative   Not on file   Social Determinants of Health   Financial Resource Strain: Not on file  Food Insecurity: Not on file  Transportation Needs: Not on file  Physical Activity: Not on file  Stress: Not on file  Social Connections: Not on file   Additional Social History:   Sleep: Good  Appetite:  Good  Current Medications: Current Facility-Administered Medications  Medication Dose Route Frequency Provider Last Rate Last Admin   acetaminophen (TYLENOL) tablet 500 mg  500 mg Oral BID PRN Merlyn Lot E, NP   500 mg at 10/03/21 0809   buprenorphine (SUBUTEX) sublingual tablet 8 mg  8 mg Sublingual TID Merlyn Lot E, NP   8 mg at 10/03/21 1434   gabapentin (NEURONTIN) capsule 300 mg  300 mg Oral BID Merlyn Lot E, NP   300 mg at 10/03/21 3267   hydrOXYzine (ATARAX) tablet 25 mg  25 mg Oral TID PRN Lindell Spar I, NP   25 mg at 10/03/21 0810   nicotine (NICODERM CQ - dosed in mg/24 hours) patch 21 mg  21 mg Transdermal Daily Merlyn Lot E, NP       nicotine polacrilex (NICORETTE) gum 2 mg  2 mg Oral PRN Merlyn Lot E, NP   2 mg at 10/03/21 0810   OLANZapine (ZYPREXA) tablet 2.5 mg  2.5 mg Oral Daily Lindell Spar I, NP   2.5 mg at 10/03/21 0807   OLANZapine (ZYPREXA) tablet 7.5 mg  7.5 mg Oral QHS Naraly Fritcher I, NP       polyethylene glycol (MIRALAX / GLYCOLAX) packet 17 g  17 g Oral Daily Merlyn Lot E, NP   17 g at 10/03/21 0810   propranolol (INDERAL) tablet 10 mg  10 mg Oral TID Lindell Spar I, NP   10  mg at 10/03/21 1343   tamsulosin (FLOMAX) capsule 0.4 mg  0.4 mg Oral QPC supper Merlyn Lot E, NP   0.4 mg at 10/02/21 1805   thyroid (ARMOUR) tablet 90 mg  90 mg Oral Daily Merlyn Lot E, NP   90 mg at 10/03/21 0808   traZODone (DESYREL) tablet 50 mg  50 mg Oral QHS PRN Lindell Spar I, NP   50 mg at 10/02/21 2057   Lab  Results:  Results for orders placed or performed during the hospital encounter of 10/02/21 (from the past 48 hour(s))  Hemoglobin A1c     Status: None   Collection Time: 10/03/21  6:18 AM  Result Value Ref Range   Hgb A1c MFr Bld 5.0 4.8 - 5.6 %    Comment: (NOTE) Pre diabetes:          5.7%-6.4%  Diabetes:              >6.4%  Glycemic control for   <7.0% adults with diabetes    Mean Plasma Glucose 96.8 mg/dL    Comment: Performed at [facility], [place] [address], [place], [place] [address]   Blood Alcohol level:  Lab Results  Component Value Date   ETH <10 09/27/2021   ETH 41 (H) 46/27/0350   Metabolic Disorder Labs: Lab Results  Component Value Date   HGBA1C 5.0 10/03/2021   MPG 96.8 10/03/2021   No results found for: "PROLACTIN" No results found for: "CHOL", "TRIG", "HDL", "CHOLHDL", "VLDL", "LDLCALC"  Physical Findings: AIMS: Facial and Oral Movements Muscles of Facial Expression: None, normal Lips and Perioral Area: None, normal Jaw: None, normal Tongue: None, normal,Extremity Movements Upper (arms, wrists, hands, fingers): None, normal Lower (legs, knees, ankles, toes): None, normal, Trunk Movements Neck, shoulders, hips: None, normal, Overall Severity Severity of abnormal movements (highest score from questions above): None, normal Incapacitation due to abnormal movements: None, normal Patient's awareness of abnormal movements (rate only patient's report): No Awareness, Dental Status Current problems with teeth and/or dentures?: No Does patient usually wear dentures?: No  CIWA:    COWS:     Musculoskeletal: Strength & Muscle  Tone: within normal limits Gait & Station: normal Patient leans: N/A  Psychiatric Specialty Exam:  Presentation  General Appearance: Bizarre (in that he looks down at the bed for most of the assessment)  Eye Contact:Minimal  Speech:Clear and Coherent  Speech Volume:Normal  Handedness:Right  Mood and Affect  Mood:Dysphoric; Depressed  Affect:Congruent; Flat  Thought Process  Thought Processes:Coherent  Descriptions of Associations:Intact  Orientation:Full (Time, Place and Person)  Thought Content:Logical (he has AH and has insight of this)  History of Schizophrenia/Schizoaffective disorder:Yes  Duration of Psychotic Symptoms:Greater than six months  Hallucinations:No data recorded Ideas of Reference:Paranoia  Suicidal Thoughts:No data recorded Homicidal Thoughts:No data recorded  Sensorium  Memory:Immediate Good; Recent Good; Remote Good  Judgment:Impaired  Insight:Fair  Executive Functions  Concentration:Fair  Attention Span:Fair  [place] of Knowledge:Good  Language:Good  Psychomotor Activity  Psychomotor Activity:No data recorded  Assets  Assets:Communication Skills; Housing; Desire for Improvement; Social Support  Sleep  Sleep:No data recorded  Physical Exam: Physical Exam Vitals and nursing note reviewed.  HENT:     Nose: Nose normal.     Mouth/Throat:     Pharynx: Oropharynx is clear.  Cardiovascular:     Rate and Rhythm: Normal rate.     Pulses: Normal pulses.  Pulmonary:     Effort: Pulmonary effort is normal.  Musculoskeletal:        General: Normal range of motion.     Cervical back: Normal range of motion.  Skin:    General: Skin is warm.  Neurological:     General: No focal deficit present.     Mental Status: He is alert and oriented to person, place, and time.   Review of Systems  Constitutional:  Negative for chills, diaphoresis and fever.  HENT:  Negative for congestion and sore throat.  Respiratory:   Negative for shortness of breath and wheezing.   Cardiovascular:  Negative for chest pain and palpitations.  Gastrointestinal:  Negative for abdominal pain, constipation, diarrhea, heartburn, nausea and vomiting.  Neurological:  Negative for dizziness, tingling, tremors, sensory change, speech change, focal weakness, seizures, loss of consciousness, weakness and headaches.  Psychiatric/Behavioral:  Positive for substance abuse. Negative for depression, hallucinations, memory loss and suicidal ideas. The patient has insomnia. The patient is not nervous/anxious.    Blood pressure 114/75, pulse 64, temperature 98 F (36.7 C), temperature source Oral, resp. rate 18, height 5' 10" (1.778 m), weight 88.5 kg, SpO2 100 %. Body mass index is 27.99 kg/m.  Treatment Plan Summary: Daily contact with patient to assess and evaluate symptoms and progress in treatment and Medication management.   Continue inpatient hospitalization.  Will continue today 10/03/2021 plan as below except where it is noted.    Undifferentiated Schizophrenia.   Plan. - Continue Olanzapine 2.5 mg po daily for psychosis. - Completed Olanzapine 5 mg po Q hs x 1 for psychosis.  - Continue Olanzapine 7.5 mg po Q bedtime (start 10-03-21). - Continue gabapentin 300 mg po bid for anxiety. - Continue Hydroxyzine 25 mg po tid prn for anxiety.  - Changed Trazodone 50 mg po Q hs routinely for insomnia.   - Continue Nicotine 21 mg trans-dermally Q 24 hrs for nicotine withdrawal. - Continue Nicorette gum 2 mg po prn for nicotine withdrawal prn.   Other medical issues. - Continue on Flomax  .04 mg po Q hs for prostate health. - Continue Thyroid (Armour) 90 mg po daily for hypothyroidism.  - Continue Miralax 17 gm po daily for constipation.   Other prn medications.  - acetaminophen 500 mg po bid prn for pain/fever. - Mylanta 30 ml po Q 6 hours prn for indigestion.  - MOM 30 ml po q daily prn for constipation.   Discharge  Planning: Social work and case management to assist with discharge planning and identification of hospital follow-up needs prior to discharge Estimated LOS: 5-7 days Discharge Concerns: Need to establish a safety plan; Medication compliance and effectiveness Discharge Goals: Return home with outpatient referrals for mental health follow-up including medication management/psychotherapy  Lindell Spar, NP, pmhnp, fnp-bc 10/03/2021, 4:03 PM

## 2021-10-04 DIAGNOSIS — F1590 Other stimulant use, unspecified, uncomplicated: Secondary | ICD-10-CM

## 2021-10-04 DIAGNOSIS — F1190 Opioid use, unspecified, uncomplicated: Secondary | ICD-10-CM

## 2021-10-04 DIAGNOSIS — F411 Generalized anxiety disorder: Secondary | ICD-10-CM

## 2021-10-04 DIAGNOSIS — F17200 Nicotine dependence, unspecified, uncomplicated: Secondary | ICD-10-CM

## 2021-10-04 DIAGNOSIS — G4700 Insomnia, unspecified: Secondary | ICD-10-CM

## 2021-10-04 MED ORDER — ZIPRASIDONE MESYLATE 20 MG IM SOLR: 20.0000 mg | INTRAMUSCULAR | Status: DC | PRN

## 2021-10-04 MED ORDER — LORAZEPAM 1 MG PO TABS: 1.0000 mg | ORAL_TABLET | ORAL | Status: DC | PRN

## 2021-10-04 MED ORDER — TRAZODONE HCL 100 MG PO TABS
100.0000 mg | ORAL_TABLET | Freq: Every day | ORAL | Status: DC
  Administered 2021-10-05 – 2021-10-12 (×8): 100 mg via ORAL
  Filled 2021-10-04 (×15): qty 1

## 2021-10-04 MED ORDER — OLANZAPINE 10 MG PO TABS
10.0000 mg | ORAL_TABLET | Freq: Every day | ORAL | Status: DC
  Administered 2021-10-04: 10 mg via ORAL
  Filled 2021-10-04 (×3): qty 1

## 2021-10-04 MED ORDER — OLANZAPINE 5 MG PO TBDP
5.0000 mg | ORAL_TABLET | Freq: Three times a day (TID) | ORAL | Status: DC | PRN
  Filled 2021-10-04: qty 1

## 2021-10-04 NOTE — [facility] Group Notes (Signed)
Goals Group 10/04/2021   Group Focus: affirmation, clarity of thought, and goals/reality orientation Treatment Modality:  Psychoeducation Interventions utilized were assignment, group exercise, and support Purpose: To be able to understand and verbalize the reason for their admission to the hospital. To understand that the medication helps with their chemical imbalance but they also need to work on their choices in life. To be challenged to develop a list of 30 positives about themselves. Also introduce the concept that "feelings" are not reality.  Participation Level:  Active  Participation Quality:  Appropriate  Affect:  Appropriate  Cognitive:  Appropriate  Insight:  Improving  Engagement in Group:  Engaged  Additional Comments:  .Pt rates his energy at a 5/10. Sat quietly in the group. Stated that he doesn't understand why he is here.  Paulino Rily

## 2021-10-04 NOTE — Group Note (Signed)
LCSW Group Therapy Note  10/04/2021    10:00-11:00am   Type of Therapy and Topic:  Group Therapy: Early Messages Received About Anger  Participation Level:  Minimal   Description of Group:   In this group, patients shared and discussed the early messages received in their lives about anger through parental or other adult modeling, teaching, repression, punishment, violence, and more.  Participants identified how those childhood lessons influence even now how they usually or often react when angered.  The group discussed that anger is a secondary emotion and what may be the underlying emotional themes that come out through anger outbursts or that are ignored through anger suppression.    Therapeutic Goals: Patients will identify one or more childhood message about anger that they received and how it was taught to them. Patients will discuss how these childhood experiences have influenced and continue to influence their own expression or repression of anger even today. Patients will explore possible primary emotions that tend to fuel their secondary emotion of anger. Patients will learn that anger itself is normal and cannot be eliminated, and that healthier coping skills can assist with resolving conflict rather than worsening situations.  Summary of Patient Progress:  The patient was late to group, did not talk but appeared to listen  Therapeutic Modalities:   Cognitive Behavioral Therapy Motivation Interviewing  Paul Bray  .

## 2021-10-04 NOTE — Progress Notes (Signed)
Patient attended group and watched tv briefly before going to his room to read. He fell off to sleep before taking his scheduled trazodone tonight. Patient is safe on the unit with 15 min checks being done.   10/04/21 2100  Psych Admission Type (Psych Patients Only)  Admission Status Involuntary  Psychosocial Assessment  Patient Complaints Worrying  Eye Contact Brief  Facial Expression Flat  Affect Sad;Flat  Speech Logical/coherent  Interaction Guarded  Motor Activity Other (Comment) (wnl)  Appearance/Hygiene Unremarkable  Behavior Characteristics Cooperative;Appropriate to situation  Mood Pleasant  Thought Process  Coherency WDL  Content WDL  Delusions None reported or observed  Perception WDL  Hallucination None reported or observed  Judgment WDL  Confusion None  Danger to Self  Current suicidal ideation? Denies

## 2021-10-04 NOTE — Progress Notes (Signed)
[facility] MD Progress Note  10/04/2021 3:28 PM Paul Bray  MRN:  [id number]  Subjective:  Paul Bray reports, "This is the first day I have been awake at this time of the day for a while. Whatever it is that y'all are giving me to keep me awake, I do not want you to take it away. But I still do not understand why I am not taking Phentermine or Provigil. That was given to me by my PA, and I need them."  Reason for admission: 56 y.o. Caucasian  male patient admitted with hx significant for schizophrenia, paranoid personality, delusional disorder, severe opioid dependence, alcohol use disorder, bipolar disorder, substance induced mood disorder.  Pt presented to the [facility] ED with complaints of worsening auditory & visual hallucinations, paranoia, and +SI, with a plan to "overmedicate" himself to death. He was on MAT (Suboxone) for Opioid abuse, & reported withdrawal symptoms due to noncompliance. Pt was involuntarily transferred to this [facility] for treatment and stabilization of his mental health symptoms.  Today's patient assessment Note: Pt's chart is reviewed, his case discussed with his treatment team. Pt is seen in his room on the 400 hall. Pt presents today with a flat affect and depressed mood. His attention to personal hygiene and grooming is poor & the need to tend to personal hygiene and grooming has been reinforced, and personal hygiene items supplied. Pt' eye contact is fair, speech is clear & coherent. Thought contents are organized, but with some illogical contents. Pt currently denies SI/HI, but endorses +AH of voices talking to him in his room. He states that the voices are stating "thank you", calling his name, stating that he ate too much food, amongst other random things. He reports paranoia, states that a nonspecific person is out to get him. He also presents today with some thought broadcasting and states that he feels as though his thoughts are so loud that other people know what he is  thinking about.  Pt reports a good appetite, but reports a poor sleep quality last night. He is currently on Trazodone 50 mg nightly. Will increase this to 100 mg nightly for sleep. He is on Zyprexa 7.5 mg nightly for psychosis. Will increase night dose of Zyprexa to 10 mg, and keep day time dose at 2.5 mg for now. Will continue other medications as scheduled. Pt complained that he was supposed to be on Phentermine and Provigil, and has been educated that since he complained of extreme sleepiness prior to this hospitalization, his medications are being adjusted so as to regulate his mood and to prevent excessive day time sleepiness. He is unable to provide the rationale as to why he is prescribed the Phentermine outpatient. Chart reviewed, but could not find this information. Stimulants were stopped this admission due to worsening psychosis.  Principal Problem: Undifferentiated schizophrenia ([place])  Diagnosis: Principal Problem:   Undifferentiated schizophrenia ([place]) Active Problems:   Paranoid schizophrenia ([place])   Insomnia   Opioid use disorder   Stimulant use disorder   Nicotine dependence   GAD (generalized anxiety disorder)  Total Time spent with patient:  35 minutes  Past Psychiatric History: See H&P  Past Medical History:  Past Medical History:  Diagnosis Date   ADHD    Anxiety    Brain tumor ([place])    balance and occ memory issues and headaches   Brain tumor ([place])    sees [facility] q year   Depression    Headache    migraine  and tension   Hypothyroidism    Soft tissue mass    left shoulder   Substance abuse [facility])     Past Surgical History:  Procedure Laterality Date   colonscopy     gamma knife radiation 2018 for brain tumor'     hematoma removed from arm Left    MASS EXCISION Left 06/30/2019   Procedure: EXCISION OF SOFT TISSUE  MASS LEFT SHOULDER;  Surgeon: Armandina Gemma, MD;  Location: [facility];  Service: General;  Laterality: Left;  LMA   Family  History: History reviewed. No pertinent family history.  Family Psychiatric  History: See H&P  Social History:  Social History   Substance and Sexual Activity  Alcohol Use Not Currently   Comment: quit Oct 2019     Social History   Substance and Sexual Activity  Drug Use Not Currently   Types: Cocaine, IV, Heroin, MDMA (Ecstacy)   Comment: last reported use; cocaine and heroin in 2019; MDMA Feb 2023    Social History   Socioeconomic History   Marital status: Single    Spouse name: Not on file   Number of children: Not on file   Years of education: Not on file   Highest education level: Not on file  Occupational History   Not on file  Tobacco Use   Smoking status: Former    Types: Cigarettes   Smokeless tobacco: Current   Tobacco comments:    quit jan 2020  Vaping Use   Vaping Use: Every day  Substance and Sexual Activity   Alcohol use: Not Currently    Comment: quit Oct 2019   Drug use: Not Currently    Types: Cocaine, IV, Heroin, MDMA (Ecstacy)    Comment: last reported use; cocaine and heroin in 2019; MDMA Feb 2023   Sexual activity: Not Currently  Other Topics Concern   Not on file  Social History Narrative   Not on file   Social Determinants of Health   Financial Resource Strain: Not on file  Food Insecurity: Not on file  Transportation Needs: Not on file  Physical Activity: Not on file  Stress: Not on file  Social Connections: Not on file   Additional Social History:   Sleep: Good  Appetite:  Good  Current Medications: Current Facility-Administered Medications  Medication Dose Route Frequency Provider Last Rate Last Admin   acetaminophen (TYLENOL) tablet 500 mg  500 mg Oral BID PRN Merlyn Lot E, NP   500 mg at 10/04/21 0826   buprenorphine (SUBUTEX) sublingual tablet 8 mg  8 mg Sublingual TID Merlyn Lot E, NP   8 mg at 10/04/21 1223   gabapentin (NEURONTIN) capsule 300 mg  300 mg Oral BID Merlyn Lot E, NP   300 mg at 10/04/21 5366    hydrOXYzine (ATARAX) tablet 25 mg  25 mg Oral TID PRN Lindell Spar I, NP   25 mg at 10/03/21 2114   OLANZapine zydis (ZYPREXA) disintegrating tablet 5 mg  5 mg Oral Q8H PRN Nicholes Rough, NP       And   LORazepam (ATIVAN) tablet 1 mg  1 mg Oral PRN Nicholes Rough, NP       And   ziprasidone (GEODON) injection 20 mg  20 mg Intramuscular PRN Jennie Hannay, NP       nicotine (NICODERM CQ - dosed in mg/24 hours) patch 21 mg  21 mg Transdermal Daily Merlyn Lot E, NP       nicotine polacrilex (NICORETTE)  gum 2 mg  2 mg Oral PRN Merlyn Lot E, NP   2 mg at 10/04/21 1224   OLANZapine (ZYPREXA) tablet 10 mg  10 mg Oral QHS Nicholes Rough, NP       OLANZapine (ZYPREXA) tablet 2.5 mg  2.5 mg Oral Daily Nwoko, Agnes I, NP   2.5 mg at 10/04/21 0826   polyethylene glycol (MIRALAX / GLYCOLAX) packet 17 g  17 g Oral Daily Merlyn Lot E, NP   17 g at 10/03/21 0810   propranolol (INDERAL) tablet 10 mg  10 mg Oral TID Lindell Spar I, NP   10 mg at 10/04/21 1417   tamsulosin (FLOMAX) capsule 0.4 mg  0.4 mg Oral QPC supper Merlyn Lot E, NP   0.4 mg at 10/03/21 1740   thyroid (ARMOUR) tablet 90 mg  90 mg Oral Daily Merlyn Lot E, NP   90 mg at 10/04/21 0827   traZODone (DESYREL) tablet 100 mg  100 mg Oral QHS Nicholes Rough, NP       Lab Results:  Results for orders placed or performed during the hospital encounter of 10/02/21 (from the past 48 hour(s))  Hemoglobin A1c     Status: None   Collection Time: 10/03/21  6:18 AM  Result Value Ref Range   Hgb A1c MFr Bld 5.0 4.8 - 5.6 %    Comment: (NOTE) Pre diabetes:          5.7%-6.4%  Diabetes:              >6.4%  Glycemic control for   <7.0% adults with diabetes    Mean Plasma Glucose 96.8 mg/dL    Comment: Performed at [facility], [place] [address] [place], [place] [address]  Comprehensive metabolic panel     Status: Abnormal   Collection Time: 10/03/21  6:26 PM  Result Value Ref Range   Sodium 143 135 - 145 mmol/L   Potassium 4.5 3.5  - 5.1 mmol/L   Chloride 109 98 - 111 mmol/L   CO2 28 22 - 32 mmol/L   Glucose, Bld 87 70 - 99 mg/dL    Comment: Glucose reference range applies only to samples taken after fasting for at least 8 hours.   BUN 29 (H) 6 - 20 mg/dL   Creatinine, Ser 1.13 0.61 - 1.24 mg/dL   Calcium 9.0 8.9 - 10.3 mg/dL   Total Protein 6.6 6.5 - 8.1 g/dL   Albumin 3.9 3.5 - 5.0 g/dL   AST 20 15 - 41 U/L   ALT 32 0 - 44 U/L   Alkaline Phosphatase 67 38 - 126 U/L   Total Bilirubin 0.7 0.3 - 1.2 mg/dL   GFR, Estimated >60 >60 mL/min    Comment: (NOTE) Calculated using the CKD-EPI Creatinine Equation (2021)    Anion gap 6 5 - 15    Comment: Performed at [facility], [place] [address], [place],  [address]  Lipid panel     Status: Abnormal   Collection Time: 10/03/21  6:26 PM  Result Value Ref Range   Cholesterol 191 0 - 200 mg/dL   Triglycerides 92 <150 mg/dL   HDL 48 >40 mg/dL   Total CHOL/HDL Ratio 4.0 RATIO   VLDL 18 0 - 40 mg/dL   LDL Cholesterol 125 (H) 0 - 99 mg/dL    Comment:        Total Cholesterol/HDL:CHD Risk Coronary Heart Disease Risk Table  Men   Women  1/2 Average Risk   3.4   3.3  Average Risk       5.0   4.4  2 X Average Risk   9.6   7.1  3 X Average Risk  23.4   11.0        Use the calculated Patient Ratio above and the CHD Risk Table to determine the patient's CHD Risk.        ATP III CLASSIFICATION (LDL):  <100     mg/dL   Optimal  100-129  mg/dL   Near or Above                    Optimal  130-159  mg/dL   Borderline  160-189  mg/dL   High  >190     mg/dL   Very High Performed at [facility] [address] [place], [place] [address]    Blood Alcohol level:  Lab Results  Component Value Date   ETH <10 09/27/2021   ETH 41 (H) 56/38/9373   Metabolic Disorder Labs: Lab Results  Component Value Date   HGBA1C 5.0 10/03/2021   MPG 96.8 10/03/2021   No results found for: "PROLACTIN" Lab Results   Component Value Date   CHOL 191 10/03/2021   TRIG 92 10/03/2021   HDL 48 10/03/2021   CHOLHDL 4.0 10/03/2021   VLDL 18 10/03/2021   LDLCALC 125 (H) 10/03/2021    Physical Findings: AIMS: Facial and Oral Movements Muscles of Facial Expression: None, normal Lips and Perioral Area: None, normal Jaw: None, normal Tongue: None, normal,Extremity Movements Upper (arms, wrists, hands, fingers): None, normal Lower (legs, knees, ankles, toes): None, normal, Trunk Movements Neck, shoulders, hips: None, normal, Overall Severity Severity of abnormal movements (highest score from questions above): None, normal Incapacitation due to abnormal movements: None, normal Patient's awareness of abnormal movements (rate only patient's report): No Awareness, Dental Status Current problems with teeth and/or dentures?: No Does patient usually wear dentures?: No  CIWA:    COWS:     Musculoskeletal: Strength & Muscle Tone: within normal limits Gait & Station: normal Patient leans: N/A  Psychiatric Specialty Exam:  Presentation  General Appearance: Disheveled  Eye Contact:Fair  Speech:Clear and Coherent  Speech Volume:Normal  Handedness:Right  Mood and Affect  Mood:Depressed  Affect:Congruent  Thought Process  Thought Processes:Coherent  Descriptions of Associations:Intact  Orientation:Full (Time, Place and Person)  Thought Content:Illogical  History of Schizophrenia/Schizoaffective disorder:Yes  Duration of Psychotic Symptoms:Greater than six months  Hallucinations:Hallucinations: Auditory Description of Command Hallucinations: voices saying random things & calling his name  Ideas of Reference:Paranoia; Delusions  Suicidal Thoughts:Suicidal Thoughts: No  Homicidal Thoughts:Homicidal Thoughts: No   Sensorium  Memory:Immediate Good  Judgment:Fair  Insight:Fair  Executive Functions  Concentration:Fair  Attention Span:Fair  [place]  Psychomotor Activity  Psychomotor Activity:Psychomotor Activity: Normal   Assets  Assets:Communication Skills  Sleep  Sleep:Sleep: Poor   Physical Exam: Physical Exam Vitals and nursing note reviewed.  HENT:     Nose: Nose normal.     Mouth/Throat:     Pharynx: Oropharynx is clear.  Cardiovascular:     Rate and Rhythm: Normal rate.     Pulses: Normal pulses.  Pulmonary:     Effort: Pulmonary effort is normal.  Musculoskeletal:        General: Normal range of motion.     Cervical back: Normal range of motion.  Skin:  General: Skin is warm.  Neurological:     General: No focal deficit present.     Mental Status: He is alert and oriented to person, place, and time.   Review of Systems  Constitutional:  Negative for chills, diaphoresis and fever.  HENT:  Negative for congestion and sore throat.   Respiratory:  Negative for shortness of breath and wheezing.   Cardiovascular:  Negative for chest pain and palpitations.  Gastrointestinal:  Negative for abdominal pain, constipation, diarrhea, heartburn, nausea and vomiting.  Neurological:  Negative for dizziness, tingling, tremors, sensory change, speech change, focal weakness, seizures, loss of consciousness, weakness and headaches.  Psychiatric/Behavioral:  Positive for hallucinations and substance abuse. Negative for depression, memory loss and suicidal ideas. The patient is nervous/anxious and has insomnia.    Blood pressure 122/79, pulse 86, temperature 97.6 F (36.4 C), temperature source Oral, resp. rate 18, height '5\' 10"'$  (1.778 m), weight 88.5 kg, SpO2 99 %. Body mass index is 27.99 kg/m.   Treatment Plan Summary: Daily contact with patient to assess and evaluate symptoms and progress in treatment and Medication management  Observation Level/Precautions:  15 minute checks  Laboratory:  Labs reviewed   Psychotherapy:  Unit Group sessions  Medications:  See [facility]  Consultations:  To  be determined   Discharge Concerns:  Safety, medication compliance, mood stability  Estimated LOS: 5-7 days  Other:  N/A   PLAN Safety and Monitoring: Voluntary admission to inpatient psychiatric unit for safety, stabilization and treatment Daily contact with patient to assess and evaluate symptoms and progress in treatment Patient's case to be discussed in multi-disciplinary team meeting Observation Level : q15 minute checks Vital signs: q12 hours Precautions: Safety                           Diagnoses Principal Problem: Undifferentiated schizophrenia ([place]) Diagnosis: Principal Problem:   Undifferentiated schizophrenia ([place]) Active Problems:   Paranoid schizophrenia ([place])   Insomnia   Opioid use disorder   Stimulant use disorder   Nicotine dependence   GAD (generalized anxiety disorder)                 Medications - Continue Olanzapine 2.5 mg po daily for psychosis. - Increase Olanzapine to 10 mg po Q bedtime  - Continue gabapentin 300 mg po bid for anxiety. - Continue Hydroxyzine 25 mg po tid prn for anxiety.  - Increase Trazodone to '100mg'$  po Q hs routinely for insomnia.   - Continue Nicotine 21 mg trans-dermally Q 24 hrs for nicotine withdrawal. - Continue Nicorette gum 2 mg po prn for nicotine withdrawal prn -Start Agitation Protocol PRN (Zyprexa/Ativan/Geodon PRN as per [facility]   Other medical issues. - Continue on Flomax  .04 mg po Q hs for prostate health. - Continue Thyroid (Armour) 90 mg po daily for hypothyroidism.  - Continue Miralax 17 gm po daily for constipation.   Other prn medications.  - acetaminophen 500 mg po bid prn for pain/fever. - Mylanta 30 ml po Q 6 hours prn for indigestion.  - MOM 30 ml po q daily prn for constipation.   Discharge Planning: Social work and case management to assist with discharge planning and identification of hospital follow-up needs prior to discharge Estimated LOS: 5-7 days Discharge Concerns: Need to establish a safety plan;  Medication compliance and effectiveness Discharge Goals: Return home with outpatient referrals for mental health follow-up including medication management/psychotherapy  Nicholes Rough, NP, pmhnp 10/04/2021, 3:28  PMPatient ID: Paul Bray, male   DOB: 04-18-1966, 56 y.o.   MRN: [id number]

## 2021-10-04 NOTE — [facility] Group Notes (Signed)
.  Psychoeducational Group Note    Date:  6/10//23 Time: 1300-1400    Purpose of Group: . The group focus' on teaching patients on how to identify their needs and their Life Skills:  A group where two lists are made. What people need and what are things that we do that are unhealthy. The lists are developed by the patients and it is explained that we often do the actions that are not healthy to get our list of needs met.  Goal:: to develop the coping skills needed to get their needs met  Participation Level:  Active  Participation Quality:  Appropriate  Affect:  Appropriate  Cognitive:  Oriented  Insight:  Improving  Engagement in Group:  Engaged  Additional Comments: Rates energy at a 5/10.  Paulino Rily

## 2021-10-04 NOTE — Progress Notes (Addendum)
D: Patient rates his depression, anxiety and hopelessness as a 5 today. He continues to hear voices. He states that one of voices does not like him. He states, "I like myself so I don't understand it." His goal today is to "live with my tumor." He states he has a headache that usually stays at a 7. Patient denies any thoughts of self harm today.  A: Continue to monitor medication management and MD orders.  Safety checks completed every 15 minutes per protocol.  Offer support and encouragement as needed.  R: Patient is receptive to staff; his behavior is appropriate.     10/04/21 1100  Psych Admission Type (Psych Patients Only)  Admission Status Involuntary  Psychosocial Assessment  Patient Complaints Anxiety;Depression  Eye Contact Brief  Facial Expression Flat;Anxious  Affect Flat;Anxious  Speech Logical/coherent  Interaction Guarded  Motor Activity Slow  Appearance/Hygiene Unremarkable  Behavior Characteristics Cooperative  Mood Pleasant  Thought Process  Coherency WDL  Content Preoccupation  Delusions Paranoid  Perception Derealization  Hallucination Auditory  Judgment Impaired  Confusion None  Danger to Self  Current suicidal ideation? Denies

## 2021-10-05 MED ORDER — GABAPENTIN 400 MG PO CAPS
400.0000 mg | ORAL_CAPSULE | Freq: Two times a day (BID) | ORAL | Status: DC
Start: 2021-10-05 — End: 2021-10-13
  Administered 2021-10-05 – 2021-10-13 (×17): 400 mg via ORAL
  Filled 2021-10-05 (×21): qty 1

## 2021-10-05 MED ORDER — OLANZAPINE 10 MG PO TBDP
10.0000 mg | ORAL_TABLET | Freq: Every day | ORAL | Status: DC
  Administered 2021-10-05: 10 mg via ORAL
  Filled 2021-10-05 (×2): qty 1

## 2021-10-05 MED ORDER — OLANZAPINE 5 MG PO TBDP
2.5000 mg | ORAL_TABLET | Freq: Every day | ORAL | Status: DC
  Filled 2021-10-05 (×2): qty 0.5

## 2021-10-05 NOTE — [facility] Group Notes (Signed)
Adult Psychoeducational Group  Date:  10/05/2021 Time:  1300-1400  Group Topic/Focus: Continuation of the group from Saturday. Looking at the lists that were created and talking about what needs to be done with the homework of 30 positives about themselves.                                     Talking about taking their power back and helping themselves to develop a positive self esteem.      Participation Quality:  Appropriate  Affect:  Appropriate  Cognitive:  Oriented  Insight: Improving  Engagement in Group:  Engaged  Modes of Intervention:  Activity, Discussion, Education, and Support  Additional Comments:  Pt was in the group for the start and stated his energy was a 5/10. He then left the group and came back in to the room as it was ending.   Paulino Rily

## 2021-10-05 NOTE — Progress Notes (Addendum)
D: Patient continues to stay awake during the day. He rates his depression, hopelessness, and anxiety as a 5. His goal is to "go home." Patient states he is eating and sleeping well. He continues to experience auditory hallucinations. He denies any SI/HI.  A: Continue to monitor medication management and MD orders.  Safety checks completed every 15 minutes per protocol.  Offer support and encouragement as needed.  R: Patient is receptive to staff; his behavior is appropriate.     10/05/21 0900  Psych Admission Type (Psych Patients Only)  Admission Status Involuntary  Psychosocial Assessment  Patient Complaints Worrying  Eye Contact Brief  Facial Expression Flat;Anxious  Affect Anxious  Speech Logical/coherent  Interaction Guarded  Motor Activity Other (Comment) (wnl)  Appearance/Hygiene Unremarkable  Behavior Characteristics Cooperative  Mood Pleasant  Thought Process  Coherency WDL  Content WDL  Delusions None reported or observed  Perception WDL  Hallucination None reported or observed  Judgment WDL  Confusion None  Danger to Self  Current suicidal ideation? Denies

## 2021-10-05 NOTE — Progress Notes (Signed)
   10/05/21 2220  Psychosocial Assessment  Eye Contact Brief  Facial Expression Flat  Affect Sad;Flat  Speech Logical/coherent  Interaction Guarded  Motor Activity Other (Comment) (wnl)  Appearance/Hygiene Unremarkable  Thought Process  Coherency WDL  Content WDL  Delusions None reported or observed  Perception WDL  Hallucination Auditory  Judgment WDL  Confusion None  Danger to Self  Current suicidal ideation? Denies  Danger to Others  Danger to Others None reported or observed

## 2021-10-05 NOTE — [facility] Group Notes (Signed)
Adult Psychoeducational Group Not Date:  10/05/2021 Time:  0900-1045 Group Topic/Focus: PROGRESSIVE RELAXATION. A group where deep breathing is taught and tensing and relaxation muscle groups is used. Imagery is used as well.  Pts are asked to imagine 3 pillars that hold them up when they are not able to hold themselves up and to share that with the group.  Participation Level:  Active  Participation Quality:  Appropriate  Affect:  Appropriate  Cognitive:  Oriented  Insight: Improving  Engagement in Group:  Engaged  Modes of Intervention:  Activity, Discussion, Education, and Support  Additional Comments:  Rates his energy at a 5/10Paid attention in the group, but only answered questions that were asked of him.  Paulino Rily

## 2021-10-05 NOTE — Progress Notes (Signed)
Adult Psychoeducational Group Note  Date:  10/05/2021 Time:  9:09 PM  Group Topic/Focus:  Wrap-Up Group:   The focus of this group is to help patients review their daily goal of treatment and discuss progress on daily workbooks.  Participation Level:  Active  Participation Quality:  Appropriate  Affect:  Appropriate  Cognitive:  Appropriate  Insight: Appropriate  Engagement in Group:  Engaged  Modes of Intervention:  Discussion  Additional Comments:  Patient said his day was a f5. His coping skills proper communication skills  Barbette Hair 10/05/2021, 9:09 PM

## 2021-10-05 NOTE — Progress Notes (Signed)
[facility] MD Progress Note  10/05/2021 2:45 PM Paul Bray  MRN:  [id number]  Subjective:  Jann reports, "Since my brain tumor, I have always thought that it is the reason for my inconsistency in life. I am still hearing the voices, and I feel like there is human frequency that can be tapped into a radio wave. I feel like someone has stolen my human frequency, and sometimes, thoughts just pop into my head, and someone has been able to have access to my human frequency and put the thoughts in my head. I have a human frequency detector at home."  Reason for admission: 56 y.o. Caucasian  male patient admitted with hx significant for schizophrenia, paranoid personality, delusional disorder, severe opioid dependence, alcohol use disorder, bipolar disorder, substance induced mood disorder.  Pt presented to the [facility] ED with complaints of worsening auditory & visual hallucinations, paranoia, and +SI, with a plan to "overmedicate" himself to death. He was on MAT (Suboxone) for Opioid abuse, & reported withdrawal symptoms due to noncompliance. Pt was involuntarily transferred to this [facility] for treatment and stabilization of his mental health symptoms.  Today's patient assessment Note: Pt's chart is reviewed, his case discussed with his treatment team. Pt is seen in his room on the 400 hall. Pt presents today with a euthymic mood, and affect is congruent. His attention to personal hygiene and grooming is fair today, which is an improvement from yesterday. Pt' eye contact is fair, speech is clear & coherent. Thought contents are organized, but he continues to present with some illogical contents. Pt currently denies SI/HI, but  continues to endorses +AH of voices talking to him in his room, and saying random things. Pt has papers which he keeps in his room and writes down everything that the voices are telling him. He states that the voices are saying: "Why are you sitting like a girl?, don't come here and  pick up stuff, Paul Bray is trying to steal your identity, Don't sit like that." He continues to report paranoia, states that a nonspecific person is out to get him. He also continues to present today with thought broadcasting and states that he feels as though his thoughts are so loud that other people know what he is thinking about.  Pt also presents with thought insertion and talks at length about "human frequency" which can be tapped into with a radio wave. He states that someone has been able to tap into his human frequency, and is able to transmit information into his brain because thoughts always "pop up in my brain, and I have no clue as to how they got there." Pt talks at some point about having multiple people inside of his body that are engaged in conversation with each other. He talks about two women who are neighbors of his who googled his name and came up with two people who have committed heinous crimes against children. He states that one lives in [place] and the other one lives in [place]. He states that his paranoia began when the two women started disseminating the information regarding these two people with whom he share a name leading to people thinking that he is actually the one who committed the crimes. He reports that he is able to communicate with these two women using the floors in his apartment as they are really thin. He reports that he wants staff here at [facility] to do a background check on him because he is not the same  person. Pt's Zyp[rexa was increased starting last night to 10 mg for psychosis. Will change this to Zyprexa Zydis at current dose to make sure that he is indeed taking the medication.   Pt reports a good appetite,and reports a good sleep quality last night with the addition of the Trazodone  to 100 mg. He is complaining of restless legs today. Will increase Gabapentin to 400 mg TID and reassess tomorrow. Will discontinue Hydroxyzine as this might be worsening  restless legs. Other medications will be continued as listed below.  Principal Problem: Undifferentiated schizophrenia ([place])  Diagnosis: Principal Problem:   Undifferentiated schizophrenia ([place]) Active Problems:   Paranoid schizophrenia ([place])   Insomnia   Opioid use disorder   Stimulant use disorder   Nicotine dependence   GAD (generalized anxiety disorder)  Total Time spent with patient:  35 minutes  Past Psychiatric History: See H&P  Past Medical History:  Past Medical History:  Diagnosis Date   ADHD    Anxiety    Brain tumor ([place])    balance and occ memory issues and headaches   Brain tumor ([place])    sees [facility] q year   Depression    Headache    migraine and tension   Hypothyroidism    Soft tissue mass    left shoulder   Substance abuse ([place])     Past Surgical History:  Procedure Laterality Date   colonscopy     gamma knife radiation 2018 for brain tumor'     hematoma removed from arm Left    MASS EXCISION Left 06/30/2019   Procedure: EXCISION OF SOFT TISSUE  MASS LEFT SHOULDER;  Surgeon: Armandina Gemma, MD;  Location: [place];  Service: General;  Laterality: Left;  LMA   Family History: History reviewed. No pertinent family history.  Family Psychiatric  History: See H&P  Social History:  Social History   Substance and Sexual Activity  Alcohol Use Not Currently   Comment: quit Oct 2019     Social History   Substance and Sexual Activity  Drug Use Not Currently   Types: Cocaine, IV, Heroin, MDMA (Ecstacy)   Comment: last reported use; cocaine and heroin in 2019; MDMA Feb 2023    Social History   Socioeconomic History   Marital status: Single    Spouse name: Not on file   Number of children: Not on file   Years of education: Not on file   Highest education level: Not on file  Occupational History   Not on file  Tobacco Use   Smoking status: Former    Types: Cigarettes   Smokeless tobacco: Current   Tobacco comments:     quit jan 2020  Vaping Use   Vaping Use: Every day  Substance and Sexual Activity   Alcohol use: Not Currently    Comment: quit Oct 2019   Drug use: Not Currently    Types: Cocaine, IV, Heroin, MDMA (Ecstacy)    Comment: last reported use; cocaine and heroin in 2019; MDMA Feb 2023   Sexual activity: Not Currently  Other Topics Concern   Not on file  Social History Narrative   Not on file   Social Determinants of Health   Financial Resource Strain: Not on file  Food Insecurity: Not on file  Transportation Needs: Not on file  Physical Activity: Not on file  Stress: Not on file  Social Connections: Not on file   Additional Social History:   Sleep: Good  Appetite:  Good  Current Medications: Current Facility-Administered Medications  Medication Dose Route Frequency Provider Last Rate Last Admin   acetaminophen (TYLENOL) tablet 500 mg  500 mg Oral BID PRN Mallie Darting, NP   500 mg at 10/04/21 6644   buprenorphine (SUBUTEX) sublingual tablet 8 mg  8 mg Sublingual TID Mallie Darting, NP   8 mg at 10/05/21 1254   gabapentin (NEURONTIN) capsule 400 mg  400 mg Oral BID Nicholes Rough, NP       OLANZapine zydis (ZYPREXA) disintegrating tablet 5 mg  5 mg Oral Q8H PRN Nicholes Rough, NP       And   LORazepam (ATIVAN) tablet 1 mg  1 mg Oral PRN Nicholes Rough, NP       And   ziprasidone (GEODON) injection 20 mg  20 mg Intramuscular PRN Coran Dipaola, Tamela Oddi, NP       nicotine (NICODERM CQ - dosed in mg/24 hours) patch 21 mg  21 mg Transdermal Daily Merlyn Lot E, NP       nicotine polacrilex (NICORETTE) gum 2 mg  2 mg Oral PRN Merlyn Lot E, NP   2 mg at 10/05/21 1426   OLANZapine zydis (ZYPREXA) disintegrating tablet 10 mg  10 mg Oral QHS Nicholes Rough, NP       [START ON 10/06/2021] OLANZapine zydis (ZYPREXA) disintegrating tablet 2.5 mg  2.5 mg Oral Daily Lowry Bala, NP       polyethylene glycol (MIRALAX / GLYCOLAX) packet 17 g  17 g Oral Daily Merlyn Lot E, NP   17 g at  10/03/21 0810   propranolol (INDERAL) tablet 10 mg  10 mg Oral TID Lindell Spar I, NP   10 mg at 10/05/21 1425   tamsulosin (FLOMAX) capsule 0.4 mg  0.4 mg Oral QPC supper Merlyn Lot E, NP   0.4 mg at 10/04/21 1738   thyroid (ARMOUR) tablet 90 mg  90 mg Oral Daily Merlyn Lot E, NP   90 mg at 10/05/21 0804   traZODone (DESYREL) tablet 100 mg  100 mg Oral QHS Nicholes Rough, NP       Lab Results:  Results for orders placed or performed during the hospital encounter of 10/02/21 (from the past 48 hour(s))  Comprehensive metabolic panel     Status: Abnormal   Collection Time: 10/03/21  6:26 PM  Result Value Ref Range   Sodium 143 135 - 145 mmol/L   Potassium 4.5 3.5 - 5.1 mmol/L   Chloride 109 98 - 111 mmol/L   CO2 28 22 - 32 mmol/L   Glucose, Bld 87 70 - 99 mg/dL    Comment: Glucose reference range applies only to samples taken after fasting for at least 8 hours.   BUN 29 (H) 6 - 20 mg/dL   Creatinine, Ser 1.13 0.61 - 1.24 mg/dL   Calcium 9.0 8.9 - 10.3 mg/dL   Total Protein 6.6 6.5 - 8.1 g/dL   Albumin 3.9 3.5 - 5.0 g/dL   AST 20 15 - 41 U/L   ALT 32 0 - 44 U/L   Alkaline Phosphatase 67 38 - 126 U/L   Total Bilirubin 0.7 0.3 - 1.2 mg/dL   GFR, Estimated >60 >60 mL/min    Comment: (NOTE) Calculated using the CKD-EPI Creatinine Equation (2021)    Anion gap 6 5 - 15    Comment: Performed at [facility], [place] [address], [place], [place] [address]  Lipid panel     Status: Abnormal   Collection  Time: 10/03/21  6:26 PM  Result Value Ref Range   Cholesterol 191 0 - 200 mg/dL   Triglycerides 92 <150 mg/dL   HDL 48 >40 mg/dL   Total CHOL/HDL Ratio 4.0 RATIO   VLDL 18 0 - 40 mg/dL   LDL Cholesterol 125 (H) 0 - 99 mg/dL    Comment:        Total Cholesterol/HDL:CHD Risk Coronary Heart Disease Risk Table                     Men   Women  1/2 Average Risk   3.4   3.3  Average Risk       5.0   4.4  2 X Average Risk   9.6   7.1  3 X Average Risk  23.4   11.0         Use the calculated Patient Ratio above and the CHD Risk Table to determine the patient's CHD Risk.        ATP III CLASSIFICATION (LDL):  <100     mg/dL   Optimal  100-129  mg/dL   Near or Above                    Optimal  130-159  mg/dL   Borderline  160-189  mg/dL   High  >190     mg/dL   Very High Performed at [place] [address] [place], [place] [address]    Blood Alcohol level:  Lab Results  Component Value Date   ETH <10 09/27/2021   ETH 41 (H) 30/16/0109   Metabolic Disorder Labs: Lab Results  Component Value Date   HGBA1C 5.0 10/03/2021   MPG 96.8 10/03/2021   No results found for: "PROLACTIN" Lab Results  Component Value Date   CHOL 191 10/03/2021   TRIG 92 10/03/2021   HDL 48 10/03/2021   CHOLHDL 4.0 10/03/2021   VLDL 18 10/03/2021   LDLCALC 125 (H) 10/03/2021   Physical Findings: AIMS: Facial and Oral Movements Muscles of Facial Expression: None, normal Lips and Perioral Area: None, normal Jaw: None, normal Tongue: None, normal,Extremity Movements Upper (arms, wrists, hands, fingers): None, normal Lower (legs, knees, ankles, toes): None, normal, Trunk Movements Neck, shoulders, hips: None, normal, Overall Severity Severity of abnormal movements (highest score from questions above): None, normal Incapacitation due to abnormal movements: None, normal Patient's awareness of abnormal movements (rate only patient's report): No Awareness, Dental Status Current problems with teeth and/or dentures?: No Does patient usually wear dentures?: No  CIWA:    COWS:     Musculoskeletal: Strength & Muscle Tone: within normal limits Gait & Station: normal Patient leans: N/A  Psychiatric Specialty Exam:  Presentation  General Appearance: Appropriate for Environment; Fairly Groomed  Eye Contact:Fair  Speech:Clear and Coherent  Speech Volume:Normal  Handedness:Right  Mood and Affect  Mood:Euthymic  Affect:Appropriate;  Congruent  Thought Process  Thought Processes:Coherent  Descriptions of Associations:Intact  Orientation:Full (Time, Place and Person)  Thought Content:Illogical  History of Schizophrenia/Schizoaffective disorder:Yes  Duration of Psychotic Symptoms:Greater than six months  Hallucinations:Hallucinations: Auditory Description of Command Hallucinations: voices saying random things & calling his name Description of Auditory Hallucinations: voices saying random things  Ideas of Reference:Paranoia; Delusions  Suicidal Thoughts:Suicidal Thoughts: No  Homicidal Thoughts:Homicidal Thoughts: No  Sensorium  Memory:Immediate Good  Judgment:Poor  Insight:Poor  Executive Functions  Concentration:Fair  Attention Span:Fair  [place]  Psychomotor Activity  Psychomotor  Activity:Psychomotor Activity: Normal  Assets  Assets:Communication Skills  Sleep  Sleep:Sleep: Good  Physical Exam: Physical Exam Vitals and nursing note reviewed.  HENT:     Nose: Nose normal.     Mouth/Throat:     Pharynx: Oropharynx is clear.  Cardiovascular:     Rate and Rhythm: Normal rate.     Pulses: Normal pulses.  Pulmonary:     Effort: Pulmonary effort is normal.  Musculoskeletal:        General: Normal range of motion.     Cervical back: Normal range of motion.  Skin:    General: Skin is warm.  Neurological:     General: No focal deficit present.     Mental Status: He is alert and oriented to person, place, and time.   Review of Systems  Constitutional:  Negative for chills, diaphoresis and fever.  HENT:  Negative for congestion and sore throat.   Respiratory:  Negative for shortness of breath and wheezing.   Cardiovascular:  Negative for chest pain and palpitations.  Gastrointestinal:  Negative for abdominal pain, constipation, diarrhea, heartburn, nausea and vomiting.  Neurological:  Negative for dizziness, tingling, tremors, sensory  change, speech change, focal weakness, seizures, loss of consciousness, weakness and headaches.  Psychiatric/Behavioral:  Positive for hallucinations and substance abuse. Negative for depression, memory loss and suicidal ideas. The patient is nervous/anxious and has insomnia (improving with medications).    Blood pressure 112/81, pulse 74, temperature 98 F (36.7 C), resp. rate 18, height '5\' 10"'$  (1.778 m), weight 88.5 kg, SpO2 100 %. Body mass index is 27.99 kg/m.  Treatment Plan Summary: Daily contact with patient to assess and evaluate symptoms and progress in treatment and Medication management  Observation Level/Precautions:  15 minute checks  Laboratory:  Labs reviewed   Psychotherapy:  Unit Group sessions  Medications:  See [facility]  Consultations:  To be determined   Discharge Concerns:  Safety, medication compliance, mood stability  Estimated LOS: 5-7 days  Other:  N/A   PLAN Safety and Monitoring: Voluntary admission to inpatient psychiatric unit for safety, stabilization and treatment Daily contact with patient to assess and evaluate symptoms and progress in treatment Patient's case to be discussed in multi-disciplinary team meeting Observation Level : q15 minute checks Vital signs: q12 hours Precautions: Safety                           Diagnoses Principal Problem: Undifferentiated schizophrenia ([place]) Diagnosis: Principal Problem:   Undifferentiated schizophrenia ([place]) Active Problems:   Paranoid schizophrenia ([place])   Insomnia   Opioid use disorder   Stimulant use disorder   Nicotine dependence   GAD (generalized anxiety disorder)                 Medications - Change Olanzapine to Zydis 2.5 mg po daily for psychosis starting 10/06/21 - Change Olanzapine to Zydis 10 mg po Q bedtime starting tonight - Increase gabapentin to 400 mg po bid for anxiety. - Discontinue Hydroxyzine 25 mg po tid prn due to restless legs - Continue Trazodone to '100mg'$  po Q hs routinely for  insomnia.   - Continue Nicotine 21 mg trans-dermally Q 24 hrs for nicotine withdrawal. - Continue Nicorette gum 2 mg po prn for nicotine withdrawal prn -Continue Agitation Protocol PRN (Zyprexa/Ativan/Geodon PRN as per [facility]   Other medical issues. - Continue on Flomax  .04 mg po Q hs for prostate health. - Continue Thyroid (Armour) 90 mg po daily  for hypothyroidism.  - Continue Miralax 17 gm po daily for constipation.   Other prn medications.  - acetaminophen 500 mg po bid prn for pain/fever. - Mylanta 30 ml po Q 6 hours prn for indigestion.  - MOM 30 ml po q daily prn for constipation.   Discharge Planning: Social work and case management to assist with discharge planning and identification of hospital follow-up needs prior to discharge Estimated LOS: 5-7 days Discharge Concerns: Need to establish a safety plan; Medication compliance and effectiveness Discharge Goals: Return home with outpatient referrals for mental health follow-up including medication management/psychotherapy  Nicholes Rough, NP, pmhnp 10/05/2021, 2:45 PMPatient ID: Paul Bray, male   DOB: 04/17/1966, 56 y.o.   MRN: [id number]

## 2021-10-05 NOTE — Group Note (Signed)
[place] LCSW Group Therapy Note  Date/Time:  10/05/2021    Type of Therapy and Topic:  Group Therapy: Music and Mood  Participation Level:  Minimal   Description of Group: In this process group, members listened to a variety of music through choosing from CSW's list #1 through #50.  Patients identified the messages received from those songs and how the music affected their emotions.  Patients were encouraged to use music as a coping skill at home, but to be mindful of the choices made.  Patients discussed how this knowledge can help with wellness and recovery in various ways including managing depression and anxiety as well as encouraging healthy sleep habits.    Therapeutic Goals: Patients will explore the impact of different songs on mood Patients will verbalize the thoughts they have when listening to different types of music Patients will identify music that is soothing to them as well as music that is energizing to them Patients will discuss how to use this knowledge to assist in maintaining wellness and recovery Patients will explore the use of music as a coping skill Patients will encourage one another  Summary of Patient Progress:  At the beginning of group, patient was not present.  He left before the end of group as well.  While there briefly, he seemed to enjoy the music.  Therapeutic Modalities: Solution Focused Brief Therapy Activity   Selmer Dominion, LCSW

## 2021-10-06 MED ORDER — HALOPERIDOL 5 MG PO TABS: 5.0000 mg | ORAL_TABLET | Freq: Four times a day (QID) | ORAL | Status: DC | PRN

## 2021-10-06 MED ORDER — BENZTROPINE MESYLATE 0.5 MG PO TABS
0.5000 mg | ORAL_TABLET | Freq: Two times a day (BID) | ORAL | Status: DC
  Administered 2021-10-06 – 2021-10-08 (×4): 0.5 mg via ORAL
  Filled 2021-10-06 (×7): qty 1

## 2021-10-06 MED ORDER — BENZTROPINE MESYLATE 1 MG PO TABS: 1.0000 mg | ORAL_TABLET | Freq: Four times a day (QID) | ORAL | Status: DC | PRN

## 2021-10-06 MED ORDER — HALOPERIDOL 5 MG PO TABS
5.0000 mg | ORAL_TABLET | Freq: Two times a day (BID) | ORAL | Status: DC
  Administered 2021-10-06 – 2021-10-07 (×2): 5 mg via ORAL
  Filled 2021-10-06 (×7): qty 1

## 2021-10-06 MED ORDER — OLANZAPINE 5 MG PO TBDP
5.0000 mg | ORAL_TABLET | Freq: Every day | ORAL | Status: DC
  Filled 2021-10-06 (×2): qty 1

## 2021-10-06 MED ORDER — OLANZAPINE 2.5 MG PO TABS
2.5000 mg | ORAL_TABLET | Freq: Every day | ORAL | Status: DC
  Administered 2021-10-06: 2.5 mg via ORAL
  Filled 2021-10-06 (×3): qty 1

## 2021-10-06 NOTE — Plan of Care (Signed)
  Problem: Education: Goal: Knowledge of the prescribed therapeutic regimen will improve Outcome: Progressing   Problem: Coping: Goal: Coping ability will improve Outcome: Progressing Goal: Will verbalize feelings Outcome: Progressing

## 2021-10-06 NOTE — Group Note (Signed)
Recreation Therapy Group Note   Group Topic:Stress Management  Group Date: 10/06/2021 Start Time: 0930 End Time: 0945 Facilitators: Victorino Sparrow, [place] Location: [address] Hall Dayroom   Goal Area(s) Addresses:  Patient will identify positive stress management techniques. Patient will identify benefits of using stress management post d/c.  Group Description:  Meditation.  LRT discussed with patients the stress management technique of meditation.  LRT then proceeded to play a meditation that focused on bringing positive energy into the day.  Patients were to listen and participate as meditation played to fully engage in the technique.    Affect/Mood: N/A   Participation Level: Did not attend    Clinical Observations/Individualized Feedback:     Plan: Continue to engage patient in RT group sessions 2-3x/week.   Victorino Sparrow, LRT,CTRS 10/06/2021 11:24 AM

## 2021-10-06 NOTE — Progress Notes (Signed)
Pt endorses auditory hallucinations which he says "annoy the hell out of me." Pt outwardly appears like he is not in any distress.  Pt appears to be paranoid but remains with pleasant affect. Pt took medications without incident and no adverse reactions were noted.

## 2021-10-06 NOTE — Progress Notes (Addendum)
[facility] MD Progress Note  10/06/2021 4:11 PM Paul Bray  MRN:  [id number]  Subjective:  Paul Bray reports, "The voices in my head are overwhelming and have been that way for the past twelve months. It feels like multiple people are having conversations in my head and it is very bothersome to me... and the two girls that live in my apartment, one on top of me and one on the side, they have been harassing me for five, six months now. They are trying to steal my identity, and framing me for committing crimes against children, and someone has gotten a hold of my human frequency."  Reason for admission: 56 y.o. Caucasian  male patient admitted with hx significant for schizophrenia, paranoid personality, delusional disorder, severe opioid dependence, alcohol use disorder, bipolar disorder, substance induced mood disorder.  Pt presented to the [facility] ED with complaints of worsening auditory & visual hallucinations, paranoia, and +SI, with a plan to "overmedicate" himself to death. He was on MAT (Suboxone) for Opioid abuse, & reported withdrawal symptoms due to noncompliance. Pt was involuntarily transferred to this [facility] for treatment and stabilization of his mental health symptoms.  Today's patient assessment Note: Pt's chart is reviewed, his case discussed with his treatment team. Pt is seen in his room on the 400 hall. Pt presents today with a euthymic mood, and affect is congruent. His attention to personal hygiene and grooming is fair today, and tending to personal hygiene needs are improving every day. Pt' eye contact is fair, speech is clear & coherent. Thought contents are organized, but he continues to present with illogical contents. Pt currently denies SI/HI, but continues to endorse +AH of multiple voices in his head having conversations with each other, and saying random things. Pt continues to keep papers in his room, and writes down everything that the voices are telling him.   Pt continues  to present with paranoia and delusional thinking. He continues to report thought broadcasting and states that he thoughts are so loud that other people know what he is thinking about. He reports thought insertion, and states that someone has gotten a hold of his "human frequency", and is inserting thoughts into his brain. Pt also reports that two girls who live in his apartment complex are trying to steal his identity, and have tried to frame him in the past for committing violent crimes against children.  Pt reports a good appetite,and reports a good sleep quality last night with Trazodone at 100 mg. He reports that his restless legs are better with the increased Gabapentin to 400 mg TID. Zyprexa will be switched over to Haldol 5 mg BID for management of his psychosis, since there has been no improvement noted while on the Zyprexa. Pt also reports that he has failed trials of Abilify and Risperdal in the past for management of his psychosis. Other medications will be continued as listed below.  Principal Problem: Undifferentiated schizophrenia ([place])  Diagnosis: Principal Problem:   Undifferentiated schizophrenia ([place]) Active Problems:   Paranoid schizophrenia ([place])   Insomnia   Opioid use disorder   Stimulant use disorder   Nicotine dependence   GAD (generalized anxiety disorder)  Total Time spent with patient:  35 minutes  Past Psychiatric History: See H&P  Past Medical History:  Past Medical History:  Diagnosis Date   ADHD    Anxiety    Brain tumor ([place])    balance and occ memory issues and headaches   Brain tumor ([place])  sees [facility] q year   Depression    Headache    migraine and tension   Hypothyroidism    Soft tissue mass    left shoulder   Substance abuse [facility])     Past Surgical History:  Procedure Laterality Date   colonscopy     gamma knife radiation 2018 for brain tumor'     hematoma removed from arm Left    MASS EXCISION Left 06/30/2019   Procedure: EXCISION  OF SOFT TISSUE  MASS LEFT SHOULDER;  Surgeon: Armandina Gemma, MD;  Location: [place];  Service: General;  Laterality: Left;  LMA   Family History: History reviewed. No pertinent family history.  Family Psychiatric  History: See H&P  Social History:  Social History   Substance and Sexual Activity  Alcohol Use Not Currently   Comment: quit Oct 2019     Social History   Substance and Sexual Activity  Drug Use Not Currently   Types: Cocaine, IV, Heroin, MDMA (Ecstacy)   Comment: last reported use; cocaine and heroin in 2019; MDMA Feb 2023    Social History   Socioeconomic History   Marital status: Single    Spouse name: Not on file   Number of children: Not on file   Years of education: Not on file   Highest education level: Not on file  Occupational History   Not on file  Tobacco Use   Smoking status: Former    Types: Cigarettes   Smokeless tobacco: Current   Tobacco comments:    quit jan 2020  Vaping Use   Vaping Use: Every day  Substance and Sexual Activity   Alcohol use: Not Currently    Comment: quit Oct 2019   Drug use: Not Currently    Types: Cocaine, IV, Heroin, MDMA (Ecstacy)    Comment: last reported use; cocaine and heroin in 2019; MDMA Feb 2023   Sexual activity: Not Currently  Other Topics Concern   Not on file  Social History Narrative   Not on file   Social Determinants of Health   Financial Resource Strain: Not on file  Food Insecurity: Not on file  Transportation Needs: Not on file  Physical Activity: Not on file  Stress: Not on file  Social Connections: Not on file   Additional Social History:   Sleep: Good  Appetite:  Good  Current Medications: Current Facility-Administered Medications  Medication Dose Route Frequency Provider Last Rate Last Admin   acetaminophen (TYLENOL) tablet 500 mg  500 mg Oral BID PRN Mallie Darting, NP   500 mg at 10/04/21 0826   benztropine (COGENTIN) tablet 0.5 mg  0.5 mg Oral BID London Tarnowski,  Tamela Oddi, NP       haloperidol (HALDOL) tablet 5 mg  5 mg Oral Q6H PRN Dwain Huhn, NP       And   benztropine (COGENTIN) tablet 1 mg  1 mg Oral Q6H PRN Ercell Perlman, NP       buprenorphine (SUBUTEX) sublingual tablet 8 mg  8 mg Sublingual TID Merlyn Lot E, NP   8 mg at 10/06/21 1209   gabapentin (NEURONTIN) capsule 400 mg  400 mg Oral BID Nicholes Rough, NP   400 mg at 10/06/21 0801   haloperidol (HALDOL) tablet 5 mg  5 mg Oral BID Nicholes Rough, NP   5 mg at 10/06/21 1317   nicotine (NICODERM CQ - dosed in mg/24 hours) patch 21 mg  21 mg Transdermal Daily Mallie Darting, NP  nicotine polacrilex (NICORETTE) gum 2 mg  2 mg Oral PRN Merlyn Lot E, NP   2 mg at 10/06/21 1317   polyethylene glycol (MIRALAX / GLYCOLAX) packet 17 g  17 g Oral Daily Merlyn Lot E, NP   17 g at 10/06/21 1037   propranolol (INDERAL) tablet 10 mg  10 mg Oral TID Lindell Spar I, NP   10 mg at 10/06/21 1435   tamsulosin (FLOMAX) capsule 0.4 mg  0.4 mg Oral QPC supper Merlyn Lot E, NP   0.4 mg at 10/05/21 1832   thyroid (ARMOUR) tablet 90 mg  90 mg Oral Daily Merlyn Lot E, NP   90 mg at 10/06/21 0801   traZODone (DESYREL) tablet 100 mg  100 mg Oral QHS Nicholes Rough, NP   100 mg at 10/05/21 2027   Lab Results:  No results found for this or any previous visit (from the past 48 hour(s)).  Blood Alcohol level:  Lab Results  Component Value Date   ETH <10 09/27/2021   ETH 41 (H) 82/42/3536   Metabolic Disorder Labs: Lab Results  Component Value Date   HGBA1C 5.0 10/03/2021   MPG 96.8 10/03/2021   No results found for: "PROLACTIN" Lab Results  Component Value Date   CHOL 191 10/03/2021   TRIG 92 10/03/2021   HDL 48 10/03/2021   CHOLHDL 4.0 10/03/2021   VLDL 18 10/03/2021   LDLCALC 125 (H) 10/03/2021   Physical Findings: AIMS: Facial and Oral Movements Muscles of Facial Expression: None, normal Lips and Perioral Area: None, normal Jaw: None, normal Tongue: None, normal,Extremity  Movements Upper (arms, wrists, hands, fingers): None, normal Lower (legs, knees, ankles, toes): None, normal, Trunk Movements Neck, shoulders, hips: None, normal, Overall Severity Severity of abnormal movements (highest score from questions above): None, normal Incapacitation due to abnormal movements: None, normal Patient's awareness of abnormal movements (rate only patient's report): No Awareness, Dental Status Current problems with teeth and/or dentures?: No Does patient usually wear dentures?: No  CIWA:    COWS:     Musculoskeletal: Strength & Muscle Tone: within normal limits Gait & Station: normal Patient leans: N/A  Psychiatric Specialty Exam:  Presentation  General Appearance: Fairly Groomed  Eye Contact:Fair  Speech:Clear and Coherent  Speech Volume:Normal  Handedness:Right  Mood and Affect  Mood:Euthymic  Affect:Congruent  Thought Process  Thought Processes:Coherent  Descriptions of Associations:Intact  Orientation:Full (Time, Place and Person)  Thought Content:Illogical  History of Schizophrenia/Schizoaffective disorder:Yes  Duration of Psychotic Symptoms:Greater than six months  Hallucinations:Hallucinations: Auditory Description of Auditory Hallucinations: multiple voices having random conversations  Ideas of Reference:Paranoia; Delusions  Suicidal Thoughts:Suicidal Thoughts: No  Homicidal Thoughts:Homicidal Thoughts: No  Sensorium  Memory:Immediate Good  Judgment:Fair  Insight:Fair  Executive Functions  Concentration:Fair  Attention Span:Fair  [place]  Psychomotor Activity  Psychomotor Activity:Psychomotor Activity: Normal  Assets  Assets:Communication Skills; Housing; Social Support  Sleep  Sleep:Sleep: Good  Physical Exam: Physical Exam Vitals and nursing note reviewed.  HENT:     Nose: Nose normal.     Mouth/Throat:     Pharynx: Oropharynx is clear.  Cardiovascular:      Rate and Rhythm: Normal rate.     Pulses: Normal pulses.  Pulmonary:     Effort: Pulmonary effort is normal.  Musculoskeletal:        General: Normal range of motion.     Cervical back: Normal range of motion.  Skin:    General: Skin is warm.  Neurological:  General: No focal deficit present.     Mental Status: He is alert and oriented to person, place, and time.   Review of Systems  Constitutional:  Negative for chills, diaphoresis and fever.  HENT:  Negative for congestion and sore throat.   Respiratory:  Negative for shortness of breath and wheezing.   Cardiovascular:  Negative for chest pain and palpitations.  Gastrointestinal:  Negative for abdominal pain, constipation, diarrhea, heartburn, nausea and vomiting.  Neurological:  Negative for dizziness, tingling, tremors, sensory change, speech change, focal weakness, seizures, loss of consciousness, weakness and headaches.  Psychiatric/Behavioral:  Positive for hallucinations and substance abuse. Negative for depression, memory loss and suicidal ideas. The patient is nervous/anxious and has insomnia (improving with medications).    Blood pressure 117/77, pulse 80, temperature 98.3 F (36.8 C), temperature source Oral, resp. rate 18, height '5\' 10"'$  (1.778 m), weight 88.5 kg, SpO2 98 %. Body mass index is 27.99 kg/m.  Treatment Plan Summary: Daily contact with patient to assess and evaluate symptoms and progress in treatment and Medication management  Observation Level/Precautions:  15 minute checks  Laboratory:  Labs reviewed   Psychotherapy:  Unit Group sessions  Medications:  See [facility]  Consultations:  To be determined   Discharge Concerns:  Safety, medication compliance, mood stability  Estimated LOS: 5-7 days  Other:  N/A   PLAN Safety and Monitoring: Voluntary admission to inpatient psychiatric unit for safety, stabilization and treatment Daily contact with patient to assess and evaluate symptoms and progress in  treatment Patient's case to be discussed in multi-disciplinary team meeting Observation Level : q15 minute checks Vital signs: q12 hours Precautions: Safety                           Diagnoses Principal Problem: Undifferentiated schizophrenia ([place]) Diagnosis: Principal Problem:   Undifferentiated schizophrenia ([place]) Active Problems:   Paranoid schizophrenia ([place])   Insomnia   Opioid use disorder   Stimulant use disorder   Nicotine dependence   GAD (generalized anxiety disorder)                 Medications - Discontinue Olanzapine due to no improvement on this medication -Start Haldol 5 mg BID for psychosis (starting 10/06/21) -Start Cogentin 0.5 mg BID for EPS Prophylaxis - Continue gabapentin to 400 mg po bid for anxiety/restless legs. - Discontinued Hydroxyzine 25 mg po tid prn due to restless legs - Continue Trazodone to '100mg'$  po Q hs routinely for insomnia.   - Continue Nicotine 21 mg trans-dermally Q 24 hrs for nicotine withdrawal. - Continue Nicorette gum 2 mg po prn for nicotine withdrawal prn -Continue Agitation Protocol PRN Q6 H (Haldol and Cogentin PRN as per [facility])   Other medical issues. - Continue on Flomax  .04 mg po Q hs for prostate health. - Continue Thyroid (Armour) 90 mg po daily for hypothyroidism.  - Continue Miralax 17 gm po daily for constipation.   Other prn medications.  - acetaminophen 500 mg po bid prn for pain/fever. - Mylanta 30 ml po Q 6 hours prn for indigestion.  - MOM 30 ml po q daily prn for constipation.   Discharge Planning: Social work and case management to assist with discharge planning and identification of hospital follow-up needs prior to discharge Estimated LOS: 5-7 days Discharge Concerns: Need to establish a safety plan; Medication compliance and effectiveness Discharge Goals: Return home with outpatient referrals for mental health follow-up including medication management/psychotherapy  Tamela Oddi  Josepha Barbier, NP, pmhnp 10/06/2021, 4:11  PMPatient ID: Paul Bray, male   DOB: 1965/07/05, 56 y.o.   MRN: [id number]   Patient ID: Paul Bray, male   DOB: 29-Jul-1965, 56 y.o.   MRN: [id number]

## 2021-10-06 NOTE — BHH Group Notes (Signed)
Spiritual care group on grief and loss facilitated by chaplain Corbin Ade, MDiv   Group Goal:  Support / Education around grief and loss   Members engage in facilitated group support and psycho-social education.   Group Description:   Following introductions and group rules, group members engaged in facilitated group dialog and support around topic of loss, with particular support around experiences of loss in their lives. Group Identified types of loss (relationships / self / things) and identified patterns, circumstances, and changes that precipitate losses. Reflected on thoughts / feelings around loss, normalized grief responses, and recognized variety in grief experience. Group noted Worden's four tasks of grief in discussion.   Group drew on Adlerian / Rogerian, narrative, MI   Patient Progress:  Patient was present for most of this group. Pt remained quiet; difficult to assess the level of passive engagement and seemed withdrawn.

## 2021-10-06 NOTE — Group Note (Signed)
LCSW Group Therapy Note   Group Date: 10/06/2021 Start Time: 1300 End Time: 1400  Type of Therapy/Topic: Group Therapy: Emotions and The Body   Participation Level: Active  Description of Group: This group will allow patients to explore how their thoughts and feelings connect with their physical health. Patients will be encouraged to think more deeply about where they begin to feel emotions within their bodies and how those emotions can affect their behaviors. The Pt will be given a worksheet to help them identify emotions and another worksheet to color in where they feel both positive and negative emotions in their body. This group will be process-oriented, with patients participating in exploration of their own experiences, giving and receiving support, and processing challenge from other group members.  Therapeutic Goals: 1. Patient will demonstrate understanding of emotions and their own bodies.  2. Patient will be able to express two feelings (a negative emotion and positive emotion) that they have felt prior to hospitalization. 3. Patient will be able to express where they feel emotions in their body and show that feeling on their worksheet by using an identifying color.  4. Patient will demonstrate their ability to communicate their needs through discussion and/or role play  Summary of Patient Progress:  The Pt attended the group and remained there the entire time.  The Pt accepted all worksheets and followed along throughout the group session.  The Pt discussed emotions openly and demonstrated understanding of how emotions can affect physical health.  The Pt was appropriate with their peers and participated in all group discussions.    Therapeutic Modalities: Cognitive Behavioral Therapy Brief Therapy Feelings Identification  Darleen Crocker, Latanya Presser 10/06/2021  2:11 PM

## 2021-10-06 NOTE — Progress Notes (Addendum)
  On assessment, pt presents with mild anxiety and depression.  Pt reports denies SI/HI/AVH, pt verbally contracts for safety.  Pt reports that the Zyprexa received on the previous night helped him more than anything.  Pt reported the Zyprexa "dulled the voices last night."  Pt stated, "the voices were saying stupid stuff."  Administered Medications.  Vital signs obtained.  Pt is safe on the unit with Q 15 minute safety checks.    10/06/21 2125  Psych Admission Type (Psych Patients Only)  Admission Status Involuntary  Psychosocial Assessment  Patient Complaints Anxiety;Depression;Worrying  Eye Contact Brief  Facial Expression Flat  Affect Anxious  Speech Logical/coherent  Interaction Forwards little  Motor Activity Other (Comment) (WNL)  Appearance/Hygiene Unremarkable  Behavior Characteristics Cooperative  Mood Pleasant;Anxious  Thought Process  Coherency WDL  Content WDL  Delusions None reported or observed  Perception Hallucinations  Hallucination Auditory  Judgment WDL  Confusion None  Danger to Self  Current suicidal ideation? Denies  Danger to Others  Danger to Others None reported or observed

## 2021-10-06 NOTE — BHH Group Notes (Signed)
Adult Psychoeducational Group Note  Date:  10/06/2021 Time:  9:45 AM  Group Topic/Focus:  Goals Group:   The focus of this group is to help patients establish daily goals to achieve during treatment and discuss how the patient can incorporate goal setting into their daily lives to aide in recovery.  Participation Level:  Active  Participation Quality:  Appropriate  Affect:  Appropriate  Cognitive:  Appropriate  Insight: Appropriate  Engagement in Group:  Improving  Modes of Intervention:  Discussion    Dub Mikes 10/06/2021, 9:45 AM

## 2021-10-06 NOTE — Group Note (Signed)
Date:  10/06/2021 Time:  5:53 PM  Group Topic/Focus:  Relapse Prevention Planning:   The focus of this group is to define relapse and discuss the need for planning to combat relapse. Recovery from mental health issues, including those with or without substance abuse components, is a transformative journey that requires resilience and perseverance. As individuals in an inpatient psychiatric hospital setting prepare to return to their homes and communities, it is crucial to equip them with the knowledge and tools to navigate setbacks effectively. In today's OT group, we will explore common barriers and triggers that can exacerbate setbacks, discuss strategies to identify and prevent setbacks early on, and offer coping mechanisms to overcome setbacks. Additionally, we will delve into creative approaches for individuals to overcome setbacks once they have experienced them, fostering a sense of empowerment and long-term recovery.    Participation Level:  Minimal  Participation Quality:  Appropriate  Affect:  Appropriate  Cognitive:  Oriented  Insight: Limited  Engagement in Group:  Limited  Modes of Intervention:  Discussion and Education  Additional Comments:  minimal participation / exited group early  Brantley Stage 10/06/2021, 5:53 PM Cornell Barman, OT

## 2021-10-07 MED ORDER — HALOPERIDOL 5 MG PO TABS
10.0000 mg | ORAL_TABLET | Freq: Every day | ORAL | Status: DC
  Administered 2021-10-07 – 2021-10-12 (×6): 10 mg via ORAL
  Filled 2021-10-07 (×10): qty 2

## 2021-10-07 MED ORDER — HALOPERIDOL 5 MG PO TABS
5.0000 mg | ORAL_TABLET | Freq: Every day | ORAL | Status: DC
  Administered 2021-10-08: 5 mg via ORAL
  Filled 2021-10-07 (×2): qty 1

## 2021-10-07 NOTE — Progress Notes (Signed)
[facility] MD Progress Note  10/07/2021 4:23 PM ROCKY GLADDEN  MRN:  [id number]  Subjective:  Paul Bray reports, "The voices are telling me that they are trying to steal my identity, they are trying to get my credit card number, bank information. One of them just said no. Someone is trying to steal my identity. People who have done some very bad crimes are trying to kill me, and slide into my identity. I came up with this term called omnidirectional which means I am getting messages from all directions."  Reason for admission: 56 y.o. Caucasian  male patient admitted with hx significant for schizophrenia, paranoid personality, delusional disorder, severe opioid dependence, alcohol use disorder, bipolar disorder, substance induced mood disorder.  Pt presented to the [facility] ED with complaints of worsening auditory & visual hallucinations, paranoia, and +SI, with a plan to "overmedicate" himself to death. He was on MAT (Suboxone) for Opioid abuse, & reported withdrawal symptoms due to noncompliance. Pt was involuntarily transferred to this [facility] for treatment and stabilization of his mental health symptoms.  Today's patient assessment Note: Pt's chart is reviewed, his case discussed with his treatment team. Pt is seen in his room on the 400 hall. Pt presents today with a euthymic mood, and affect is congruent. His attention to personal hygiene and grooming is fair today, and tending to personal hygiene needs are improving every day. Pt' eye contact is fair, speech is clear & coherent. Thoughts are organized, but he continues to present with illogical contents. Pt currently denies SI/VH/HI, but there are no changes from yesterday's assessment; pt continues to endorse +AH of multiple voices in his head having conversations with each other, and saying random things. Pt continues to keep papers in his room, and writes down everything that the voices are telling him.   Pt continues to present with paranoia and  delusional thinking. He continues to report thought broadcasting and states that his thoughts are so loud that other people know what he is thinking about. He reports thought insertion, and continues to state that someone has gotten a hold of his "human frequency", and is inserting thoughts into his brain. Pt also continues to report that two girls who live in his apartment complex are trying to steal his identity, and have tried to frame him in the past for committing violent crimes against children. He reports that someone is out to kill him so that they can slide into his identity.  Pt reports a good appetite,and reports a good sleep quality last night with Trazodone at 100 mg. He denies having restless legs today, and is tolerating taking Haldol, and denies any current side effects to this medication.  Night time dose of Haldol is being increased to 10 mg nightly for management of psychosis and will keep day time dose at 5 mg for now. AIMS-0.   Principal Problem: Undifferentiated schizophrenia ([place])  Diagnosis: Principal Problem:   Undifferentiated schizophrenia ([place]) Active Problems:   Paranoid schizophrenia ([place])   Insomnia   Opioid use disorder   Stimulant use disorder   Nicotine dependence   GAD (generalized anxiety disorder)  Total Time spent with patient:  35 minutes  Past Psychiatric History: See H&P  Past Medical History:  Past Medical History:  Diagnosis Date   ADHD    Anxiety    Brain tumor ([place])    balance and occ memory issues and headaches   Brain tumor ([place])    sees [facility] q year  Depression    Headache    migraine and tension   Hypothyroidism    Soft tissue mass    left shoulder   Substance abuse [facility])     Past Surgical History:  Procedure Laterality Date   colonscopy     gamma knife radiation 2018 for brain tumor'     hematoma removed from arm Left    MASS EXCISION Left 06/30/2019   Procedure: EXCISION OF SOFT TISSUE  MASS LEFT SHOULDER;  Surgeon:  Armandina Gemma, MD;  Location: [place];  Service: General;  Laterality: Left;  LMA   Family History: History reviewed. No pertinent family history.  Family Psychiatric  History: See H&P  Social History:  Social History   Substance and Sexual Activity  Alcohol Use Not Currently   Comment: quit Oct 2019     Social History   Substance and Sexual Activity  Drug Use Not Currently   Types: Cocaine, IV, Heroin, MDMA (Ecstacy)   Comment: last reported use; cocaine and heroin in 2019; MDMA Feb 2023    Social History   Socioeconomic History   Marital status: Single    Spouse name: Not on file   Number of children: Not on file   Years of education: Not on file   Highest education level: Not on file  Occupational History   Not on file  Tobacco Use   Smoking status: Former    Types: Cigarettes   Smokeless tobacco: Current   Tobacco comments:    quit jan 2020  Vaping Use   Vaping Use: Every day  Substance and Sexual Activity   Alcohol use: Not Currently    Comment: quit Oct 2019   Drug use: Not Currently    Types: Cocaine, IV, Heroin, MDMA (Ecstacy)    Comment: last reported use; cocaine and heroin in 2019; MDMA Feb 2023   Sexual activity: Not Currently  Other Topics Concern   Not on file  Social History Narrative   Not on file   Social Determinants of Health   Financial Resource Strain: Not on file  Food Insecurity: Not on file  Transportation Needs: Not on file  Physical Activity: Not on file  Stress: Not on file  Social Connections: Not on file   Additional Social History:   Sleep: Good  Appetite:  Good  Current Medications: Current Facility-Administered Medications  Medication Dose Route Frequency Provider Last Rate Last Admin   acetaminophen (TYLENOL) tablet 500 mg  500 mg Oral BID PRN Merlyn Lot E, NP   500 mg at 10/04/21 0826   benztropine (COGENTIN) tablet 0.5 mg  0.5 mg Oral BID Nicholes Rough, NP   0.5 mg at 10/07/21 0801    haloperidol (HALDOL) tablet 5 mg  5 mg Oral Q6H PRN Nicholes Rough, NP       And   benztropine (COGENTIN) tablet 1 mg  1 mg Oral Q6H PRN Nicholes Rough, NP       buprenorphine (SUBUTEX) sublingual tablet 8 mg  8 mg Sublingual TID Merlyn Lot E, NP   8 mg at 10/07/21 1155   gabapentin (NEURONTIN) capsule 400 mg  400 mg Oral BID Nicholes Rough, NP   400 mg at 10/07/21 0759   haloperidol (HALDOL) tablet 10 mg  10 mg Oral QHS Murat Rideout, Tamela Oddi, NP       [START ON 10/08/2021] haloperidol (HALDOL) tablet 5 mg  5 mg Oral Daily Alquan Morrish, NP       nicotine (NICODERM CQ - dosed  in mg/24 hours) patch 21 mg  21 mg Transdermal Daily Merlyn Lot E, NP   21 mg at 10/07/21 0801   nicotine polacrilex (NICORETTE) gum 2 mg  2 mg Oral PRN Merlyn Lot E, NP   2 mg at 10/07/21 9518   polyethylene glycol (MIRALAX / GLYCOLAX) packet 17 g  17 g Oral Daily Merlyn Lot E, NP   17 g at 10/07/21 0800   propranolol (INDERAL) tablet 10 mg  10 mg Oral TID Lindell Spar I, NP   10 mg at 10/07/21 1425   tamsulosin (FLOMAX) capsule 0.4 mg  0.4 mg Oral QPC supper Merlyn Lot E, NP   0.4 mg at 10/06/21 1811   thyroid (ARMOUR) tablet 90 mg  90 mg Oral Daily Merlyn Lot E, NP   90 mg at 10/07/21 0759   traZODone (DESYREL) tablet 100 mg  100 mg Oral QHS Nicholes Rough, NP   100 mg at 10/06/21 2125   Lab Results:  No results found for this or any previous visit (from the past 48 hour(s)).  Blood Alcohol level:  Lab Results  Component Value Date   ETH <10 09/27/2021   ETH 41 (H) 84/16/6063   Metabolic Disorder Labs: Lab Results  Component Value Date   HGBA1C 5.0 10/03/2021   MPG 96.8 10/03/2021   No results found for: "PROLACTIN" Lab Results  Component Value Date   CHOL 191 10/03/2021   TRIG 92 10/03/2021   HDL 48 10/03/2021   CHOLHDL 4.0 10/03/2021   VLDL 18 10/03/2021   LDLCALC 125 (H) 10/03/2021   Physical Findings: AIMS: Facial and Oral Movements Muscles of Facial Expression: None, normal Lips and  Perioral Area: None, normal Jaw: None, normal Tongue: None, normal,Extremity Movements Upper (arms, wrists, hands, fingers): None, normal Lower (legs, knees, ankles, toes): None, normal, Trunk Movements Neck, shoulders, hips: None, normal, Overall Severity Severity of abnormal movements (highest score from questions above): None, normal Incapacitation due to abnormal movements: None, normal Patient's awareness of abnormal movements (rate only patient's report): No Awareness, Dental Status Current problems with teeth and/or dentures?: No Does patient usually wear dentures?: No  CIWA:    COWS:     Musculoskeletal: Strength & Muscle Tone: within normal limits Gait & Station: normal Patient leans: N/A  Psychiatric Specialty Exam:  Presentation  General Appearance: Appropriate for Environment; Fairly Groomed  Eye Contact:Good  Speech:Clear and Coherent  Speech Volume:Normal  Handedness:Right  Mood and Affect  Mood:Euthymic  Affect:Congruent  Thought Process  Thought Processes:Coherent  Descriptions of Associations:Intact  Orientation:Full (Time, Place and Person)  Thought Content:Illogical  History of Schizophrenia/Schizoaffective disorder:Yes  Duration of Psychotic Symptoms:Greater than six months  Hallucinations:Hallucinations: Auditory Description of Auditory Hallucinations: multiple voices saying they are trying to steal his identity  Ideas of Reference:Paranoia; Delusions  Suicidal Thoughts:Suicidal Thoughts: No  Homicidal Thoughts:Homicidal Thoughts: No  Sensorium  Memory:Immediate Good  Judgment:Poor  Insight:Poor  Executive Functions  Concentration:Fair  Attention Span:Fair  [place]  Psychomotor Activity  Psychomotor Activity:Psychomotor Activity: Normal  Assets  Assets:Communication Skills; Housing; Social Support  Sleep  Sleep:Sleep: Good  Physical Exam: Physical Exam Vitals and  nursing note reviewed.  HENT:     Nose: Nose normal.     Mouth/Throat:     Pharynx: Oropharynx is clear.  Cardiovascular:     Rate and Rhythm: Normal rate.     Pulses: Normal pulses.  Pulmonary:     Effort: Pulmonary effort is normal.  Musculoskeletal:  General: Normal range of motion.     Cervical back: Normal range of motion.  Skin:    General: Skin is warm.  Neurological:     General: No focal deficit present.     Mental Status: He is alert and oriented to person, place, and time.   Review of Systems  Constitutional:  Negative for chills, diaphoresis and fever.  HENT:  Negative for congestion and sore throat.   Respiratory:  Negative for shortness of breath and wheezing.   Cardiovascular:  Negative for chest pain and palpitations.  Gastrointestinal:  Negative for abdominal pain, constipation, diarrhea, heartburn, nausea and vomiting.  Neurological:  Negative for dizziness, tingling, tremors, sensory change, speech change, focal weakness, seizures, loss of consciousness, weakness and headaches.  Psychiatric/Behavioral:  Positive for hallucinations and substance abuse. Negative for depression, memory loss and suicidal ideas. The patient is nervous/anxious and has insomnia (improving with medications).    Blood pressure 120/85, pulse 66, temperature 97.7 F (36.5 C), temperature source Oral, resp. rate 18, height '5\' 10"'$  (1.778 m), weight 88.5 kg, SpO2 98 %. Body mass index is 27.99 kg/m.  Treatment Plan Summary: Daily contact with patient to assess and evaluate symptoms and progress in treatment and Medication management  Observation Level/Precautions:  15 minute checks  Laboratory:  Labs reviewed   Psychotherapy:  Unit Group sessions  Medications:  See [facility]  Consultations:  To be determined   Discharge Concerns:  Safety, medication compliance, mood stability  Estimated LOS: 5-7 days  Other:  N/A   PLAN Safety and Monitoring: Voluntary admission to inpatient  psychiatric unit for safety, stabilization and treatment Daily contact with patient to assess and evaluate symptoms and progress in treatment Patient's case to be discussed in multi-disciplinary team meeting Observation Level : q15 minute checks Vital signs: q12 hours Precautions: Safety                           Diagnoses Principal Problem: Undifferentiated schizophrenia ([place]) Diagnosis: Principal Problem:   Undifferentiated schizophrenia ([place]) Active Problems:   Paranoid schizophrenia ([place])   Insomnia   Opioid use disorder   Stimulant use disorder   Nicotine dependence   GAD (generalized anxiety disorder)                 Medications - Discontinue Olanzapine due to no improvement on this medication -Continue Haldol 5 mg in the mornings for psychosis  -Start Haldol 10 mg Q HS for psychosis -Continue Cogentin 0.5 mg BID for EPS Prophylaxis - Continue gabapentin to 400 mg po bid for anxiety/restless legs. - Discontinued Hydroxyzine 25 mg po tid prn due to restless legs - Continue Trazodone to '100mg'$  po Q hs routinely for insomnia.   - Continue Nicotine 21 mg trans-dermally Q 24 hrs for nicotine withdrawal. - Continue Nicorette gum 2 mg po prn for nicotine withdrawal prn -Continue Agitation Protocol PRN Q6 H (Haldol and Cogentin PRN as per [facility])   Other medical issues. - Continue on Flomax  .04 mg po Q hs for prostate health. - Continue Thyroid (Armour) 90 mg po daily for hypothyroidism.  - Continue Miralax 17 gm po daily for constipation.   Other prn medications.  - acetaminophen 500 mg po bid prn for pain/fever. - Mylanta 30 ml po Q 6 hours prn for indigestion.  - MOM 30 ml po q daily prn for constipation.   Discharge Planning: Social work and case management to assist with discharge planning and identification  of hospital follow-up needs prior to discharge Estimated LOS: 5-7 days Discharge Concerns: Need to establish a safety plan; Medication compliance and  effectiveness Discharge Goals: Return home with outpatient referrals for mental health follow-up including medication management/psychotherapy  Nicholes Rough, NP, pmhnp 10/07/2021, 4:23 PMPatient ID: Paul Bray, male   DOB: 06-15-1965, 56 y.o.   MRN: [id number]

## 2021-10-07 NOTE — Progress Notes (Signed)
   10/07/21 1600  Psych Admission Type (Psych Patients Only)  Admission Status Voluntary  Psychosocial Assessment  Patient Complaints Depression  Eye Contact Brief  Facial Expression Anxious  Affect Anxious  Speech Logical/coherent  Interaction Assertive  Motor Activity Slow  Appearance/Hygiene Unremarkable  Behavior Characteristics Cooperative  Mood Anxious  Thought Process  Coherency WDL  Content WDL  Delusions None reported or observed  Perception Hallucinations  Hallucination Auditory  Judgment Impaired  Confusion None  Danger to Self  Current suicidal ideation? Denies  Danger to Others  Danger to Others None reported or observed

## 2021-10-07 NOTE — Progress Notes (Signed)
The patient rated his day as a 5 out of 10 since his day went "as planned". His goal for the day was to continue to work on his medication.

## 2021-10-07 NOTE — Group Note (Signed)
Recreation Therapy Group Note   Group Topic:Animal Assisted Therapy   Group Date: 10/07/2021 Start Time: 1430 End Time: 1500 Facilitators: Victorino Sparrow, LRT,CTRS Location: [address] Hall Dayroom   Animal-Assisted Activity (AAA) Program Checklist/Progress Notes Patient Eligibility Criteria Checklist & Daily Group note for Rec Tx Intervention  AAA/T Program Assumption of Risk Form signed by Patient/ or Parent Legal Guardian Yes  Patient is free of allergies or severe asthma Yes  Patient reports no fear of animals Yes  Patient reports no history of cruelty to animals Yes  Patient understands his/her participation is voluntary Yes  Patient washes hands before animal contact Yes  Patient washes hands after animal contact Yes   Affect/Mood: Appropriate   Participation Level: Engaged    Clinical Observations/Individualized Feedback:  Pt attended and participated in group session with therapy dog.  Pt engaged with therapy dog and listened while peers shared their experiences with their pets at home.    Plan: Continue to engage patient in RT group sessions 2-3x/week.   Victorino Sparrow, Glennis Brink 10/07/2021 3:54 PM

## 2021-10-07 NOTE — Plan of Care (Signed)
Patient is visible in the milieu. Has just presented to the medication window: calm and cooperative. Appears flat and depressed but denying SI/HI/AVH. Reported feeling some improvement. Reported that he sleeps well on Trazodone. Had no major concerns. Encouragements and support provided.

## 2021-10-08 ENCOUNTER — Encounter (HOSPITAL_COMMUNITY): Payer: Self-pay

## 2021-10-08 MED ORDER — HALOPERIDOL 5 MG PO TABS
10.0000 mg | ORAL_TABLET | Freq: Every day | ORAL | Status: DC
  Administered 2021-10-09 – 2021-10-13 (×5): 10 mg via ORAL
  Filled 2021-10-08 (×7): qty 2

## 2021-10-08 MED ORDER — TAMSULOSIN HCL 0.4 MG PO CAPS
0.4000 mg | ORAL_CAPSULE | Freq: Once | ORAL | Status: AC
  Administered 2021-10-08: 0.4 mg via ORAL
  Filled 2021-10-08: qty 1

## 2021-10-08 MED ORDER — TAMSULOSIN HCL 0.4 MG PO CAPS
0.8000 mg | ORAL_CAPSULE | Freq: Every day | ORAL | Status: DC
  Administered 2021-10-08 – 2021-10-13 (×6): 0.8 mg via ORAL
  Filled 2021-10-08 (×8): qty 2

## 2021-10-08 MED ORDER — TAMSULOSIN HCL 0.4 MG PO CAPS: 0.8000 mg | ORAL_CAPSULE | Freq: Once | ORAL | Status: DC

## 2021-10-08 MED ORDER — AMANTADINE HCL 100 MG PO CAPS
100.0000 mg | ORAL_CAPSULE | Freq: Two times a day (BID) | ORAL | Status: DC
  Administered 2021-10-08 – 2021-10-13 (×11): 100 mg via ORAL
  Filled 2021-10-08 (×16): qty 1

## 2021-10-08 NOTE — Group Note (Signed)
Date:  10/08/2021 Time:  11:03 AM  Group Topic/Focus:  Orientation:   The focus of this group is to educate the patient on the purpose and policies of crisis stabilization and provide a format to answer questions about their admission.  The group details unit policies and expectations of patients while admitted.    Participation Level:  Active  Participation Quality:  Appropriate  Affect:  Appropriate  Cognitive:  Appropriate  Insight: Appropriate  Engagement in Group:  Engaged  Modes of Intervention:  Discussion  Additional Comments:  Patient wants to work on being positive.  Jerrye Beavers 10/08/2021, 11:03 AM

## 2021-10-08 NOTE — Progress Notes (Signed)
[facility] MD Progress Note  10/08/2021 10:22 AM Paul Bray  MRN:  [id number]  Subjective:  Paul Bray reports, "I'm still hearing voices. I don't know if the voices are getting better or worse. I know it is there".  Reason for admission: 56 y.o. Caucasian  male patient admitted with hx significant for schizophrenia, paranoid personality, delusional disorder, severe opioid dependence, alcohol use disorder, bipolar disorder, substance induced mood disorder.  Pt presented to the [facility] ED with complaints of worsening auditory & visual hallucinations, paranoia, and +SI, with a plan to "overmedicate" himself to death. He was on MAT (Suboxone) for Opioid abuse, & reported withdrawal symptoms due to noncompliance. Pt was involuntarily transferred to this [facility] for treatment and stabilization of his mental health symptoms.  Today's patient assessment Note: Paul Bray is seen, chart reviewed. The chart findings discussed with the treatment team. Pt is seen in his room on the 400 hall. He was lying down in his bed. Pt presents today with a good affect & eye contact. His attention to personal hygiene & grooming is fair. He says he is still hearing voices, but denies any visual hallucinations. He kept a journal of all that the voices are saying to him such as;  why are you disabled? No more guitar, Altha Harm hates you, you can hear [place], do not keep that down. These are some of the things he is hearing & had documented in his journal. Patient was recently started on Haldol for the psychosis. The Zyprexa was discontinued due to his problems with weight gain & narcolepsy. Patient questioned the reason for the switch. This was explained to him as described above, all the possible side effects that the Zyprexa can cause him. Patient is also complaining of worsening BPH symptoms. He says he has not been urinating adequately since last night. He was already on Flomax 0.4 mg daily, this was increased to 0.8 mg daily to aid his  urine flow. His Cogentin is also discontinued due to it's anticholinergic effects. He was started on Amantadine 100 mg po bid for eps prevention.Paul Bray also has not been participating in the group sessions, he says the reason being there are too many people in the group sessions talking. He says for the fact that he is unable to sit comfortably because of his BHP symptoms, it is aggravating to have sit in the group room feeling uncomfortable & listening to all the talks including the one going on in his head. He is encouraged to at least come out of his room & walk the hall way at 3-4 times daily. He is explained all the changes made on his treatment regimen & the justification for those. Paul Bray reports that he has been able to urinate today & is feeling a lot better. He currently denies any SIHI,plans or intent. Denies any VH, delusional thoughts or paranoia. He does not appear to be responding to any internal stimuli. Pt reports a good appetite. He denies having restless leg syndrome. He is tolerating his treatment regimen,  denies any current side effects. Reviewed vital signs, pulse rate is slightly low at 54. He is in no apparent distress. Will continue current plan of acre as already in progress.  Principal Problem: Undifferentiated schizophrenia ([place])  Diagnosis: Principal Problem:   Undifferentiated schizophrenia ([place]) Active Problems:   Paranoid schizophrenia ([place])   Insomnia   Opioid use disorder   Stimulant use disorder   Nicotine dependence   GAD (generalized anxiety disorder)  Total Time spent with  patient:  35 minutes  Past Psychiatric History: See H&P  Past Medical History:  Past Medical History:  Diagnosis Date   ADHD    Anxiety    Brain tumor ([place])    balance and occ memory issues and headaches   Brain tumor ([place])    sees [facility] q year   Depression    Headache    migraine and tension   Hypothyroidism    Soft tissue mass    left shoulder   Substance abuse ([place])      Past Surgical History:  Procedure Laterality Date   colonscopy     gamma knife radiation 2018 for brain tumor'     hematoma removed from arm Left    MASS EXCISION Left 06/30/2019   Procedure: EXCISION OF SOFT TISSUE  MASS LEFT SHOULDER;  Surgeon: Armandina Gemma, MD;  Location: [place];  Service: General;  Laterality: Left;  LMA   Family History: History reviewed. No pertinent family history.  Family Psychiatric  History: See H&P  Social History:  Social History   Substance and Sexual Activity  Alcohol Use Not Currently   Comment: quit Oct 2019     Social History   Substance and Sexual Activity  Drug Use Not Currently   Types: Cocaine, IV, Heroin, MDMA (Ecstacy)   Comment: last reported use; cocaine and heroin in 2019; MDMA Feb 2023    Social History   Socioeconomic History   Marital status: Single    Spouse name: Not on file   Number of children: Not on file   Years of education: Not on file   Highest education level: Not on file  Occupational History   Not on file  Tobacco Use   Smoking status: Former    Types: Cigarettes   Smokeless tobacco: Current   Tobacco comments:    quit jan 2020  Vaping Use   Vaping Use: Every day  Substance and Sexual Activity   Alcohol use: Not Currently    Comment: quit Oct 2019   Drug use: Not Currently    Types: Cocaine, IV, Heroin, MDMA (Ecstacy)    Comment: last reported use; cocaine and heroin in 2019; MDMA Feb 2023   Sexual activity: Not Currently  Other Topics Concern   Not on file  Social History Narrative   Not on file   Social Determinants of Health   Financial Resource Strain: Not on file  Food Insecurity: Not on file  Transportation Needs: Not on file  Physical Activity: Not on file  Stress: Not on file  Social Connections: Not on file   Additional Social History:   Sleep: Good  Appetite:  Good  Current Medications: Current Facility-Administered Medications  Medication Dose Route  Frequency Provider Last Rate Last Admin   acetaminophen (TYLENOL) tablet 500 mg  500 mg Oral BID PRN Merlyn Lot E, NP   500 mg at 10/04/21 0826   benztropine (COGENTIN) tablet 0.5 mg  0.5 mg Oral BID Nicholes Rough, NP   0.5 mg at 10/08/21 5427   haloperidol (HALDOL) tablet 5 mg  5 mg Oral Q6H PRN Nicholes Rough, NP       And   benztropine (COGENTIN) tablet 1 mg  1 mg Oral Q6H PRN Nicholes Rough, NP       buprenorphine (SUBUTEX) sublingual tablet 8 mg  8 mg Sublingual TID Merlyn Lot E, NP   8 mg at 10/08/21 0753   gabapentin (NEURONTIN) capsule 400 mg  400 mg Oral  BID Nicholes Rough, NP   400 mg at 10/08/21 3614   haloperidol (HALDOL) tablet 10 mg  10 mg Oral QHS Nicholes Rough, NP   10 mg at 10/07/21 2045   haloperidol (HALDOL) tablet 5 mg  5 mg Oral Daily Nicholes Rough, NP   5 mg at 10/08/21 0753   nicotine polacrilex (NICORETTE) gum 2 mg  2 mg Oral PRN Merlyn Lot E, NP   2 mg at 10/08/21 0751   polyethylene glycol (MIRALAX / GLYCOLAX) packet 17 g  17 g Oral Daily Merlyn Lot E, NP   17 g at 10/07/21 0800   propranolol (INDERAL) tablet 10 mg  10 mg Oral TID Lindell Spar I, NP   10 mg at 10/08/21 0753   tamsulosin (FLOMAX) capsule 0.4 mg  0.4 mg Oral QPC supper Merlyn Lot E, NP   0.4 mg at 10/07/21 1729   thyroid (ARMOUR) tablet 90 mg  90 mg Oral Daily Merlyn Lot E, NP   90 mg at 10/08/21 4315   traZODone (DESYREL) tablet 100 mg  100 mg Oral QHS Nicholes Rough, NP   100 mg at 10/07/21 2100   Lab Results:  No results found for this or any previous visit (from the past 48 hour(s)).  Blood Alcohol level:  Lab Results  Component Value Date   ETH <10 09/27/2021   ETH 41 (H) 40/11/6759   Metabolic Disorder Labs: Lab Results  Component Value Date   HGBA1C 5.0 10/03/2021   MPG 96.8 10/03/2021   No results found for: "PROLACTIN" Lab Results  Component Value Date   CHOL 191 10/03/2021   TRIG 92 10/03/2021   HDL 48 10/03/2021   CHOLHDL 4.0 10/03/2021   VLDL 18  10/03/2021   LDLCALC 125 (H) 10/03/2021   Physical Findings: AIMS: Facial and Oral Movements Muscles of Facial Expression: None, normal Lips and Perioral Area: None, normal Jaw: None, normal Tongue: None, normal,Extremity Movements Upper (arms, wrists, hands, fingers): None, normal Lower (legs, knees, ankles, toes): None, normal, Trunk Movements Neck, shoulders, hips: None, normal, Overall Severity Severity of abnormal movements (highest score from questions above): None, normal Incapacitation due to abnormal movements: None, normal Patient's awareness of abnormal movements (rate only patient's report): No Awareness, Dental Status Current problems with teeth and/or dentures?: No Does patient usually wear dentures?: No  CIWA:    COWS:     Musculoskeletal: Strength & Muscle Tone: within normal limits Gait & Station: normal Patient leans: N/A  Psychiatric Specialty Exam:  Presentation  General Appearance: Appropriate for Environment; Fairly Groomed  Eye Contact:Good  Speech:Clear and Coherent  Speech Volume:Normal  Handedness:Right  Mood and Affect  Mood:Euthymic  Affect:Congruent  Thought Process  Thought Processes:Coherent  Descriptions of Associations:Intact  Orientation:Full (Time, Place and Person)  Thought Content:Illogical  History of Schizophrenia/Schizoaffective disorder:Yes  Duration of Psychotic Symptoms:Greater than six months  Hallucinations:Hallucinations: Auditory Description of Auditory Hallucinations: multiple voices saying they are trying to steal his identity  Ideas of Reference:Paranoia; Delusions  Suicidal Thoughts:Suicidal Thoughts: No  Homicidal Thoughts:Homicidal Thoughts: No  Sensorium  Memory:Immediate Good  Judgment:Poor  Insight:Poor  Executive Functions  Concentration:Fair  Attention Span:Fair  [place]  Psychomotor Activity  Psychomotor Activity:Psychomotor  Activity: Normal  Assets  Assets:Communication Skills; Housing; Social Support  Sleep  Sleep:Sleep: Good  Physical Exam: Physical Exam Vitals and nursing note reviewed.  HENT:     Nose: Nose normal.     Mouth/Throat:     Pharynx: Oropharynx is  clear.  Cardiovascular:     Rate and Rhythm: Normal rate.     Pulses: Normal pulses.  Pulmonary:     Effort: Pulmonary effort is normal.  Musculoskeletal:        General: Normal range of motion.     Cervical back: Normal range of motion.  Skin:    General: Skin is warm.  Neurological:     General: No focal deficit present.     Mental Status: He is alert and oriented to person, place, and time.   Review of Systems  Constitutional:  Negative for chills, diaphoresis and fever.  HENT:  Negative for congestion and sore throat.   Respiratory:  Negative for shortness of breath and wheezing.   Cardiovascular:  Negative for chest pain and palpitations.  Gastrointestinal:  Negative for abdominal pain, constipation, diarrhea, heartburn, nausea and vomiting.  Neurological:  Negative for dizziness, tingling, tremors, sensory change, speech change, focal weakness, seizures, loss of consciousness, weakness and headaches.  Psychiatric/Behavioral:  Positive for hallucinations and substance abuse. Negative for depression, memory loss and suicidal ideas. The patient is nervous/anxious and has insomnia (improving with medications).    Blood pressure 96/72, pulse (!) 54, temperature 97.6 F (36.4 C), resp. rate 17, height '5\' 10"'$  (1.778 m), weight 88.5 kg, SpO2 99 %. Body mass index is 27.99 kg/m.  Treatment Plan Summary: Daily contact with patient to assess and evaluate symptoms and progress in treatment and Medication management.   Continue inpatient hospitalization.  Will continue today 10/08/2021 plan as below except where it is noted.   PLAN Safety and Monitoring: Voluntary admission to inpatient psychiatric unit for safety, stabilization and  treatment Daily contact with patient to assess and evaluate symptoms and progress in treatment Patient's case to be discussed in multi-disciplinary team meeting Observation Level : q15 minute checks Vital signs: q12 hours Precautions: Safety                           Diagnoses Principal Problem: Undifferentiated schizophrenia ([place]) Diagnosis: Principal Problem:   Undifferentiated schizophrenia ([place]) Active Problems:   Paranoid schizophrenia ([place])   Insomnia   Opioid use disorder   Stimulant use disorder   Nicotine dependence   GAD (generalized anxiety disorder)  Medications - Increased haldol Haldol from 5 mg to 10 mg po  in am for psychosis  - Continue Haldol 10 mg Q HS for psychosis - Discontinued Cogentin 0.5 mg BID for EPS Prophylaxis due to Hx. BPH. - Initiated Amantadine 100 mg po bid for eps. - Continue gabapentin to 400 mg po bid for anxiety/restless legs. - Continue Trazodone to '100mg'$  po Q hs routinely for insomnia.   - Continue Nicotine 21 mg trans-dermally Q 24 hrs for nicotine withdrawal. - Continue Nicorette gum 2 mg po prn for nicotine withdrawal prn - Continue Agitation Protocol PRN Q6 H (Haldol and Cogentin PRN as per [facility])   Other medical issues. - Increased Flomax  to 0.8 mg po Q hs for prostate health.  - Administered Flomax 0.4 mg po once today for BPH. - Continue Thyroid (Armour) 90 mg po daily for hypothyroidism.  - Continue Miralax 17 gm po daily for constipation.   Other prn medications.  - acetaminophen 500 mg po bid prn for pain/fever. - Mylanta 30 ml po Q 6 hours prn for indigestion.  - MOM 30 ml po q daily prn for constipation.   Discharge Planning: Social work and case management to assist with discharge  planning and identification of hospital follow-up needs prior to discharge Estimated LOS: 5-7 days Discharge Concerns: Need to establish a safety plan; Medication compliance and effectiveness Discharge Goals: Return home with outpatient  referrals for mental health follow-up including medication management/psychotherapy  Observation Level/Precautions:  15 minute checks  Laboratory:  Labs reviewed   Psychotherapy:  Unit Group sessions  Medications:  See [facility]  Consultations:  To be determined   Discharge Concerns:  Safety, medication compliance, mood stability  Estimated LOS: 5-7 days  Other:  N/A   Lindell Spar, NP, pmhnp, fnp-bc 10/08/2021, 10:22 AMPatient ID: Paul Bray, male   DOB: [birth year], 56 y.o.   MRN: [id number] Patient ID: Paul Bray, male   DOB: 06-11-1965, 56 y.o.   MRN: [id number]

## 2021-10-08 NOTE — Group Note (Signed)
Recreation Therapy Group Note   Group Topic:Team Building  Group Date: 10/08/2021 Start Time: 0935 End Time: 0955 Facilitators: Victorino Sparrow, LRT,CTRS Location: [address] Hall Dayroom   Goal Area(s) Addresses:  Patient will effectively work with peer towards shared goal.  Patient will identify skills used to make activity successful.  Patient will identify how skills used during activity can be applied to reach post d/c goals.   Group Description: [employer]. In teams of 5-6, patients were given 25 small craft pipe cleaners. Using the materials provided, patients were instructed to compete again the opposing team(s) to build the tallest free-standing structure from floor level. The activity was timed; difficulty increased by Probation officer as Pharmacist, hospital continued.  Systematically resources were removed with additional directions for example, placing one arm behind their back, working in silence, and shape stipulations. LRT facilitated post-activity discussion reviewing team processes and necessary communication skills involved in completion. Patients were encouraged to reflect how the skills utilized, or not utilized, in this activity can be incorporated to positively impact support systems post discharge.   Affect/Mood: N/A   Participation Level: Did not attend    Clinical Observations/Individualized Feedback:     Plan: Continue to engage patient in RT group sessions 2-3x/week.   Victorino Sparrow, Glennis Brink 10/08/2021 12:55 PM

## 2021-10-08 NOTE — Group Note (Signed)
LCSW Group Therapy Note   Group Date: 10/08/2021 Start Time: 1300 End Time: 1400  Type of Therapy and Topic:  Group Therapy:  Stress Management   Participation Level:  Did not attend    Description of Group:  Patients in this group were introduced to the idea of stress and encouraged to discuss negative and positive ways to manage stress. Patients discussed specific stressors that they have in their life right now and the physical signs and symptoms associated with that stress.  Patient encouraged to come up with positive changes to assist with the stress upon discharge in order to prevent future hospitalizations.   They also worked as a group on developing a specific plan for several patients to deal with stressors through [place], psychoeducation and self care techniques   Therapeutic Goals:               1)  To discuss the positive and negative impacts of stress             2)  identify signs and symptoms of stress             3)  generate ideas for stress management             4)  offer mutual support to others regarding stress management             5)  Developing plans for ways to manage specific stressors upon discharge               Summary of Patient Progress:  Did not attend    Therapeutic Modalities:   [place], [place] 10/08/2021  2:01 PM

## 2021-10-08 NOTE — Group Note (Signed)
Date:  10/08/2021 Time:  11:08 AM  Group Topic/Focus: Mindfulness Managing Feelings:   The focus of this group is to identify what feelings patients have difficulty handling and develop a plan to handle them in a healthier way upon discharge.    Participation Level:  Did Not Attend  Participation Quality:   Did Not Attend  Affect:  Did Not Attend  Cognitive:   Did Not Attend  Insight: None  Engagement in Group:  None  Modes of Intervention:   Did Not Attend  Additional Comments:  Pt did not attend this group  [employer] 10/08/2021, 11:08 AM

## 2021-10-08 NOTE — [facility] Group Notes (Signed)
Patient did not attend the NA group. 

## 2021-10-08 NOTE — Progress Notes (Addendum)
D: Patient states he is eating and sleeping well. He rates his depression and anxiety as a 5. He rates his hopelessness as a 5. Patient continues to hear voices that are "nonspecific." Patient cannot describe exactly what the voices are saying. He did indicate that he is writing down his thoughts on paper. Patient is visible in the milieu; he attend some groups.  A: Continue to monitor medication management and MD orders.  Safety checks completed every 15 minutes per protocol.  Offer support and encouragement as needed.  R: Patient is receptive to staff; his behavior is appropriate.     10/08/21 0900  Psych Admission Type (Psych Patients Only)  Admission Status Voluntary  Psychosocial Assessment  Patient Complaints Depression  Eye Contact Brief  Facial Expression Flat  Affect Depressed  Speech Logical/coherent  Interaction Assertive  Motor Activity Slow  Appearance/Hygiene Unremarkable  Behavior Characteristics Cooperative  Mood Depressed  Thought Process  Coherency WDL  Content WDL  Delusions None reported or observed  Perception WDL  Hallucination None reported or observed  Judgment Poor  Confusion None  Danger to Self  Current suicidal ideation? Denies  Danger to Others  Danger to Others None reported or observed

## 2021-10-08 NOTE — BH IP Treatment Plan (Signed)
Interdisciplinary Treatment and Diagnostic Plan Update  10/08/2021 Time of Session: 0830 Paul Bray MRN: [id number]  Principal Diagnosis: Undifferentiated schizophrenia [facility])  Secondary Diagnoses: Principal Problem:   Undifferentiated schizophrenia ([place]) Active Problems:   Paranoid schizophrenia ([place])   Insomnia   Opioid use disorder   Stimulant use disorder   Nicotine dependence   GAD (generalized anxiety disorder)   Current Medications:  Current Facility-Administered Medications  Medication Dose Route Frequency Provider Last Rate Last Admin   acetaminophen (TYLENOL) tablet 500 mg  500 mg Oral BID PRN Merlyn Lot E, NP   500 mg at 10/04/21 3500   benztropine (COGENTIN) tablet 0.5 mg  0.5 mg Oral BID Nicholes Rough, NP   0.5 mg at 10/08/21 9381   haloperidol (HALDOL) tablet 5 mg  5 mg Oral Q6H PRN Nicholes Rough, NP       And   benztropine (COGENTIN) tablet 1 mg  1 mg Oral Q6H PRN Nicholes Rough, NP       buprenorphine (SUBUTEX) sublingual tablet 8 mg  8 mg Sublingual TID Merlyn Lot E, NP   8 mg at 10/08/21 1159   gabapentin (NEURONTIN) capsule 400 mg  400 mg Oral BID Nicholes Rough, NP   400 mg at 10/08/21 8299   haloperidol (HALDOL) tablet 10 mg  10 mg Oral QHS Nkwenti, Tamela Oddi, NP   10 mg at 10/07/21 2045   haloperidol (HALDOL) tablet 5 mg  5 mg Oral Daily Nicholes Rough, NP   5 mg at 10/08/21 0753   nicotine polacrilex (NICORETTE) gum 2 mg  2 mg Oral PRN Merlyn Lot E, NP   2 mg at 10/08/21 0751   polyethylene glycol (MIRALAX / GLYCOLAX) packet 17 g  17 g Oral Daily Merlyn Lot E, NP   17 g at 10/07/21 0800   propranolol (INDERAL) tablet 10 mg  10 mg Oral TID Lindell Spar I, NP   10 mg at 10/08/21 0753   tamsulosin (FLOMAX) capsule 0.4 mg  0.4 mg Oral QPC supper Merlyn Lot E, NP   0.4 mg at 10/07/21 1729   thyroid (ARMOUR) tablet 90 mg  90 mg Oral Daily Merlyn Lot E, NP   90 mg at 10/08/21 3716   traZODone (DESYREL) tablet 100 mg  100 mg Oral QHS Nicholes Rough, NP   100 mg at 10/07/21 2100   PTA Medications: Medications Prior to Admission  Medication Sig Dispense Refill Last Dose   acetaminophen (TYLENOL) 500 MG tablet Take 2,000 mg by mouth 2 (two) times daily as needed for headache (pain).       ARIPiprazole (ABILIFY) 20 MG tablet Take 20 mg by mouth at bedtime as needed.      buprenorphine (SUBUTEX) 8 MG SUBL SL tablet Place 8 mg under the tongue 3 (three) times daily.      Fluocinolone Acetonide 0.01 % OIL Apply 3 to 5 drops to affected area of ears once to twice daily as needed.      gabapentin (NEURONTIN) 300 MG capsule Take 300 mg by mouth 2 (two) times daily as needed (sleep).      phentermine 37.5 MG capsule Take 1 capsule (37.5 mg total) by mouth every morning. 30 capsule 0    propranolol (INDERAL) 20 MG tablet Take 1 tablet by mouth 3 (three) times daily as needed.      thyroid (ARMOUR THYROID) 90 MG tablet Take 1 tablet (90 mg total) by mouth daily. 30 tablet 2     Patient Stressors:  Patient Strengths:    Treatment Modalities: Medication Management, Group therapy, Case management,  1 to 1 session with clinician, Psychoeducation, Recreational therapy.   Physician Treatment Plan for Primary Diagnosis: Undifferentiated schizophrenia ([place]) Long Term Goal(s): Improvement in symptoms so as ready for discharge   Short Term Goals: Ability to identify and develop effective coping behaviors will improve Ability to maintain clinical measurements within normal limits will improve Compliance with prescribed medications will improve Ability to identify triggers associated with substance abuse/mental health issues will improve Ability to identify changes in lifestyle to reduce recurrence of condition will improve Ability to verbalize feelings will improve Ability to disclose and discuss suicidal ideas Ability to demonstrate self-control will improve  Medication Management: Evaluate patient's response, side effects, and tolerance of  medication regimen.  Therapeutic Interventions: 1 to 1 sessions, Unit Group sessions and Medication administration.  Evaluation of Outcomes: Not Met  Physician Treatment Plan for Secondary Diagnosis: Principal Problem:   Undifferentiated schizophrenia ([place]) Active Problems:   Paranoid schizophrenia ([place])   Insomnia   Opioid use disorder   Stimulant use disorder   Nicotine dependence   GAD (generalized anxiety disorder)  Long Term Goal(s): Improvement in symptoms so as ready for discharge   Short Term Goals: Ability to identify and develop effective coping behaviors will improve Ability to maintain clinical measurements within normal limits will improve Compliance with prescribed medications will improve Ability to identify triggers associated with substance abuse/mental health issues will improve Ability to identify changes in lifestyle to reduce recurrence of condition will improve Ability to verbalize feelings will improve Ability to disclose and discuss suicidal ideas Ability to demonstrate self-control will improve     Medication Management: Evaluate patient's response, side effects, and tolerance of medication regimen.  Therapeutic Interventions: 1 to 1 sessions, Unit Group sessions and Medication administration.  Evaluation of Outcomes: Not Met   RN Treatment Plan for Primary Diagnosis: Undifferentiated schizophrenia ([place]) Long Term Goal(s): Knowledge of disease and therapeutic regimen to maintain health will improve  Short Term Goals: Ability to remain free from injury will improve, Ability to verbalize frustration and anger appropriately will improve, Ability to demonstrate self-control, Ability to participate in decision making will improve, Ability to verbalize feelings will improve, Ability to disclose and discuss suicidal ideas, Ability to identify and develop effective coping behaviors will improve, and Compliance with prescribed medications will  improve  Medication Management: RN will administer medications as ordered by provider, will assess and evaluate patient's response and provide education to patient for prescribed medication. RN will report any adverse and/or side effects to prescribing provider.  Therapeutic Interventions: 1 on 1 counseling sessions, Psychoeducation, Medication administration, Evaluate responses to treatment, Monitor vital signs and CBGs as ordered, Perform/monitor CIWA, COWS, AIMS and Fall Risk screenings as ordered, Perform wound care treatments as ordered.  Evaluation of Outcomes: Not Met   LCSW Treatment Plan for Primary Diagnosis: Undifferentiated schizophrenia ([place]) Long Term Goal(s): Safe transition to appropriate next level of care at discharge, Engage patient in therapeutic group addressing interpersonal concerns.  Short Term Goals: Engage patient in aftercare planning with referrals and resources, Increase social support, Increase ability to appropriately verbalize feelings, Increase emotional regulation, Facilitate acceptance of mental health diagnosis and concerns, Facilitate patient progression through stages of change regarding substance use diagnoses and concerns, Identify triggers associated with mental health/substance abuse issues, and Increase skills for wellness and recovery  Therapeutic Interventions: Assess for all discharge needs, 1 to 1 time with Social worker, Explore available resources and  support systems, Assess for adequacy in community support network, Educate family and significant other(s) on suicide prevention, Complete Psychosocial Assessment, Interpersonal group therapy.  Evaluation of Outcomes: Not Met   Progress in Treatment: Attending groups: Yes. Participating in groups: No. Taking medication as prescribed: Yes. Toleration medication: Yes. Family/Significant other contact made: Yes, individual(s) contacted:  SPE completed with Ronalee Red, mother.   Patient understands  diagnosis: No. Discussing patient identified problems/goals with staff: Yes. Medical problems stabilized or resolved: Yes. Denies suicidal/homicidal ideation: Yes. Issues/concerns per patient self-inventory: Yes. Other: none   New problem(s) identified: No, Describe:  none  New Short Term/Long Term Goal(s): Patient to work towards detox, elimination of symptoms of psychosis, medication management for mood stabilization; elimination of SI thoughts; development of comprehensive mental wellness/sobriety plan.  Patient Goals: No additional goals identified at this time. Patient to continue to work towards original goals identified in initial treatment team meeting. CSW will remain available to patient should they voice additional treatment goals.   Discharge Plan or Barriers: No psychosocial barriers identified at this time, patient to return to place of residence when appropriate for discharge.   Reason for Continuation of Hospitalization: Delusions  Hallucinations  Estimated Length of Stay: 1-7 days   Last 3 [place] Suicide Severity Risk Score: Flowsheet Row Admission (Current) from 10/02/2021 in [facility] ED from 09/27/2021 in [place] High Risk High Risk        Scribe for Treatment Team: Larose Kells 10/08/2021 12:13 PM

## 2021-10-09 NOTE — B[facility] Group Notes (Signed)
[place] Group Notes:  (Nursing/MHT/Case Management/Adjunct)  Date:  10/09/2021  Time:  11:48 AM  Type of Therapy:  Group Therapy  Participation Level:  Active  Participation Quality:  Appropriate  Affect:  Appropriate  Cognitive:  Appropriate  Insight:  Appropriate  Engagement in Group:  Engaged  Modes of Intervention:  Discussion  Summary of Progress/Problems:  Patient attended a check in group to see what brings them joy and motivates them and was asked a couple questions.  What is something in your life that gives you great joy and meaning?  What is something that motivates you? How has your perspective on life changed as you've gotten older?  Music Doing the right thing  Didn't know as much as I thought it did and the importance of learning   Elza Rafter 10/09/2021, 11:48 AM

## 2021-10-09 NOTE — Progress Notes (Signed)
Patient ID: Paul Bray, male   DOB: 1965/12/25, 56 y.o.   MRN: [id number]  References to Addendum 09/25/2021 Poststroke psychosis: a systematic review Helle Stangeland,1,2 Vasiliki Orgeta,2 and Randa Spike information Article notes Copyright and License information Disclaimer Associated Data Supplementary Materials Go to: Abstract A preregistered systematic review of poststroke psychosis examining clinical characteristics, prevalence, diagnostic procedures, lesion location, treatments, risk factors and outcome. Neuropsychiatric outcomes following stroke are common and severely impact quality of life. No previous reviews have focused on poststroke psychosis despite clear clinical need. CINAHL, MEDLINE and PsychINFO were searched for studies on poststroke psychosis published between 1975 and 2016. Reviewers independently selected studies for inclusion, extracted data and rated study quality. Out of 2442 references, 76 met inclusion criteria. Average age for poststroke psychosis was 66.6 years with slightly more males than females affected. Delayed onset was common. Neurological presentation was typical for stroke, but a significant minority had otherwise 'silent strokes'. The most common psychosis was delusional disorder, followed by schizophrenia-like psychosis and mood disorder with psychotic features. Estimated delusion prevalence was 4.67% (95% CI 2.30% to 7.79%) and hallucinations 5.05% (95% CI 1.84% to 9.65%). Twelve-year incidence was 6.7%. No systematic treatment studies were found. Case studies frequently report symptom remission after antipsychotics, but serious concerns about under-representation of poor outcome remain. Lesions were typically right hemisphere, particularly frontal, temporal and parietal regions, and the right caudate nucleus. In general, poststroke psychosis was associated with poor functional outcomes and high mortality. Poor methodological quality of studies was a  significant limitation. Psychosis considerably adds to illness burden of stroke. Delayed onset suggests a window for early intervention. Studies on the safety and efficacy of antipsychotics in this population are urgently needed.  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 Treatment for post-stroke psychosis is limited in this staggering medical concern. The most commonly used medications are antipsychotics, however, the risk for stroke increases further with the use of antipsychotics. Furthermore, interventional clinical studies have not been carried out to test the efficacy and safety of antipsychotics in the management of post-stroke psychosis. 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 More clinical investigations are needed to address the pathology associated with the clinical presentation and exploring the pharmacotherapies to improve efficacy and safety of treatment for post-stroke psychosis.  Keywords: stroke, post stroke psychosis, long term neuropsychiatric sequelae, psychosis Go to: Introduction

## 2021-10-09 NOTE — Group Note (Signed)
Date:  10/09/2021 Time:  9:07 AM  Group Topic/Focus:  Orientation:   The focus of this group is to educate the patient on the purpose and policies of crisis stabilization and provide a format to answer questions about their admission.  The group details unit policies and expectations of patients while admitted.    Participation Level:  Active  Participation Quality:  Appropriate  Affect:  Appropriate  Cognitive:  Appropriate  Insight: Appropriate  Engagement in Group:  Engaged  Modes of Intervention:  Discussion  Additional Comments:    Paul Bray 10/09/2021, 9:07 AM

## 2021-10-09 NOTE — B[facility] Group Notes (Signed)
[place] Group Notes:  (Nursing/MHT/Case Management/Adjunct)  Date:  10/09/2021  Time:  11:05 AM  Type of Therapy:  Group Therapy: Goals Group  Participation Level:  Active  Participation Quality:  Appropriate  Affect:  Appropriate  Cognitive:  Appropriate  Insight:  Appropriate  Engagement in Group:  Engaged  Modes of Intervention:  Discussion  Summary of Progress/Problems:  Patient attended and participated in goals group today. Patient's goal for today is to get his medication regulated.   Frances Furbish R Jamyra Zweig 10/09/2021, 11:05 AM

## 2021-10-09 NOTE — Progress Notes (Signed)
[facility] MD Progress Note  10/09/2021 2:24 PM Paul Bray  MRN:  [id number]  Subjective:  [name] reports, "I am still hearing the voices, but since the last dose of Haldol this morning, they are less intense, and are dulling, and I am able to focus more. One of the voices brought up the fact that I locked up my credit report. They figured a way to unlock it. If there is a bunch of student loan applications when I get out, then I'll know someone is tapping into my human frequency. I'm trying to get the voices to tell me something that I don't already know."  Reason for admission: 56 y.o. Caucasian  male patient admitted with hx significant for schizophrenia, paranoid personality, delusional disorder, severe opioid dependence, alcohol use disorder, bipolar disorder, substance induced mood disorder.  Pt presented to the [facility] ED with complaints of worsening auditory & visual hallucinations, paranoia, and +SI, with a plan to "overmedicate" himself to death. He was on MAT (Suboxone) for Opioid abuse, & reported withdrawal symptoms due to noncompliance. Pt was involuntarily transferred to this [facility] for treatment and stabilization of his mental health symptoms.  Today's patient assessment Note: Paul Bray is seen, chart reviewed. The chart findings discussed with the treatment team. Pt is seen in his room on the 400 hall while sitting on his bed. Pt presents today with with a euthymic mood, and affect is congruent and appropriate. His attention to personal hygiene and grooming is fair, eye contact is good, speech is clear & coherent. Thought are organized, but he continues to have some illogical contents. He denies SI/HI/VH, but continues to report +AH, paranoia, and presents with delusional thoughts.    Pt reports that since his last dose of Haldol earlier in the morning, the voices have decreased in intensity. He reports that one of the voices told him that he locked his identity, but they are still  trying to steal his identity, and have found a way to unlock it. He reports that if there are a lot of applications for student loans at his apartment when he gets discharged, then he will know that the voices have truly found a way to tap into his "human frequency". He reports that he is trying to make the voices tell him things that he does not already know so that he can determine if the voices are true or not.   Will continue Haldol at 10 mg BID since pt seems to have made a slight improvement on this medication. Pt reported earlier in the morning that his urine stream continued to be weak even with the increase in the dose of his Flomax to 0.8 mg and the discontinuation of the Cogentin. Writer checked again with pt this afternoon, and he reports that his urine flow has improved and he is urinating more with ease. He reports a good sleep quality last night and is reporting a good appetite. He denies any medication related side effects, no tremors noted. AIMS-0. Will continue all meds as listed below. BP noted to be slightly low earlier this morning & hydration is being encouraged. Pt denies lightheadedness/dizziness. Nursing staff asked to repeat vital signs.  Principal Problem: Undifferentiated schizophrenia ([place])  Diagnosis: Principal Problem:   Undifferentiated schizophrenia ([place]) Active Problems:   Paranoid schizophrenia ([place])   Insomnia   Opioid use disorder   Stimulant use disorder   Nicotine dependence   GAD (generalized anxiety disorder)  Total Time spent with patient:  35 minutes  Past Psychiatric History: See H&P  Past Medical History:  Past Medical History:  Diagnosis Date   ADHD    Anxiety    Brain tumor ([place])    balance and occ memory issues and headaches   Brain tumor ([place])    sees [facility] q year   Depression    Headache    migraine and tension   Hypothyroidism    Soft tissue mass    left shoulder   Substance abuse ([place])     Past Surgical History:   Procedure Laterality Date   colonscopy     gamma knife radiation 2018 for brain tumor'     hematoma removed from arm Left    MASS EXCISION Left 06/30/2019   Procedure: EXCISION OF SOFT TISSUE  MASS LEFT SHOULDER;  Surgeon: Armandina Gemma, MD;  Location: [facility];  Service: General;  Laterality: Left;  LMA   Family History: History reviewed. No pertinent family history.  Family Psychiatric  History: See H&P  Social History:  Social History   Substance and Sexual Activity  Alcohol Use Not Currently   Comment: quit Oct 2019     Social History   Substance and Sexual Activity  Drug Use Not Currently   Types: Cocaine, IV, Heroin, MDMA (Ecstacy)   Comment: last reported use; cocaine and heroin in 2019; MDMA Feb 2023    Social History   Socioeconomic History   Marital status: Single    Spouse name: Not on file   Number of children: Not on file   Years of education: Not on file   Highest education level: Not on file  Occupational History   Not on file  Tobacco Use   Smoking status: Former    Types: Cigarettes   Smokeless tobacco: Current   Tobacco comments:    quit jan 2020  Vaping Use   Vaping Use: Every day  Substance and Sexual Activity   Alcohol use: Not Currently    Comment: quit Oct 2019   Drug use: Not Currently    Types: Cocaine, IV, Heroin, MDMA (Ecstacy)    Comment: last reported use; cocaine and heroin in 2019; MDMA Feb 2023   Sexual activity: Not Currently  Other Topics Concern   Not on file  Social History Narrative   Not on file   Social Determinants of Health   Financial Resource Strain: Not on file  Food Insecurity: Not on file  Transportation Needs: Not on file  Physical Activity: Not on file  Stress: Not on file  Social Connections: Not on file   Additional Social History:   Sleep: Good  Appetite:  Good  Current Medications: Current Facility-Administered Medications  Medication Dose Route Frequency Provider Last Rate  Last Admin   acetaminophen (TYLENOL) tablet 500 mg  500 mg Oral BID PRN Merlyn Lot E, NP   500 mg at 10/04/21 0826   amantadine (SYMMETREL) capsule 100 mg  100 mg Oral BID Lindell Spar I, NP   100 mg at 10/09/21 0757   haloperidol (HALDOL) tablet 5 mg  5 mg Oral Q6H PRN Nicholes Rough, NP       And   benztropine (COGENTIN) tablet 1 mg  1 mg Oral Q6H PRN Nicholes Rough, NP       buprenorphine (SUBUTEX) sublingual tablet 8 mg  8 mg Sublingual TID Merlyn Lot E, NP   8 mg at 10/09/21 1152   gabapentin (NEURONTIN) capsule 400 mg  400 mg Oral BID Nicholes Rough, NP  400 mg at 10/09/21 0801   haloperidol (HALDOL) tablet 10 mg  10 mg Oral QHS Nicholes Rough, NP   10 mg at 10/08/21 2033   haloperidol (HALDOL) tablet 10 mg  10 mg Oral Daily Lindell Spar I, NP   10 mg at 10/09/21 0755   nicotine polacrilex (NICORETTE) gum 2 mg  2 mg Oral PRN Merlyn Lot E, NP   2 mg at 10/09/21 1154   polyethylene glycol (MIRALAX / GLYCOLAX) packet 17 g  17 g Oral Daily Merlyn Lot E, NP   17 g at 10/09/21 0756   propranolol (INDERAL) tablet 10 mg  10 mg Oral TID Lindell Spar I, NP   10 mg at 10/09/21 0759   tamsulosin (FLOMAX) capsule 0.8 mg  0.8 mg Oral QPC supper Lindell Spar I, NP   0.8 mg at 10/08/21 1647   thyroid (ARMOUR) tablet 90 mg  90 mg Oral Daily Merlyn Lot E, NP   90 mg at 10/09/21 0757   traZODone (DESYREL) tablet 100 mg  100 mg Oral QHS Nicholes Rough, NP   100 mg at 10/08/21 2033   Lab Results:  No results found for this or any previous visit (from the past 48 hour(s)).  Blood Alcohol level:  Lab Results  Component Value Date   ETH <10 09/27/2021   ETH 41 (H) 00/17/4944   Metabolic Disorder Labs: Lab Results  Component Value Date   HGBA1C 5.0 10/03/2021   MPG 96.8 10/03/2021   No results found for: "PROLACTIN" Lab Results  Component Value Date   CHOL 191 10/03/2021   TRIG 92 10/03/2021   HDL 48 10/03/2021   CHOLHDL 4.0 10/03/2021   VLDL 18 10/03/2021   LDLCALC 125 (H)  10/03/2021   Physical Findings: AIMS: Facial and Oral Movements Muscles of Facial Expression: None, normal Lips and Perioral Area: None, normal Jaw: None, normal Tongue: None, normal,Extremity Movements Upper (arms, wrists, hands, fingers): None, normal Lower (legs, knees, ankles, toes): None, normal, Trunk Movements Neck, shoulders, hips: None, normal, Overall Severity Severity of abnormal movements (highest score from questions above): None, normal Incapacitation due to abnormal movements: None, normal Patient's awareness of abnormal movements (rate only patient's report): No Awareness, Dental Status Current problems with teeth and/or dentures?: No Does patient usually wear dentures?: No  CIWA:   n/a COWS:   n/a AIMS:0  Musculoskeletal: Strength & Muscle Tone: within normal limits Gait & Station: normal Patient leans: N/A  Psychiatric Specialty Exam:  Presentation  General Appearance: Appropriate for Environment; Fairly Groomed  Eye Contact:Good  Speech:Clear and Coherent  Speech Volume:Normal  Handedness:Right  Mood and Affect  Mood:Euthymic  Affect:Appropriate  Thought Process  Thought Processes:Coherent  Descriptions of Associations:Intact  Orientation:Full (Time, Place and Person)  Thought Content:Illogical  History of Schizophrenia/Schizoaffective disorder:Yes  Duration of Psychotic Symptoms:Greater than six months  Hallucinations:Hallucinations: Auditory Description of Auditory Hallucinations: voices telling him that they are stealing his identity   Ideas of Reference:Paranoia; Delusions  Suicidal Thoughts:Suicidal Thoughts: No   Homicidal Thoughts:Homicidal Thoughts: No   Sensorium  Memory:Immediate Good  Judgment:Fair  Insight:Fair  Executive Functions  Concentration:Good  Attention Span:Good  [place]  Language:Good  Psychomotor Activity  Psychomotor Activity:Psychomotor Activity:  Normal   Assets  Assets:Communication Skills  Sleep  Sleep:Sleep: Good   Physical Exam: Physical Exam Vitals and nursing note reviewed.  HENT:     Nose: Nose normal.     Mouth/Throat:     Pharynx: Oropharynx is clear.  Cardiovascular:  Rate and Rhythm: Normal rate.     Pulses: Normal pulses.  Pulmonary:     Effort: Pulmonary effort is normal.  Musculoskeletal:        General: Normal range of motion.     Cervical back: Normal range of motion.  Skin:    General: Skin is warm.  Neurological:     General: No focal deficit present.     Mental Status: He is alert and oriented to person, place, and time.   Review of Systems  Constitutional:  Negative for chills, diaphoresis and fever.  HENT:  Negative for congestion and sore throat.   Respiratory:  Negative for shortness of breath and wheezing.   Cardiovascular:  Negative for chest pain and palpitations.  Gastrointestinal:  Negative for abdominal pain, constipation, diarrhea, heartburn, nausea and vomiting.  Neurological:  Negative for dizziness, tingling, tremors, sensory change, speech change, focal weakness, seizures, loss of consciousness, weakness and headaches.  Psychiatric/Behavioral:  Positive for hallucinations (+AH improving with medications) and substance abuse. Negative for depression, memory loss and suicidal ideas. The patient is nervous/anxious (improving on current medications) and has insomnia (improving with medications).    Blood pressure (!) 94/58, pulse 88, temperature 98.5 F (36.9 C), temperature source Oral, resp. rate 16, height '5\' 10"'$  (1.778 m), weight 88.5 kg, SpO2 100 %. Body mass index is 27.99 kg/m.  Treatment Plan Summary: Daily contact with patient to assess and evaluate symptoms and progress in treatment and Medication management.   Continue inpatient hospitalization.  Will continue today 10/09/2021 plan as below except where it is noted.   PLAN Safety and Monitoring: Voluntary admission  to inpatient psychiatric unit for safety, stabilization and treatment Daily contact with patient to assess and evaluate symptoms and progress in treatment Patient's case to be discussed in multi-disciplinary team meeting Observation Level : q15 minute checks Vital signs: q12 hours Precautions: Safety                           Diagnoses Principal Problem: Undifferentiated schizophrenia ([place]) Diagnosis: Principal Problem:   Undifferentiated schizophrenia ([place]) Active Problems:   Paranoid schizophrenia ([place])   Insomnia   Opioid use disorder   Stimulant use disorder   Nicotine dependence   GAD (generalized anxiety disorder)  Medications -Continue Haldol 10 mg PO BID for psychosis  - Discontinued Cogentin 0.5 mg BID for EPS Prophylaxis due to Hx. BPH. - Continue Amantadine 100 mg po bid for eps. - Continue gabapentin to 400 mg po bid for anxiety/restless legs. - Continue Trazodone to '100mg'$  po Q hs routinely for insomnia.   - Continue Nicotine 21 mg trans-dermally Q 24 hrs for nicotine withdrawal. - Continue Nicorette gum 2 mg po prn for nicotine withdrawal prn - Continue Agitation Protocol PRN Q6 H (Haldol and Cogentin PRN as per [facility])   Other medical issues. - Increased Flomax  to 0.8 mg po Q hs for prostate health.  - Administered Flomax 0.4 mg po once today for BPH. - Continue Thyroid (Armour) 90 mg po daily for hypothyroidism.  - Continue Miralax 17 gm po daily for constipation.   Other prn medications.  - acetaminophen 500 mg po bid prn for pain/fever. - Mylanta 30 ml po Q 6 hours prn for indigestion.  - MOM 30 ml po q daily prn for constipation.   Discharge Planning: Social work and case management to assist with discharge planning and identification of hospital follow-up needs prior to discharge Estimated LOS: 5-7  days Discharge Concerns: Need to establish a safety plan; Medication compliance and effectiveness Discharge Goals: Return home with outpatient referrals for  mental health follow-up including medication management/psychotherapy  Observation Level/Precautions:  15 minute checks  Laboratory:  Labs reviewed   Psychotherapy:  Unit Group sessions  Medications:  See [facility]  Consultations:  To be determined   Discharge Concerns:  Safety, medication compliance, mood stability  Estimated LOS: 5-7 days  Other:  N/A   Nicholes Rough, NP, pmhnp, fnp-bc 10/09/2021, 2:24 PMPatient ID: Paul Bray, male   DOB: [birth year], 56 y.o.   MRN: [id number]

## 2021-10-09 NOTE — Plan of Care (Signed)
Nurse discussed coping skills with patient.  

## 2021-10-09 NOTE — Progress Notes (Signed)
D:  Patient's self inventory sheet, patient sleeps good, sleep medication helpful.  Good appetite, low energy level, good concentration.  Rated depression, and hopeless and anxiety 5.  Denied withdrawals.  Denied SI.  Denied physical problems.  Has experienced headache in past 24 hours, worst pain #5,  Goal is balance medications.  Plans to take medication.  No discharge plans. A:  Medications administered  per MD orders.  Emotional support and encouragement given patient. R:  Denied SI and HI, contracts for safety.  Denied A/V hallucinations.  Safety maintained with 15 minute checks.

## 2021-10-09 NOTE — Hospital Course (Signed)
Additional Documentation   References to Addendum 09/25/2021 Poststroke psychosis: a systematic review Helle Stangeland,1,2 Vasiliki Orgeta,2 and Randa Spike information Article notes Copyright and License information Disclaimer Associated Data Supplementary Materials Go to: Abstract A preregistered systematic review of poststroke psychosis examining clinical characteristics, prevalence, diagnostic procedures, lesion location, treatments, risk factors and outcome. Neuropsychiatric outcomes following stroke are common and severely impact quality of life. No previous reviews have focused on poststroke psychosis despite clear clinical need. CINAHL, MEDLINE and PsychINFO were searched for studies on poststroke psychosis published between 1975 and 2016. Reviewers independently selected studies for inclusion, extracted data and rated study quality. Out of 2442 references, 76 met inclusion criteria. Average age for poststroke psychosis was 66.6 years with slightly more males than females affected. Delayed onset was common. Neurological presentation was typical for stroke, but a significant minority had otherwise 'silent strokes'. The most common psychosis was delusional disorder, followed by schizophrenia-like psychosis and mood disorder with psychotic features. Estimated delusion prevalence was 4.67% (95% CI 2.30% to 7.79%) and hallucinations 5.05% (95% CI 1.84% to 9.65%). Twelve-year incidence was 6.7%. No systematic treatment studies were found. Case studies frequently report symptom remission after antipsychotics, but serious concerns about under-representation of poor outcome remain. Lesions were typically right hemisphere, particularly frontal, temporal and parietal regions, and the right caudate nucleus. In general, poststroke psychosis was associated with poor functional outcomes and high mortality. Poor methodological quality of studies was a significant limitation. Psychosis considerably adds to  illness burden of stroke. Delayed onset suggests a window for early intervention. Studies on the safety and efficacy of antipsychotics in this population are urgently needed.   Keywords: stroke, neuropsychiatry, hallucinations, schizophrenia   Dilemma of Treating Psychosis Secondary to Stroke Monitoring Editor: Barbette Hair and Wilber Oliphant Ruchita Agrawal,1 Shikha Verma,2,3 Vatsalya Vatsalya,4 [place] and Kosis[place]wu Orakacorresponding Awilda Bill Chief Strategy Officer information Article notes Copyright and License information Disclaimer Go to: Abstract Post-stroke psychosis is prevalent and disabling with increased mortality risk. Treatment for post-stroke psychosis is limited in this staggering medical concern. The most commonly used medications are antipsychotics, however, the risk for stroke increases further with the use of antipsychotics. Furthermore, interventional clinical studies have not been carried out to test the efficacy and safety of antipsychotics in the management of post-stroke psychosis. 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 More clinical investigations are needed to address the pathology associated with the clinical presentation and exploring the pharmacotherapies to improve efficacy and safety of treatment for post-stroke psychosis.   Keywords: stroke, post stroke psychosis, long term neuropsychiatric sequelae, psychosis Go to: Introduction        Electronically signed by Dara Hoyer, PA-C at 10/09/2021  1:50 PM

## 2021-10-09 NOTE — Progress Notes (Signed)
Pt affect flat, mood anxious, rated his day a "5" and his goal was to work on medication management. Pt reports he has been sleeping better at night. Currently denies SI/HI and had heard voices earlier of mumbling. (A) 15 min checks (r) safety maintained.

## 2021-10-10 NOTE — [facility] Group Notes (Signed)
[place] Group Notes:  (Nursing/MHT/Case Management/Adjunct)  Date:  10/10/2021  Time:  12:03 PM  Type of Therapy:  Psychoeducational Skills   Participation Level:  Active   Participation Quality:  Appropriate   Affect:  Appropriate   Cognitive:  Appropriate   Insight:  Appropriate   Engagement in Group:  Engaged   Modes of Intervention:  Discussion   Summary of Progress/Problems:   Patient attended and participated in a psycho-educational group involving self care. Patient's were asked to three questions involving self care.    What is a self care routine that you are already doing? What is a self care routine that you have recently picked up? What is a self care routine that you would like to start doing?     Daily showers Piano  Being more consistence with things such as the piano   Elza Rafter 10/10/2021, 12:03 PM

## 2021-10-10 NOTE — Progress Notes (Signed)
Pt rates depression 5/10 and anxiety 5/10 and that "it is not good or bad, just in the middle". Pt reports a good appetite, and no pain. Pt does report problems urinating but that the meds are helping. Pt reports AH of voices mumbling, denies SI/HI/VH and verbally contracts for safety. Provided support and encouragement. Pt safe on the unit. Q 15 minute safety checks continued.

## 2021-10-10 NOTE — Group Note (Signed)
LCSW Group Therapy Note   Group Date: 10/10/2021 Start Time: 1300 End Time: 1400  Type of Therapy and Topic:  Group Therapy - Healthy vs Unhealthy Coping Skills  Participation Level:  Did Not Attend   Description of Group The focus of this group was to determine what unhealthy coping techniques typically are used by group members and what healthy coping techniques would be helpful in coping with various problems. Patients were guided in becoming aware of the differences between healthy and unhealthy coping techniques. Patients were asked to identify 2-3 healthy coping skills they would like to learn to use more effectively.  Therapeutic Goals Patients learned that coping is what human beings do all day long to deal with various situations in their lives Patients defined and discussed healthy vs unhealthy coping techniques Patients identified their preferred coping techniques and identified whether these were healthy or unhealthy Patients determined 2-3 healthy coping skills they would like to become more familiar with and use more often. Patients provided support and ideas to each other   Summary of Patient Progress:  Did not attend    Therapeutic Modalities Cognitive Behavioral Therapy Motivational Interviewing  Darleen Crocker, Latanya Presser 10/10/2021  1:47 PM

## 2021-10-10 NOTE — Progress Notes (Signed)
   10/10/21 0800  Psych Admission Type (Psych Patients Only)  Admission Status Voluntary  Psychosocial Assessment  Patient Complaints Anxiety;Depression  Eye Contact Fair  Facial Expression Anxious  Affect Anxious  Speech Logical/coherent  Interaction Assertive  Motor Activity Fidgety  Appearance/Hygiene Unremarkable  Behavior Characteristics Cooperative  Mood Anxious;Depressed  Thought Process  Coherency WDL  Content WDL  Delusions None reported or observed  Perception Hallucinations  Hallucination Auditory  Judgment Poor  Confusion None  Danger to Self  Current suicidal ideation? Denies  Danger to Others  Danger to Others None reported or observed

## 2021-10-10 NOTE — Progress Notes (Signed)
[facility] MD Progress Note  10/10/2021 2:07 PM DWYNE HASEGAWA  MRN:  [id number]  Subjective:  [place] reports, "I'm doing alright. I'm kind of anxious because I want to get out of the hospital. The voices are a lot less today than they were. I'm unable to make out what they were saying".  Reason for admission: 56 y.o. Caucasian  male patient admitted with hx significant for schizophrenia, paranoid personality, delusional disorder, severe opioid dependence, alcohol use disorder, bipolar disorder, substance induced mood disorder.  Pt presented to the [facility] ED with complaints of worsening auditory & visual hallucinations, paranoia, and +SI, with a plan to "overmedicate" himself to death. He was on MAT (Suboxone) for Opioid abuse, & reported withdrawal symptoms due to noncompliance. Pt was involuntarily transferred to this [facility] for treatment and stabilization of his mental health symptoms.  Today's patient assessment Note: Odai is seen, chart reviewed. The chart findings discussed with the treatment team. Pt is seen in his room on the 400 hall. He was lying down in his bed, but able to sit up on the side of the bed during this follow-up care evaluation. Kanaan presents today with a bright/cheerful affect. He maintained a good eye contact throughout this assessment. As I had seen & evaluation this patient at least 2-3 times prior to this evaluation today, this is the best of himself that I have observed so far. His outlook & mannerism has improved remarkably. He believes he is doing much better as well. He is currently talking about/anticipating discharge. He reports some anxiety symptoms that he rates #5 only because he is still here in the hospital. He reports that the voices has improved remarkably that he is unable to make-out what the voices are currently saying. He did complain of feeling fatigued. We had a discussion on how inactivity can contribute to fatigue. Zakary is explained that because  he feels overwhelmed whenever he attended group sessions, he resorted to brief walk on the hallway from time to time & back in his room lying down in bed. That this could be also contributing to his fatigue. He is encouraged to come out of his room, try to attend group sessions now that the voices are not as bothersome. He is encouraged even after discharge to try some daily activities like brisk walk in the morning/evenings when the weather is not so hot to get some fresh air as this might create some distraction from him focusing too much on his mental illness. Patient is in agreement & thinks this idea may benefit him moving forward. He currently denies any SIHI, VH, delusional thoughts or paranoia. He does not appear to be responding to any internal stimuli. We will continue current plan of care as already in progress. Reviewed current labe results, no changes. Vital signs remain stable.  Principal Problem: Undifferentiated schizophrenia ([place])  Diagnosis: Principal Problem:   Undifferentiated schizophrenia ([place]) Active Problems:   Paranoid schizophrenia ([place])   Insomnia   Opioid use disorder   Stimulant use disorder   Nicotine dependence   GAD (generalized anxiety disorder)  Total Time spent with patient:  35 minutes  Past Psychiatric History: See H&P  Past Medical History:  Past Medical History:  Diagnosis Date   ADHD    Anxiety    Brain tumor ([place])    balance and occ memory issues and headaches   Brain tumor ([place])    sees [facility] q year   Depression    Headache  migraine and tension   Hypothyroidism    Soft tissue mass    left shoulder   Substance abuse [facility])     Past Surgical History:  Procedure Laterality Date   colonscopy     gamma knife radiation 2018 for brain tumor'     hematoma removed from arm Left    MASS EXCISION Left 06/30/2019   Procedure: EXCISION OF SOFT TISSUE  MASS LEFT SHOULDER;  Surgeon: Armandina Gemma, MD;  Location: [place];   Service: General;  Laterality: Left;  LMA   Family History: History reviewed. No pertinent family history.  Family Psychiatric  History: See H&P  Social History:  Social History   Substance and Sexual Activity  Alcohol Use Not Currently   Comment: quit Oct 2019     Social History   Substance and Sexual Activity  Drug Use Not Currently   Types: Cocaine, IV, Heroin, MDMA (Ecstacy)   Comment: last reported use; cocaine and heroin in 2019; MDMA Feb 2023    Social History   Socioeconomic History   Marital status: Single    Spouse name: Not on file   Number of children: Not on file   Years of education: Not on file   Highest education level: Not on file  Occupational History   Not on file  Tobacco Use   Smoking status: Former    Types: Cigarettes   Smokeless tobacco: Current   Tobacco comments:    quit jan 2020  Vaping Use   Vaping Use: Every day  Substance and Sexual Activity   Alcohol use: Not Currently    Comment: quit Oct 2019   Drug use: Not Currently    Types: Cocaine, IV, Heroin, MDMA (Ecstacy)    Comment: last reported use; cocaine and heroin in 2019; MDMA Feb 2023   Sexual activity: Not Currently  Other Topics Concern   Not on file  Social History Narrative   Not on file   Social Determinants of Health   Financial Resource Strain: Not on file  Food Insecurity: Not on file  Transportation Needs: Not on file  Physical Activity: Not on file  Stress: Not on file  Social Connections: Not on file   Additional Social History:   Sleep: Good  Appetite:  Good  Current Medications: Current Facility-Administered Medications  Medication Dose Route Frequency Provider Last Rate Last Admin   acetaminophen (TYLENOL) tablet 500 mg  500 mg Oral BID PRN Merlyn Lot E, NP   500 mg at 10/04/21 0826   amantadine (SYMMETREL) capsule 100 mg  100 mg Oral BID Lindell Spar I, NP   100 mg at 10/10/21 0759   haloperidol (HALDOL) tablet 5 mg  5 mg Oral Q6H PRN Nicholes Rough, NP       And   benztropine (COGENTIN) tablet 1 mg  1 mg Oral Q6H PRN Nicholes Rough, NP       buprenorphine (SUBUTEX) sublingual tablet 8 mg  8 mg Sublingual TID Merlyn Lot E, NP   8 mg at 10/10/21 1311   gabapentin (NEURONTIN) capsule 400 mg  400 mg Oral BID Nicholes Rough, NP   400 mg at 10/10/21 0800   haloperidol (HALDOL) tablet 10 mg  10 mg Oral QHS Nkwenti, Doris, NP   10 mg at 10/09/21 2145   haloperidol (HALDOL) tablet 10 mg  10 mg Oral Daily Lindell Spar I, NP   10 mg at 10/10/21 0800   nicotine polacrilex (NICORETTE) gum 2 mg  2  mg Oral PRN Merlyn Lot E, NP   2 mg at 10/10/21 1308   polyethylene glycol (MIRALAX / GLYCOLAX) packet 17 g  17 g Oral Daily Merlyn Lot E, NP   17 g at 10/10/21 0800   propranolol (INDERAL) tablet 10 mg  10 mg Oral TID Lindell Spar I, NP   10 mg at 10/10/21 0953   tamsulosin (FLOMAX) capsule 0.8 mg  0.8 mg Oral QPC supper Lindell Spar I, NP   0.8 mg at 10/09/21 1900   thyroid (ARMOUR) tablet 90 mg  90 mg Oral Daily Merlyn Lot E, NP   90 mg at 10/10/21 0800   traZODone (DESYREL) tablet 100 mg  100 mg Oral QHS Nicholes Rough, NP   100 mg at 10/09/21 2146   Lab Results:  No results found for this or any previous visit (from the past 48 hour(s)).  Blood Alcohol level:  Lab Results  Component Value Date   ETH <10 09/27/2021   ETH 41 (H) 40/98/1191   Metabolic Disorder Labs: Lab Results  Component Value Date   HGBA1C 5.0 10/03/2021   MPG 96.8 10/03/2021   No results found for: "PROLACTIN" Lab Results  Component Value Date   CHOL 191 10/03/2021   TRIG 92 10/03/2021   HDL 48 10/03/2021   CHOLHDL 4.0 10/03/2021   VLDL 18 10/03/2021   LDLCALC 125 (H) 10/03/2021   Physical Findings: AIMS: Facial and Oral Movements Muscles of Facial Expression: None, normal Lips and Perioral Area: None, normal Jaw: None, normal Tongue: None, normal,Extremity Movements Upper (arms, wrists, hands, fingers): None, normal Lower (legs, knees, ankles,  toes): None, normal, Trunk Movements Neck, shoulders, hips: None, normal, Overall Severity Severity of abnormal movements (highest score from questions above): None, normal Incapacitation due to abnormal movements: None, normal Patient's awareness of abnormal movements (rate only patient's report): No Awareness, Dental Status Current problems with teeth and/or dentures?: No Does patient usually wear dentures?: No  CIWA:    COWS:     Musculoskeletal: Strength & Muscle Tone: within normal limits Gait & Station: normal Patient leans: N/A  Psychiatric Specialty Exam:  Presentation  General Appearance: Appropriate for Environment; Fairly Groomed  Eye Contact:Good  Speech:Clear and Coherent  Speech Volume:Normal  Handedness:Right  Mood and Affect  Mood:Euthymic  Affect:Appropriate  Thought Process  Thought Processes:Coherent  Descriptions of Associations:Intact  Orientation:Full (Time, Place and Person)  Thought Content:Illogical  History of Schizophrenia/Schizoaffective disorder:Yes  Duration of Psychotic Symptoms:Greater than six months  Hallucinations:Hallucinations: Auditory Description of Auditory Hallucinations: voices telling him that they are stealing his identity  Ideas of Reference:Paranoia; Delusions  Suicidal Thoughts:Suicidal Thoughts: No  Homicidal Thoughts:Homicidal Thoughts: No  Sensorium  Memory:Immediate Good  Judgment:Fair  Insight:Fair  Executive Functions  Concentration:Good  Attention Span:Good  [place]  Language:Good  Psychomotor Activity  Psychomotor Activity:Psychomotor Activity: Normal  Assets  Assets:Communication Skills  Sleep  Sleep:Sleep: Good  Physical Exam: Physical Exam Vitals and nursing note reviewed.  HENT:     Nose: Nose normal.     Mouth/Throat:     Pharynx: Oropharynx is clear.  Cardiovascular:     Rate and Rhythm: Normal rate.     Pulses: Normal pulses.  Pulmonary:      Effort: Pulmonary effort is normal.  Musculoskeletal:        General: Normal range of motion.     Cervical back: Normal range of motion.  Skin:    General: Skin is warm.  Neurological:  General: No focal deficit present.     Mental Status: He is alert and oriented to person, place, and time.   Review of Systems  Constitutional:  Negative for chills, diaphoresis and fever.  HENT:  Negative for congestion and sore throat.   Respiratory:  Negative for shortness of breath and wheezing.   Cardiovascular:  Negative for chest pain and palpitations.  Gastrointestinal:  Negative for abdominal pain, constipation, diarrhea, heartburn, nausea and vomiting.  Neurological:  Negative for dizziness, tingling, tremors, sensory change, speech change, focal weakness, seizures, loss of consciousness, weakness and headaches.  Psychiatric/Behavioral:  Positive for hallucinations and substance abuse. Negative for depression, memory loss and suicidal ideas. The patient is nervous/anxious and has insomnia (improving with medications).    Blood pressure 102/77, pulse 68, temperature 98.3 F (36.8 C), temperature source Oral, resp. rate 16, height '5\' 10"'$  (1.778 m), weight 88.5 kg, SpO2 99 %. Body mass index is 27.99 kg/m.  Treatment Plan Summary: Daily contact with patient to assess and evaluate symptoms and progress in treatment and Medication management.   Continue inpatient hospitalization.  Will continue today 10/10/2021 plan as below except where it is noted.   PLAN Safety and Monitoring: Voluntary admission to inpatient psychiatric unit for safety, stabilization and treatment Daily contact with patient to assess and evaluate symptoms and progress in treatment Patient's case to be discussed in multi-disciplinary team meeting Observation Level : q15 minute checks Vital signs: q12 hours Precautions: Safety                           Diagnoses Principal Problem: Undifferentiated schizophrenia  ([place]) Diagnosis: Principal Problem:   Undifferentiated schizophrenia ([place]) Active Problems:   Paranoid schizophrenia ([place])   Opioid use disorder   Stimulant use disorder   Nicotine dependence   GAD (generalized anxiety disorder)  Medications - Continue haldol 10 mg po  in am for psychosis  - Continue Haldol 10 mg Q HS for psychosis - Discontinued Cogentin 0.5 mg BID for EPS due to Hx. BPH. - Continue Amantadine 100 mg po bid for eps. - Continue gabapentin 400 mg po bid for anxiety/restless legs. - Continue Trazodone '100mg'$  po Q hs routinely for insomnia.   - Continue Propranolol 10 mg po tid for anxiety. - Continue Nicotine 21 mg trans-dermally Q 24 hrs for nicotine withdrawal. - Continue Nicorette gum 2 mg po prn for nicotine withdrawal prn - Continue Agitation Protocol PRN Q6 H (Haldol and Cogentin PRN as per [facility])   Other medical issues. - Continue Flomax 0.8 mg po Q hs for prostate health.  - Continue Thyroid ([place]) 90 mg po daily for hypothyroidism.  - Continue Miralax 17 gm po daily for constipation.   Other prn medications.  - acetaminophen 500 mg po bid prn for pain/fever. - Mylanta 30 ml po Q 6 hours prn for indigestion.  - MOM 30 ml po q daily prn for constipation.   Discharge Planning: Social work and case management to assist with discharge planning and identification of hospital follow-up needs prior to discharge Estimated LOS: 5-7 days Discharge Concerns: Need to establish a safety plan; Medication compliance and effectiveness Discharge Goals: Return home with outpatient referrals for mental health follow-up including medication management/psychotherapy  Observation Level/Precautions:  15 minute checks  Laboratory:  Labs reviewed   Psychotherapy:  Unit Group sessions  Medications:  See [facility]  Consultations:  To be determined   Discharge Concerns:  Safety, medication compliance, mood stability  Estimated LOS: 5-7 days  Other:  N/A   Lindell Spar, NP, pmhnp,  fnp-bc 10/10/2021, 2:07 PMPatient ID: [name], male   DOB: 07-11-1965, 56 y.o.   MRN: [id number] Patient ID: BONHAM ZINGALE, male   DOB: 1965-08-04, 56 y.o.   MRN: [id number] Patient ID: ARIAS WEINERT, male   DOB: 1965/06/21, 56 y.o.   MRN: [id number]

## 2021-10-10 NOTE — Group Note (Signed)
Recreation Therapy Group Note   Group Topic:Stress Management  Group Date: 10/10/2021 Start Time: 0930 End Time: 0950 Facilitators: Victorino Sparrow, LRT,CTRS Location: [address] Dayroom   Goal Area(s) Addresses:  Patient will actively participate in stress management techniques presented during session.  Patient will successfully identify benefit of practicing stress management post d/c.   Group Description:  Guided Imagery. LRT provided education, instruction, and demonstration on practice of visualization via guided imagery. Patient was asked to participate in the technique introduced during session. LRT debriefed including topics of mindfulness, stress management and specific scenarios each patient could use these techniques. Patients were given suggestions of ways to access scripts post d/c and encouraged to explore Youtube and other apps available on smartphones, tablets, and computers.   Affect/Mood: Appropriate   Participation Level: Active   Participation Quality: Independent   Behavior: Appropriate   Speech/Thought Process: Focused   Insight: Good   Judgement: Good   Modes of Intervention: Script, Nature Sounds   Patient Response to Interventions:  Engaged   Education Outcome:  Acknowledges education and In group clarification offered    Clinical Observations/Individualized Feedback: Pt attended and participated in group session.     Plan: Continue to engage patient in RT group sessions 2-3x/week.   Victorino Sparrow, LRT,CTRS  10/10/2021 12:08 PM

## 2021-10-10 NOTE — BHH Group Notes (Signed)
[place] Group Notes:  (Nursing/MHT/Case Management/Adjunct)  Date:  10/10/2021  Time:  10:54 AM  Type of Therapy:  Group Therapy   Participation Level:  Active   Participation Quality:  Appropriate   Affect:  Appropriate   Cognitive:  Appropriate   Insight:  Appropriate   Engagement in Group:  Engaged   Modes of Intervention:  Discussion   Summary of Progress/Problems:   Patient attended and participated in an orientation and goals group today. Patient's goal for today is to get his medication regulated.   Elza Rafter 10/10/2021, 10:54 AM

## 2021-10-10 NOTE — Progress Notes (Signed)
Pt attended [place] meeting but left within the first 10 mins.

## 2021-10-11 NOTE — BHH Group Notes (Signed)
.  Psychoeducational Group Note  Date: 10/11/2021 Time: 0900-1000    Goal Setting   Purpose of Group: This group helps to provide patients with the steps of setting a goal that is specific, measurable, attainable, realistic and time specific. A discussion on how we keep ourselves stuck with negative self talk. Homework given for Patients to write 30 positive attributes about themselves.    Participation Level:  Active  Participation Quality:  Appropriate  Affect:  Appropriate  Cognitive:  Appropriate  Insight:  Improving  Engagement in Group:  Engaged  Additional Comments:  Pt attended the group and rates his energy at a 7/10. States that he is here to get his medications adjusted  Paulino Rily

## 2021-10-11 NOTE — Plan of Care (Signed)
  Problem: Education: Goal: Knowledge of General Education information will improve Description: Including pain rating scale, medication(s)/side effects and non-pharmacologic comfort measures Outcome: Progressing   Problem: Health Behavior/Discharge Planning: Goal: Ability to manage health-related needs will improve Outcome: Progressing   Problem: Clinical Measurements: Goal: Ability to maintain clinical measurements within normal limits will improve Outcome: Progressing Goal: Will remain free from infection Outcome: Progressing Goal: Diagnostic test results will improve Outcome: Progressing Goal: Respiratory complications will improve Outcome: Progressing Goal: Cardiovascular complication will be avoided Outcome: Progressing   Problem: Activity: Goal: Risk for activity intolerance will decrease Outcome: Progressing   Problem: Nutrition: Goal: Adequate nutrition will be maintained Outcome: Progressing   Problem: Coping: Goal: Level of anxiety will decrease Outcome: Progressing   Problem: Elimination: Goal: Will not experience complications related to bowel motility Outcome: Progressing Goal: Will not experience complications related to urinary retention Outcome: Progressing   Problem: Pain Managment: Goal: General experience of comfort will improve Outcome: Progressing   Problem: Safety: Goal: Ability to remain free from injury will improve Outcome: Progressing   Problem: Skin Integrity: Goal: Risk for impaired skin integrity will decrease Outcome: Progressing   Problem: Activity: Goal: Will verbalize the importance of balancing activity with adequate rest periods Outcome: Progressing   Problem: Education: Goal: Will be free of psychotic symptoms Outcome: Progressing Goal: Knowledge of the prescribed therapeutic regimen will improve Outcome: Progressing   Problem: Coping: Goal: Coping ability will improve Outcome: Progressing Goal: Will verbalize  feelings Outcome: Progressing   Problem: Health Behavior/Discharge Planning: Goal: Compliance with prescribed medication regimen will improve Outcome: Progressing   Problem: Nutritional: Goal: Ability to achieve adequate nutritional intake will improve Outcome: Progressing   Problem: Role Relationship: Goal: Ability to communicate needs accurately will improve Outcome: Progressing Goal: Ability to interact with others will improve Outcome: Progressing   Problem: Safety: Goal: Ability to redirect hostility and anger into socially appropriate behaviors will improve Outcome: Progressing Goal: Ability to remain free from injury will improve Outcome: Progressing   Problem: Self-Care: Goal: Ability to participate in self-care as condition permits will improve Outcome: Progressing   Problem: Self-Concept: Goal: Will verbalize positive feelings about self Outcome: Progressing

## 2021-10-11 NOTE — Progress Notes (Signed)
Psychoeducational Group Note  Date:  10/11/2021 Time:  2015  Group Topic/Focus:  Wrap up group  Participation Level: Did Not Attend  Participation Quality:  Not Applicable  Affect:  Not Applicable  Cognitive:  Not Applicable  Insight:  Not Applicable  Engagement in Group: Not Applicable  Additional Comments:  Did not attend.   Shellia Cleverly 10/11/2021, 9:40 PM

## 2021-10-11 NOTE — Group Note (Signed)
Date:  10/11/2021 Time:  10:43 AM  Group Topic/Focus:  Orientation:   The focus of this group is to educate the patient on the purpose and policies of crisis stabilization and provide a format to answer questions about their admission.  The group details unit policies and expectations of patients while admitted.    Participation Level:  Did Not Attend  Participation Quality:    Affect:    Cognitive:    Insight:   Engagement in Group:    Modes of Intervention:    Additional Comments:    Garvin Fila 10/11/2021, 10:43 AM

## 2021-10-11 NOTE — BHH Group Notes (Signed)
    Purpose of Group: . The group focus' on teaching patients on how to identify their needs and their Life Skills:  A group where two lists are made. What people need and what are things that we do that are unhealthy. The lists are developed by the patients and it is explained that we often do the actions that are not healthy to get our list of needs met.  Goal:: to develop the coping skills needed to get their needs met  Participation Level:  Did not attend  Cherolyn Behrle A. Lorey Pallett RN  

## 2021-10-11 NOTE — Group Note (Signed)
LCSW Group Therapy Note  10/11/2021   10:00-11:00am   Type of Therapy and Topic:  Group Therapy: Anger Cues and Responses  Participation Level:  Did Not Attend   Description of Group:   In this group, patients learned how to recognize the physical, cognitive, emotional, and behavioral responses they have to anger-provoking situations.  They identified a recent time they became angry and how they reacted.  They analyzed how their reaction was possibly beneficial and how it was possibly unhelpful.  The group discussed a variety of healthier coping skills that could help with such a situation in the future.  They also learned that anger is a second emotion fueled by other feelings and explored their own emotions that may frequently fuel their anger.  Focus was placed on how helpful it is to recognize the underlying emotions to our anger, because working on those can lead to a more permanent solution as well as our ability to focus on the important rather than the urgent.  Therapeutic Goals: Patients will remember their last incident of anger and how they felt emotionally and physically, what their thoughts were at the time, and how they behaved. Patients will identify how their behavior at that time worked for them, as well as how it worked against them. Patients will explore possible new behaviors to use in future anger situations. Patients will learn that anger itself is normal and cannot be eliminated, and that healthier reactions can assist with resolving conflict rather than worsening situations. Patients will learn that anger is a secondary emotion and worked to identify some of the underlying feelings that may lead to anger.  Summary of Patient Progress:  The patient was invited to group, did not attend.  Therapeutic Modalities:   Cognitive Behavioral Therapy  Maretta Los

## 2021-10-11 NOTE — Progress Notes (Signed)
Patient appears pleasant. Patient denies SI/HI/AVH. Pt had a BP of 95/74 and a pulse of 58 and the propanolol was held. Pt states he slept good and has a fair appetite. Pt stated that the nicotine gum was helpful and stated "I am never going back to vaping only the gum". Patient complied with morning medication with no reported side effects. Patient remains safe on Q22mn checks and contracts for safety.      10/11/21 1000  Psych Admission Type (Psych Patients Only)  Admission Status Voluntary  Psychosocial Assessment  Patient Complaints Anxiety  Eye Contact Fair  Facial Expression Anxious  Affect Anxious  Speech Logical/coherent  Interaction Assertive  Motor Activity Pacing  Appearance/Hygiene Unremarkable  Behavior Characteristics Cooperative  Mood Depressed;Anxious  Thought Process  Coherency WDL  Content WDL  Delusions None reported or observed  Perception WDL  Hallucination None reported or observed  Judgment Poor  Confusion None  Danger to Self  Current suicidal ideation? Denies  Danger to Others  Danger to Others None reported or observed

## 2021-10-11 NOTE — Progress Notes (Signed)
   10/10/21 2200  Psych Admission Type (Psych Patients Only)  Admission Status Voluntary  Psychosocial Assessment  Patient Complaints Anxiety  Eye Contact Fair  Facial Expression Anxious  Affect Anxious  Speech Logical/coherent  Interaction Assertive  Motor Activity Pacing  Appearance/Hygiene Unremarkable  Behavior Characteristics Cooperative  Mood Anxious;Depressed  Thought Process  Coherency WDL  Content WDL  Delusions None reported or observed  Perception Hallucinations  Hallucination Auditory  Judgment Poor  Confusion None  Danger to Self  Current suicidal ideation? Denies  Danger to Others  Danger to Others None reported or observed

## 2021-10-11 NOTE — Progress Notes (Signed)
[facility] MD Progress Note  10/11/2021 8:48 AM Paul Bray  MRN:  [id number]  Subjective:  Paul Bray reports, "I'm doing much better. I feel I'm getting ready for discharge. I'm doing well on the medicines. I can't say the voices are completely gone, but I have not heard any since yesterday. I feel great".  Reason for admission: 56 y.o. Caucasian  male patient admitted with hx significant for schizophrenia, paranoid personality, delusional disorder, severe opioid dependence, alcohol use disorder, bipolar disorder, substance induced mood disorder.  Pt presented to the [facility] ED with complaints of worsening auditory & visual hallucinations, paranoia, and +SI, with a plan to "overmedicate" himself to death. He was on MAT (Suboxone) for Opioid abuse, & reported withdrawal symptoms due to noncompliance. Pt was involuntarily transferred to this [facility] for treatment and stabilization of his mental health symptoms.  Today's patient assessment Note: Paul Bray is seen, chart reviewed. The chart findings discussed with the treatment team. Pt is seen in his room on the 400 hall. He was lying down in his bed, but able to sit up on the side of the bed during this follow-up care evaluation. Paul Bray presents today with a bright/cheerful affect just like he did yesterday. He maintained a good eye contact throughout this assessment. As I had seen & evaluated this patient at least 2-3 times prior to this evaluation today, he seems to be doing better on daily basis. His outlook & mannerism has improved remarkably. He believes he is doing much better as well. He is currently talking about/anticipating discharge. He reports that the voices has improved remarkably that he has not heard them since yesterday.  He is encouraged to come out of his room, try to attend group sessions now that the voices are not as bothersome. He is encouraged even after discharge to try some daily activities like brisk walk in the morning/evenings  when the weather is not so hot to get some fresh air as this might create some distraction from him focusing too much on his mental illness. Patient is in agreement & thinks this idea may benefit him moving forward. He currently denies any SIHI, AVH, delusional thoughts or paranoia. He does not appear to be responding to any internal stimuli. We will continue current plan of care as already in progress. Reviewed current labe results, no changes. Vital signs remain stable. Planned discharge in on Monday as he continues to improve on daily basis.  Principal Problem: Undifferentiated schizophrenia ([place])  Diagnosis: Principal Problem:   Undifferentiated schizophrenia ([place]) Active Problems:   Paranoid schizophrenia ([place])   Insomnia   Opioid use disorder   Stimulant use disorder   Nicotine dependence   GAD (generalized anxiety disorder)  Total Time spent with patient:  35 minutes  Past Psychiatric History: See H&P  Past Medical History:  Past Medical History:  Diagnosis Date   ADHD    Anxiety    Brain tumor ([place])    balance and occ memory issues and headaches   Brain tumor ([place])    sees [facility] q year   Depression    Headache    migraine and tension   Hypothyroidism    Soft tissue mass    left shoulder   Substance abuse ([place])     Past Surgical History:  Procedure Laterality Date   colonscopy     gamma knife radiation 2018 for brain tumor'     hematoma removed from arm Left    MASS EXCISION Left 06/30/2019  Procedure: EXCISION OF SOFT TISSUE  MASS LEFT SHOULDER;  Surgeon: Armandina Gemma, MD;  Location: [place];  Service: General;  Laterality: Left;  LMA   Family History: History reviewed. No pertinent family history.  Family Psychiatric  History: See H&P  Social History:  Social History   Substance and Sexual Activity  Alcohol Use Not Currently   Comment: quit Oct 2019     Social History   Substance and Sexual Activity  Drug Use Not Currently    Types: Cocaine, IV, Heroin, MDMA (Ecstacy)   Comment: last reported use; cocaine and heroin in 2019; MDMA Feb 2023    Social History   Socioeconomic History   Marital status: Single    Spouse name: Not on file   Number of children: Not on file   Years of education: Not on file   Highest education level: Not on file  Occupational History   Not on file  Tobacco Use   Smoking status: Former    Types: Cigarettes   Smokeless tobacco: Current   Tobacco comments:    quit jan 2020  Vaping Use   Vaping Use: Every day  Substance and Sexual Activity   Alcohol use: Not Currently    Comment: quit Oct 2019   Drug use: Not Currently    Types: Cocaine, IV, Heroin, MDMA (Ecstacy)    Comment: last reported use; cocaine and heroin in 2019; MDMA Feb 2023   Sexual activity: Not Currently  Other Topics Concern   Not on file  Social History Narrative   Not on file   Social Determinants of Health   Financial Resource Strain: Not on file  Food Insecurity: Not on file  Transportation Needs: Not on file  Physical Activity: Not on file  Stress: Not on file  Social Connections: Not on file   Additional Social History:   Sleep: Good  Appetite:  Good  Current Medications: Current Facility-Administered Medications  Medication Dose Route Frequency Provider Last Rate Last Admin   acetaminophen (TYLENOL) tablet 500 mg  500 mg Oral BID PRN Merlyn Lot E, NP   500 mg at 10/04/21 0826   amantadine (SYMMETREL) capsule 100 mg  100 mg Oral BID Lindell Spar I, NP   100 mg at 10/11/21 6734   haloperidol (HALDOL) tablet 5 mg  5 mg Oral Q6H PRN Nicholes Rough, NP       And   benztropine (COGENTIN) tablet 1 mg  1 mg Oral Q6H PRN Nkwenti, Doris, NP       buprenorphine (SUBUTEX) sublingual tablet 8 mg  8 mg Sublingual TID Merlyn Lot E, NP   8 mg at 10/11/21 0803   gabapentin (NEURONTIN) capsule 400 mg  400 mg Oral BID Nicholes Rough, NP   400 mg at 10/11/21 0803   haloperidol (HALDOL) tablet 10 mg  10  mg Oral QHS Nkwenti, Doris, NP   10 mg at 10/10/21 2143   haloperidol (HALDOL) tablet 10 mg  10 mg Oral Daily Lindell Spar I, NP   10 mg at 10/11/21 0803   nicotine polacrilex (NICORETTE) gum 2 mg  2 mg Oral PRN Merlyn Lot E, NP   2 mg at 10/11/21 0804   polyethylene glycol (MIRALAX / GLYCOLAX) packet 17 g  17 g Oral Daily Merlyn Lot E, NP   17 g at 10/10/21 0800   propranolol (INDERAL) tablet 10 mg  10 mg Oral TID Lindell Spar I, NP   10 mg at 10/10/21 2143  tamsulosin (FLOMAX) capsule 0.8 mg  0.8 mg Oral QPC supper Lindell Spar I, NP   0.8 mg at 10/10/21 1636   thyroid (ARMOUR) tablet 90 mg  90 mg Oral Daily Merlyn Lot E, NP   90 mg at 10/11/21 0803   traZODone (DESYREL) tablet 100 mg  100 mg Oral QHS Nicholes Rough, NP   100 mg at 10/10/21 2143   Lab Results:  No results found for this or any previous visit (from the past 48 hour(s)).  Blood Alcohol level:  Lab Results  Component Value Date   ETH <10 09/27/2021   ETH 41 (H) 34/19/3790   Metabolic Disorder Labs: Lab Results  Component Value Date   HGBA1C 5.0 10/03/2021   MPG 96.8 10/03/2021   No results found for: "PROLACTIN" Lab Results  Component Value Date   CHOL 191 10/03/2021   TRIG 92 10/03/2021   HDL 48 10/03/2021   CHOLHDL 4.0 10/03/2021   VLDL 18 10/03/2021   LDLCALC 125 (H) 10/03/2021   Physical Findings: AIMS: Facial and Oral Movements Muscles of Facial Expression: None, normal Lips and Perioral Area: None, normal Jaw: None, normal Tongue: None, normal,Extremity Movements Upper (arms, wrists, hands, fingers): None, normal Lower (legs, knees, ankles, toes): None, normal, Trunk Movements Neck, shoulders, hips: None, normal, Overall Severity Severity of abnormal movements (highest score from questions above): None, normal Incapacitation due to abnormal movements: None, normal Patient's awareness of abnormal movements (rate only patient's report): No Awareness, Dental Status Current problems with teeth  and/or dentures?: No Does patient usually wear dentures?: No  CIWA:    COWS:     Musculoskeletal: Strength & Muscle Tone: within normal limits Gait & Station: normal Patient leans: N/A  Psychiatric Specialty Exam:  Presentation  General Appearance: Appropriate for Environment; Fairly Groomed  Eye Contact:Good  Speech:Clear and Coherent  Speech Volume:Normal  Handedness:Right  Mood and Affect  Mood:Euthymic  Affect:Appropriate  Thought Process  Thought Processes:Coherent  Descriptions of Associations:Intact  Orientation:Full (Time, Place and Person)  Thought Content:Illogical  History of Schizophrenia/Schizoaffective disorder:Yes  Duration of Psychotic Symptoms:Greater than six months  Hallucinations:No data recorded  Ideas of Reference:Paranoia; Delusions  Suicidal Thoughts:No data recorded  Homicidal Thoughts:No data recorded  Sensorium  Memory:Immediate Good  Judgment:Fair  Insight:Fair  Executive Functions  Concentration:Good  Attention Span:Good  [place]  Language:Good  Psychomotor Activity  Psychomotor Activity:No data recorded  Assets  Assets:Communication Skills  Sleep  Sleep:No data recorded  Physical Exam: Physical Exam Vitals and nursing note reviewed.  HENT:     Nose: Nose normal.     Mouth/Throat:     Pharynx: Oropharynx is clear.  Cardiovascular:     Rate and Rhythm: Normal rate.     Pulses: Normal pulses.  Pulmonary:     Effort: Pulmonary effort is normal.  Musculoskeletal:        General: Normal range of motion.     Cervical back: Normal range of motion.  Skin:    General: Skin is warm.  Neurological:     General: No focal deficit present.     Mental Status: He is alert and oriented to person, place, and time.   Review of Systems  Constitutional:  Negative for chills, diaphoresis and fever.  HENT:  Negative for congestion and sore throat.   Respiratory:  Negative for  shortness of breath and wheezing.   Cardiovascular:  Negative for chest pain and palpitations.  Gastrointestinal:  Negative for abdominal pain, constipation, diarrhea, heartburn, nausea  and vomiting.  Neurological:  Negative for dizziness, tingling, tremors, sensory change, speech change, focal weakness, seizures, loss of consciousness, weakness and headaches.  Psychiatric/Behavioral:  Positive for hallucinations and substance abuse. Negative for depression, memory loss and suicidal ideas. The patient is nervous/anxious and has insomnia (improving with medications).    Blood pressure 94/63, pulse (!) 58, temperature 97.7 F (36.5 C), temperature source Oral, resp. rate 20, height '5\' 10"'$  (1.778 m), weight 88.5 kg, SpO2 97 %. Body mass index is 27.99 kg/m.  Treatment Plan Summary: Daily contact with patient to assess and evaluate symptoms and progress in treatment and Medication management.   Continue inpatient hospitalization.  Will continue today 10/11/2021 plan as below except where it is noted.   PLAN Safety and Monitoring: Voluntary admission to inpatient psychiatric unit for safety, stabilization and treatment Daily contact with patient to assess and evaluate symptoms and progress in treatment Patient's case to be discussed in multi-disciplinary team meeting Observation Level : q15 minute checks Vital signs: q12 hours Precautions: Safety                           Diagnoses Principal Problem: Undifferentiated schizophrenia ([name]) Diagnosis: Principal Problem:   Undifferentiated schizophrenia ([place]) Active Problems:   Paranoid schizophrenia ([place])   Opioid use disorder   Stimulant use disorder   Nicotine dependence   GAD (generalized anxiety disorder)  Medications - Continue haldol 10 mg po  in am for psychosis  - Continue Haldol 10 mg Q HS for psychosis - Discontinued Cogentin 0.5 mg BID for EPS due to Hx. BPH. - Continue Amantadine 100 mg po bid for eps. - Continue gabapentin  400 mg po bid for anxiety/restless legs. - Continue Trazodone '100mg'$  po Q hs routinely for insomnia.   - Continue Propranolol 10 mg po tid for anxiety. - Continue Nicotine 21 mg trans-dermally Q 24 hrs for nicotine withdrawal. - Continue Nicorette gum 2 mg po prn for nicotine withdrawal prn - Continue Agitation Protocol PRN Q6 H (Haldol and Cogentin PRN as per [facility])   Other medical issues. - Continue Flomax 0.8 mg po Q hs for prostate health.  - Continue Thyroid ([place]) 90 mg po daily for hypothyroidism.  - Continue Miralax 17 gm po daily for constipation.   Other prn medications.  - acetaminophen 500 mg po bid prn for pain/fever. - Mylanta 30 ml po Q 6 hours prn for indigestion.  - MOM 30 ml po q daily prn for constipation.   Discharge Planning: Social work and case management to assist with discharge planning and identification of hospital follow-up needs prior to discharge Estimated LOS: 5-7 days Discharge Concerns: Need to establish a safety plan; Medication compliance and effectiveness Discharge Goals: Return home with outpatient referrals for mental health follow-up including medication management/psychotherapy  Observation Level/Precautions:  15 minute checks  Laboratory:  Labs reviewed   Psychotherapy:  Unit Group sessions  Medications:  See [facility]  Consultations:  To be determined   Discharge Concerns:  Safety, medication compliance, mood stability  Estimated LOS: 5-7 days  Other:  N/A   Lindell Spar, NP, pmhnp, fnp-bc 10/11/2021, 8:48 AMPatient ID: Paul Bray, male   DOB: 20-Dec-1965, 56 y.o.   MRN: [id number] Patient ID: Paul Bray, male   DOB: [birth year], 56 y.o.   MRN: [id number] Patient ID: Paul Bray, male   DOB: 17-Feb-1966, 56 y.o.   MRN: [id number] Patient ID: Paul Bray, male   DOB: May 15, 1965, 56 y.o.  MRN: [id number]

## 2021-10-11 NOTE — Group Note (Deleted)
LCSW Group Therapy Note   Group Date: 10/11/2021 Start Time: 1000 End Time: 1100   Type of Therapy and Topic:  Group Therapy:   Participation Level:  {BHH PARTICIPATION JQGBE:01007}  Description of Group:   Therapeutic Goals:  1.     Summary of Patient Progress:    ***  Therapeutic Modalities:   Marquette Old 10/11/2021  9:41 AM

## 2021-10-12 NOTE — Progress Notes (Signed)
Nurse discussed coping skills with patient.  

## 2021-10-12 NOTE — Progress Notes (Signed)
[facility] MD Progress Note  10/12/2021 11:42 AM Paul Bray  MRN:  [id number]  Subjective: Paul Bray reports, "I feel I'm getting ready for discharge. I'm doing well on the medicines. It feels great to not have to deal with the voices any more".  Reason for admission: 56 y.o. Caucasian  male patient admitted with hx significant for schizophrenia, paranoid personality, delusional disorder, severe opioid dependence, alcohol use disorder, bipolar disorder, substance induced mood disorder.  Pt presented to the [facility] ED with complaints of worsening auditory & visual hallucinations, paranoia, and +SI, with a plan to "overmedicate" himself to death. He was on MAT (Suboxone) for Opioid abuse, & reported withdrawal symptoms due to noncompliance. Pt was involuntarily transferred to this [facility] for treatment and stabilization of his mental health symptoms.  Today's patient assessment Note: Paul Bray is seen, chart reviewed. The chart findings discussed with the treatment team. Pt is seen in his room on the 400 hall. He was lying down in his bed, but able to sit up on the side of the bed during this follow-up care evaluation. Paul Bray presents today with a bright/cheerful affect & optimistic about maintaining his current mood as he feels this is the best he has felt in a long time. He maintained a good eye contact throughout this assessment. As I had seen & evaluated this patient several times prior to this evaluation today, he seems to be doing better on daily basis. His outlook & mannerism has improved remarkably. He believes he is doing much better as well. He is currently talking about/anticipating discharge. He reports that the voices has improved remarkably that he has not heard them now in two days.  He is encouraged to come out of his room, try to attend group sessions now that the voices are not as bothersome. He is encouraged even after discharge to try some daily activities like brisk walk in the  morning/evenings when the weather is not so hot to get some fresh air as this might create some distraction from him focusing too much on his mental illness. Patient is in agreement & thinks this idea may benefit him moving forward. He currently denies any SIHI, AVH, delusional thoughts or paranoia. He does not appear to be responding to any internal stimuli. We will continue current plan of care as already in progress. Reviewed current labe results, no changes. Vital signs remain stable. Planned discharge on Monday as he continues to improve on daily basis. Patient says his outpatient provider for his Subutex is Dr. Deatra Ina at the [facility].  Principal Problem: Undifferentiated schizophrenia ([place])  Diagnosis: Principal Problem:   Undifferentiated schizophrenia ([place]) Active Problems:   Paranoid schizophrenia ([place])   Insomnia   Opioid use disorder   Stimulant use disorder   Nicotine dependence   GAD (generalized anxiety disorder)  Total Time spent with patient:  35 minutes  Past Psychiatric History: See H&P  Past Medical History:  Past Medical History:  Diagnosis Date   ADHD    Anxiety    Brain tumor ([place])    balance and occ memory issues and headaches   Brain tumor ([place])    sees [facility] q year   Depression    Headache    migraine and tension   Hypothyroidism    Soft tissue mass    left shoulder   Substance abuse ([place])     Past Surgical History:  Procedure Laterality Date   colonscopy     gamma knife radiation 2018 for  brain tumor'     hematoma removed from arm Left    MASS EXCISION Left 06/30/2019   Procedure: EXCISION OF SOFT TISSUE  MASS LEFT SHOULDER;  Surgeon: Armandina Gemma, MD;  Location: [place];  Service: General;  Laterality: Left;  LMA   Family History: History reviewed. No pertinent family history.  Family Psychiatric  History: See H&P  Social History:  Social History   Substance and Sexual Activity  Alcohol Use Not Currently    Comment: quit Oct 2019     Social History   Substance and Sexual Activity  Drug Use Not Currently   Types: Cocaine, IV, Heroin, MDMA (Ecstacy)   Comment: last reported use; cocaine and heroin in 2019; MDMA Feb 2023    Social History   Socioeconomic History   Marital status: Single    Spouse name: Not on file   Number of children: Not on file   Years of education: Not on file   Highest education level: Not on file  Occupational History   Not on file  Tobacco Use   Smoking status: Former    Types: Cigarettes   Smokeless tobacco: Current   Tobacco comments:    quit jan 2020  Vaping Use   Vaping Use: Every day  Substance and Sexual Activity   Alcohol use: Not Currently    Comment: quit Oct 2019   Drug use: Not Currently    Types: Cocaine, IV, Heroin, MDMA (Ecstacy)    Comment: last reported use; cocaine and heroin in 2019; MDMA Feb 2023   Sexual activity: Not Currently  Other Topics Concern   Not on file  Social History Narrative   Not on file   Social Determinants of Health   Financial Resource Strain: Not on file  Food Insecurity: Not on file  Transportation Needs: Not on file  Physical Activity: Not on file  Stress: Not on file  Social Connections: Not on file   Additional Social History:   Sleep: Good  Appetite:  Good  Current Medications: Current Facility-Administered Medications  Medication Dose Route Frequency Provider Last Rate Last Admin   acetaminophen (TYLENOL) tablet 500 mg  500 mg Oral BID PRN Merlyn Lot E, NP   500 mg at 10/04/21 0826   amantadine (SYMMETREL) capsule 100 mg  100 mg Oral BID Lindell Spar I, NP   100 mg at 10/12/21 0751   haloperidol (HALDOL) tablet 5 mg  5 mg Oral Q6H PRN Nicholes Rough, NP       And   benztropine (COGENTIN) tablet 1 mg  1 mg Oral Q6H PRN Nkwenti, Doris, NP       buprenorphine (SUBUTEX) sublingual tablet 8 mg  8 mg Sublingual TID Merlyn Lot E, NP   8 mg at 10/12/21 0756   gabapentin (NEURONTIN) capsule 400  mg  400 mg Oral BID Nicholes Rough, NP   400 mg at 10/12/21 0751   haloperidol (HALDOL) tablet 10 mg  10 mg Oral QHS Nkwenti, Doris, NP   10 mg at 10/11/21 2108   haloperidol (HALDOL) tablet 10 mg  10 mg Oral Daily Lindell Spar I, NP   10 mg at 10/12/21 0753   nicotine polacrilex (NICORETTE) gum 2 mg  2 mg Oral PRN Merlyn Lot E, NP   2 mg at 10/12/21 1007   polyethylene glycol (MIRALAX / GLYCOLAX) packet 17 g  17 g Oral Daily Merlyn Lot E, NP   17 g at 10/12/21 0751   propranolol (INDERAL) tablet  10 mg  10 mg Oral TID Lindell Spar I, NP   10 mg at 10/10/21 2143   tamsulosin (FLOMAX) capsule 0.8 mg  0.8 mg Oral QPC supper Lindell Spar I, NP   0.8 mg at 10/11/21 1729   thyroid (ARMOUR) tablet 90 mg  90 mg Oral Daily Merlyn Lot E, NP   90 mg at 10/12/21 0754   traZODone (DESYREL) tablet 100 mg  100 mg Oral QHS Nicholes Rough, NP   100 mg at 10/11/21 2108   Lab Results:  No results found for this or any previous visit (from the past 48 hour(s)).  Blood Alcohol level:  Lab Results  Component Value Date   ETH <10 09/27/2021   ETH 41 (H) 71/69/6789   Metabolic Disorder Labs: Lab Results  Component Value Date   HGBA1C 5.0 10/03/2021   MPG 96.8 10/03/2021   No results found for: "PROLACTIN" Lab Results  Component Value Date   CHOL 191 10/03/2021   TRIG 92 10/03/2021   HDL 48 10/03/2021   CHOLHDL 4.0 10/03/2021   VLDL 18 10/03/2021   LDLCALC 125 (H) 10/03/2021   Physical Findings: AIMS: Facial and Oral Movements Muscles of Facial Expression: None, normal Lips and Perioral Area: None, normal Jaw: None, normal Tongue: None, normal,Extremity Movements Upper (arms, wrists, hands, fingers): None, normal Lower (legs, knees, ankles, toes): None, normal, Trunk Movements Neck, shoulders, hips: None, normal, Overall Severity Severity of abnormal movements (highest score from questions above): None, normal Incapacitation due to abnormal movements: None, normal Patient's awareness of  abnormal movements (rate only patient's report): No Awareness, Dental Status Current problems with teeth and/or dentures?: No Does patient usually wear dentures?: No  CIWA:    COWS:     Musculoskeletal: Strength & Muscle Tone: within normal limits Gait & Station: normal Patient leans: N/A  Psychiatric Specialty Exam:  Presentation  General Appearance: Appropriate for Environment; Casual; Fairly Groomed  Eye Contact:Good  Speech:Clear and Coherent; Normal Rate  Speech Volume:Normal  Handedness:Right  Mood and Affect  Mood:Euthymic  Affect:Congruent; Appropriate  Thought Process  Thought Processes:Linear; Coherent; Goal Directed  Descriptions of Associations:Intact  Orientation:Full (Time, Place and Person)  Thought Content:Logical  History of Schizophrenia/Schizoaffective disorder:Yes  Duration of Psychotic Symptoms:Greater than six months  Hallucinations:Hallucinations: None Description of Command Hallucinations: NA Description of Auditory Hallucinations: NA   Ideas of Reference:None  Suicidal Thoughts:No data recorded  Homicidal Thoughts:No data recorded  Sensorium  Memory:Immediate Good; Recent Good; Remote Good  Judgment:Good  Insight:Good  Executive Functions  Concentration:Good  Attention Span:Good  [place] of Knowledge:Good  Language:Good  Psychomotor Activity  Psychomotor Activity:Psychomotor Activity: Normal   Assets  Assets:Communication Skills; Desire for Improvement; Financial Resources/Insurance; Housing; Social Support  Sleep  Sleep:Sleep: Good Number of Hours of Sleep: 8.25   Physical Exam: Physical Exam Vitals and nursing note reviewed.  HENT:     Nose: Nose normal.     Mouth/Throat:     Pharynx: Oropharynx is clear.  Cardiovascular:     Rate and Rhythm: Normal rate.     Pulses: Normal pulses.  Pulmonary:     Effort: Pulmonary effort is normal.  Genitourinary:    Comments: Hx.  BPH Musculoskeletal:        General: Normal range of motion.     Cervical back: Normal range of motion.  Skin:    General: Skin is warm.  Neurological:     General: No focal deficit present.     Mental Status: He is alert  and oriented to person, place, and time. Mental status is at baseline.   Review of Systems  Constitutional:  Negative for chills, diaphoresis and fever.  HENT:  Negative for congestion and sore throat.   Respiratory:  Negative for shortness of breath and wheezing.   Cardiovascular:  Negative for chest pain and palpitations.  Gastrointestinal:  Negative for abdominal pain, constipation, diarrhea, heartburn, nausea and vomiting.  Neurological:  Negative for dizziness, tingling, tremors, sensory change, speech change, focal weakness, seizures, loss of consciousness, weakness and headaches.  Psychiatric/Behavioral:  Positive for hallucinations and substance abuse. Negative for depression, memory loss and suicidal ideas. The patient is nervous/anxious and has insomnia (improving with medications).    Blood pressure (!) 85/60, pulse 82, temperature 97.7 F (36.5 C), temperature source Oral, resp. rate 15, height '5\' 10"'$  (1.778 m), weight 88.5 kg, SpO2 98 %. Body mass index is 27.99 kg/m.  Treatment Plan Summary: Daily contact with patient to assess and evaluate symptoms and progress in treatment and Medication management.   Continue inpatient hospitalization.  Will continue today 10/12/2021 plan as below except where it is noted.   PLAN Safety and Monitoring: Voluntary admission to inpatient psychiatric unit for safety, stabilization and treatment Daily contact with patient to assess and evaluate symptoms and progress in treatment Patient's case to be discussed in multi-disciplinary team meeting Observation Level : q15 minute checks Vital signs: q12 hours Precautions: Safety  Diagnoses Principal Problem: Undifferentiated schizophrenia ([place]) Diagnosis: Principal  Problem:   Undifferentiated schizophrenia ([place]) Active Problems:   Paranoid schizophrenia ([place])   Opioid use disorder   Stimulant use disorder   Nicotine dependence   GAD (generalized anxiety disorder)  Medications - Continue haldol 10 mg po  in am for psychosis  - Continue Haldol 10 mg Q HS for psychosis - Discontinued Cogentin 0.5 mg BID for EPS due to Hx. BPH. - Continue Amantadine 100 mg po bid for eps. - Continue gabapentin 400 mg po bid for anxiety/restless legs. - Continue Trazodone '100mg'$  po Q hs routinely for insomnia.   - Continue Propranolol 10 mg po tid for anxiety. - Continue Nicotine 21 mg trans-dermally Q 24 hrs for nicotine withdrawal. - Continue Nicorette gum 2 mg po prn for nicotine withdrawal prn - Continue Agitation Protocol PRN Q6 H (Haldol and Cogentin PRN as per [facility])   Other medical issues. - Continue Flomax 0.8 mg po Q hs for prostate health.  - Continue Thyroid (Armour) 90 mg po daily for hypothyroidism.  - Continue Miralax 17 gm po daily for constipation.   Other prn medications.  - acetaminophen 500 mg po bid prn for pain/fever. - Mylanta 30 ml po Q 6 hours prn for indigestion.  - MOM 30 ml po q daily prn for constipation.   Discharge Planning: Social work and case management to assist with discharge planning and identification of hospital follow-up needs prior to discharge Estimated LOS: 5-7 days Discharge Concerns: Need to establish a safety plan; Medication compliance and effectiveness Discharge Goals: Return home with outpatient referrals for mental health follow-up including medication management/psychotherapy  Lindell Spar, NP, pmhnp, fnp-bc 10/12/2021, 11:42 AMPatient ID: Paul Bray, male   DOB: [birth year], 56 y.o.   MRN: [id number] Patient ID: Paul Bray, male   DOB: 14-Jul-1965, 56 y.o.   MRN: [id number] Patient ID: Paul Bray, male   DOB: [birth year], 56 y.o.   MRN: [id number] Patient ID: Paul Bray, male   DOB: 1965-10-07, 56 y.o.    MRN: [id number] Patient ID: Paul Heckendorn  Bray, male   DOB: [birth year], 56 y.o.   MRN: [id number]

## 2021-10-12 NOTE — B[facility] Group Notes (Signed)
.  Psychoeducational Group Note    Date:  6/17//23 Time: 1300-1400    Purpose of Group: . The group focus' on teaching patients on how to identify their needs and their Life Skills:  A group where two lists are made. What people need and what are things that we do that are unhealthy. The lists are developed by the patients and it is explained that we often do the actions that are not healthy to get our list of needs met.  Goal:: to develop the coping skills needed to get their needs met  Participation Level:  Active  Participation Quality:  Appropriate  Affect:  Appropriate  Cognitive:  Oriented  Insight:  Improving  Engagement in Group:  Engaged  Additional Comments: Rates his energy at a 5/10. Participated in the group.  Paulino Rily

## 2021-10-12 NOTE — Progress Notes (Signed)
[place] Group Notes:  (Nursing/MHT/Case Management/Adjunct)  Date:  10/12/2021  Time:  2015  Type of Therapy:   wrap up group  Participation Level:  Active  Participation Quality:  {BHH PARTICIPATION QUALITY:22265}  Affect:  {BHH AFFECT:22266}  Cognitive:  {BHH COGNITIVE:22267}  Insight:  {BHH Insight2:20797}  Engagement in Group:  {BHH ENGAGEMENT IN PFXTK:24097}  Modes of Intervention:  {BHH MODES OF INTERVENTION:22269}  Summary of Progress/Problems:  Paul Bray 10/12/2021, 9:33 PM

## 2021-10-12 NOTE — Group Note (Signed)
[facility] LCSW Group Therapy Note  10/12/2021  10:00-11:00AM  Type of Therapy and Topic:  Group Therapy:  Acknowledging and Resolving Issues with Fathers  Participation Level:  None   Description of Group:   Patients in this group were asked to briefly describe their experience with the father figure(s) in their lives, both in childhood and adulthood.  Different types of support provided by these individuals were identified.   Patients were then encouraged to determine whether their father figure was or is a healthy or unhealthy support.  The manner in which that early relationship has shaped patient's feelings and life decisions was pointed out and acknowledged.  Group members gave support to each other.  CSW led a discussion on how helpful it can be to resolve past issues, and how this can be done whether the father figure is now alive or already deceased.  An emphasis was placed on continuing to work with a therapist on these issues  when patients leave the hospital in order to be able to focus on the future instead of the past, to continue becoming healthier and happier.   Therapeutic Goals: 1)  discuss the possibility of father figure(s) being positive and/or negative in one's life, normalizing that some people never had positive experiences with "paternal" persons  2)  describe patient's specific example of father figure(s), allowing time to vent  3)  identify the patient's current need for resolution in the relationship with the aforementioned person  4)  elicit commitments to work on resolving feelings about father figure(s) in order to move forward in life and wellness   Summary of Patient Progress:  The patient only stayed a few minutes in group, then left and did not return.   Therapeutic Modalities:   Processing Brief Solution-Focused Therapy  Maretta Los

## 2021-10-12 NOTE — Progress Notes (Signed)
D:  Patient denied SI and HI, contracts for safety.  Denied A/V hallucinations.   A:  Medications administered per MD orders.  Emotional support and encouragement given patient. R:  Safety maintained with 15 minute checks.  

## 2021-10-12 NOTE — Progress Notes (Signed)
   10/12/21 2151  Psych Admission Type (Psych Patients Only)  Admission Status Voluntary  Psychosocial Assessment  Patient Complaints Anxiety  Eye Contact Fair  Facial Expression Anxious  Affect Appropriate to circumstance  Speech Logical/coherent  Interaction Assertive  Motor Activity Other (Comment) (WDL)  Appearance/Hygiene Unremarkable  Behavior Characteristics Appropriate to situation  Mood Anxious;Pleasant  Thought Process  Coherency WDL  Content WDL  Delusions None reported or observed  Perception WDL  Hallucination None reported or observed  Judgment Limited  Confusion None  Danger to Self  Current suicidal ideation? Denies  Danger to Others  Danger to Others None reported or observed

## 2021-10-12 NOTE — [facility] Group Notes (Signed)
Adult Psychoeducational Group Not Date:  10/12/2021 Time:  0900-1045 Group Topic/Focus: PROGRESSIVE RELAXATION. A group where deep breathing is taught and tensing and relaxation muscle groups is used. Imagery is used as well.  Pts are asked to imagine 3 pillars that hold them up when they are not able to hold themselves up and to share that with the group.  Participation Level: did not attend  Paulino Rily

## 2021-10-12 NOTE — Progress Notes (Addendum)
Pt propanolol was not given yesterday however pt's BP was low this AM. Pt denied any dizziness, weakness, fatigue, or blurred vision. Pt stated he feels "funny/groggy." Provider made aware. Pt given fluids to hydrate and re educated on fall prevention.  Vitals to be retaken after interventions.     10/12/21 0604 10/12/21 0606  Vital Signs  Temp 97.7 F (36.5 C)  --   Temp Source Oral  --   Pulse Rate 71 82  Pulse Rate Source Monitor  --   Resp 15  --   BP (!) 89/65 (!) 85/60  BP Location Left Arm Left Arm  BP Method Automatic Automatic  Patient Position (if appropriate) Sitting Standing  Oxygen Therapy  SpO2 98 %  --

## 2021-10-13 ENCOUNTER — Encounter (HOSPITAL_COMMUNITY): Payer: Self-pay

## 2021-10-13 MED ORDER — PROPRANOLOL HCL 10 MG PO TABS: 10.0000 mg | ORAL_TABLET | Freq: Three times a day (TID) | ORAL | 0 refills | Status: DC

## 2021-10-13 MED ORDER — GABAPENTIN 400 MG PO CAPS: 400.0000 mg | ORAL_CAPSULE | Freq: Two times a day (BID) | ORAL | 0 refills | Status: DC

## 2021-10-13 MED ORDER — TAMSULOSIN HCL 0.4 MG PO CAPS
0.8000 mg | ORAL_CAPSULE | Freq: Every day | ORAL | 0 refills | Status: DC
Start: 2021-10-13 — End: 2022-02-26

## 2021-10-13 MED ORDER — AMANTADINE HCL 100 MG PO CAPS
100.0000 mg | ORAL_CAPSULE | Freq: Two times a day (BID) | ORAL | 0 refills | Status: DC
Start: 2021-10-13 — End: 2021-10-16

## 2021-10-13 MED ORDER — NICOTINE 21 MG/24HR TD PT24: 21.0000 mg | MEDICATED_PATCH | Freq: Every day | TRANSDERMAL | 0 refills | Status: AC

## 2021-10-13 MED ORDER — THYROID 90 MG PO TABS: 90.0000 mg | ORAL_TABLET | Freq: Every day | ORAL | 0 refills | Status: DC

## 2021-10-13 MED ORDER — TRAZODONE HCL 100 MG PO TABS
100.0000 mg | ORAL_TABLET | Freq: Every day | ORAL | 0 refills | Status: DC
Start: 2021-10-13 — End: 2021-11-20

## 2021-10-13 MED ORDER — HALOPERIDOL 10 MG PO TABS
10.0000 mg | ORAL_TABLET | Freq: Two times a day (BID) | ORAL | 0 refills | Status: DC
Start: 2021-10-13 — End: 2021-10-23

## 2021-10-13 MED ORDER — POLYETHYLENE GLYCOL 3350 17 G PO PACK
17.0000 g | PACK | Freq: Every day | ORAL | 0 refills | Status: DC
Start: 2021-10-14 — End: 2022-05-26

## 2021-10-13 NOTE — Discharge Summary (Signed)
Physician Discharge Summary Note  Patient:  Paul Bray is an 56 y.o., male MRN:  [id number] DOB:  January 12, 1966 Patient phone:  [phone] (home)  Patient address:   [place] [address],  Total Time spent with patient: 30 minutes  Date of Admission:  10/02/2021 Date of Discharge: 10/13/2021  Reason for Admission:  Paul Bray is a 56 y.o. Caucasian  male patient admitted with hx significant for schizophrenia, paranoid personality, delusional disorder, severe opioid dependence, alcohol use disorder, bipolar disorder, substance induced mood disorder.  Pt presented to the [facility] ED with complaints of worsening auditory & visual hallucinations, paranoia, and +SI, with a plan to "overmedicate" himself to death. He was on MAT (Suboxone) for Opioid abuse, & reported withdrawal symptoms due to noncompliance. Pt was involuntarily transferred to this [facility] for treatment and stabilization of his mental health symptoms.  Principal Problem: Undifferentiated schizophrenia [facility]) Discharge Diagnoses: Principal Problem:   Undifferentiated schizophrenia ([place]) Active Problems:   Paranoid schizophrenia ([place])   Insomnia   Opioid use disorder   Stimulant use disorder   Nicotine dependence   GAD (generalized anxiety disorder)  Past Psychiatric History: As above  Past Medical History:  Past Medical History:  Diagnosis Date   ADHD    Anxiety    Brain tumor ([place])    balance and occ memory issues and headaches   Brain tumor ([place])    sees [facility] q year   Depression    Headache    migraine and tension   Hypothyroidism    Soft tissue mass    left shoulder   Substance abuse ([place])     Past Surgical History:  Procedure Laterality Date   colonscopy     gamma knife radiation 2018 for brain tumor'     hematoma removed from arm Left    MASS EXCISION Left 06/30/2019   Procedure: EXCISION OF SOFT TISSUE  MASS LEFT SHOULDER;  Surgeon: Armandina Gemma, MD;   Location: [place];  Service: General;  Laterality: Left;  LMA   Family History: History reviewed. No pertinent family history. Family Psychiatric  History: none reported Social History:  Social History   Substance and Sexual Activity  Alcohol Use Not Currently   Comment: quit Oct 2019     Social History   Substance and Sexual Activity  Drug Use Not Currently   Types: Cocaine, IV, Heroin, MDMA (Ecstacy)   Comment: last reported use; cocaine and heroin in 2019; MDMA Feb 2023    Social History   Socioeconomic History   Marital status: Single    Spouse name: Not on file   Number of children: Not on file   Years of education: Not on file   Highest education level: Not on file  Occupational History   Not on file  Tobacco Use   Smoking status: Former    Types: Cigarettes   Smokeless tobacco: Current   Tobacco comments:    quit jan 2020  Vaping Use   Vaping Use: Every day  Substance and Sexual Activity   Alcohol use: Not Currently    Comment: quit Oct 2019   Drug use: Not Currently    Types: Cocaine, IV, Heroin, MDMA (Ecstacy)    Comment: last reported use; cocaine and heroin in 2019; MDMA Feb 2023   Sexual activity: Not Currently  Other Topics Concern   Not on file  Social History Narrative   Not on file   Social Determinants  of Health   Financial Resource Strain: Not on file  Food Insecurity: Not on file  Transportation Needs: Not on file  Physical Activity: Not on file  Stress: Not on file  Social Connections: Not on file                                            HOSPITAL COURSE During the patient's hospitalization, patient had extensive initial psychiatric evaluation, and follow-up psychiatric evaluations every day.   Psychiatric diagnoses provided upon initial assessment were as follows: Principal Problem:   Undifferentiated schizophrenia ([place]) Active Problems:   Paranoid schizophrenia ([place])   Insomnia   Opioid use disorder    Stimulant use disorder   Nicotine dependence   GAD (generalized anxiety disorder)   Patient's medications were adjusted on admission as follows: - Initiated Olanzapine 2.5 mg po daily for psychosis. - Olanzapine 5 mg po Q hs x 1 for psychosis.  - Olanzapine 7.5 mg po Q bedtime (start 10-03-21). - Resumed gabapentin 300 mg po bid for anxiety. - Continue Hydroxyzine 25 mg po tid prn for anxiety.  - Continue Trazodone 50 mg po Q hs prn for insomnia.  - Resumed gabapentin 300 mg po bid for anxiety.  - Nicotine 21 mg trans-dermally Q 24 hrs for nicotine withdrawal. - Nicorette gum 2 mg po prn for nicotine withdrawal prn.  - Resumed on Flomax  .04 mg po Q hs for prostate health. - Resumed Thyroid (Armour) 90 mg po daily for hypothyroidism.  - Resumed Miralax 17 gm po daily for constipation. - Resumed Subutex 8 mg SL TID-Pt was educated that he will not be discharged from [facility] with a prescription for this medication. He verbalized understanding and stated that he would return to his outpatient clinic for this medication after discharge.   During the hospitalization, other adjustments were made to the patient's psychiatric medication regimen. Pt's psychosis did not respond to Zyprexa, and it was switched to Haldol. Cogentin was discontinued due to h/o BPH and Amantadine was started for EPS. Medications at discharge are as follows: - Continue haldol 10 mg po BID for psychosis  - Discontinued Cogentin 0.5 mg BID for EPS due to Hx. BPH. - Continue Amantadine 100 mg po bid for eps. - Continue gabapentin 400 mg po bid for anxiety/restless legs. - Continue Trazodone '100mg'$  po Q hs routinely for insomnia.   - Continue Propranolol 10 mg po tid for anxiety. - Continue Nicotine 21 mg trans-dermally Q 24 hrs for nicotine withdrawal.  - Continue Flomax 0.8 mg po Q hs for prostate health.  - Continue Thyroid (Armour) 90 mg po daily for hypothyroidism.  - Continue Miralax 17 gm po daily for constipation.    Patient's care was discussed during the interdisciplinary team meeting every day during the hospitalization.  The patient denies having side effects to prescribed psychiatric medication. The patient was evaluated each day by a clinical provider to ascertain response to treatment. Improvement was noted by the patient's report of decreasing symptoms, improved sleep and appetite, affect, medication tolerance, behavior, and participation in unit programming.  Patient was asked each day to complete a self inventory noting mood, mental status, pain, new symptoms, anxiety and concerns.     Symptoms were reported as significantly decreased or resolved completely by discharge. On day of discharge, the patient reports that their mood is stable. The patient denied having  suicidal thoughts for more than 48 hours prior to discharge.  Patient denies having homicidal thoughts.  Patient denies having auditory hallucinations.  Patient denies any visual hallucinations or other symptoms of psychosis. The patient was motivated to continue taking medication with a goal of continued improvement in mental health.    The patient reports their target psychiatric symptoms of psychosis, anxiety, depression & insomnia responded well to the psychiatric medications, and the patient reports overall benefit other psychiatric hospitalization. Supportive psychotherapy was provided to the patient. The patient also participated in regular group therapy while hospitalized. Coping skills, problem solving as well as relaxation therapies were also part of the unit programming.   Labs were reviewed with the patient, and abnormal results were discussed with the patient.   Total Time spent with patient: 30 minutes   Physical Findings: AIMS: Facial and Oral Movements Muscles of Facial Expression: None, normal Lips and Perioral Area: None, normal Jaw: None, normal Tongue: None, normal,Extremity Movements Upper (arms, wrists, hands, fingers):  None, normal Lower (legs, knees, ankles, toes): None, normal, Trunk Movements Neck, shoulders, hips: None, normal, Overall Severity Severity of abnormal movements (highest score from questions above): None, normal Incapacitation due to abnormal movements: None, normal Patient's awareness of abnormal movements (rate only patient's report): No Awareness, Dental Status Current problems with teeth and/or dentures?: No Does patient usually wear dentures?: No  CIWA:  n/a  COWS:   n/a  Musculoskeletal: Strength & Muscle Tone: within normal limits Gait & Station: normal Patient leans: N/A  Psychiatric Specialty Exam:  Presentation  General Appearance: Appropriate for Environment; Fairly Groomed  Eye Contact:Good  Speech:Clear and Coherent  Speech Volume:Normal  Handedness:Right  Mood and Affect  Mood:Euthymic  Affect:Congruent  Thought Process  Thought Processes:Coherent  Descriptions of Associations:Intact  Orientation:Full (Time, Place and Person)  Thought Content:Logical  History of Schizophrenia/Schizoaffective disorder:Yes  Duration of Psychotic Symptoms:Greater than six months  Hallucinations:Hallucinations: None Description of Command Hallucinations: NA Description of Auditory Hallucinations: NA  Ideas of Reference:None  Suicidal Thoughts:Suicidal Thoughts: No  Homicidal Thoughts:Homicidal Thoughts: No  Sensorium  Memory:Immediate Good  Judgment:Good  Insight:Good  Executive Functions  Concentration:Good  Attention Span:Good  [place] of Knowledge:Good  Language:Good  Psychomotor Activity  Psychomotor Activity:Psychomotor Activity: Normal  Assets  Assets:Communication Skills  Sleep  Sleep:Sleep: Good Number of Hours of Sleep: 8.25  Physical Exam: Physical Exam Constitutional:      Appearance: Normal appearance.  HENT:     Head: Normocephalic.     Nose: Nose normal. No congestion or rhinorrhea.  Eyes:     Pupils:  Pupils are equal, round, and reactive to light.  Pulmonary:     Effort: Pulmonary effort is normal.  Musculoskeletal:        General: Normal range of motion.     Cervical back: Normal range of motion.  Neurological:     Mental Status: He is alert and oriented to person, place, and time.     Sensory: No sensory deficit.     Coordination: Coordination normal.  Psychiatric:        Behavior: Behavior normal.        Thought Content: Thought content normal.    Review of Systems  Constitutional: Negative.   HENT: Negative.    Eyes: Negative.   Respiratory: Negative.    Cardiovascular: Negative.   Gastrointestinal: Negative.   Genitourinary: Negative.   Musculoskeletal: Negative.   Skin: Negative.   Neurological: Negative.   Psychiatric/Behavioral:  Positive for substance abuse (On MAT  for Opoiod use d/o). Negative for hallucinations (Pt's psychosis has resolved on Haldol), memory loss and suicidal ideas. The patient is not nervous/anxious and does not have insomnia.    Blood pressure 115/76, pulse 63, temperature 97.7 F (36.5 C), temperature source Oral, resp. rate 18, height '5\' 10"'$  (1.778 m), weight 88.5 kg, SpO2 98 %. Body mass index is 27.99 kg/m.  Social History   Tobacco Use  Smoking Status Former   Types: Cigarettes  Smokeless Tobacco Current  Tobacco Comments   quit jan 2020   Tobacco Cessation:  A prescription for an FDA-approved tobacco cessation medication provided at discharge  Blood Alcohol level:  Lab Results  Component Value Date   ETH <10 09/27/2021   ETH 41 (H) 47/42/5956   Metabolic Disorder Labs:  Lab Results  Component Value Date   HGBA1C 5.0 10/03/2021   MPG 96.8 10/03/2021   No results found for: "PROLACTIN" Lab Results  Component Value Date   CHOL 191 10/03/2021   TRIG 92 10/03/2021   HDL 48 10/03/2021   CHOLHDL 4.0 10/03/2021   VLDL 18 10/03/2021   LDLCALC 125 (H) 10/03/2021   See Psychiatric Specialty Exam and Suicide Risk Assessment  completed by Attending Physician prior to discharge.  Discharge destination:  Home  Is patient on multiple antipsychotic therapies at discharge:  No   Has Patient had three or more failed trials of antipsychotic monotherapy by history:  No  Recommended Plan for Multiple Antipsychotic Therapies: NA   Allergies as of 10/13/2021       Reactions   Naloxone Palpitations   Pt report MAT Rx is Subutex/Buprenorphine only   Amphetamine-dextroamphetamine Other (See Comments)        Medication List     STOP taking these medications    acetaminophen 500 MG tablet Commonly known as: TYLENOL   ARIPiprazole 20 MG tablet Commonly known as: ABILIFY   Fluocinolone Acetonide 0.01 % Oil   phentermine 37.5 MG capsule       TAKE these medications      Indication  amantadine 100 MG capsule Commonly known as: SYMMETREL Take 1 capsule (100 mg total) by mouth 2 (two) times daily.  Indication: Extrapyramidal Reaction caused by Medications   buprenorphine 8 MG Subl SL tablet Commonly known as: SUBUTEX Place 8 mg under the tongue 3 (three) times daily.  Indication: Opioid Dependence   gabapentin 400 MG capsule Commonly known as: NEURONTIN Take 1 capsule (400 mg total) by mouth 2 (two) times daily. What changed:  medication strength how much to take when to take this reasons to take this  Indication: restless legs   haloperidol 10 MG tablet Commonly known as: HALDOL Take 1 tablet (10 mg total) by mouth 2 (two) times daily.  Indication: Psychosis, psychosis   nicotine 21 mg/24hr patch Commonly known as: NICODERM CQ - dosed in mg/24 hours Place 1 patch (21 mg total) onto the skin daily.  Indication: Nicotine Addiction   polyethylene glycol 17 g packet Commonly known as: MIRALAX / GLYCOLAX Take 17 g by mouth daily. Start taking on: October 14, 2021  Indication: Constipation   propranolol 10 MG tablet Commonly known as: INDERAL Take 1 tablet (10 mg total) by mouth 3 (three)  times daily. What changed:  medication strength how much to take when to take this reasons to take this  Indication: Feeling Anxious, High Blood Pressure Disorder   tamsulosin 0.4 MG Caps capsule Commonly known as: FLOMAX Take 2 capsules (0.8 mg total) by mouth  daily after supper.  Indication: Benign Enlargement of Prostate   thyroid 90 MG tablet Commonly known as: Armour Thyroid Take 1 tablet (90 mg total) by mouth daily. Start taking on: October 14, 2021  Indication: Underactive Thyroid   traZODone 100 MG tablet Commonly known as: DESYREL Take 1 tablet (100 mg total) by mouth at bedtime.  Indication: [facility]. Go on 10/16/2021.   Specialty: Behavioral Health Why: You have an appointment for medication management services with Dr. Harold Hedge on 10/16/21 at 3:00 pm. Contact information: [place], [place]. Go on 10/17/2021.   Why: You have an appointment for therapy services on 10/17/21 at 9:00 am.    This appointment will be held in person. Contact information: [place], [place], [place] [phone]         [facility]. Call.   Why: Please call to make an appointment for medication assisted treatment appointment. Contact information: [address]  [place], [place] [address] P: [phone] F: [phone]               Follow-up recommendations:   The patient is able to verbalize their individual safety plan to this provider.   # It is recommended to the patient to continue psychiatric medications as prescribed, after discharge from the hospital.     # It is recommended to the patient to follow up with your outpatient psychiatric provider and PCP.   # It was discussed with the patient, the impact of alcohol,  drugs, tobacco have been there overall psychiatric and medical wellbeing, and total abstinence from substance use was recommended the patient.ed.   # Prescriptions provided or sent directly to preferred pharmacy at discharge. Patient agreeable to plan. Given opportunity to ask questions. Appears to feel comfortable with discharge.    # In the event of worsening symptoms, the patient is instructed to call the crisis hotline (988), 911 and or go to the nearest ED for appropriate evaluation and treatment of symptoms. To follow-up with primary care provider for other medical issues, concerns and or health care needs   # Patient was discharged home with his mother, with a plan to follow up as noted above.     Signed: Nicholes Rough, NP 10/13/2021, 1:53 PM

## 2021-10-13 NOTE — Progress Notes (Signed)
Patient appears anxious. Patient denies SI/HI/AVH. Patient complied with morning medication with no reported side effects. PT BP was 94/69 with a pulse of 57 at 0620, MD notified and propanolol held. BP went up to 115/76 with a HR of 63 at 0826. Pt complained of some dizziness and fluids were encouraged. Pt was educated on fall precautions and prevention.Patient remains safe on Q69mn checks and contracts for safety.      10/13/21 0748  Psych Admission Type (Psych Patients Only)  Admission Status Voluntary  Psychosocial Assessment  Patient Complaints Anxiety  Eye Contact Fair  Facial Expression Anxious  Affect Appropriate to circumstance  Speech Logical/coherent  Interaction Assertive  Appearance/Hygiene Unremarkable  Behavior Characteristics Appropriate to situation  Mood Anxious;Pleasant  Thought Process  Coherency WDL  Content WDL  Delusions None reported or observed  Perception WDL  Hallucination None reported or observed  Judgment Limited  Confusion None  Danger to Self  Current suicidal ideation? Denies  Danger to Others  Danger to Others None reported or observed

## 2021-10-13 NOTE — Care Management Important Message (Signed)
Important Message  Patient Details  Name: Paul Bray MRN: [id number] Date of Birth: [birth year]   Medicare Important Message Given:  Yes  Patient informed of right to appeal discharge, provided phone number to [facility]. Patient expressed no interest in appealing discharge at this time. CSW will continue to monitor situation.   Durenda Hurt, LCSWA 10/13/2021, 10:10 AM

## 2021-10-13 NOTE — BHH Group Notes (Signed)
Spiritual care group on grief and loss facilitated by chaplain Katy Keeghan Bialy, BCC   Group Goal:   Support / Education around grief and loss   Members engage in facilitated group support and psycho-social education.   Group Description:   Following introductions and group rules, group members engaged in facilitated group dialog and support around topic of loss, with particular support around experiences of loss in their lives. Group Identified types of loss (relationships / self / things) and identified patterns, circumstances, and changes that precipitate losses. Reflected on thoughts / feelings around loss, normalized grief responses, and recognized variety in grief experience. Group noted Worden's four tasks of grief in discussion.   Group drew on Adlerian / Rogerian, narrative, MI,   Patient Progress: Did not attend.  

## 2021-10-13 NOTE — BH IP Treatment Plan (Signed)
Interdisciplinary Treatment and Diagnostic Plan Update  10/13/2021 Time of Session: 0830 Paul Bray MRN: [id number]  Principal Diagnosis: Undifferentiated schizophrenia [facility])  Secondary Diagnoses: Principal Problem:   Undifferentiated schizophrenia ([place]) Active Problems:   Paranoid schizophrenia ([place])   Insomnia   Opioid use disorder   Stimulant use disorder   Nicotine dependence   GAD (generalized anxiety disorder)   Current Medications:  Current Facility-Administered Medications  Medication Dose Route Frequency Provider Last Rate Last Admin   acetaminophen (TYLENOL) tablet 500 mg  500 mg Oral BID PRN Merlyn Lot E, NP   500 mg at 10/04/21 6578   amantadine (SYMMETREL) capsule 100 mg  100 mg Oral BID Lindell Spar I, NP   100 mg at 10/13/21 0748   haloperidol (HALDOL) tablet 5 mg  5 mg Oral Q6H PRN Nicholes Rough, NP       And   benztropine (COGENTIN) tablet 1 mg  1 mg Oral Q6H PRN Nicholes Rough, NP       buprenorphine (SUBUTEX) sublingual tablet 8 mg  8 mg Sublingual TID Merlyn Lot E, NP   8 mg at 10/13/21 0749   gabapentin (NEURONTIN) capsule 400 mg  400 mg Oral BID Nicholes Rough, NP   400 mg at 10/13/21 0748   haloperidol (HALDOL) tablet 10 mg  10 mg Oral QHS Nkwenti, Tamela Oddi, NP   10 mg at 10/12/21 2106   haloperidol (HALDOL) tablet 10 mg  10 mg Oral Daily Lindell Spar I, NP   10 mg at 10/13/21 0747   nicotine polacrilex (NICORETTE) gum 2 mg  2 mg Oral PRN Merlyn Lot E, NP   2 mg at 10/13/21 0754   polyethylene glycol (MIRALAX / GLYCOLAX) packet 17 g  17 g Oral Daily Merlyn Lot E, NP   17 g at 10/12/21 0751   propranolol (INDERAL) tablet 10 mg  10 mg Oral TID Lindell Spar I, NP   10 mg at 10/10/21 2143   tamsulosin (FLOMAX) capsule 0.8 mg  0.8 mg Oral QPC supper Lindell Spar I, NP   0.8 mg at 10/12/21 1708   thyroid (ARMOUR) tablet 90 mg  90 mg Oral Daily Merlyn Lot E, NP   90 mg at 10/13/21 0748   traZODone (DESYREL) tablet 100 mg  100 mg Oral QHS Nicholes Rough, NP   100 mg at 10/12/21 2107   PTA Medications: Medications Prior to Admission  Medication Sig Dispense Refill Last Dose   acetaminophen (TYLENOL) 500 MG tablet Take 2,000 mg by mouth 2 (two) times daily as needed for headache (pain).       ARIPiprazole (ABILIFY) 20 MG tablet Take 20 mg by mouth at bedtime as needed.      buprenorphine (SUBUTEX) 8 MG SUBL SL tablet Place 8 mg under the tongue 3 (three) times daily.      Fluocinolone Acetonide 0.01 % OIL Apply 3 to 5 drops to affected area of ears once to twice daily as needed.      gabapentin (NEURONTIN) 300 MG capsule Take 300 mg by mouth 2 (two) times daily as needed (sleep).      phentermine 37.5 MG capsule Take 1 capsule (37.5 mg total) by mouth every morning. 30 capsule 0    propranolol (INDERAL) 20 MG tablet Take 1 tablet by mouth 3 (three) times daily as needed.      thyroid (ARMOUR THYROID) 90 MG tablet Take 1 tablet (90 mg total) by mouth daily. 30 tablet 2     Patient  Stressors:    Patient Strengths:    Treatment Modalities: Medication Management, Group therapy, Case management,  1 to 1 session with clinician, Psychoeducation, Recreational therapy.   Physician Treatment Plan for Primary Diagnosis: Undifferentiated schizophrenia ([place]) Long Term Goal(s): Improvement in symptoms so as ready for discharge   Short Term Goals: Ability to identify and develop effective coping behaviors will improve Ability to maintain clinical measurements within normal limits will improve Compliance with prescribed medications will improve Ability to identify triggers associated with substance abuse/mental health issues will improve Ability to identify changes in lifestyle to reduce recurrence of condition will improve Ability to verbalize feelings will improve Ability to disclose and discuss suicidal ideas Ability to demonstrate self-control will improve  Medication Management: Evaluate patient's response, side effects, and tolerance of  medication regimen.  Therapeutic Interventions: 1 to 1 sessions, Unit Group sessions and Medication administration.  Evaluation of Outcomes: Adequate for Discharge  Physician Treatment Plan for Secondary Diagnosis: Principal Problem:   Undifferentiated schizophrenia ([place]) Active Problems:   Paranoid schizophrenia ([place])   Insomnia   Opioid use disorder   Stimulant use disorder   Nicotine dependence   GAD (generalized anxiety disorder)  Long Term Goal(s): Improvement in symptoms so as ready for discharge   Short Term Goals: Ability to identify and develop effective coping behaviors will improve Ability to maintain clinical measurements within normal limits will improve Compliance with prescribed medications will improve Ability to identify triggers associated with substance abuse/mental health issues will improve Ability to identify changes in lifestyle to reduce recurrence of condition will improve Ability to verbalize feelings will improve Ability to disclose and discuss suicidal ideas Ability to demonstrate self-control will improve     Medication Management: Evaluate patient's response, side effects, and tolerance of medication regimen.  Therapeutic Interventions: 1 to 1 sessions, Unit Group sessions and Medication administration.  Evaluation of Outcomes: Adequate for Discharge   RN Treatment Plan for Primary Diagnosis: Undifferentiated schizophrenia ([place]) Long Term Goal(s): Knowledge of disease and therapeutic regimen to maintain health will improve  Short Term Goals: Ability to remain free from injury will improve, Ability to verbalize frustration and anger appropriately will improve, Ability to demonstrate self-control, Ability to participate in decision making will improve, Ability to verbalize feelings will improve, Ability to disclose and discuss suicidal ideas, Ability to identify and develop effective coping behaviors will improve, and Compliance with prescribed  medications will improve  Medication Management: RN will administer medications as ordered by provider, will assess and evaluate patient's response and provide education to patient for prescribed medication. RN will report any adverse and/or side effects to prescribing provider.  Therapeutic Interventions: 1 on 1 counseling sessions, Psychoeducation, Medication administration, Evaluate responses to treatment, Monitor vital signs and CBGs as ordered, Perform/monitor CIWA, COWS, AIMS and Fall Risk screenings as ordered, Perform wound care treatments as ordered.  Evaluation of Outcomes: Adequate for Discharge   LCSW Treatment Plan for Primary Diagnosis: Undifferentiated schizophrenia ([place]) Long Term Goal(s): Safe transition to appropriate next level of care at discharge, Engage patient in therapeutic group addressing interpersonal concerns.  Short Term Goals: Engage patient in aftercare planning with referrals and resources, Increase social support, Increase ability to appropriately verbalize feelings, Increase emotional regulation, Facilitate acceptance of mental health diagnosis and concerns, Facilitate patient progression through stages of change regarding substance use diagnoses and concerns, Identify triggers associated with mental health/substance abuse issues, and Increase skills for wellness and recovery  Therapeutic Interventions: Assess for all discharge needs, 1 to 1 time  with Social worker, Explore available resources and support systems, Assess for adequacy in community support network, Educate family and significant other(s) on suicide prevention, Complete Psychosocial Assessment, Interpersonal group therapy.  Evaluation of Outcomes: Adequate for Discharge   Progress in Treatment: Attending groups: Yes. Participating in groups: Yes. Taking medication as prescribed: Yes. Toleration medication: Yes. Family/Significant other contact made: Yes, individual(s) contacted:  SPE completed  with Ronalee Red, mother  Patient understands diagnosis: Yes. Discussing patient identified problems/goals with staff: Yes. Medical problems stabilized or resolved: Yes. Denies suicidal/homicidal ideation: Yes. Issues/concerns per patient self-inventory: Yes. Other: none  New problem(s) identified: No, Describe:  none   New Short Term/Long Term Goal(s): Patient to continue working towards treatment goals after discharge. Patient no longer meets criteria for inpatient criteria per attending physician. Continue taking medications as prescribed, nursing to provide instructions at discharge. Follow up with all scheduled appointments.   Patient Goals:  No additional goals identified at this time. Patient to continue to work towards original goals identified in initial treatment team meeting. CSW will remain available to patient should they voice additional treatment goals.   Discharge Plan or Barriers: No psychosocial barriers identified at this time, patient to return to place of residence when appropriate for discharge.   Reason for Continuation of Hospitalization: NA, patient expected to discharge on this date.    Scribe for Treatment Team: Larose Kells 10/13/2021 9:08 AM

## 2021-10-13 NOTE — Progress Notes (Signed)
Pt was educated on discharge. Pt was satisfied all belongings were returned. Pt was given discharge papers. Pt discharged to lobby.

## 2021-10-13 NOTE — Plan of Care (Signed)
Problem: Education: Goal: Knowledge of General Education information will improve Description: Including pain rating scale, medication(s)/side effects and non-pharmacologic comfort measures 10/13/2021 0943 by Johann Capers, RN Outcome: Adequate for Discharge 10/13/2021 0828 by Johann Capers, RN Outcome: Progressing   Problem: Health Behavior/Discharge Planning: Goal: Ability to manage health-related needs will improve 10/13/2021 0943 by Johann Capers, RN Outcome: Adequate for Discharge 10/13/2021 0828 by Johann Capers, RN Outcome: Progressing   Problem: Clinical Measurements: Goal: Ability to maintain clinical measurements within normal limits will improve 10/13/2021 0943 by Johann Capers, RN Outcome: Adequate for Discharge 10/13/2021 0828 by Johann Capers, RN Outcome: Progressing Goal: Will remain free from infection 10/13/2021 0943 by Johann Capers, RN Outcome: Adequate for Discharge 10/13/2021 0828 by Johann Capers, RN Outcome: Progressing Goal: Diagnostic test results will improve 10/13/2021 0943 by Johann Capers, RN Outcome: Adequate for Discharge 10/13/2021 0828 by Johann Capers, RN Outcome: Progressing Goal: Respiratory complications will improve 10/13/2021 0943 by Johann Capers, RN Outcome: Adequate for Discharge 10/13/2021 0828 by Johann Capers, RN Outcome: Progressing Goal: Cardiovascular complication will be avoided 10/13/2021 0943 by Johann Capers, RN Outcome: Adequate for Discharge 10/13/2021 0828 by Johann Capers, RN Outcome: Progressing   Problem: Activity: Goal: Risk for activity intolerance will decrease 10/13/2021 0943 by Johann Capers, RN Outcome: Adequate for Discharge 10/13/2021 0828 by Johann Capers, RN Outcome: Progressing   Problem: Nutrition: Goal: Adequate nutrition will be maintained 10/13/2021 0943 by Johann Capers, RN Outcome: Adequate for Discharge 10/13/2021 0828 by Johann Capers, RN Outcome: Progressing   Problem: Coping: Goal: Level of anxiety will decrease 10/13/2021 0943 by Johann Capers, RN Outcome: Adequate for Discharge 10/13/2021 0828 by Johann Capers, RN Outcome: Progressing   Problem: Elimination: Goal: Will not experience complications related to bowel motility 10/13/2021 0943 by Johann Capers, RN Outcome: Adequate for Discharge 10/13/2021 0828 by Johann Capers, RN Outcome: Progressing Goal: Will not experience complications related to urinary retention 10/13/2021 0943 by Johann Capers, RN Outcome: Adequate for Discharge 10/13/2021 0828 by Johann Capers, RN Outcome: Progressing   Problem: Pain Managment: Goal: General experience of comfort will improve 10/13/2021 0943 by Johann Capers, RN Outcome: Adequate for Discharge 10/13/2021 0828 by Johann Capers, RN Outcome: Progressing   Problem: Safety: Goal: Ability to remain free from injury will improve 10/13/2021 0943 by Johann Capers, RN Outcome: Adequate for Discharge 10/13/2021 0828 by Johann Capers, RN Outcome: Progressing   Problem: Skin Integrity: Goal: Risk for impaired skin integrity will decrease 10/13/2021 0943 by Johann Capers, RN Outcome: Adequate for Discharge 10/13/2021 0828 by Johann Capers, RN Outcome: Progressing   Problem: Activity: Goal: Will verbalize the importance of balancing activity with adequate rest periods 10/13/2021 0943 by Johann Capers, RN Outcome: Adequate for Discharge 10/13/2021 0828 by Johann Capers, RN Outcome: Progressing   Problem: Education: Goal: Will be free of psychotic symptoms 10/13/2021 0943 by Johann Capers, RN Outcome: Adequate for Discharge 10/13/2021 0828 by Johann Capers, RN Outcome: Progressing Goal: Knowledge of the prescribed therapeutic regimen will improve 10/13/2021 0943 by Johann Capers, RN Outcome: Adequate for Discharge 10/13/2021 0828 by Johann Capers,  RN Outcome: Progressing   Problem: Coping: Goal: Coping ability will improve 10/13/2021 0943 by Johann Capers, RN Outcome: Adequate for Discharge 10/13/2021 0828 by Johann Capers, RN Outcome: Progressing Goal: Will verbalize feelings 10/13/2021 0943 by Elie Confer,  Darrall Dears, RN Outcome: Adequate for Discharge 10/13/2021 0828 by Johann Capers, RN Outcome: Progressing   Problem: Health Behavior/Discharge Planning: Goal: Compliance with prescribed medication regimen will improve 10/13/2021 0943 by Johann Capers, RN Outcome: Adequate for Discharge 10/13/2021 0828 by Johann Capers, RN Outcome: Progressing   Problem: Nutritional: Goal: Ability to achieve adequate nutritional intake will improve 10/13/2021 0943 by Johann Capers, RN Outcome: Adequate for Discharge 10/13/2021 0828 by Johann Capers, RN Outcome: Progressing   Problem: Role Relationship: Goal: Ability to communicate needs accurately will improve 10/13/2021 0943 by Johann Capers, RN Outcome: Adequate for Discharge 10/13/2021 0828 by Johann Capers, RN Outcome: Progressing Goal: Ability to interact with others will improve 10/13/2021 0943 by Johann Capers, RN Outcome: Adequate for Discharge 10/13/2021 0828 by Johann Capers, RN Outcome: Progressing   Problem: Safety: Goal: Ability to redirect hostility and anger into socially appropriate behaviors will improve 10/13/2021 0943 by Johann Capers, RN Outcome: Adequate for Discharge 10/13/2021 0828 by Johann Capers, RN Outcome: Progressing Goal: Ability to remain free from injury will improve 10/13/2021 0943 by Johann Capers, RN Outcome: Adequate for Discharge 10/13/2021 0828 by Johann Capers, RN Outcome: Progressing   Problem: Self-Care: Goal: Ability to participate in self-care as condition permits will improve 10/13/2021 0943 by Johann Capers, RN Outcome: Adequate for Discharge 10/13/2021 0828 by Johann Capers,  RN Outcome: Progressing   Problem: Self-Concept: Goal: Will verbalize positive feelings about self 10/13/2021 0943 by Johann Capers, RN Outcome: Adequate for Discharge 10/13/2021 0828 by Johann Capers, RN Outcome: Progressing

## 2021-10-13 NOTE — [facility] Suicide Risk Assessment (Signed)
Suicide Risk Assessment  Discharge Assessment    [facility] Discharge Suicide Risk Assessment   Principal Problem: Undifferentiated schizophrenia [facility]) Discharge Diagnoses: Principal Problem:   Undifferentiated schizophrenia ([place]) Active Problems:   Paranoid schizophrenia ([place])   Insomnia   Opioid use disorder   Stimulant use disorder   Nicotine dependence   GAD (generalized anxiety disorder)  Reason For Admission: Paul Bray is a 56 y.o. Caucasian  male patient admitted with hx significant for schizophrenia, paranoid personality, delusional disorder, severe opioid dependence, alcohol use disorder, bipolar disorder, substance induced mood disorder.  Pt presented to the [facility] ED with complaints of worsening auditory & visual hallucinations, paranoia, and +SI, with a plan to "overmedicate" himself to death. He was on MAT (Suboxone) for Opioid abuse, & reported withdrawal symptoms due to noncompliance. Pt was involuntarily transferred to this [facility] for treatment and stabilization of his mental health symptoms.                                          HOSPITAL COURSE During the patient's hospitalization, patient had extensive initial psychiatric evaluation, and follow-up psychiatric evaluations every day.  Psychiatric diagnoses provided upon initial assessment were as follows: Principal Problem:   Undifferentiated schizophrenia ([place]) Active Problems:   Paranoid schizophrenia ([place])   Insomnia   Opioid use disorder   Stimulant use disorder   Nicotine dependence   GAD (generalized anxiety disorder)  Patient's medications were adjusted on admission as follows: - Initiated Olanzapine 2.5 mg po daily for psychosis. - Olanzapine 5 mg po Q hs x 1 for psychosis.  - Olanzapine 7.5 mg po Q bedtime (start 10-03-21). - Resumed gabapentin 300 mg po bid for anxiety. - Continue Hydroxyzine 25 mg po tid prn for anxiety.  - Continue Trazodone 50 mg po Q hs prn for insomnia.  - Resumed  gabapentin 300 mg po bid for anxiety.  - Nicotine 21 mg trans-dermally Q 24 hrs for nicotine withdrawal. - Nicorette gum 2 mg po prn for nicotine withdrawal prn.  - Resumed on Flomax  .04 mg po Q hs for prostate health. - Resumed Thyroid ([place]) 90 mg po daily for hypothyroidism.  - Resumed Miralax 17 gm po daily for constipation. - Resumed Subutex 8 mg SL TID-Pt was educated that he will not be discharged from [facility] with a prescription for this medication. He verbalized understanding and stated that he would return to his outpatient clinic for this medication after discharge.  During the hospitalization, other adjustments were made to the patient's psychiatric medication regimen. Pt's psychosis did not respond to Zyprexa, and it was switched to Haldol. Cogentin was discontinued due to h/o BPH and Amantadine was started for EPS. Medications at discharge are as follows: - Continue haldol 10 mg po BID for psychosis  - Discontinued Cogentin 0.5 mg BID for EPS due to Hx. BPH. - Continue Amantadine 100 mg po bid for eps. - Continue gabapentin 400 mg po bid for anxiety/restless legs. - Continue Trazodone '100mg'$  po Q hs routinely for insomnia.   - Continue Propranolol 10 mg po tid for anxiety. - Continue Nicotine 21 mg trans-dermally Q 24 hrs for nicotine withdrawal.  - Continue Flomax 0.8 mg po Q hs for prostate health.  - Continue Thyroid ([place]) 90 mg po daily for hypothyroidism.  - Continue Miralax 17 gm po daily for constipation.  Patient's care was discussed during  the interdisciplinary team meeting every day during the hospitalization.  The patient denies having side effects to prescribed psychiatric medication. The patient was evaluated each day by a clinical provider to ascertain response to treatment. Improvement was noted by the patient's report of decreasing symptoms, improved sleep and appetite, affect, medication tolerance, behavior, and participation in unit programming.  Patient was asked  each day to complete a self inventory noting mood, mental status, pain, new symptoms, anxiety and concerns.    Symptoms were reported as significantly decreased or resolved completely by discharge. On day of discharge, the patient reports that their mood is stable. The patient denied having suicidal thoughts for more than 48 hours prior to discharge.  Patient denies having homicidal thoughts.  Patient denies having auditory hallucinations.  Patient denies any visual hallucinations or other symptoms of psychosis. The patient was motivated to continue taking medication with a goal of continued improvement in mental health.   The patient reports their target psychiatric symptoms of psychosis, anxiety, depression & insomnia responded well to the psychiatric medications, and the patient reports overall benefit other psychiatric hospitalization. Supportive psychotherapy was provided to the patient. The patient also participated in regular group therapy while hospitalized. Coping skills, problem solving as well as relaxation therapies were also part of the unit programming.  Labs were reviewed with the patient, and abnormal results were discussed with the patient.  Total Time spent with patient: 30 minutes  Musculoskeletal: Strength & Muscle Tone: within normal limits Gait & Station: normal Patient leans: N/A  Psychiatric Specialty Exam  Presentation  General Appearance: Appropriate for Environment; Fairly Groomed  Eye Contact:Good  Speech:Clear and Coherent  Speech Volume:Normal  Handedness:Right  Mood and Affect  Mood:Euthymic  Duration of Depression Symptoms: No data recorded Affect:Congruent  Thought Process  Thought Processes:Coherent  Descriptions of Associations:Intact  Orientation:Full (Time, Place and Person)  Thought Content:Logical  History of Schizophrenia/Schizoaffective disorder:Yes  Duration of Psychotic Symptoms:Greater than six  months  Hallucinations:Hallucinations: None Description of Command Hallucinations: NA Description of Auditory Hallucinations: NA  Ideas of Reference:None  Suicidal Thoughts:Suicidal Thoughts: No  Homicidal Thoughts:Homicidal Thoughts: No   Sensorium  Memory:Immediate Good  Judgment:Good  Insight:Good  Executive Functions  Concentration:Good  Attention Span:Good  [place] of Knowledge:Good  Language:Good  Psychomotor Activity  Psychomotor Activity:Psychomotor Activity: Normal  Assets  Assets:Communication Skills  Sleep  Sleep:Sleep: Good Number of Hours of Sleep: 8.25  Physical Exam: Physical Exam Constitutional:      Appearance: Normal appearance.  HENT:     Head: Normocephalic.     Nose: Nose normal. No congestion or rhinorrhea.  Eyes:     Pupils: Pupils are equal, round, and reactive to light.  Musculoskeletal:        General: Normal range of motion.     Cervical back: Normal range of motion.  Neurological:     Mental Status: He is alert and oriented to person, place, and time.     Coordination: Coordination normal.  Psychiatric:        Behavior: Behavior normal.        Thought Content: Thought content normal.    Review of Systems  Constitutional: Negative.   HENT: Negative.    Respiratory: Negative.    Cardiovascular: Negative.   Gastrointestinal: Negative.   Genitourinary: Negative.   Musculoskeletal: Negative.   Skin: Negative.   Neurological: Negative.   Endo/Heme/Allergies: Negative.   Psychiatric/Behavioral:  Positive for substance abuse (Pt currently on MAT for substance abuse). Negative for depression, hallucinations (  Pt reports that the auditory hallucinations and paranoia have resolved on Haldol), memory loss and suicidal ideas. The patient is not nervous/anxious and does not have insomnia.    Blood pressure 115/76, pulse 63, temperature 97.7 F (36.5 C), temperature source Oral, resp. rate 18, height '5\' 10"'$  (1.778 m),  weight 88.5 kg, SpO2 98 %. Body mass index is 27.99 kg/m.  Mental Status Per Nursing Assessment::   On Admission:  Suicidal ideation indicated by patient, Intention to act on plan to harm others  Demographic Factors:  Male  Loss Factors: NA  Historical Factors: NA  Risk Reduction Factors:   Positive social support and Positive therapeutic relationship  Continued Clinical Symptoms:  More than one psychiatric diagnosis-Pt's symptoms are currently resolving on current medications. He currently denies SI/HI/AVH and is verbally contracting contracting for safety outside of this [facility].  Cognitive Features That Contribute To Risk:  None    Suicide Risk:  Minimal: No identifiable suicidal ideation.  Patients presenting with no risk factors but with morbid ruminations; may be classified as minimal risk based on the severity of the depressive symptoms   Follow-up [facility]. Go on 10/16/2021.   Specialty: Behavioral Health Why: You have an appointment for medication management services with Dr. Harold Hedge on 10/16/21 at 3:00 pm. Contact information: [place], [place]. Go on 10/17/2021.   Why: You have an appointment for therapy services on 10/17/21 at 9:00 am.    This appointment will be held in person. Contact information: [place], [place], [place] [address] [phone]         [facility]. Call.   Why: Please call to make an appointment for medication assisted treatment appointment. Contact information: [address]  [place], [place] [address] P: [phone] F: [phone]               Plan Of Care/Follow-up recommendations:  he patient is able to verbalize their individual safety plan to this provider.  # It is recommended to the patient to continue  psychiatric medications as prescribed, after discharge from the hospital.    # It is recommended to the patient to follow up with your outpatient psychiatric provider and PCP.  # It was discussed with the patient, the impact of alcohol, drugs, tobacco have been there overall psychiatric and medical wellbeing, and total abstinence from substance use was recommended the patient.ed.  # Prescriptions provided or sent directly to preferred pharmacy at discharge. Patient agreeable to plan. Given opportunity to ask questions. Appears to feel comfortable with discharge.    # In the event of worsening symptoms, the patient is instructed to call the crisis hotline (988), 911 and or go to the nearest ED for appropriate evaluation and treatment of symptoms. To follow-up with primary care provider for other medical issues, concerns and or health care needs  # Patient was discharged home with his mother, with a plan to follow up as noted above.   Nicholes Rough, NP 10/13/2021, 1:46 PM

## 2021-10-13 NOTE — Plan of Care (Signed)
  Problem: Education: Goal: Knowledge of General Education information will improve Description: Including pain rating scale, medication(s)/side effects and non-pharmacologic comfort measures Outcome: Progressing   Problem: Health Behavior/Discharge Planning: Goal: Ability to manage health-related needs will improve Outcome: Progressing   Problem: Clinical Measurements: Goal: Ability to maintain clinical measurements within normal limits will improve Outcome: Progressing Goal: Will remain free from infection Outcome: Progressing Goal: Diagnostic test results will improve Outcome: Progressing Goal: Respiratory complications will improve Outcome: Progressing Goal: Cardiovascular complication will be avoided Outcome: Progressing   Problem: Activity: Goal: Risk for activity intolerance will decrease Outcome: Progressing   Problem: Nutrition: Goal: Adequate nutrition will be maintained Outcome: Progressing   Problem: Coping: Goal: Level of anxiety will decrease Outcome: Progressing   Problem: Elimination: Goal: Will not experience complications related to bowel motility Outcome: Progressing Goal: Will not experience complications related to urinary retention Outcome: Progressing   Problem: Pain Managment: Goal: General experience of comfort will improve Outcome: Progressing   Problem: Safety: Goal: Ability to remain free from injury will improve Outcome: Progressing   Problem: Skin Integrity: Goal: Risk for impaired skin integrity will decrease Outcome: Progressing   Problem: Activity: Goal: Will verbalize the importance of balancing activity with adequate rest periods Outcome: Progressing   Problem: Education: Goal: Will be free of psychotic symptoms Outcome: Progressing Goal: Knowledge of the prescribed therapeutic regimen will improve Outcome: Progressing   Problem: Coping: Goal: Coping ability will improve Outcome: Progressing Goal: Will verbalize  feelings Outcome: Progressing   Problem: Health Behavior/Discharge Planning: Goal: Compliance with prescribed medication regimen will improve Outcome: Progressing   Problem: Nutritional: Goal: Ability to achieve adequate nutritional intake will improve Outcome: Progressing   Problem: Role Relationship: Goal: Ability to communicate needs accurately will improve Outcome: Progressing Goal: Ability to interact with others will improve Outcome: Progressing   Problem: Safety: Goal: Ability to redirect hostility and anger into socially appropriate behaviors will improve Outcome: Progressing Goal: Ability to remain free from injury will improve Outcome: Progressing   Problem: Self-Care: Goal: Ability to participate in self-care as condition permits will improve Outcome: Progressing   Problem: Self-Concept: Goal: Will verbalize positive feelings about self Outcome: Progressing

## 2021-10-13 NOTE — Group Note (Signed)
LCSW Group Therapy Note   Group Date: 10/13/2021 Start Time: 1300 End Time: 1400  Date/Time:  Type of Therapy and Topic:  Group Therapy:  triggers  Participation Level:  Did not attend   Description of Group:   Recognizing Triggers: Patients defined triggers and discussed the importance of recognizing their personal warning signs. Patients identified their own triggers and how they tend to cope with stressful situations. Patients discussed areas such as people, places, things, and thoughts that rigger certain emotions for them. CSW provided support to patients and discussed safety planning for when these triggers occur. Group participants had opportunities to share openly with the group and participate in a group discussion while providing support and feedback to their peers.  Therapeutic Goals: Patient will identify triggers that are contributing to a problem in their life Patient will identify unwanted behaviors and feelings associated with a trigger.  Patient will share with other group members strategies to confront and avoid triggers so that they may be able to react appropriately to triggers in daily life.    Summary of Patient Progress:  Did not attend    Therapeutic Modalities:   Cognitive Behavioral Therapy Solution Focused Therapy Motivational [place], [place] 10/13/2021  2:02 PM

## 2021-10-13 NOTE — Group Note (Signed)
Recreation Therapy Group Note   Group Topic:Problem Solving  Group Date: 10/13/2021 Start Time: 0930 End Time: 1000 Facilitators: Victorino Sparrow, LRT,CTRS Location: [address]   Goal Area(s) Addresses:  Patient will effectively work with peer towards shared goal.  Patient will identify skills used to make activity successful.  Patient will share challenges and verbalize solution-driven approaches used. Patient will identify how skills used during activity can be used to reach post d/c goals.   Group Description:  [employer]. Patients were provided the following materials: 2 drinking straws, 5 rubber bands, 5 paper clips, 2 index cards and 2 drinking cups. Using the provided materials patients were asked to build a launching mechanism to launch a ping pong ball across the room, approximately 10 feet. Patients were divided into teams of 3-5. Instructions required all materials be incorporated into the device, functionality of items left to the peer group's discretion.   Affect/Mood: N/A   Participation Level: Did not attend    Clinical Observations/Individualized Feedback:     Plan: Continue to engage patient in RT group sessions 2-3x/week.   Victorino Sparrow, LRT,CTRS 10/13/2021 1:22 PM

## 2021-10-13 NOTE — Progress Notes (Signed)
  [facility] Adult Case Management Discharge Plan :  Will you be returning to the same living situation after discharge:  Yes,  Patient to return to place of residence.  At discharge, do you have transportation home?: Yes,  Patient's mother to assist with transportation from hospital.  Do you have the ability to pay for your medications: Yes,  [employer] Patient informed of right to appeal discharge, provided phone number to [facility]. Patient expressed no interest in appealing discharge at this time. CSW will continue to monitor situation.q  Release of information consent forms completed and in the chart;  Patient's signature needed at discharge.  Patient to Follow up at:  Follow-up Information     [facility]. Go on 10/16/2021.   Specialty: Behavioral Health Why: You have an appointment for medication management services with Dr. Harold Hedge on 10/16/21 at 3:00 pm. Contact information: [place], [place]. Go on 10/17/2021.   Why: You have an appointment for therapy services on 10/17/21 at 9:00 am.    This appointment will be held in person. Contact information: [place], [place], [place] [address] [phone]         [facility]. Call.   Why: Please call to make an appointment for medication assisted treatment appointment. Contact information: [address]  [place], [place] [address] P: [phone] F: [phone]                Next level of care provider has access to [place] and Suicide Prevention discussed: Yes,  SPE completed with  Estell Harpin, mother.   Has patient been referred to the Quitline?: Yes, faxed on 10/13/2021 Tobacco Use: High Risk (10/03/2021)   Patient History    Smoking Tobacco Use: Former    Smokeless Tobacco Use: Current     Passive Exposure: Not on file   Patient has been referred for addiction treatment: Yes Patient has existing treatment through [facility] for MAT.  Social History   Substance and Sexual Activity  Drug Use Not Currently   Types: Cocaine, IV, Heroin, MDMA (Ecstacy)   Comment: last reported use; cocaine and heroin in 2019; MDMA Feb 2023  Alcohol Level    Component Value Date/Time   [facility] <10 09/27/2021 1704   Social History   Substance and Sexual Activity  Alcohol Use Not Currently   Comment: quit Oct 2019    Durenda Hurt, LCSWA 10/13/2021, 10:10 AM

## 2021-10-16 ENCOUNTER — Other Ambulatory Visit (HOSPITAL_COMMUNITY): Payer: Self-pay | Admitting: *Deleted

## 2021-10-16 ENCOUNTER — Ambulatory Visit (HOSPITAL_BASED_OUTPATIENT_CLINIC_OR_DEPARTMENT_OTHER): Payer: Medicare Other | Admitting: Medical

## 2021-10-16 ENCOUNTER — Encounter (HOSPITAL_COMMUNITY): Payer: Self-pay | Admitting: Medical

## 2021-10-16 VITALS — BP 124/76 | HR 70 | Ht 70.0 in | Wt 196.0 lb

## 2021-10-16 DIAGNOSIS — G2401 Drug induced subacute dyskinesia: Secondary | ICD-10-CM

## 2021-10-16 DIAGNOSIS — C719 Malignant neoplasm of brain, unspecified: Secondary | ICD-10-CM

## 2021-10-16 DIAGNOSIS — F22 Delusional disorders: Secondary | ICD-10-CM

## 2021-10-16 DIAGNOSIS — Z923 Personal history of irradiation: Secondary | ICD-10-CM | POA: Diagnosis not present

## 2021-10-16 DIAGNOSIS — F1120 Opioid dependence, uncomplicated: Secondary | ICD-10-CM | POA: Diagnosis not present

## 2021-10-16 DIAGNOSIS — I639 Cerebral infarction, unspecified: Secondary | ICD-10-CM

## 2021-10-16 DIAGNOSIS — G4719 Other hypersomnia: Secondary | ICD-10-CM

## 2021-10-16 DIAGNOSIS — I10 Essential (primary) hypertension: Secondary | ICD-10-CM

## 2021-10-16 DIAGNOSIS — F600 Paranoid personality disorder: Secondary | ICD-10-CM

## 2021-10-16 DIAGNOSIS — G250 Essential tremor: Secondary | ICD-10-CM

## 2021-10-16 DIAGNOSIS — F0634 Mood disorder due to known physiological condition with mixed features: Secondary | ICD-10-CM

## 2021-10-16 DIAGNOSIS — F1610 Hallucinogen abuse, uncomplicated: Secondary | ICD-10-CM

## 2021-10-16 DIAGNOSIS — G4710 Hypersomnia, unspecified: Secondary | ICD-10-CM

## 2021-10-16 DIAGNOSIS — D330 Benign neoplasm of brain, supratentorial: Secondary | ICD-10-CM

## 2021-10-16 DIAGNOSIS — F1921 Other psychoactive substance dependence, in remission: Secondary | ICD-10-CM

## 2021-10-16 DIAGNOSIS — E039 Hypothyroidism, unspecified: Secondary | ICD-10-CM

## 2021-10-16 DIAGNOSIS — I6381 Other cerebral infarction due to occlusion or stenosis of small artery: Secondary | ICD-10-CM

## 2021-10-16 MED ORDER — VALBENAZINE TOSYLATE 40 MG PO CAPS: 40.0000 mg | ORAL_CAPSULE | Freq: Every day | ORAL | 0 refills | Status: DC

## 2021-10-16 NOTE — Progress Notes (Signed)
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 Caucasian  male patient admitted with hx significant for schizophrenia, paranoid personality, delusional disorder, severe opioid dependence, alcohol use disorder, bipolar disorder, substance induced mood disorder.  Pt presented to the [facility] ED with complaints of worsening auditory & visual hallucinations, paranoia, and +SI, with a plan to "overmedicate" himself to death. He was on MAT (Suboxone) for Opioid abuse, & reported withdrawal symptoms due to noncompliance. Pt was involuntarily transferred to this [facility] for treatment and stabilization of his mental health symptoms.  Principal Problem: Undifferentiated schizophrenia [facility]) Discharge Diagnoses: Principal Problem:   Undifferentiated schizophrenia ([place]) Active Problems:   Paranoid schizophrenia ([place])   Insomnia   Opioid use disorder   Stimulant use disorder   Nicotine dependence   GAD (generalized anxiety disorder)  HOSPITAL COURSE During the patient's hospitalization, patient had extensive initial psychiatric evaluation, and follow-up psychiatric evaluations every day.   Psychiatric diagnoses provided upon initial assessment were as follows: Principal Problem:   Undifferentiated schizophrenia ([place]) Active Problems:   Paranoid schizophrenia ([place])    Insomnia   Opioid use disorder   Stimulant use disorder   Nicotine dependence   GAD (generalized anxiety disorder)   Patient's medications were adjusted on admission as follows: - Initiated Olanzapine 2.5 mg po daily for psychosis. - Olanzapine 5 mg po Q hs x 1 for psychosis.  - Olanzapine 7.5 mg po Q bedtime (start 10-03-21). - Resumed gabapentin 300 mg po bid for anxiety. - Continue Hydroxyzine 25 mg po tid prn for anxiety.  - Continue Trazodone 50 mg po Q hs prn for insomnia.  - Resumed gabapentin 300 mg po bid for anxiety.  - Nicotine 21 mg trans-dermally Q 24 hrs for nicotine withdrawal. - Nicorette gum 2 mg po prn for nicotine withdrawal prn.  - Resumed on Flomax  .04 mg po Q hs for prostate health. - Resumed Thyroid (Armour) 90 mg po daily for hypothyroidism.  - Resumed Miralax 17 gm po daily for constipation. - Resumed Subutex 8 mg SL TID-Pt was educated that he will not be discharged from [facility] with a prescription for this medication. He verbalized understanding and stated that he would return to his outpatient clinic for this medication after discharge.   During the hospitalization, other adjustments were made to the patient's psychiatric medication regimen. Pt's psychosis did not respond to Zyprexa, and it was switched to Haldol. Cogentin was discontinued due to h/o BPH and Amantadine was started for EPS. Medications at discharge are as follows: - Continue haldol 10 mg po BID for psychosis  - Discontinued Cogentin 0.5 mg BID for EPS due to Hx. BPH. - Continue Amantadine 100 mg po bid for eps. - Continue gabapentin 400 mg po bid for anxiety/restless legs. - Continue Trazodone '100mg'$  po Q  hs routinely for insomnia.   - Continue Propranolol 10 mg po tid for anxiety. - Continue Nicotine 21 mg trans-dermally Q 24 hrs for nicotine withdrawal.  - Continue Flomax 0.8 mg po Q hs for prostate health.  - Continue Thyroid (Armour) 90 mg po daily for hypothyroidism.  - Continue Miralax 17 gm  po daily for constipation.  Follow-up recommendations:   The patient is able to verbalize their individual safety plan to this provider.   # It is recommended to the patient to continue psychiatric medications as prescribed, after discharge from the hospital.     # It is recommended to the patient to follow up with your outpatient psychiatric provider and PCP.   # It was discussed with the patient, the impact of alcohol, drugs, tobacco have been there overall psychiatric and medical wellbeing, and total abstinence from substance use was recommended the patient.ed.   # Prescriptions provided or sent directly to preferred pharmacy at discharge. Patient agreeable to plan. Given opportunity to ask questions. Appears to feel comfortable with discharge.    # In the event of worsening symptoms, the patient is instructed to call the crisis hotline (988), 911 and or go to the nearest ED for appropriate evaluation and treatment of symptoms. To follow-up with primary care provider for other medical issues, concerns and or health care needs   # Patient was discharged home with his mother, with a plan to follow up as noted above.    Today Paul Bray c/o extreme fatigue a symptom that seems to coincide with his psychosis whose onset appears to have been after suffered undiagnosed infarctions in the Lt Basal Ganglia (Excessive daytime sleepiness (EDS) is a prevalent symptom among stroke survivors. This symptom is an independent risk factor for stroke and may reduce stroke survivors' quality of life, cognitive functioning, and daytime functional performance). Both he and his mother report the voices have been controlled but he is now experiencing involuntary motor movements despite Rx for Amantadine to control them  (see DC Summary above). He reports he went to ED because "I lost it" He admits to using Ectasy prior to his breakdown. He denies any cravings for this. He did return to [facility] and saw dRHe says he  switched his MAT form Subutex tabs to SL Strips which he regrets because the cost was much more ($90). He also notes he is continuing to have BPPH symptoms despite Flomax rx. He says the Armour thyroid seems to be effective and better than T4 alone(levothyroxine)   Visit Diagnosis:    ICD-10-CM   1. Status post gamma knife treatment  Z92.3     2. Opioid dependence on maintenance agonist therapy, no symptoms (HCC)  F11.20    Subutex    3. Polysubstance dependence in early, early partial, sustained full, or sustained partial remission ([place])  F19.21     4. 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 Bipolar and related disorder due to another medical condition with mixed features  F06.34     6. 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 Persistent hypersomnia  G47.10     9. Multiple lacunar infarcts (HCC)  I63.81     10. Infarction of left basal ganglia (HCC)  I63.9     11. Delusional disorder, persecutory type, multiple episodes, currently in acute episode ([place])  F22  12. 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 Familial tremor  G25.0     14. Hypothyroidism, unspecified type  E03.9     15. Accelerated hypertension  I10     16. Excessive daytime sleepiness  G47.19    Excessive daytime sleepiness (EDS) is a prevalent symptom among stroke survivors. This symptom is an independent risk factor for stroke,affecting life quali  .Marland Kitchen    17. Tardive dyskinesia  G24.01    Haldol induced      Past Psychiatric History:  NOVANT Alcohol intoxication 03/26/2017  Alcohol withdrawal delirium 03/26/2017  Chronic daily headache 02/25/2017  Alcohol use disorder, severe, dependence 02/22/2017  Heroin dependence MAT Methadone [place]/Subutex TBR 01/2018  Anxiety disorder, unspecified 01/20/2017  Paranoia 03/2018    Whitehall, Stella O, NP - 04/12/2017 12:38 PM  EST Formatting of this note might be different from the original. [facility] Psychiatry - Substance Abuse PHP/IOP Intake Assessment  BH Formulate an individual and appropriate relapse prevention plan.   [place] [facility] Problem: Formation of a relapse prevention plan    [facility] Uncomplicated opioid dependence (CMS/HCC) 12/24/2017  1. Discharge from hospital. 2. Follow up at [facility].                        [facility]             12/15/2007 -05/21/2010    Past Medical History:  Past Medical History:  Diagnosis Date   ADHD    Anxiety    Brain tumor ([place])    balance and occ memory issues and headaches   Brain tumor ([place])    sees [facility] q year   Depression    Headache    migraine and tension   Hypothyroidism    Soft tissue mass    left shoulder   Substance abuse ([place])     Past Surgical History:  Procedure Laterality Date   colonscopy     gamma knife radiation 2018 for brain tumor'     hematoma removed from arm Left    MASS EXCISION Left 06/30/2019   Procedure: EXCISION OF SOFT TISSUE  MASS LEFT SHOULDER;  Surgeon: Armandina Gemma, MD;  Location: [place];  Service: General;  Laterality: Left;  LMA    Family Psychiatric History:  Family Psychiatric History:  Alcoholism and drug abuse on father's side   Family History:  Family History [facility] Relation Name Status Comments  Brother   Social worker    Father   Alive    Maternal Grandfather   Deceased    Maternal Grandmother   Deceased    Mother   Alive    Paternal Grandfather   Deceased    Paternal Grandmother   Deceased    Sister   Alive         Social History:  Social History   Social History           Marital status: Single      Spouse name: NA   Number of children: NA   Years of education: 55   Highest education level:    Occupational History   Not on file  Tobacco Use   Smoking status: Current Every Day Smoker       Types: Cigarettes   Smokeless tobacco: Never Used   Tobacco comment: quit Jan 2020  Vaping Use  Vaping Use: Never used  Substance and Sexual Activity   Alcohol use: Not Currently      Comment: quit oct 2019   Drug use: Yes      Types: Cocaine,Heroin  IV      Comment: last used OCT  2019 per pt Started MAT Buprenorphine   Sexual activity: Not on file  Other Topics Concern      Social History Narrative         Emergency planning/management officer Strain:    Difficulty of Paying Living Expenses: Disability  Food Insecurity:    Worried About Charity fundraiser in the Last Year: On disability   Arboriculturist in the Last Year:   Transportation Needs:    Film/video editor (Medical): Uses Ecologist (Non-Medical):   Physical Activity:    Days of Exercise per Week:    Minutes of Exercise per Session:   Stress:    Feeling of Stress : Constant   Social Connections:    Frequency of Communication with Friends and Family: Mother recently kicked him out of house   Frequency of Social Gatherings with Friends and Family: Lives alone-   Attends Religious Services: No   Active Member of Clubs or Organizations: Not active with AA /NA   Attends Archivist Meetings:        Allergies:  Allergies  Allergen Reactions   Naloxone Palpitations    Pt report MAT Rx is Subutex/Buprenorphine only   Amphetamine-Dextroamphetamine Other (See Comments)    Metabolic Disorder Labs: Lab Results  Component Value Date   HGBA1C 5.0 10/03/2021   MPG 96.8 10/03/2021   No results found for: "PROLACTIN" Lab Results  Component Value Date   CHOL 191 10/03/2021   TRIG 92 10/03/2021   HDL 48 10/03/2021   CHOLHDL 4.0 10/03/2021   VLDL 18 10/03/2021   LDLCALC 125 (H) 10/03/2021   Lab Results  Component Value Date   TSH 2.166 01/09/2018   TSH 1.459 08/03/2017    Therapeutic Level Labs: No results found for: "LITHIUM" No results found for: "VALPROATE" No results found for:  "CBMZ"  Current Medications: Current Outpatient Medications  Medication Sig Dispense Refill   Buprenorphine HCl-Naloxone HCl 8-2 MG FILM Place under the tongue.     haloperidol (HALDOL) 10 MG tablet Take 1 tablet (10 mg total) by mouth 2 (two) times daily. 60 tablet 0   nicotine (NICODERM CQ - DOSED IN MG/24 HOURS) 21 mg/24hr patch Place 1 patch (21 mg total) onto the skin daily. 30 patch 0   polyethylene glycol (MIRALAX / GLYCOLAX) 17 g packet Take 17 g by mouth daily. 30 each 0   propranolol (INDERAL) 10 MG tablet Take 1 tablet (10 mg total) by mouth 3 (three) times daily. 90 tablet 0   tamsulosin (FLOMAX) 0.4 MG CAPS capsule Take 2 capsules (0.8 mg total) by mouth daily after supper. 60 capsule 0   thyroid (ARMOUR THYROID) 90 MG tablet Take 1 tablet (90 mg total) by mouth daily. 30 tablet 0   traZODone (DESYREL) 100 MG tablet Take 1 tablet (100 mg total) by mouth at bedtime. 30 tablet 0   valbenazine (INGREZZA) 40 MG capsule Take 1 capsule (40 mg total) by mouth daily. 30 capsule 0   No current facility-administered medications for this visit.   PDMP-6/20 Bupe/Nal 8/2 mg strips;6/1 Phentermine #30  5/30 Adderall 30 mg  Musculoskeletal: Strength & Muscle Tone:  Involuntary leg/arm movements at rest  Gait & Station: normal Patient leans: Front  Psychiatric Specialty Exam: Review of Systems  Constitutional:  Positive for activity change and fatigue. Negative for appetite change, chills, diaphoresis, fever and unexpected weight change.  HENT: Negative.  Negative for congestion, postnasal drip, rhinorrhea, sinus pressure, sinus pain, sneezing, sore throat, tinnitus and trouble swallowing.   Eyes:  Negative for photophobia, pain, discharge, redness and itching.  Respiratory:  Negative for apnea, cough, choking, chest tightness, shortness of breath, wheezing and stridor.   Cardiovascular:  Negative for chest pain, palpitations and leg swelling.  Gastrointestinal:  Positive for constipation  (Rx Miralax). Negative for abdominal distention, abdominal pain, anal bleeding, blood in stool, diarrhea, nausea, rectal pain and vomiting.  Endocrine: Negative for cold intolerance, heat intolerance, polydipsia, polyphagia and polyuria.  Genitourinary:  Positive for difficulty urinating (BPH rx Flomax). Negative for decreased urine volume, dysuria, enuresis, flank pain, frequency, genital sores, hematuria, penile discharge, penile pain, penile swelling, scrotal swelling, testicular pain and urgency.  Musculoskeletal:  Negative for arthralgias, back pain, gait problem, joint swelling, myalgias, neck pain and neck stiffness.       See tremors  Skin:  Negative for color change (Face looks much better Rosacea well controlled), pallor, rash and wound.  Allergic/Immunologic: Negative for environmental allergies, food allergies and immunocompromised state.  Neurological:  Positive for tremors. Negative for dizziness, seizures, syncope, facial asymmetry, speech difficulty, weakness, light-headedness, numbness and headaches.       Familial tremor New involuntary motor movements  Hematological:  Negative for adenopathy. Does not bruise/bleed easily.  Psychiatric/Behavioral:  Positive for behavioral problems (recent Ectasy use), dysphoric mood and sleep disturbance (see HPI). Negative for agitation, confusion, decreased concentration, hallucinations, self-injury and suicidal ideas. The patient is not nervous/anxious and is not hyperactive.     Blood pressure 124/76, pulse 70, height '5\' 10"'$  (1.778 m), weight 196 lb (88.9 kg).Body mass index is 28.12 kg/m.  General Appearance:  Fatigued  Eye Contact:  Good  Speech:  Clear and Coherent  Volume:  Normal  Mood:  Dysphoric  Affect:  Appropriate and Congruent  Thought Process:  Coherent, Goal Directed, and Descriptions of Associations: Intact  Orientation:  Full (Time, Place, and Person)  Thought Content: WDL and Logical   Suicidal Thoughts:  No  Homicidal  Thoughts:  No  Memory:  Immediate;   Good Recent;   Fair Remote;   Fair  Judgement:  Impaired  Insight:  Lacking  Psychomotor Activity:  Restlessness, TD, and Tremor  Concentration:  Concentration: Fair and Attention Span: Fair  Recall:  [employer] of Knowledge:  WDL  Language: Good  Akathisia:  Yes  Handed:  Right  AIMS (if indicated): done  Assets:  Desire for Improvement Financial Resources/Insurance Housing Resilience Social Support Talents/Skills Transportation Vocational/Educational  ADL's:  Intact  Cognition: Impaired,  Moderate  Sleep:  Poor   Screenings:AIMS    Assessment : Believe his psychosis and hypersomnia can be attributed to the Lt Basilar Lacunar infarcts he was unaware of -suspect his Cocaine use and hypertension were contributing factors Psychosis-controlled with Haldol but appears to be developing TD despite Amantadine rx Polysubstance dependence: Opiate controlled with Suboxone MAT all other substances are in remission.recent use of Ectasy resulted in exacerbation of psychosis with SI BPH not well controlled   and Plan: Rx Ingrezza for TD '40mg'$  Continue D/C meds except Amantadine Thyroid Panel Continue [facility] MAT and Counseling  Trial Phentermine and assess response next week/Consider return to [facility] ? FU 1 week -sooner  if needed    [name], PA-C 10/16/2021, 5:04 PM

## 2021-10-17 ENCOUNTER — Other Ambulatory Visit (HOSPITAL_COMMUNITY): Payer: Self-pay | Admitting: *Deleted

## 2021-10-17 DIAGNOSIS — E039 Hypothyroidism, unspecified: Secondary | ICD-10-CM

## 2021-10-20 MED ORDER — MODAFINIL 200 MG PO TABS: 200.0000 mg | ORAL_TABLET | Freq: Every day | ORAL | 0 refills | Status: DC

## 2021-10-20 MED ORDER — DIPHENHYDRAMINE HCL 50 MG PO TABS: ORAL_TABLET | ORAL | 2 refills | Status: DC

## 2021-10-22 ENCOUNTER — Telehealth (HOSPITAL_COMMUNITY): Payer: Self-pay

## 2021-10-22 NOTE — Telephone Encounter (Signed)
Medication management - prior authorization for patient's prescribed Ingrezza 400 mg submitted online with CoverMyMeds to patient's [facility] clinical for review.  PA pending.

## 2021-10-23 ENCOUNTER — Encounter (HOSPITAL_COMMUNITY): Payer: Self-pay | Admitting: Medical

## 2021-10-23 ENCOUNTER — Ambulatory Visit (HOSPITAL_BASED_OUTPATIENT_CLINIC_OR_DEPARTMENT_OTHER): Payer: Medicare Other | Admitting: Medical

## 2021-10-23 DIAGNOSIS — G250 Essential tremor: Secondary | ICD-10-CM

## 2021-10-23 DIAGNOSIS — G2401 Drug induced subacute dyskinesia: Secondary | ICD-10-CM

## 2021-10-23 DIAGNOSIS — F1120 Opioid dependence, uncomplicated: Secondary | ICD-10-CM | POA: Diagnosis not present

## 2021-10-23 DIAGNOSIS — Z923 Personal history of irradiation: Secondary | ICD-10-CM

## 2021-10-23 DIAGNOSIS — C719 Malignant neoplasm of brain, unspecified: Secondary | ICD-10-CM

## 2021-10-23 DIAGNOSIS — Z8659 Personal history of other mental and behavioral disorders: Secondary | ICD-10-CM

## 2021-10-23 DIAGNOSIS — D330 Benign neoplasm of brain, supratentorial: Secondary | ICD-10-CM | POA: Diagnosis not present

## 2021-10-23 DIAGNOSIS — F1921 Other psychoactive substance dependence, in remission: Secondary | ICD-10-CM | POA: Diagnosis not present

## 2021-10-23 DIAGNOSIS — E039 Hypothyroidism, unspecified: Secondary | ICD-10-CM

## 2021-10-23 DIAGNOSIS — F060 Psychotic disorder with hallucinations due to known physiological condition: Secondary | ICD-10-CM

## 2021-10-23 DIAGNOSIS — I10 Essential (primary) hypertension: Secondary | ICD-10-CM

## 2021-10-23 DIAGNOSIS — G4711 Idiopathic hypersomnia with long sleep time: Secondary | ICD-10-CM

## 2021-10-23 DIAGNOSIS — I639 Cerebral infarction, unspecified: Secondary | ICD-10-CM

## 2021-10-23 DIAGNOSIS — I6381 Other cerebral infarction due to occlusion or stenosis of small artery: Secondary | ICD-10-CM

## 2021-10-23 MED ORDER — HALOPERIDOL 10 MG PO TABS
5.0000 mg | ORAL_TABLET | Freq: Two times a day (BID) | ORAL | 0 refills | Status: DC
Start: 2021-10-23 — End: 2021-11-06

## 2021-10-23 MED ORDER — MODAFINIL 200 MG PO TABS: 600.0000 mg | ORAL_TABLET | Freq: Every day | ORAL | 0 refills | Status: DC

## 2021-10-23 NOTE — Progress Notes (Signed)
Marked improvement from last visit

## 2021-10-23 NOTE — Progress Notes (Signed)
BH MD/PA/NP OP Progress Note  10/23/2021 4:07 PM NASIF BOS  MRN:  [id number]  Chief Complaint:  Chief Complaint  Patient presents with   Follow-up   Addiction Problem   Lacunar infarcts   Psychotic disorder    Auditory hallucinations/Paranoia   Medication Reaction   Medication Refill   HPI: Paul Bray returns for 1 week FU 10 days S/P discharge from [facility] for mwedication stabilization for his psychosis with SI apparently triggerd by his use of Ectasy after 4 years of abstinence from all drugs. He is maintained on Buprenorphine for his opioid dependency. He had significant TD last week on 10 mg of Haldol BID.  He called c/o worsening over weekend and was started on Benadryl as Amantadine was ineffective and worsening his hypersomnia.Attempt to deal with his fatigue/insomnia with Phentermine was unsuccessful he requested to return to Modafinil and was started on 200 mg daily.He had a partial response to this dose. He had been on 400 mg max previously but admitted to taking higher doses when psychosis was active and he took medications by how he felt.. Review of literature suggested maximum dose without affecting BP and pulse increase.(He was of the belief '1000mg'$  was max)  He does not want to take Benadryl because of fatigue factor and he has been given a grant for his TD reaction to Haldol for Ingrezza. He says he is feeling much better and able to take meds as prescribed. He has not gotten his blood drawn for [facility] panel yet. He is requesting (as is his mother) the brain MRI not done be requested.  Visit Diagnosis:    ICD-10-CM   1. Polysubstance dependence in early, early partial, sustained full, or sustained partial remission ([place])  F19.21    Alcohol Cocaine Heroin    2. Opioid dependence on maintenance agonist therapy, no symptoms (HCC)  F11.20     3. 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 Status post gamma  knife treatment  Z92.3     6. Infarction of left basal ganglia (HCC)  I63.9     7. Multiple lacunar infarcts (HCC)  I63.81     8. Idiopathic hypersomnia with long sleep time  G47.11     9. 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 Accelerated hypertension  I10     11. Acquired hypothyroidism  E03.9     12. Familial tremor  G25.0     13. Tardive dyskinesia  G24.01     14. History of ADHD  Z86.59       Past Psychiatric History:           [facility]  [facility] OP [place] 6/17/-03/04/2020  Pt with Opiate dependence MAT Suboxone at [facility] Dr Lynnda Shields C/O of paranoia he is aware of but cant stop/completely control.Has rx for Risperdal from [employer]. Also hx of Gamma Knife tradiation fo benign brain tumor ?2018 Says has records Requesting Psychiatrist/medication mangement for paranoia hx [place] OP [place] 08/21/2021 - NOW [place] INPATIENT  Date of Admission:  10/02/2021 Date of Discharge: 10/13/2021  [facility] Alcohol intoxication 03/26/2017  Alcohol withdrawal delirium 03/26/2017  Chronic daily headache 02/25/2017  Alcohol use disorder, severe, dependence 02/22/2017  Heroin dependence MAT Methadone [place]/Subutex TBR 01/2018  Anxiety disorder, unspecified 01/20/2017  Paranoia 03/2018    [facility] Laurell Roof  O, NP - 04/12/2017 12:38 PM EST Formatting of this note might be different from the original. [facility] - Substance Abuse PHP/IOP Intake Assessment  BH Formulate an individual and appropriate relapse prevention plan.   [place] SU Problem: Formation of a relapse prevention plan    [facility] Uncomplicated opioid dependence (CMS/HCC) 12/24/2017  1. Discharge from hospital. 2. Follow up at [facility].                        [facility]             12/15/2007 -05/21/2010     Past Medical History:  Past Medical  History:  Diagnosis Date   ADHD    Anxiety    Brain tumor ([place])    balance and occ memory issues and headaches   Brain tumor ([place])    sees [facility] q year   Depression    Headache    migraine and tension   Hypothyroidism    Soft tissue mass    left shoulder   Substance abuse ([place])     Past Surgical History:  Procedure Laterality Date   colonscopy     gamma knife radiation 2018 for brain tumor'     hematoma removed from arm Left    MASS EXCISION Left 06/30/2019   Procedure: EXCISION OF SOFT TISSUE  MASS LEFT SHOULDER;  Surgeon: Armandina Gemma, MD;  Location: [place];  Service: General;  Laterality: Left;  LMA    Family Psychiatric History:  Alcoholism and drug abuse on father's side   Family History:  [place]  Hypertension Mother   Hypertension Father   Social History:  Socioeconomic History   Marital status: Single/Divorced      Spouse name: No   Number of children: No   Years of education: GED   Highest education level: Dance movement psychotherapist  Occupational History   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 He has had a gradual decline in his performance status, particularly with his memory, concentration, and anxiety. Over the past few years, his memory rapidly deteriorated and it seems episodic. He has been on disability since 2014 and has not been working since 2011.  Tobacco Use   Smoking status: Every Day      Types: Cigarettes in past   Smokeless tobacco: Vapes only now   Tobacco comments:      quit jan 2020  Vaping Use   Vaping Use: Daily  Substance and Sexual Activity   Alcohol use: Not Currently      Comment: quit oct 2019   Drug use: Yes      Types: Cocaine, IV Heroin [place]      Comment: last used cocaine 2019 per pt MAT Methadone and currently Buprenorphine   Sexual activity: Not on file  Other Topics Concern   Not on file  Social History Narrative   See PMH    Social Determinants of [facility] Physical Activity Answer Date Recorded  On average, how many days per week do you engage in moderate to strenuous exercise (like walking fast, running, jogging, dancing, swimming, biking, or other activities that cause a light or heavy sweat)? Not asked    On average, how many minutes do you engage in exercise at this level? 0 min 07/10/2020   Stress Answer Date Recorded  Do you feel stress -  tense, restless, nervous, or anxious, or unable to sleep at night because your mind is troubled all the time - these days? Not at all 07/10/2020   Financial Resource Strain Answer Date Recorded  How hard is it for you to pay for the very basics like food, housing, medical care, and heating? Not hard at all 07/10/2020   Intimate Partner Violence Answer Date Recorded  Within the last year, have you been afraid of your partner or ex-partner? No 07/10/2020  Within the last year, have you been humiliated or emotionally abused in other ways by your partner or ex-partner? No 07/10/2020  Within the last year, have you been kicked, hit, slapped, or otherwise physically hurt by your partner or ex-partner? No 07/10/2020  Within the last year, have you been raped or forced to have any kind of sexual activity by your partner or ex-partner? No 07/10/2020   Food Insecurity Answer Date Recorded  Within the past 12 months, you worried that your food would run out before you got money to buy more. Never true 07/10/2020  Within the past 12 months, the food you bought just didn't last and you didn't have money to get more. Never true 07/10/2020   Transportation Needs Answer Date Recorded  In the past 12 months, has lack of transportation kept you from medical appointments or from getting medications? No 07/10/2020  In the past 12 months, has lack of transportation kept you from meetings, work, or getting things needed for daily living? No 07/10/2020   Housing Stability Answer Date Recorded   In the last 12 months, was there a time when you were not able to pay the mortgage or rent on time? No 07/10/2020  In the last 12 months, how many places have you lived? 1 07/10/2020  In the last 12 months, was there a time when you did not have a steady place to sleep or slept in a shelter (including now)? No 07/10/2020         Allergies:  Allergies  Allergen Reactions   Naloxone Palpitations    Pt report MAT Rx is Subutex/Buprenorphine only   Amphetamine-Dextroamphetamine Other (See Comments)    Metabolic Disorder Labs: Lab Results  Component Value Date   HGBA1C 5.0 10/03/2021   MPG 96.8 10/03/2021   No results found for: "PROLACTIN" Lab Results  Component Value Date   CHOL 191 10/03/2021   TRIG 92 10/03/2021   HDL 48 10/03/2021   CHOLHDL 4.0 10/03/2021   VLDL 18 10/03/2021   LDLCALC 125 (H) 10/03/2021   Lab Results  Component Value Date   TSH 2.166 01/09/2018   TSH 1.459 08/03/2017    Therapeutic Level Labs: No results found for: "LITHIUM" No results found for: "VALPROATE" No results found for: "CBMZ"  Current Medications: Current Outpatient Medications  Medication Sig Dispense Refill   Buprenorphine HCl-Naloxone HCl 8-2 MG FILM Place under the tongue.     diphenhydrAMINE (BENADRYL) 50 MG tablet Take 2-4 tablets q6h as needed for restlessness related to Haldol 100 tablet 2   haloperidol (HALDOL) 10 MG tablet Take 0.5 tablets (5 mg total) by mouth 2 (two) times daily. 60 tablet 0   modafinil (PROVIGIL) 200 MG tablet Take 3 tablets (600 mg total) by mouth daily. 90 tablet 0   nicotine (NICODERM CQ - DOSED IN MG/24 HOURS) 21 mg/24hr patch Place 1 patch (21 mg total) onto the skin daily. 30 patch 0   polyethylene glycol (MIRALAX / GLYCOLAX) 17 g packet Take 17 g  by mouth daily. 30 each 0   propranolol (INDERAL) 10 MG tablet Take 1 tablet (10 mg total) by mouth 3 (three) times daily. 90 tablet 0   tamsulosin (FLOMAX) 0.4 MG CAPS capsule Take 2 capsules (0.8 mg  total) by mouth daily after supper. 60 capsule 0   thyroid (ARMOUR THYROID) 90 MG tablet Take 1 tablet (90 mg total) by mouth daily. 30 tablet 0   traZODone (DESYREL) 100 MG tablet Take 1 tablet (100 mg total) by mouth at bedtime. 30 tablet 0   valbenazine (INGREZZA) 40 MG capsule Take 1 capsule (40 mg total) by mouth daily. 30 capsule 0   No current facility-administered medications for this visit.     Musculoskeletal: Strength & Muscle Tone: within normal limits Gait & Station: normal Patient leans: N/A  Psychiatric Specialty Exam: Review of Systems  Constitutional:  Positive for activity change and fatigue (Idiopathic daytime sleepiness). Negative for appetite change, chills, diaphoresis, fever and unexpected weight change.  HENT: Negative.    Respiratory:  Negative for apnea, cough, choking, chest tightness, shortness of breath, wheezing and stridor.   Cardiovascular:  Negative for chest pain, palpitations and leg swelling.  Gastrointestinal: Negative.   Endocrine: Negative for cold intolerance, heat intolerance, polydipsia, polyphagia and polyuria.  Genitourinary: Negative.        Hx of BPH no complaints  Musculoskeletal: Negative.   Skin:  Positive for color change. Negative for pallor, rash and wound.       Rosacea some redness today  Allergic/Immunologic: Negative.  Negative for immunocompromised state.  Neurological:  Positive for tremors (Decreased from last visit). Negative for dizziness, seizures, syncope, facial asymmetry, speech difficulty, weakness, light-headedness, numbness and headaches.  Hematological:  Negative for adenopathy. Does not bruise/bleed easily.  Psychiatric/Behavioral:  Positive for sleep disturbance. Negative for agitation, behavioral problems, confusion, decreased concentration, dysphoric mood, hallucinations, self-injury and suicidal ideas. The patient is nervous/anxious. The patient is not hyperactive.        Hypersomnia Idiopathic daytime sleepiness     There were no vitals taken for this visit.There is no height or weight on file to calculate BMI.  General Appearance: Casual and Well Groomed  Eye Contact:  Good  Speech:  Clear and Coherent and Normal Rate  Volume:  Normal  Mood:  At times Anxious and Euthymic  Affect:  Appropriate and Congruent  Thought Process:  Coherent, Goal Directed, and Descriptions of Associations: Intact  Orientation:  Full (Time, Place, and Person)  Thought Content: WDL and Logical   Suicidal Thoughts:  No  Homicidal Thoughts:  No  Memory:  Negative  Judgement:  Other:  Improved  Insight:  Jone Baseman he has some resistance to acceo pting strokes and sequellae-has long held belief symptoms were from brain tumor  Psychomotor Activity:  EPS and Minimal on Benadryl and rediuv ced dosage of Haldol   Concentration:  Concentration: Good and Attention Span: Good  Recall:  [place] of Knowledge:  WDL  Language: Good  Akathisia:  Negative  Handed:  Right  AIMS (if indicated): done  Assets:  Communication Skills Desire for Improvement Financial Resources/Insurance Housing Resilience Social Support Talents/Skills Transportation Vocational/Educational  ADL's:  Intact  Cognition: WNL  Sleep:   Pt hasd idiopathic daytime sleepiness feel this is related to his stroke history   Screenings: [place] Office Visit from 10/23/2021 in [facility] Visit from 10/16/2021 in [facility] [facility] Admission (Discharged) from 10/02/2021 in [place]  [facility]  AIMS Total Score 5 21 0      Flowsheet Row Admission (Discharged) from 10/02/2021 in [place] [facility] ED from 09/27/2021 in [place] DEPT  C-SSRS RISK CATEGORY High Risk High Risk        Assessment  Marked improvement in psychosis. Marked improvement in TD with Haldol dosage reduction  and Benadryl-pending Ingrezza rx Some improvement in sleep Needs FU MRI not done due to Hospitalization and par tient c/o feeling tumor is growing Needs to get ordered Thyroid panel  Need to get gene study  And Plan:  Get ordered Thyroid panel -[facility] Need to get gene study Order FU MRI Increase Provigil to max 600 mg         Clinical Trial J Clin Pharmacol . 1999 Jan;39(1):30-40. doi: 10.1177/00912709922007534. A double-blind, placebo-controlled, ascending-dose evaluation of the pharmacokinetics and tolerability of modafinil tablets in healthy male volunteers Y N Wong 1, D [place], L [place], W B Laughton, S P [facility] [employer] expand PMID: [id number] DOI: 10.1177/00912709922007534 Abstract A randomized, double-blind, placebo-controlled, ascending-dose study was conducted to evaluate the pharmacokinetic and safety profiles of increasing modafinil doses (200 mg, 400 mg, 600 mg, 800 mg) administered orally over a 7-day period in normal healthy male volunteers. Eight subjects (six modafinil; two placebo) were randomized to each of the four dose groups. Modafinil or a placebo was administered once daily for 7 days. Serial blood samples were obtained following administration of the day 1 and day 7 doses for characterization of pharmacokinetics, and trough samples were obtained prior to dosing on days 2 through 6 to assess the time to reach the steady state. Pharmacokinetic parameters were calculated using noncompartmental methods. Modafinil steady state was reached after three daily doses. Modafinil pharmacokinetics were dose and time independent over the range of 200 mg to 800 mg. Steady-state pharmacokinetics of modafinil were characterized by a rapid oral absorption rate, a low plasma clearance of approximately 50 mL/min, a volume of distribution of approximately 0.8 L/kg, and a long half-life of approximately 15 hr. Modafinil was primarily eliminated by metabolism.  Modafinil acid was the major urinary metabolite. Stereospecific pharmacokinetics of modafinil were demonstrated. The d-modafinil enantiomer was eliminated at a threefold faster rate than 1-modafinil. Modafinil 200 mg, 400 mg, and 600 mg doses were generally well tolerated. The modafinil 800 mg dose panel was discontinued after 3 days of treatment due to the observation of increased blood pressure and pulse rate. The safety data from this study suggest that the maximum tolerable single daily oral modafinil dose, without titration, may be 600 mg.  FU 1 week  [name], PA-C 10/23/2021, 4:07 PM

## 2021-10-23 NOTE — Progress Notes (Deleted)
Established Patient Office Visit  Subjective   Patient ID: Paul Bray, male    DOB: December 12, 1965  Age: 56 y.o. MRN: [id number]  No chief complaint on file.   HPI  {History (Optional):23778}  ROS    Objective:     There were no vitals taken for this visit. {Vitals History (Optional):23777}  Physical Exam   No results found for any visits on 10/23/21.  {Labs (Optional):23779}  The ASCVD Risk score (Arnett DK, et al., 2019) failed to calculate for the following reasons:   Unable to determine if patient is Non-Hispanic African American    Assessment & Plan:   Problem List Items Addressed This Visit   None   No follow-ups on file.    [name], [facility] MD/PA/NP OP Progress Note  10/23/2021 3:39 PM Paul Bray  MRN:  [id number]  Chief Complaint: No chief complaint on file.  HPI: *** Visit Diagnosis: No diagnosis found.  Past Psychiatric History: ***  Past Medical History:  Past Medical History:  Diagnosis Date   ADHD    Anxiety    Brain tumor ([place])    balance and occ memory issues and headaches   Brain tumor ([place])    se[facility] year   Depression    Headache    migraine and tension   Hypothyroidism    Soft tissue mass    left shoulder   Substance abuse ([place])     Past Surgical History:  Procedure Laterality Date   colonscopy     gamma knife radiation 2018 for brain tumor'     hematoma removed from arm Left    MASS EXCISION Left 06/30/2019   Procedure: EXCISION OF SOFT TISSUE  MASS LEFT SHOULDER;  Surgeon: Armandina Gemma, MD;  Location: [place];  Service: General;  Laterality: Left;  LMA    Family Psychiatric History: ***  Family History: No family history on file.  Social History:  Social History   Socioeconomic History   Marital status: Single    Spouse name: Not on file   Number of children: Not on file   Years of education: Not on file   Highest education level: Not on file  Occupational  History   Not on file  Tobacco Use   Smoking status: Former    Types: Cigarettes   Smokeless tobacco: Current   Tobacco comments:    quit jan 2020  Vaping Use   Vaping Use: Every day  Substance and Sexual Activity   Alcohol use: Not Currently    Comment: quit Oct 2019   Drug use: Not Currently    Types: Cocaine, IV, Heroin, MDMA (Ecstacy)    Comment: last reported use; cocaine and heroin in 2019; MDMA Feb 2023   Sexual activity: Not Currently  Other Topics Concern   Not on file  Social History Narrative   Not on file   Social Determinants of Health   Financial Resource Strain: Not on file  Food Insecurity: Not on file  Transportation Needs: Not on file  Physical Activity: Not on file  Stress: Not on file  Social Connections: Not on file    Allergies:  Allergies  Allergen Reactions   Naloxone Palpitations    Pt report MAT Rx is Subutex/Buprenorphine only   Amphetamine-Dextroamphetamine Other (See Comments)    Metabolic Disorder Labs: Lab Results  Component Value Date   HGBA1C 5.0 10/03/2021   MPG 96.8 10/03/2021   No results found for: "PROLACTIN"  Lab Results  Component Value Date   CHOL 191 10/03/2021   TRIG 92 10/03/2021   HDL 48 10/03/2021   CHOLHDL 4.0 10/03/2021   VLDL 18 10/03/2021   LDLCALC 125 (H) 10/03/2021   Lab Results  Component Value Date   TSH 2.166 01/09/2018   TSH 1.459 08/03/2017    Therapeutic Level Labs: No results found for: "LITHIUM" No results found for: "VALPROATE" No results found for: "CBMZ"  Current Medications: Current Outpatient Medications  Medication Sig Dispense Refill   Buprenorphine HCl-Naloxone HCl 8-2 MG FILM Place under the tongue.     diphenhydrAMINE (BENADRYL) 50 MG tablet Take 2-4 tablets q6h as needed for restlessness related to Haldol 100 tablet 2   haloperidol (HALDOL) 10 MG tablet Take 0.5 tablets (5 mg total) by mouth 2 (two) times daily. 60 tablet 0   modafinil (PROVIGIL) 200 MG tablet Take 3 tablets  (600 mg total) by mouth daily. 90 tablet 0   nicotine (NICODERM CQ - DOSED IN MG/24 HOURS) 21 mg/24hr patch Place 1 patch (21 mg total) onto the skin daily. 30 patch 0   polyethylene glycol (MIRALAX / GLYCOLAX) 17 g packet Take 17 g by mouth daily. 30 each 0   propranolol (INDERAL) 10 MG tablet Take 1 tablet (10 mg total) by mouth 3 (three) times daily. 90 tablet 0   tamsulosin (FLOMAX) 0.4 MG CAPS capsule Take 2 capsules (0.8 mg total) by mouth daily after supper. 60 capsule 0   thyroid (ARMOUR THYROID) 90 MG tablet Take 1 tablet (90 mg total) by mouth daily. 30 tablet 0   traZODone (DESYREL) 100 MG tablet Take 1 tablet (100 mg total) by mouth at bedtime. 30 tablet 0   valbenazine (INGREZZA) 40 MG capsule Take 1 capsule (40 mg total) by mouth daily. 30 capsule 0   No current facility-administered medications for this visit.     Musculoskeletal: Strength & Muscle Tone: {desc; muscle tone:32375} Gait & Station: {PE GAIT ED OMVE:72094} Patient leans: {Patient Leans:21022755}  Psychiatric Specialty Exam: Review of Systems  There were no vitals taken for this visit.There is no height or weight on file to calculate BMI.  General Appearance: {Appearance:22683}  Eye Contact:  {BHH EYE CONTACT:22684}  Speech:  {Speech:22685}  Volume:  {Volume (PAA):22686}  Mood:  {BHH MOOD:22306}  Affect:  {Affect (PAA):22687}  Thought Process:  {Thought Process (PAA):22688}  Orientation:  {BHH ORIENTATION (PAA):22689}  Thought Content: {Thought Content:22690}   Suicidal Thoughts:  {ST/HT (PAA):22692}  Homicidal Thoughts:  {ST/HT (PAA):22692}  Memory:  {BHH MEMORY:22881}  Judgement:  {Judgement (PAA):22694}  Insight:  {Insight (PAA):22695}  Psychomotor Activity:  {Psychomotor (PAA):22696}  Concentration:  {Concentration:21399}  Recall:  {BHH GOOD/FAIR/POOR:22877}  Fund of Knowledge: {BHH GOOD/FAIR/POOR:22877}  Language: {BHH GOOD/FAIR/POOR:22877}  Akathisia:  {BHH YES OR NO:22294}  Handed:   {Handed:22697}  AIMS (if indicated): {Desc; done/not:10129}  Assets:  {Assets (PAA):22698}  ADL's:  {BHH BSJ'G:28366}  Cognition: {chl bhh cognition:304700322}  Sleep:  {BHH GOOD/FAIR/POOR:22877}   Screenings: [place] Office Visit from 10/16/2021 in [facility] Admission (Discharged) from 10/02/2021 in [place] 400B  AIMS Total Score 21 0      Flowsheet Row Admission (Discharged) from 10/02/2021 in [place] 400B ED from 09/27/2021 in [place] DEPT  C-SSRS RISK CATEGORY High Risk High Risk        Assessment and Plan: ***  Collaboration of Care: Collaboration of Care: {BH OP  Collaboration of EUMP:53614431}  Patient/Guardian was advised Release of Information must be obtained prior to any record release in order to collaborate their care with an outside provider. Patient/Guardian was advised if they have not already done so to contact the registration department to sign all necessary forms in order for [place] to release information regarding their care.   Consent: Patient/Guardian gives verbal consent for treatment and assignment of benefits for services provided during this visit. Patient/Guardian expressed understanding and agreed to proceed.    [name], PA-C 10/23/2021, 3:39 PM

## 2021-10-29 ENCOUNTER — Telehealth (HOSPITAL_COMMUNITY): Payer: Self-pay | Admitting: Medical

## 2021-10-29 NOTE — Telephone Encounter (Signed)
Mom called to query Paul Bray's c/o "cant get out of bed" as Modafinil would not be filled until Friday.He cancelled his visit tomorrow.She also said Insurance would only cover 400 mg/day.  She observed that he qwas able to be up and around in Hospital and wondered if this was "psychological" [name] that swocial setting would explain his response in hospital. At home he isolateas and has no social outlets/activities.  He cannot get Group Home with [place] have Medicaid ?if he can qualify now-she will follow Also suggested she contact Medicare to see if he can get Case Manager ? Any daytime social activities he could join in thru local organizations? She says he/they will come next week Pharmacy is going to blister pack his meds. They have asked her to have his Rx sent in early for this purpose.

## 2021-10-30 ENCOUNTER — Telehealth (HOSPITAL_COMMUNITY): Payer: Self-pay | Admitting: *Deleted

## 2021-10-30 ENCOUNTER — Ambulatory Visit (HOSPITAL_COMMUNITY): Payer: Medicare Other | Admitting: Medical

## 2021-10-30 ENCOUNTER — Other Ambulatory Visit (HOSPITAL_COMMUNITY): Payer: Self-pay | Admitting: *Deleted

## 2021-10-30 DIAGNOSIS — C719 Malignant neoplasm of brain, unspecified: Secondary | ICD-10-CM

## 2021-10-30 DIAGNOSIS — I639 Cerebral infarction, unspecified: Secondary | ICD-10-CM

## 2021-10-30 DIAGNOSIS — Z923 Personal history of irradiation: Secondary | ICD-10-CM

## 2021-10-30 NOTE — Telephone Encounter (Signed)
Referral for MRI w/wo contrast sent to [facility].

## 2021-11-04 NOTE — Telephone Encounter (Signed)
I did note that

## 2021-11-06 ENCOUNTER — Ambulatory Visit (HOSPITAL_BASED_OUTPATIENT_CLINIC_OR_DEPARTMENT_OTHER): Payer: Medicare Other | Admitting: Medical

## 2021-11-06 VITALS — BP 118/84 | HR 84 | Temp 97.8°F | Ht 70.0 in | Wt 190.0 lb

## 2021-11-06 DIAGNOSIS — E039 Hypothyroidism, unspecified: Secondary | ICD-10-CM

## 2021-11-06 DIAGNOSIS — I10 Essential (primary) hypertension: Secondary | ICD-10-CM

## 2021-11-06 DIAGNOSIS — F060 Psychotic disorder with hallucinations due to known physiological condition: Secondary | ICD-10-CM

## 2021-11-06 DIAGNOSIS — G4711 Idiopathic hypersomnia with long sleep time: Secondary | ICD-10-CM

## 2021-11-06 DIAGNOSIS — F1321 Sedative, hypnotic or anxiolytic dependence, in remission: Secondary | ICD-10-CM

## 2021-11-06 DIAGNOSIS — C719 Malignant neoplasm of brain, unspecified: Secondary | ICD-10-CM

## 2021-11-06 DIAGNOSIS — Z8659 Personal history of other mental and behavioral disorders: Secondary | ICD-10-CM

## 2021-11-06 DIAGNOSIS — I639 Cerebral infarction, unspecified: Secondary | ICD-10-CM | POA: Diagnosis not present

## 2021-11-06 DIAGNOSIS — F1120 Opioid dependence, uncomplicated: Secondary | ICD-10-CM

## 2021-11-06 DIAGNOSIS — G2401 Drug induced subacute dyskinesia: Secondary | ICD-10-CM

## 2021-11-06 DIAGNOSIS — I6381 Other cerebral infarction due to occlusion or stenosis of small artery: Secondary | ICD-10-CM | POA: Diagnosis not present

## 2021-11-06 DIAGNOSIS — Z09 Encounter for follow-up examination after completed treatment for conditions other than malignant neoplasm: Secondary | ICD-10-CM

## 2021-11-06 DIAGNOSIS — Z923 Personal history of irradiation: Secondary | ICD-10-CM

## 2021-11-06 DIAGNOSIS — G250 Essential tremor: Secondary | ICD-10-CM

## 2021-11-06 DIAGNOSIS — F1921 Other psychoactive substance dependence, in remission: Secondary | ICD-10-CM

## 2021-11-06 NOTE — Progress Notes (Signed)
[facility] MD/PA/NP OP Progress Note   11/06/2021 3:43 PM DAKAI Bray  MRN:  [id number]  Chief Complaint:  Chief Complaint  Patient presents with   [facility] discharge FU   Addiction Problem   Altered Mental Status   Anxiety   Hallucinations   Medication Problem   Stress   Family Problem   HPI: Paul Bray returns for scheduled FU after missing his previous appointment due to hospitalzation 7/5-11/01/2021 for overdosing/overtaking his Ingrezza. He claims having uncontrollable movements and that he kept taking the Ingrezza to stop them. He has 4 pills left of a 30 day rx filled  10/16/2021. Below is hospital record: Details Date Type Department Care Team Description  11/03/2021 Patient [place], [place] [id number]   [place], [place], [place]   [place], [place] [address]   [phone] (Work)   Transitional Care Management  Date of recent admission: 10/29/21 Date of recent discharge: 11/01/21 Initial TCM Call Date: 11/03/21  Telephone contact made with: patient A follow up office visit scheduled with: [name]  Date: 7/19 Time: 11am  Disease Assessment: Patient outreach completed with the patient. He reports he was "chewing his tongue" before his hospital visit--but he is better now. He denies any suicidal thoughts or homicidal thoughts. He denies any visual or auditory hallucinations. He reports he is still having some tremors. I reviewed most of his medications with him--but he declined to review all his medications with me at this time. He reports he lives with his mother and has family support. He reports his mother can drive him to the doctor. I sent him a list of local food pantries. He denies any recent falls. He didn't have a follow up visit set up with PCP--so I made him an appt. He reports he already has a follow up visit set up with psych. He denies any other needs right now.   [facility] Hospitalist Discharge Summary Hospital Course:  56 y.o. male with past medical history including substance use disorder, hypothyroidism, bipolar disorder, schizophrenia, stroke and intraventricular tumor admitted to [facility] on 10/30/21 for tremors.   Patient unable to provide history. History obtained from ED provider and chart review. Patient was brought into the ED for medication overdose on Ingrezza. He was recently admitted to behavioral health unit at [facility] and discharged on 10/13/21. He was started on Haldol during that hospitalization and developed extrapyramidal symptoms, he was then started on Ingrezza. He has taken all but 3 days of his 1 month supply of Ingrezza. Patient stated to ED provider that he was taking extra doses to help with his extrapyramidal symptoms. He also verbalized to ED provider Dr. [name] that he was not attempting suicide. He also took multiple doses of Benadryl in addition to [employer]. ED provider spoke with poison control who recommended 6-hour observation, they also discussed the case with poison control regarding control of his EPS. They recommend primarily using benzodiazepine and if this is ineffective, trying a low-dose benztropine.   On arrival to the ED patient was afebrile with heart rate 88, respirations 16, BP 120/76, SPO2 97%. CBC with WBC 3.9, hemoglobin 14.0, plts 162. CMP unremarkable. Ethanol less than 10. APAP less than 10. Salicylate less than 2.5. UDS negative. Fentanyl negative. UA with protein but negative for infection. EKG: sinus at 83, no significant change with prior EKG.  Patient received activated charcoal and multiple doses of Ativan in the ED. Admitted to the hospitalist service for further management. We have been taking care of following issues,  Unintentional overdose on Ingrezza  Extrapyramidal symptoms Tardive dyskinesia  - patient presented to the ED after unintentional overdose on  Ingrezza in an attempt to stop involuntary shaking/tremors  - neurology  and psychiatry has been consulted. Continue management as recommended by psychiatry. Personally discussed with the psychiatry. CT head negative for acute intracranial pathology. Psychiatry has adjusted psych medications and recommended to continue current regimen. I have sent prescription for patient to the preferred pharmacy and discussed with the patient's mom on the phone about patient's care, verbalized understanding. Patient is to follow-up with the primary psychiatry outpatient in 1 to 2 weeks.   Schizophrenia - psychiatry following and managing medications, they recommend resuming propranolol, haldol at lower dose, Atarax and synthroid    Hypothyroidism -TSH level is suppressed and free T3 level is suppressed. Patient's mom reports that new thyroid supplement dose has been adjusted recently about 4 weeks ago. I will continue the current dose of thyroid supplement and request PCP to repeat thyroid function test in 4 to 6 weeks and adjust the dose of supplement accordingly.  Results - (ABNORMAL) TSH, 3rd Generation (10/29/2021 8:03 PM EDT) Component Value Ref Range Test Method Analysis Time Performed At Pathologist Signature  TSH 0.21 (L) 0.45 - 5.33 UIU/ML   10/30/2021 4:12 PM EDT [facility]     FREE T3         2.18 Low                           2.30 - 4.20 pg/mL 2     Urinary retention - bladder scan with 814 ml, unable to place catheter in ED - urology consulted by ED, recommendations appreciated - patient ultimately able to urinate 550 cc on his own - UA negative for infection - bladder management protocol  -Resumed Flomax. Patient is voiding well.  Patient seems to be stable for the discharge. Patient has a high potential for readmission due to multiple medical problems.  Post Discharge Follow Up Issues:  Patient is to follow with PCP in 1 to 2 weeks. Patient is to arrange appointment. Patient  is to follow-up with primary psychiatry in 1 to 2 weeks and needs adjustment of psych medications accordingly. Plan of care was discussed with the mom on the phone.  Procedures: None No admission procedures for hospital encounter. _____________________________________________________________________________ Discharge Day Services: BP 118/84  Pulse 84  Temp 97.8 F (36.6 C) (Oral)  Resp 20  Ht 1.778 m ('5\' 10"'$ )  Wt 86.2 kg (190 lb)  SpO2 98%  BMI 27.26 kg/m  Pt seen on the day of discharge and determined appropriate for discharge. GEN: NAD, lying in bed EYES: EOMI ENT: MMM CV: RRR Condition at Discharge: fair Medications at Time of Discharge - documented as of this encounter Medications at Time of Discharge Medication Sig Dispensed Refills Start Date End Date  buprenorphine-naloxone (SUBOXONE) 8-2 mg SL film   Place 1 Film under the tongue 3 times daily.   0 10/14/2021    crisaborole (EUCRISA) 2 % ointment   Indications: Rash and nonspecific skin eruption Apply topically 2 times daily. 60 g   1 12/23/2020    ergocalciferol (VITAMIN D2) 1,250 mcg (50,000 unit) capsule   Indications: Vitamin D deficiency Take 1 capsule (50,000 Units total) by  mouth twice a week. 10 capsule   2 07/11/2020    fluocinolone acetonide (DERMOTIC OIL) 0.01 % ear drops   Indications: Eczema of external ear, bilateral Apply 3 to 5 drops to affected area of ears once to twice daily as needed. 20 mL   2 03/27/2021    haloperidoL (HALDOL) 5 MG tablet   Take 1 tablet (5 mg total) by mouth 2 times daily for 30 days. 60 tablet   0 11/01/2021 12/01/2021  hydrocortisone 2.5 % lotion   Apply 1 application. topically 2 (two) times daily as needed. 5 mL   0 08/08/2021 08/08/2022  hydrOXYzine HCL (ATARAX) 25 MG tablet   Indications: anxiety Take 1 tablet (25 mg total) by mouth 3 (three) times daily as needed for Itching. 30 tablet   0 11/01/2021    meloxicam (MOBIC) 15 MG tablet       0 01/16/2021    metroNIDAZOLE  (METROGEL) 0.75 % gel   Indications: Rosacea Apply twice a day to the cheeks 45 g   11 07/07/2021    nicotine (NICODERM CQ) 21 mg/24 hr   Place 21 mg onto the skin daily.   0 10/13/2021 11/12/2021  NP THYROID 90 mg tablet   Take 1 tablet (90 mg total) by mouth daily.   0 10/13/2021    olopatadine (PATADAY) 0.1 % ophthalmic solution   Indications: Conjunctivitis of both eyes, unspecified conjunctivitis type Place 1 drop into both eyes 2 times daily. 5 mL   1 02/18/2021    polyethylene glycol (GLYCOLAX, MIRALAX) 17 gram/dose powder   Take 17 g by mouth daily.   0 10/14/2021    propranoloL (INDERAL) 20 MG tablet   TAKE ONE TABLET BY MOUTH THREE TIMES A DAY AS NEEDED 270 tablet   0 11/03/2021    tamsulosin (FLOMAX) 0.4 mg Cap capsule   Take 2 capsules (0.8 mg total) by mouth daily after dinner.   0 10/13/2021    tobramycin-dexAMETHasone (TOBRADEX) 0.3-0.1 % ophthalmic suspension       0 02/19/2021    white petrolatum (AQUAPHOR) 41 % ointment   Apply 0.5 inches topically as needed. 20 g   0 05/04/2020    propranoloL (INDERAL) 20 MG tablet   TAKE ONE TABLET BY MOUTH THREE TIMES A DAY AS NEEDED 270 tablet   0 04/29/2021 11/03/2021   Ordered Prescriptions - documented in this encounter Ordered Prescriptions Prescription Sig Dispensed Refills Start Date End Date  hydrOXYzine HCL (ATARAX) 25 MG tablet   Indications: anxiety Take 1 tablet (25 mg total) by mouth 3 (three) times daily as needed for Itching. 30 tablet   0 11/01/2021    haloperidoL (HALDOL) 5 MG tablet   Take 1 tablet (5 mg total) by mouth 2 times daily for 30 days. 60 tablet   0 11/01/2021 12/01/2021   Discharge Disposition - documented in this encounter Discharge Disposition Disposition Code Departure Means Destination Comments  Home or [place] he remarks that his tongue chewing has abated. He is able to manage this by chewing gum if he has to. He is not sure about the thyroid studies done at hospital and said  Doctor was not eirther based on his preasentation. He says he took his thyroid rx as prescribed but did not get FU lab as ordered. He has had no recurrence of his hallucinosis and /or paranoia to date. He dose have EPS to Haldol. Hospital D/Cd his  Modanafil but he is not willing saying it gives him quality of life around his hypersomnia.Healso says he purchased the addional dose his insurance would not cover so he is taking 600 mg prescribed and has stopped overtaking so far.He did not experience any withdrawal from his suboxone Rx and says he diidnt notice not receiving o it for the 3 days he did not get it.  Mother is with him today as well and participates in early part of visit. She says he did not get his SDuboxone for 3 days despiy te her telling staff he was to receive it. Says at one point she acused therm of cruelty. She also reports that hospital would not discharge him without someone to supervise him so she has had to move [name] back home with the ptoviso his bedroom is only to be used as bedroom. His other activities ie guitar/music and computer must be done outside bedroom to keep it clean and orderly. She has contacted [facility] so [name]'s medications will come in dose packs in future.Paul Bray has committed to taking his meds as prescribed.   Visit Diagnosis:    ICD-10-CM   1. Hospital discharge follow-up  Z09     2. Multiple lacunar infarcts (HCC)  I63.81     3. Infarction of left basal ganglia (HCC)  I63.9     4. 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 Status post gamma knife treatment  Z92.3     6. Polysubstance dependence in early, early partial, sustained full, or sustained partial remission ([place])  F19.21     7. Opioid dependence on maintenance agonist therapy, no symptoms (HCC)  F11.20     8. Sedative, hypnotic or anxiolytic use disorder, moderate, in sustained remission (HCC)  F13.21     9. Idiopathic hypersomnia with long sleep time  G47.11     10. 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 Accelerated hypertension  I10     12. Acquired hypothyroidism  E03.9     13. Familial tremor  G25.0     14. Tardive dyskinesia  G24.01     15. History of ADHD  Z86.59       Past Psychiatric History:     [facility]  [facility] [place] 6/17/-03/04/2020  Pt with Opiate dependence MAT Suboxone at [facility] Dr Lynnda Shields C/O of paranoia he is aware of but cant stop/completely control.Has rx for Risperdal from [employer]. Also hx of Gamma Knife tradiation fo benign brain tumor ?2018 Says has records Requesting Psychiatrist/medication mangement for paranoia hx [place] [facility] [place] 08/21/2021 - NOW [facility] INPATIENT  Date of Admission:  10/02/2021 Date of Discharge: 10/13/2021   [facility] Alcohol intoxication 03/26/2017  Alcohol withdrawal delirium 03/26/2017  Chronic daily headache 02/25/2017  Alcohol use disorder, severe, dependence 02/22/2017  Heroin dependence MAT Methadone [place]/Subutex TBR 01/2018  Anxiety disorder, unspecified 01/20/2017  Paranoia 03/2018    [facility] Unk Pinto, NP - 04/12/2017 12:38 PM EST Formatting of this note might be different from the original. [facility] Psychiatry - Substance Abuse PHP/IOP Intake Assessment  BH Formulate an individual and appropriate relapse prevention plan.   [place] SU Problem: Formation of a relapse prevention plan    [facility] Uncomplicated opioid dependence (CMS/HCC) 12/24/2017  1. Discharge from hospital. 2. Follow up at [facility]  [facility] Records requested-never     received             12/15/2007 -05/21/2010     Past Medical History:  Past Medical History:  Diagnosis Date   ADHD    Anxiety    Brain tumor ([place])    balance and occ memory issues and headaches   Brain tumor ([place])    sees [facility] q year   Depression    Headache     migraine and tension   Hypothyroidism    Soft tissue mass    left shoulder   Substance abuse ([place])     Past Surgical History:  Procedure Laterality Date   colonscopy     gamma knife radiation 2018 for brain tumor'     hematoma removed from arm Left    MASS EXCISION Left 06/30/2019   Procedure: EXCISION OF SOFT TISSUE  MASS LEFT SHOULDER;  Surgeon: Armandina Gemma, MD;  Location: [place];  Service: General;  Laterality: Left;  LMA    Family Psychiatric History: Alcoholism and drug abuse on father's side    Family History:  [place]  Hypertension Mother   Hypertension Father    Social History:  Social History   Socioeconomic History   Marital status: Single    Spouse name: Not on file   Number of children: Not on file   Years of education: Not on file   Highest education level: Not on file  Occupational History   Not on file  Tobacco Use   Smoking status: Former    Types: Cigarettes   Smokeless tobacco: Current   Tobacco comments:    quit jan 2020  Vaping Use   Vaping Use: Every day  Substance and Sexual Activity   Alcohol use: Not Currently    Comment: quit Oct 2019   Drug use: Not Currently    Types: Cocaine, IV, Heroin, MDMA (Ecstacy)    Comment: last reported use; cocaine and heroin in 2019; MDMA Feb 2023   Sexual activity: Not Currently  Other Topics Concern   Not on file  Social History Narrative      Social Determinants of Health   Financial Resource Strain: Not on file  Food Insecurity: Not on file  Transportation Needs: Not on file  Physical Activity: Not on file  Stress: Not on file  Social Connections: Not on file    Allergies:  Allergies  Allergen Reactions   Naloxone Palpitations    Pt report MAT Rx is Subutex/Buprenorphine only   Amphetamine-Dextroamphetamine Other (See Comments)    Metabolic Disorder Labs: Lab Results  Component Value Date   HGBA1C 5.0 10/03/2021   MPG 96.8 10/03/2021   No results found for:  "PROLACTIN" Lab Results  Component Value Date   CHOL 191 10/03/2021   TRIG 92 10/03/2021   HDL 48 10/03/2021   CHOLHDL 4.0 10/03/2021   VLDL 18 10/03/2021   LDLCALC 125 (H) 10/03/2021   Lab Results  Component Value Date   TSH 2.166 01/09/2018   TSH 1.459 08/03/2017    Therapeutic Level Labs: No results found for: "LITHIUM" No results found for: "VALPROATE" No results found for: "CBMZ"  Current Medications: Current Outpatient Medications  Medication Sig Dispense Refill   Buprenorphine HCl-Naloxone HCl 8-2 MG FILM Place under the tongue.     diphenhydrAMINE (BENADRYL) 50 MG tablet Take 2-4 tablets q6h as needed for restlessness related to Haldol 100 tablet 2   haloperidol (HALDOL) 5 MG tablet  Take 5 mg by mouth 2 (two) times daily.     hydrOXYzine (ATARAX) 25 MG tablet Take 25 mg by mouth 3 (three) times daily.     modafinil (PROVIGIL) 200 MG tablet Take 3 tablets (600 mg total) by mouth daily. 90 tablet 0   nicotine (NICODERM CQ - DOSED IN MG/24 HOURS) 21 mg/24hr patch Place 1 patch (21 mg total) onto the skin daily. 30 patch 0   polyethylene glycol (MIRALAX / GLYCOLAX) 17 g packet Take 17 g by mouth daily. 30 each 0   propranolol (INDERAL) 20 MG tablet Take 20 mg by mouth 3 (three) times daily.     tamsulosin (FLOMAX) 0.4 MG CAPS capsule Take 2 capsules (0.8 mg total) by mouth daily after supper. 60 capsule 0   thyroid (ARMOUR THYROID) 90 MG tablet Take 1 tablet (90 mg total) by mouth daily. 30 tablet 0   traZODone (DESYREL) 100 MG tablet Take 1 tablet (100 mg total) by mouth at bedtime. 30 tablet 0   valbenazine (INGREZZA) 40 MG capsule Take 1 capsule (40 mg total) by mouth daily. 30 capsule 0   No current facility-administered medications for this visit.     Musculoskeletal: Strength & Muscle Tone:  Familial tremor Gait & Station: normal Patient leans: N/A  Psychiatric Specialty Exam: Review of Systems  Constitutional:  Positive for activity change and fatigue  (Chronic). Negative for appetite change, chills, diaphoresis, fever and unexpected weight change.  HENT:  Negative for congestion, dental problem, ear pain, facial swelling, hearing loss, mouth sores, nosebleeds, postnasal drip, rhinorrhea, sinus pressure, sinus pain, sneezing, sore throat, tinnitus, trouble swallowing and voice change.        Stopped chewing tongue  Eyes:  Negative for photophobia, pain, discharge, redness, itching and visual disturbance.  Respiratory:  Negative for apnea, cough, choking, chest tightness, shortness of breath, wheezing and stridor.   Cardiovascular:  Positive for leg swelling. Negative for chest pain and palpitations.  Gastrointestinal:  Negative for abdominal distention, abdominal pain, anal bleeding, blood in stool, constipation, diarrhea, nausea, rectal pain and vomiting.  Endocrine: Negative for cold intolerance, heat intolerance, polydipsia, polyphagia and polyuria.  Genitourinary:  Positive for difficulty urinating (BPH). Negative for decreased urine volume, dysuria, enuresis, flank pain, frequency, genital sores, hematuria, penile discharge, penile pain, penile swelling, scrotal swelling, testicular pain and urgency.  Musculoskeletal:  Negative for arthralgias, back pain, gait problem, joint swelling, myalgias, neck pain and neck stiffness.       Familial tremor + AIMS  Skin:  Positive for rash (Rosacea Rx meds). Negative for color change, pallor and wound.  Allergic/Immunologic: Negative for environmental allergies, food allergies and immunocompromised state.  Neurological:  Positive for tremors. Negative for dizziness, seizures, syncope, facial asymmetry, speech difficulty, weakness, light-headedness, numbness and headaches.       C/o occasional balance and memory issues he relates to tumor but he was unaware of strokes  Hematological:  Negative for adenopathy. Does not bruise/bleed easily.  Psychiatric/Behavioral:  Positive for behavioral problems  (Continues to abuse Rx meds), dysphoric mood and sleep disturbance (Hypersomnia rx Modanafil). Negative for agitation, confusion, decreased concentration, hallucinations, self-injury and suicidal ideas. The patient is nervous/anxious. The patient is not hyperactive.     Blood pressure 118/84, pulse 84, temperature 97.8 F (36.6 C), height '5\' 10"'$  (1.778 m), weight 190 lb (86.2 kg).Body mass index is 27.26 kg/m.  General Appearance: Casual  Eye Contact:  Good  Speech:  Clear and Coherent  Volume:  Normal  Mood:  Mostly euthymic-occasiona irritability/defensiveness  Affect:  Appropriate and Congruent  Thought Process:  Coherent, Goal Directed, and Descriptions of Associations: Intact  Orientation:  Full (Time, Place, and Person)  Thought Content: WDL and Obsessions   Suicidal Thoughts:  No  Homicidal Thoughts:  No  Memory:   Has periods of blackouts related to drugs and overtaking medications  Judgement:  Impaired  Insight:   Limited  Psychomotor Activity:   Tremor  Concentration:  Concentration: Good and Attention Span: Good  Recall:   See memory  Fund of Knowledge:  WDL  Language: Good  Akathisia:  No  Handed:  Right  AIMS (if indicated): done  Assets:  Desire for Improvement Financial Resources/Insurance Housing Resilience Social Support Talents/Skills Transportation Vocational/Educational  ADL's:  Intact  Cognition: Impaired,  Moderate  Sleep:   Hypersomnia   Screenings: [place] Office Visit from 10/23/2021 in [facility] Office Visit from 10/16/2021 in [facility] Admission (Discharged) from 10/02/2021 in [facility]  AIMS Total Score 5 21 0      Flowsheet Row Admission (Discharged) from 10/02/2021 in [facility] ED from 09/27/2021 in [place] DEPT  C-SSRS RISK CATEGORY High Risk  High Risk        Assessment : Continues to to become obsessed with varying aspects of his health most recently tremor.He has had a longtime obsession about his tumor.He resists the idea that his infarctions are the most likely cause of his symptoms. Haldol has been the only antipsychotic to be effective but he is sensitive to EPS effects He still needs previously ordered FU studies not done Thyroid Panel and Enhanced MRI of brain (to address his sense  that tumor is growing-unenhanced studies are of no value at this juncture)   and Plan: Get labs and MRI                   Reinforced NEED TO TAKE MEDS PROPERLY                  Reviewed medications(he brought bottles)                  No meds ordered today-see list for current meds                   FU 1 week  [name], PA-C 11/06/2021 3:43 PM

## 2021-11-07 ENCOUNTER — Other Ambulatory Visit (HOSPITAL_COMMUNITY): Payer: Self-pay | Admitting: *Deleted

## 2021-11-07 DIAGNOSIS — E039 Hypothyroidism, unspecified: Secondary | ICD-10-CM

## 2021-11-10 ENCOUNTER — Encounter (HOSPITAL_COMMUNITY): Payer: Self-pay | Admitting: Medical

## 2021-11-11 LAB — THYROID PANEL WITH TSH
Free Thyroxine Index: 1.1 — ABNORMAL LOW (ref 1.2–4.9)
T3 Uptake Ratio: 26 % (ref 24–39)
T4, Total: 4.1 ug/dL — ABNORMAL LOW (ref 4.5–12.0)
TSH: 0.417 u[IU]/mL — ABNORMAL LOW (ref 0.450–4.500)

## 2021-11-13 ENCOUNTER — Encounter (HOSPITAL_COMMUNITY): Payer: Self-pay | Admitting: Medical

## 2021-11-13 ENCOUNTER — Ambulatory Visit (HOSPITAL_BASED_OUTPATIENT_CLINIC_OR_DEPARTMENT_OTHER): Payer: Medicare Other | Admitting: Medical

## 2021-11-13 DIAGNOSIS — G2401 Drug induced subacute dyskinesia: Secondary | ICD-10-CM

## 2021-11-13 DIAGNOSIS — F1120 Opioid dependence, uncomplicated: Secondary | ICD-10-CM | POA: Diagnosis not present

## 2021-11-13 DIAGNOSIS — C719 Malignant neoplasm of brain, unspecified: Secondary | ICD-10-CM

## 2021-11-13 DIAGNOSIS — F1921 Other psychoactive substance dependence, in remission: Secondary | ICD-10-CM

## 2021-11-13 DIAGNOSIS — G250 Essential tremor: Secondary | ICD-10-CM

## 2021-11-13 DIAGNOSIS — E039 Hypothyroidism, unspecified: Secondary | ICD-10-CM

## 2021-11-13 DIAGNOSIS — F1421 Cocaine dependence, in remission: Secondary | ICD-10-CM

## 2021-11-13 DIAGNOSIS — F1321 Sedative, hypnotic or anxiolytic dependence, in remission: Secondary | ICD-10-CM | POA: Diagnosis not present

## 2021-11-13 DIAGNOSIS — I639 Cerebral infarction, unspecified: Secondary | ICD-10-CM | POA: Diagnosis not present

## 2021-11-13 DIAGNOSIS — G4711 Idiopathic hypersomnia with long sleep time: Secondary | ICD-10-CM

## 2021-11-13 DIAGNOSIS — F060 Psychotic disorder with hallucinations due to known physiological condition: Secondary | ICD-10-CM

## 2021-11-13 DIAGNOSIS — Z923 Personal history of irradiation: Secondary | ICD-10-CM

## 2021-11-13 MED ORDER — GABAPENTIN 400 MG PO CAPS
400.0000 mg | ORAL_CAPSULE | Freq: Two times a day (BID) | ORAL | 0 refills | Status: DC
Start: 2021-11-13 — End: 2022-01-29

## 2021-11-13 MED ORDER — THYROID 113.75 MG PO TABS: 113.7500 mg | ORAL_TABLET | Freq: Every day | ORAL | 0 refills | Status: DC

## 2021-11-13 MED ORDER — VALBENAZINE TOSYLATE 40 MG PO CAPS: 40.0000 mg | ORAL_CAPSULE | Freq: Every day | ORAL | 2 refills | Status: DC

## 2021-11-13 NOTE — Progress Notes (Signed)
BH MD/PA/NP OP Progress Note  11/13/2021 3:43 PM Paul Bray  MRN:  [id number]  Chief Complaint:  Chief Complaint  Patient presents with   Follow-up   Addiction Problem   Post stroke psychosis   Medication Problem   Medication Refill   HPI: Paul Bray returns for FU on his medications adjusted since discharge. He has not had any further self induced medication errors. He is adjusting to living with his mother.His Thyroid studies continue to be abnormal. He continues to have involuntary leg movements. His In Vitae study was unremarkable for any problems with his medications.   Visit Diagnosis:    ICD-10-CM   1. Infarction of left basal ganglia (HCC)  I63.9     2. Polysubstance dependence in early, early partial, sustained full, or sustained partial remission ([place])  F19.21     3. Opioid dependence on maintenance agonist therapy, no symptoms (HCC)  F11.20     4. Sedative, hypnotic or anxiolytic use disorder, moderate, in sustained remission (HCC)  F13.21     5. Cocaine dependence in remission ([place])  F14.21     6. 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 Status post gamma knife treatment  Z92.3     8. 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 Idiopathic hypersomnia with long sleep time  G47.11     10. Familial tremor  G25.0     11. Tardive dyskinesia  G24.01     12. Acquired hypothyroidism  E03.9       Past Psychiatric History:  Paul Bray Date of recent admission: 10/29/21 Date of recent discharge: 11/01/21    [facility] [place] [facility] [place] 6/17/-03/04/2020  Pt with Opiate dependence MAT Suboxone at [facility] Dr Lynnda Shields C/O of paranoia he is aware of but cant stop/completely control.Has rx for Risperdal from [employer]. Also hx of Gamma Knife tradiation fo benign brain tumor ?2018 Says has records Requesting Psychiatrist/medication mangement for paranoia hx [place] [facility] [place] 08/21/2021 -  [facility]  Date of Admission:  10/02/2021 Date of Discharge: 10/13/2021   [facility] Alcohol intoxication 03/26/2017  Alcohol withdrawal delirium 03/26/2017  Chronic daily headache 02/25/2017  Alcohol use disorder, severe, dependence 02/22/2017  Heroin dependence MAT Methadone [place]/Subutex TBR 01/2018  Anxiety disorder, unspecified 01/20/2017  Paranoia 03/2018    [facility] Unk Pinto, NP - 04/12/2017 12:38 PM EST Formatting of this note might be different from the original. [facility] - Substance Abuse PHP/IOP Intake Assessment  BH Formulate an individual and appropriate relapse prevention plan.   [place] SU Problem: Formation of a relapse prevention plan    [facility] Uncomplicated opioid dependence (CMS/HCC) 12/24/2017  1. Discharge from hospital. 2. Follow up at [facility].                        [facility]()- Records requested-never     received             12/15/2007 -05/21/2010   Past Medical History:  Past Medical History:  Diagnosis Date   ADHD    Anxiety    Brain tumor ([place])    balance and occ memory issues and headaches   Brain tumor ([place])    sees [facility] q year   Depression    Headache    migraine and tension   Hypothyroidism  Soft tissue mass    left shoulder   Substance abuse [facility])     Past Surgical History:  Procedure Laterality Date   colonscopy     gamma knife radiation 2018 for brain tumor'     hematoma removed from arm Left    MASS EXCISION Left 06/30/2019   Procedure: EXCISION OF SOFT TISSUE  MASS LEFT SHOULDER;  Surgeon: Armandina Gemma, MD;  Location: [place];  Service: General;  Laterality: Left;  LMA    Family Psychiatric History:  Alcoholism and drug abuse on father's side   Family History:  [place]  Hypertension Mother   Hypertension Father    Social History:  Social History    Social History               Marital  status: Single      Spouse name: NA   Number of children: NA   Years of education: 16   Highest education level:    Occupational History   Not on file  Tobacco Use   Smoking status: Current Every Day Smoker      Types: Cigarettes   Smokeless tobacco: Never Used   Tobacco comment: quit Jan 2020  Vaping Use   Vaping Use: Never used  Substance and Sexual Activity   Alcohol use: Not Currently      Comment: quit oct 2019   Drug use: Yes      Types: Cocaine,Heroin  IV      Comment: last used OCT  2019 per pt Started MAT Buprenorphine   Sexual activity: Not on file  Other Topics Concern      Social History Narrative         Emergency planning/management officer Strain:    Difficulty of Paying Living Expenses: Disability  Food Insecurity:    Worried About Charity fundraiser in the Last Year: On disability   Arboriculturist in the Last Year:   Transportation Needs:    Film/video editor (Medical): Uses Ecologist (Non-Medical):   Physical Activity:    Days of Exercise per Week:    Minutes of Exercise per Session:   Stress:    Feeling of Stress : Constant   Social Connections:    Frequency of Communication with Friends and Family: Mother recently kicked him out of house   Frequency of Social Gatherings with Friends and Family: Lives alone-   Attends Religious Services: No   Active Member of Clubs or Organizations: Not active with AA /NA   Attends Archivist Meetings:          Socioeconomic History  Allergies:  Allergies  Allergen Reactions   Naloxone Palpitations    Pt report MAT Rx is Subutex/Buprenorphine only   Amphetamine-Dextroamphetamine Other (See Comments)    Metabolic Disorder Labs: Lab Results  Component Value Date   HGBA1C 5.0 10/03/2021   MPG 96.8 10/03/2021   No results found for: "PROLACTIN" Lab Results  Component Value Date   CHOL 191 10/03/2021   TRIG 92 10/03/2021   HDL 48 10/03/2021   CHOLHDL 4.0 10/03/2021   VLDL 18  10/03/2021   LDLCALC 125 (H) 10/03/2021   Lab Results  Component Value Date   TSH 0.417 (L) 11/10/2021   TSH 2.166 01/09/2018    Therapeutic Level Labs:NA  Current Medications: Current Outpatient Medications  Medication Sig Dispense Refill   gabapentin (NEURONTIN) 400 MG capsule Take 1 capsule (400 mg total) by mouth  2 (two) times daily for 180 doses. 180 capsule 0   buprenorphine (SUBUTEX) 8 MG SUBL SL tablet Place 24 mg under the tongue daily as needed.     diphenhydrAMINE (BENADRYL) 50 MG tablet Take 2-4 tablets q6h as needed for restlessness related to Haldol 100 tablet 2   haloperidol (HALDOL) 5 MG tablet Take 5 mg by mouth 2 (two) times daily.     hydrOXYzine (ATARAX) 25 MG tablet Take 25 mg by mouth 3 (three) times daily.     modafinil (PROVIGIL) 200 MG tablet Take 3 tablets (600 mg total) by mouth daily. 90 tablet 0   polyethylene glycol (MIRALAX / GLYCOLAX) 17 g packet Take 17 g by mouth daily. 30 each 0   propranolol (INDERAL) 20 MG tablet Take 20 mg by mouth 3 (three) times daily.     tamsulosin (FLOMAX) 0.4 MG CAPS capsule Take 2 capsules (0.8 mg total) by mouth daily after supper. 60 capsule 0   thyroid (ARMOUR THYROID) 120 MG tablet Take 1 tablet (120 mg total) by mouth daily before breakfast. 30 tablet 0   traZODone (DESYREL) 100 MG tablet Take 1 tablet (100 mg total) by mouth at bedtime. 30 tablet 0   valbenazine (INGREZZA) 40 MG capsule Take 1 capsule (40 mg total) by mouth daily. 30 capsule 2   No current facility-administered medications for this visit.     Musculoskeletal: Strength & Muscle Tone: increased Gait & Station: normal Patient leans: Front  Psychiatric Specialty Exam: Review of Systems  Constitutional:  Positive for activity change and fatigue. Negative for appetite change, chills, diaphoresis, fever and unexpected weight change.  HENT: Negative.    Eyes: Negative.   Respiratory: Negative.    Cardiovascular: Negative.   Gastrointestinal:  Negative.   Endocrine: Negative.  Negative for cold intolerance, heat intolerance, polydipsia, polyphagia and polyuria.  Genitourinary: Negative.   Musculoskeletal: Negative.   Skin:  Positive for rash (Rosacea). Negative for color change, pallor and wound.  Allergic/Immunologic: Negative for environmental allergies, food allergies and immunocompromised state.  Neurological:  Positive for tremors. Negative for dizziness, seizures, syncope, speech difficulty, weakness, light-headedness, numbness and headaches.  Hematological:  Negative for adenopathy. Does not bruise/bleed easily.  Psychiatric/Behavioral:  Positive for agitation, behavioral problems, dysphoric mood and sleep disturbance. Negative for confusion, decreased concentration, hallucinations, self-injury and suicidal ideas. The patient is nervous/anxious and is hyperactive.     There were no vitals taken for this visit.There is no height or weight on file to calculate BMI.  General Appearance: Casual  Eye Contact:  Fair  Speech:  Clear and Coherent and Normal Rate  Volume:  Normal  Mood:  Dysphoric  Affect:  Congruent  Thought Process:  Coherent, Goal Directed, and Descriptions of Associations: Intact  Orientation:  Full (Time, Place, and Person)  Thought Content: WDL   Suicidal Thoughts:  No  Homicidal Thoughts:  No  Memory:  Immediate;   Fair Recent;   Fair Remote;   Fair   Judgement:  Impaired  Insight:  Lacking  Psychomotor Activity:  EPS  Concentration:  Concentration: Good and Attention Span: Fair  Recall:  [employer] of Knowledge:  WDL  Language: Negative  Akathisia:  Yes  Handed:  Right  AIMS (if indicated): No change  Assets:  Desire for Improvement Financial Resources/Insurance Housing Resilience Social Support Talents/Skills Transportation Vocational/Educational  ADL's:  Impaired  Cognition: Impaired,  Moderate  Sleep:  Poor   Screenings: [place] Office Visit from 11/06/2021  in  [place] [facility] Visit from 10/23/2021 in [place] [facility] Visit from 10/16/2021 in [place] [facility] Admission (Discharged) from 10/02/2021 in [place] [facility]  AIMS Total Score '7 5 21 '$ 0      Flowsheet Row Admission (Discharged) from 10/02/2021 in [place] [facility] ED from 09/27/2021 in [place] DEPT  C-SSRS RISK CATEGORY High Risk High Risk        Assessment Recovered from [place] and living in supervised enviornment Thyroid studies abnormal-?TSH lab affected by medication Psychosis continues to be controlled with Haldol     and Plan: Resume Ingrezza as prescribed. Increase Armour Thyroid to 120 mg Rxs to [place] for blister pack FU 1 week  Addendum Initial Thyroid rx in [facility] has not been available for 2 yrs per Pharmacy New rx for 120 mg Armour sent 7/26  [name], PA-C 11/13/2021 3:43 PM

## 2021-11-19 ENCOUNTER — Other Ambulatory Visit (HOSPITAL_COMMUNITY): Payer: Self-pay | Admitting: Medical

## 2021-11-19 MED ORDER — THYROID 120 MG PO TABS: 120.0000 mg | ORAL_TABLET | Freq: Every day | ORAL | 0 refills | Status: DC

## 2021-11-20 ENCOUNTER — Ambulatory Visit (HOSPITAL_BASED_OUTPATIENT_CLINIC_OR_DEPARTMENT_OTHER): Payer: Medicare Other | Admitting: Medical

## 2021-11-20 ENCOUNTER — Telehealth (HOSPITAL_COMMUNITY): Payer: Self-pay | Admitting: *Deleted

## 2021-11-20 ENCOUNTER — Encounter (HOSPITAL_COMMUNITY): Payer: Self-pay | Admitting: Medical

## 2021-11-20 VITALS — BP 100/60 | HR 70 | Resp 16 | Ht 70.0 in | Wt 198.0 lb

## 2021-11-20 DIAGNOSIS — F1120 Opioid dependence, uncomplicated: Secondary | ICD-10-CM

## 2021-11-20 DIAGNOSIS — F060 Psychotic disorder with hallucinations due to known physiological condition: Secondary | ICD-10-CM | POA: Diagnosis not present

## 2021-11-20 DIAGNOSIS — N401 Enlarged prostate with lower urinary tract symptoms: Secondary | ICD-10-CM

## 2021-11-20 DIAGNOSIS — I10 Essential (primary) hypertension: Secondary | ICD-10-CM

## 2021-11-20 DIAGNOSIS — F1021 Alcohol dependence, in remission: Secondary | ICD-10-CM

## 2021-11-20 DIAGNOSIS — G2401 Drug induced subacute dyskinesia: Secondary | ICD-10-CM

## 2021-11-20 DIAGNOSIS — F1321 Sedative, hypnotic or anxiolytic dependence, in remission: Secondary | ICD-10-CM

## 2021-11-20 DIAGNOSIS — F1610 Hallucinogen abuse, uncomplicated: Secondary | ICD-10-CM

## 2021-11-20 DIAGNOSIS — L719 Rosacea, unspecified: Secondary | ICD-10-CM

## 2021-11-20 DIAGNOSIS — G4710 Hypersomnia, unspecified: Secondary | ICD-10-CM | POA: Diagnosis not present

## 2021-11-20 DIAGNOSIS — I639 Cerebral infarction, unspecified: Secondary | ICD-10-CM | POA: Diagnosis not present

## 2021-11-20 DIAGNOSIS — E039 Hypothyroidism, unspecified: Secondary | ICD-10-CM

## 2021-11-20 DIAGNOSIS — I6381 Other cerebral infarction due to occlusion or stenosis of small artery: Secondary | ICD-10-CM

## 2021-11-20 DIAGNOSIS — Z79899 Other long term (current) drug therapy: Secondary | ICD-10-CM

## 2021-11-20 DIAGNOSIS — D330 Benign neoplasm of brain, supratentorial: Secondary | ICD-10-CM

## 2021-11-20 DIAGNOSIS — F1421 Cocaine dependence, in remission: Secondary | ICD-10-CM

## 2021-11-20 DIAGNOSIS — Z923 Personal history of irradiation: Secondary | ICD-10-CM

## 2021-11-20 DIAGNOSIS — F1921 Other psychoactive substance dependence, in remission: Secondary | ICD-10-CM

## 2021-11-20 DIAGNOSIS — I6930 Unspecified sequelae of cerebral infarction: Secondary | ICD-10-CM | POA: Diagnosis not present

## 2021-11-20 DIAGNOSIS — G4730 Sleep apnea, unspecified: Secondary | ICD-10-CM

## 2021-11-20 MED ORDER — MODAFINIL 200 MG PO TABS: 600.0000 mg | ORAL_TABLET | Freq: Every day | ORAL | 2 refills | Status: DC

## 2021-11-20 MED ORDER — VALBENAZINE TOSYLATE 40 MG PO CAPS: 80.0000 mg | ORAL_CAPSULE | Freq: Every day | ORAL | 0 refills | Status: DC

## 2021-11-20 MED ORDER — TRAZODONE HCL 100 MG PO TABS
100.0000 mg | ORAL_TABLET | Freq: Every day | ORAL | 0 refills | Status: DC
Start: 2021-11-20 — End: 2022-01-29

## 2021-11-20 NOTE — Progress Notes (Addendum)
BH MD/PA/NP OP Progress Note  11/20/2021 4:18 PM Paul Bray  MRN:  [id number]  Chief Complaint:  Chief Complaint  Patient presents with   Follow-up   Post stroke sequellae   Hallucinations   Hypersomnia   Addiction Problem   Hypothyroidism   Hypertension   Benign Prostatic Hypertrophy   HPI: Paul Bray returns for FU on his stroke induced Hallucinosis and Hypersomnia as well as Hypothyroidism thought to be medication induced.Subjectively he feels better overall with T3 supplementation .There is a family history of T3 deficiency.Last week increase Rx failed to fill due to Software retaining rx that has not been available for 2 YEARS!!!! (Awaiting PA on new rx sent today). He also c/o low BP ie Diastolic pressure 17-51. He is only taking Tamulosin and Propranolol but believes Tamulosin is cause.He is taking #2 0.4 mg doses.He c[o of mild dizziness. He is requesting refills on Modanafil and Trazodone. He has started to get walking exercise. 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 He is here by himself today.  Visit Diagnosis:    ICD-10-CM   1. 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 Sequelae, post-stroke  I69.30     3. Hypersomnia with sleep apnea  G47.10    G47.30     4. Infarction of left basal ganglia (HCC)  I63.9     5. Multiple lacunar infarcts (HCC)  I63.81     6. Polysubstance dependence in early, early partial, sustained full, or sustained partial remission ([place])  F19.21     7. 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 Opioid dependence on maintenance agonist therapy, no symptoms (HCC)  F11.20     9. Alcohol use disorder, severe, in sustained remission (HCC)  F10.21     10. Sedative, hypnotic or anxiolytic use disorder, moderate, in sustained remission (HCC)  F13.21     11. Cocaine dependence in remission  ([place])  F14.21     12. 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 Status post gamma knife treatment  Z92.3     14. Tardive dyskinesia  G24.01     15. Acquired hypothyroidism  E03.9     16. Accelerated hypertension  I10     17. Benign prostatic hyperplasia with lower urinary tract symptoms, symptom details unspecified  N40.1     18. Rosacea  L71.9     19. Medication management  Z79.899       Past Psychiatric History:  Past Psychiatric History:  [place] Date of recent admission: 10/29/21 Date of recent discharge: 11/01/21    [facility] [place] [facility] [place] 6/17/-03/04/2020  Pt with Opiate dependence MAT Suboxone at [facility] Dr Lynnda Shields C/O of paranoia he is aware of but cant stop/completely control.Has rx for Risperdal from [employer]. Also hx of Gamma Knife tradiation fo benign brain tumor ?2018 Says has records Requesting Psychiatrist/medication mangement for paranoia hx [place] [facility] [place] 08/21/2021 - NOW [place] INPATIENT  Date of Admission:  10/02/2021 Date of Discharge: 10/13/2021   [facility] Alcohol intoxication 03/26/2017  Alcohol withdrawal delirium 03/26/2017  Chronic daily headache 02/25/2017  Alcohol use disorder, severe, dependence 02/22/2017  Heroin dependence MAT Methadone [place]/Subutex TBR 01/2018  Anxiety disorder, unspecified 01/20/2017  Paranoia 03/2018    [facility] Laurell Roof  O, NP - 04/12/2017 12:38 PM EST Formatting of this note might be different from the original. [facility] Psychiatry - Substance Abuse PHP/IOP Intake Assessment  BH Formulate an individual and appropriate relapse prevention plan.   [place] SU Problem: Formation of a relapse prevention plan    [facility] Uncomplicated opioid dependence (CMS/HCC) 12/24/2017  1. Discharge from hospital. 2. Follow up at [facility].                        [facility]([place])-  Records requested-never     received             12/15/2007 -05/21/2010   Past Medical History:  CARE EVERYWHERE review  Date Type Department Care Team Description  11/12/2021 11:00 AM EDT Office Visit [place]   [place]   [place], [place] [address]   [phone]   Ronnie Derby, PA-C   [place], [place] [address]   [phone] (Work)   [phone] (Fax)   Schizophrenia, unspecified type [facility]) (Primary Dx);  Benign neoplasm of brain, unspecified brain region [facility]);  Hospital discharge follow-up Discharge Disposition: Home or Self Care   Past Medical History:  Diagnosis Date   ADHD    Anxiety    Brain tumor ([place])    balance and occ memory issues and headaches   Brain tumor ([place])    sees [facility] q year   Depression    Headache    migraine and tension   Hypothyroidism    Soft tissue mass    left shoulder   Substance abuse ([place])    MRI BRAIN WITHOUT CONTRAST, 10/29/2018 2:54 PM     COMPARISON: CT brain 02/17/2017, MR brain 04/15/2016 and prior.  FINDINGS:   Calvarium/skull base: No focal marrow replacing lesion suggestive of neoplasm.  Orbits: No focal mass.  Paranasal sinuses: No air-fluid levels or substantial mucosal disease.Mild mucosal thickening of the bilateral ethmoid air cells.  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 The mass again demonstrates internal susceptibility artifact, which could reflect calcification or hemosiderin. This mass appears to have decreased in size compared to a 2011 MR study where it measured 1.5 x 1.8 x 1 cm. Remote lacunar infarcts in the left basal ganglia. No evidence of acute abnormality. No significant white matter disease. No evidence of acute infarct. No mass effect, acute hemorrhage,  or hydrocephalus. Grossly normal flow-related signal in the major intracranial arteries and dural sinuses.  CONCLUSION:  1.  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 No hydrocephalus. Attention on follow up imaging advised.  2.  To note, for future follow up imaging consider placing an IV line access before patient undergoes contrast enhanced imaging.  3.  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 No detected  progression or bleeding since prior. No hydrocephalus. Chronic  lacune at the left caudate head and left putamen. No evidence of  acute infarct or hemorrhage. Vascular: No hyperdense vessel or  unexpected calcification.  Skull: Normal. Negative for fracture or focal lesion.  Sinuses/Orbits: No acute finding.   IMPRESSION:  1. No acute finding or change from January 2022.  2. 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  3. Chronic lacunar infarct at the left basal ganglia.     Past Surgical History:  Procedure Laterality Date   colonscopy     gamma knife radiation 2018 for brain tumor'     hematoma removed from arm Left    MASS EXCISION Left 06/30/2019   Procedure: EXCISION OF SOFT TISSUE  MASS LEFT SHOULDER;  Surgeon: Armandina Gemma, MD;  Location: [facility];  Service: General;  Laterality: Left;  LMA    Family Psychiatric History:  Alcoholism and drug abuse on father's side   Family History:   [place]  Hypertension Mother   Hypertension Father   Social History:  Social History              Marital status: Single      Spouse name: NA   Number of children: NA   Years of education: 16   Highest education level:    Occupational History   Not on file  Tobacco Use   Smoking status:  Current Every Day Smoker      Types: Cigarettes   Smokeless tobacco: Never Used   Tobacco comment: quit Jan 2020  Vaping Use   Vaping Use: Never used  Substance and Sexual Activity   Alcohol use: Not Currently      Comment: quit oct 2019   Drug use: Yes      Types: Cocaine,Heroin  IV      Comment: last used OCT  2019 per pt Started MAT Buprenorphine   Sexual activity: Not on file  Other Topics Concern      Social History Narrative         Emergency planning/management officer Strain:    Difficulty of Paying Living Expenses: Disability  Food Insecurity:    Worried About Charity fundraiser in the Last Year: On disability   Arboriculturist in the Last Year:   Transportation Needs:    Film/video editor (Medical): Uses Ecologist (Non-Medical):   Physical Activity:    Days of Exercise per Week:    Minutes of Exercise per Session:   Stress:    Feeling of Stress : Constant   Social Connections:    Frequency of Communication with Friends and Family: Mother recently kicked him out of house   Frequency of Social Gatherings with Friends and Family: Lives alone-   Attends Religious Services: No   Active Member of Clubs or Organizations: Not active with AA /NA   Attends Archivist Meetings:            Allergies:  Allergies  Allergen Reactions   Naloxone Palpitations    Pt report MAT Rx is Subutex/Buprenorphine only   Amphetamine-Dextroamphetamine Other (See Comments)    Metabolic Disorder Labs: Lab Results  Component Value Date   HGBA1C 5.0 10/03/2021   MPG 96.8 10/03/2021   No results found for: "PROLACTIN" Lab Results  Component Value Date   CHOL 191 10/03/2021   TRIG 92 10/03/2021   HDL 48 10/03/2021   CHOLHDL 4.0 10/03/2021   VLDL 18 10/03/2021   LDLCALC 125 (H) 10/03/2021   Lab Results  Component Value Date   TSH 0.417 (L) 11/10/2021   TSH 2.166 01/09/2018    Therapeutic  Level Labs:NA  Current Medications: Current Outpatient  Medications  Medication Sig Dispense Refill   buprenorphine (SUBUTEX) 8 MG SUBL SL tablet Place 24 mg under the tongue daily as needed.     diphenhydrAMINE (BENADRYL) 50 MG tablet Take 2-4 tablets q6h as needed for restlessness related to Haldol 100 tablet 2   gabapentin (NEURONTIN) 400 MG capsule Take 1 capsule (400 mg total) by mouth 2 (two) times daily for 180 doses. 180 capsule 0   haloperidol (HALDOL) 5 MG tablet Take 5 mg by mouth 2 (two) times daily.     hydrOXYzine (ATARAX) 25 MG tablet Take 25 mg by mouth 3 (three) times daily.     modafinil (PROVIGIL) 200 MG tablet Take 3 tablets (600 mg total) by mouth daily. 90 tablet 2   polyethylene glycol (MIRALAX / GLYCOLAX) 17 g packet Take 17 g by mouth daily. 30 each 0   propranolol (INDERAL) 20 MG tablet Take 20 mg by mouth 3 (three) times daily.     tamsulosin (FLOMAX) 0.4 MG CAPS capsule Take 2 capsules (0.8 mg total) by mouth daily after supper. 60 capsule 0   thyroid (ARMOUR THYROID) 120 MG tablet Take 1 tablet (120 mg total) by mouth daily before breakfast. 30 tablet 0   traZODone (DESYREL) 100 MG tablet Take 1 tablet (100 mg total) by mouth at bedtime. 90 tablet 0   valbenazine (INGREZZA) 40 MG capsule Take 2 capsules (80 mg total) by mouth daily. 60 capsule 0   No current facility-administered medications for this visit.     Musculoskeletal: Strength & Muscle Tone: increased Gait & Station: normal Patient leans: Front  Psychiatric Specialty Exam: Review of Systems  Constitutional:  Positive for activity change (Exercising/Working again on Computer program hoping to be able to work again). Negative for appetite change, chills, diaphoresis, fatigue, fever and unexpected weight change.  HENT: Negative.    Eyes: Negative.   Respiratory: Negative.    Cardiovascular:  Negative for chest pain, palpitations and leg swelling.       BP low DBP 50-60 Tamulosin and Propranolol  Gastrointestinal: Negative.   Endocrine: Negative for cold  intolerance, heat intolerance, polydipsia, polyphagia and polyuria.       TSH low Rx failed to fill due to Software retaining rx that has not been available for 2 YEARS!!!! Awaiting PA on new rx sent today  Genitourinary:  Negative for decreased urine volume (BPH Rx controls), difficulty urinating (Rx Tamulosin), dysuria, enuresis, flank pain, frequency, genital sores, hematuria, penile discharge, penile pain, penile swelling, scrotal swelling, testicular pain and urgency.       BPH/Rx Tamulosin  Musculoskeletal:  Negative for arthralgias, back pain, gait problem, joint swelling, myalgias, neck pain and neck stiffness.  Skin:  Negative for color change, pallor, rash (Rosacea controlled) and wound.       Rosacea  Allergic/Immunologic: Negative for environmental allergies, food allergies and immunocompromised state.  Neurological:  Positive for tremors (Familial Tremor and EPS) and light-headedness (Relates to c/o Low BP-Orthostasis). Negative for dizziness, seizures, syncope, facial asymmetry, speech difficulty, weakness, numbness and headaches.  Hematological:  Negative for adenopathy. Does not bruise/bleed easily.  Psychiatric/Behavioral:  Positive for sleep disturbance (Improved). Negative for agitation, behavioral problems, confusion, decreased concentration, dysphoric mood, hallucinations, self-injury and suicidal ideas. The patient is nervous/anxious. The patient is not hyperactive.     Blood pressure 100/60, pulse 70, resp. rate 16, height '5\' 10"'$  (1.778 m), weight 198 lb (89.8 kg).Body mass index is 28.41 kg/m.  General Appearance:  Casual  Eye Contact:  Good  Speech:  Clear and Coherent  Volume:  Normal  Mood:  Euthymic  Affect:  Appropriate and Congruent  Thought Process:  Coherent, Goal Directed, and Descriptions of Associations: Intact  Orientation:  Full (Time, Place, and Person)  Thought Content: WDL   Suicidal Thoughts:  No  Homicidal Thoughts:  No  Memory:  Immediate;    Fair Recent;   Fair Remote;   Fair  Judgement:  Other:  APPEARS TO BE IMPROVING  Insight:   Limited  Psychomotor Activity:  Increased  Concentration:  Concentration: Good and Attention Span: Good  Recall:  [place] of Knowledge:  WDL  Language: Good  Akathisia:  Yes  Handed:  Right  AIMS (if indicated): done  Assets:  Communication Skills Desire for Improvement Financial Resources/Insurance Housing Resilience Social Support Talents/Skills Transportation Vocational/Educational  ADL's:  Intact  Cognition: Impaired,  Mild and Moderate  Sleep:   Improving   Screenings: [place] Office Visit from 11/06/2021 in [facility] Office Visit from 10/23/2021 in [facility] Office Visit from 10/16/2021 in [facility] Admission (Discharged) from 10/02/2021 in [facility] [facility]  AIMS Total Score '7 5 21 '$ 0      Flowsheet Row Admission (Discharged) from 10/02/2021 in [facility] ED from 09/27/2021 in [place] DEPT  C-SSRS RISK CATEGORY High Risk High Risk        Assessment : Seems definitely improved except for involuntary movements. Needs Thyroid rx-has a week supply on hand awaiting PA  and Plan:  Refill meds needed Decrease Tamulosin to 1 ).4 mg daily Increase Ingrezza to 80 mg PO FU 1 week /Sooner if needed Repeat Thyroid MRI is scheduled to address his sense that tumor is growing?  Collaboration of Care: Collaboration of Care: Medication Management AEB    Patient/Guardian was advised Release of Information must be obtained prior to any record release in order to collaborate their care with an outside provider. Patient/Guardian was advised if they have not already done so to contact the registration department to sign all necessary forms in order for [place] to  release information regarding their care.   Consent: Patient/Guardian gives verbal consent for treatment and assignment of benefits for services provided during this visit. Patient/Guardian expressed understanding and agreed to proceed.    [name], PA-C 11/20/2021 4:18 PM   ADDENDUM. Received call from Pharmacist Saturday7/29 regarding patient. He/his mother called Pharmacy saying that increase of Ingrezza to '80mg'$  resulted in patient chewing his tongue.(This had occurred when he overtook his Ingrezza as well) Pharmacist suggested 60 mg dose. Contacted Mother and informed her of this and adevised if he has the same reaction to 60 mg to STOP [facility] and use B3enadryl til next appt 11/27/2021

## 2021-11-20 NOTE — Progress Notes (Deleted)
Has been back on Ingrezza 1 week and today has obvious Lt leg akathesia hwe does not notice

## 2021-11-20 NOTE — Telephone Encounter (Signed)
PA submitted for Armour 120 mg #30 through CoverMyMeds.   Awaiting expedited review.  Key: [id number]

## 2021-11-25 ENCOUNTER — Telehealth (HOSPITAL_COMMUNITY): Payer: Self-pay | Admitting: *Deleted

## 2021-11-25 NOTE — Telephone Encounter (Signed)
PA FOR MODAFINIL 200 MG TABLETS (600 MG TOTAL DOSE)SUBMITTED TO [facility] VIA COVER MY MEDS.  PA CASE ID: [id number]  KEY: [place]  AWAITING DETERMINATION

## 2021-11-26 NOTE — Telephone Encounter (Signed)
OK 

## 2021-11-27 ENCOUNTER — Ambulatory Visit (HOSPITAL_BASED_OUTPATIENT_CLINIC_OR_DEPARTMENT_OTHER): Payer: Medicare Other | Admitting: Medical

## 2021-11-27 ENCOUNTER — Encounter (HOSPITAL_COMMUNITY): Payer: Self-pay | Admitting: Medical

## 2021-11-27 DIAGNOSIS — G2401 Drug induced subacute dyskinesia: Secondary | ICD-10-CM

## 2021-11-27 DIAGNOSIS — E039 Hypothyroidism, unspecified: Secondary | ICD-10-CM

## 2021-11-27 DIAGNOSIS — Z79899 Other long term (current) drug therapy: Secondary | ICD-10-CM

## 2021-11-27 DIAGNOSIS — I639 Cerebral infarction, unspecified: Secondary | ICD-10-CM

## 2021-11-27 DIAGNOSIS — F060 Psychotic disorder with hallucinations due to known physiological condition: Secondary | ICD-10-CM

## 2021-11-27 DIAGNOSIS — D330 Benign neoplasm of brain, supratentorial: Secondary | ICD-10-CM

## 2021-11-27 DIAGNOSIS — L719 Rosacea, unspecified: Secondary | ICD-10-CM

## 2021-11-27 DIAGNOSIS — G4730 Sleep apnea, unspecified: Secondary | ICD-10-CM

## 2021-11-27 DIAGNOSIS — F1120 Opioid dependence, uncomplicated: Secondary | ICD-10-CM

## 2021-11-27 DIAGNOSIS — I6930 Unspecified sequelae of cerebral infarction: Secondary | ICD-10-CM

## 2021-11-27 DIAGNOSIS — G4710 Hypersomnia, unspecified: Secondary | ICD-10-CM | POA: Diagnosis not present

## 2021-11-27 DIAGNOSIS — I6381 Other cerebral infarction due to occlusion or stenosis of small artery: Secondary | ICD-10-CM

## 2021-11-27 DIAGNOSIS — F1610 Hallucinogen abuse, uncomplicated: Secondary | ICD-10-CM

## 2021-11-27 DIAGNOSIS — G250 Essential tremor: Secondary | ICD-10-CM

## 2021-11-27 DIAGNOSIS — F1421 Cocaine dependence, in remission: Secondary | ICD-10-CM

## 2021-11-27 DIAGNOSIS — F1321 Sedative, hypnotic or anxiolytic dependence, in remission: Secondary | ICD-10-CM

## 2021-11-27 DIAGNOSIS — N401 Enlarged prostate with lower urinary tract symptoms: Secondary | ICD-10-CM

## 2021-11-27 DIAGNOSIS — F1021 Alcohol dependence, in remission: Secondary | ICD-10-CM

## 2021-11-27 DIAGNOSIS — Z923 Personal history of irradiation: Secondary | ICD-10-CM

## 2021-11-27 DIAGNOSIS — I10 Essential (primary) hypertension: Secondary | ICD-10-CM

## 2021-11-27 DIAGNOSIS — F1921 Other psychoactive substance dependence, in remission: Secondary | ICD-10-CM

## 2021-11-27 NOTE — Progress Notes (Signed)
Pt has started Ingrezza and tolerated 40 mg but not '80mg'$ .Has been on '60mg'$  past week and is happy with result butdoes display some intermittent involuntray movements during visit he does not sem to notice

## 2021-11-27 NOTE — Progress Notes (Signed)
[place] MD/PA/NP OP Progress Note  11/27/2021 4:08 PM Paul Bray  MRN:  [id number]  Chief Complaint:  Chief Complaint  Patient presents with   Follow-up   Lacunar infarcts   Addiction Problem   Hallucinations   Medication Reaction   Tardive Dyskinesia   HPI: Paul Bray returns for FU on his Post stroke psychosis.hypersomnia ,Polysubstance dependencies in remission with MAT Subutex.,TD from Haldol rx with intolerance of 80 mg dose of Ingrezza. 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 Last visit he was told to go to 80 mg dose of INGREZZA FROM 40 MG. WITHIN A COUPLE OF DAYS HE BEGAN TO CHEW HIS TONGUE INVOLUNTARILY. HE HAD THIS REACTION WHEN HE OVERTOOK the Ingrezza.Pharmacy contact resulted in plan to try the 60 mg dose and he reports "everything is ok".  He has no medication needs today saying that Modanafil is easier to get wit cash than insurance (a PA is being pursued by the Pharmacy for the higher dose). Paul Bray reports he has gotten all of his stuff moved to his mother's.He has his computers set up and continues to work on a [employer] noted in prior notes. He also reports he has his Guitars to "keep him busy" He has not gotten out since COVID to any 12 step meetings but will consider this. He is aware that the lowest dose possible of Haldol is a goal He wonders if he could take 1 '5mg'$ /day rather than 2?  Visit Diagnosis:    ICD-10-CM   1. Sequelae, post-stroke  I69.30     2. 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 Hypersomnia with sleep apnea  G47.10    G47.30     4. Infarction of left basal ganglia (HCC)  I63.9     5. Multiple lacunar infarcts (HCC)  I63.81     6. Polysubstance dependence in early, early partial, sustained full, or sustained partial remission ([place])  F19.21     7. 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 Opioid dependence on maintenance agonist therapy, no symptoms (HCC)  F11.20      9. Alcohol use disorder, severe, in sustained remission (HCC)  F10.21     10. Sedative, hypnotic or anxiolytic use disorder, moderate, in sustained remission (HCC)  F13.21     11. Cocaine dependence in remission ([place])  F14.21     12. 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 Status post gamma knife treatment  Z92.3     14. Tardive dyskinesia  G24.01     15. Acquired hypothyroidism  E03.9     16. Persistent hypersomnia  G47.10     17. Benign prostatic hyperplasia with lower urinary tract symptoms, symptom details unspecified  N40.1     18. Rosacea  L71.9     19. Essential hypertension  I10     20. Familial tremor  G25.0     21. Medication management  Z79.899       Past Psychiatric History:  [place] Date of recent admission: 10/29/21 Date of recent discharge: 11/01/21    [facility] [place] 6/17/-03/04/2020  Pt with Opiate dependence MAT Suboxone at [facility] Lynnda Shields C/O of paranoia he is aware of but cant stop/completely control.Has rx for Risperdal from [employer]. Also hx of Gamma Knife tradiation fo benign brain tumor ?2018 Says has records Requesting  Psychiatrist/medication mangement for paranoia hx [facility] [place] 08/21/2021 - NOW [facility] INPATIENT  Date of Admission:  10/02/2021 Date of Discharge: 10/13/2021   [facility] Alcohol intoxication 03/26/2017  Alcohol withdrawal delirium 03/26/2017  Chronic daily headache 02/25/2017  Alcohol use disorder, severe, dependence 02/22/2017  Heroin dependence MAT Methadone [place]/Subutex TBR 01/2018  Anxiety disorder, unspecified 01/20/2017  Paranoia 03/2018    [facility] Unk Pinto, NP - 04/12/2017 12:38 PM EST Formatting of this note might be different from the original. [facility] - Substance Abuse PHP/IOP Intake Assessment  BH Formulate an individual and appropriate relapse prevention plan.   [place] SU Problem:  Formation of a relapse prevention plan    [facility] Uncomplicated opioid dependence (CMS/HCC) 12/24/2017  1. Discharge from hospital. 2. Follow up at [facility].                        [facility])- Records requested-never     received             12/15/2007 -05/21/2010    CARE EVERYWHERE review   Date Type Department Care Team Description  11/12/2021 11:00 AM EDT Office Visit [facility]   [place]   [place], [place] [address]   [phone]   Ronnie Derby, PA-C   [place], [place] [address]   [phone] (Work)   [phone] (Fax)   Schizophrenia, unspecified type [facility]) (Primary Dx);  Benign neoplasm of brain, unspecified brain region [facility]);  Hospital discharge follow-up Discharge Disposition: Home or Self Care   Past Medical History:  Diagnosis Date   ADHD    Anxiety    Brain tumor ([place])    balance and occ memory issues and headaches   Brain tumor ([place])    sees [facility] q year   Depression    Headache    migraine and tension   Hypothyroidism    Soft tissue mass    left shoulder   Substance abuse ([place])   CARE EVERYWHERE REVIEW  MRI BRAIN WITHOUT CONTRAST, 10/29/2018 2:54 PM     COMPARISON: CT brain 02/17/2017, MR brain 04/15/2016 and prior.  FINDINGS:   Calvarium/skull base: No focal marrow replacing lesion suggestive of neoplasm.  Orbits: No focal mass.  Paranasal sinuses: No air-fluid levels or substantial mucosal disease.Mild mucosal thickening of the bilateral ethmoid air cells.  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 The mass again demonstrates internal susceptibility artifact, which could reflect calcification or hemosiderin. This mass appears to have  decreased in size compared to a 2011 MR study where it measured 1.5 x 1.8 x 1 cm. Remote lacunar infarcts in the left basal ganglia. No evidence of acute abnormality. No significant white matter disease. No evidence of acute infarct. No mass effect, acute hemorrhage, or hydrocephalus. Grossly normal flow-related signal in the major intracranial arteries and dural sinuses.  CONCLUSION:  1.  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 No hydrocephalus. Attention on follow up imaging advised.  2.  To note, for future follow up imaging consider placing an IV line access before patient undergoes contrast enhanced imaging.  3.  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 No detected  progression or bleeding since prior. No hydrocephalus. Chronic  lacune at the left caudate head and left putamen. No evidence of  acute infarct or hemorrhage. Vascular: No hyperdense vessel or unexpected calcification.  Skull: Normal. Negative for fracture or focal lesion.  Sinuses/Orbits: No acute finding.   IMPRESSION:  1. No acute finding or change from January 2022.  2. 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  3. Chronic lacunar infarct at the left basal ganglia.        Past Surgical History:  Procedure Laterality Date   colonscopy     gamma knife radiation 2018 for brain tumor'     hematoma removed from arm Left    MASS EXCISION Left 06/30/2019   Procedure: EXCISION OF SOFT TISSUE  MASS LEFT SHOULDER;  Surgeon: Armandina Gemma, MD;  Location: [facility];  Service: General;  Laterality: Left;  LMA    Family Psychiatric History:  Alcoholism and drug abuse on father's side   Family History: [place]  Hypertension Mother    Hypertension Father   Social History:  Social History               Marital status: Single      Spouse name: NA   Number of children: NA   Years of education: 16   Highest education level:    Occupational History   Not on file  Tobacco Use   Smoking status: Current Every Day Smoker      Types: Cigarettes   Smokeless tobacco: Never Used   Tobacco comment: quit Jan 2020  Vaping Use   Vaping Use: Never used  Substance and Sexual Activity   Alcohol use: Not Currently      Comment: quit oct 2019   Drug use: Yes      Types: Cocaine,Heroin  IV      Comment: last used OCT  2019 per pt Started MAT Buprenorphine   Sexual activity: Not on file  Other Topics Concern      Social History Narrative         Emergency planning/management officer Strain:    Difficulty of Paying Living Expenses: Disability  Food Insecurity:    Worried About Charity fundraiser in the Last Year: On disability   Arboriculturist in the Last Year:   Transportation Needs:    Film/video editor (Medical): Uses Ecologist (Non-Medical):   Physical Activity:    Days of Exercise per Week:    Minutes of Exercise per Session:   Stress:    Feeling of Stress : Constant   Social Connections:    Frequency of Communication with Friends and Family: Mother recently kicked him out of house   Frequency of Social Gatherings with Friends and Family: Lives alone-   Attends Religious Services: No   Active Member of Clubs or Organizations: Not active with AA /NA   Attends Engineer, production   Allergies:  Allergies  Allergen Reactions   Ingrezza [Valbenazine Tosylate] Other (See Comments)    Severe tongue chewing at 80 mg Tolerates lower doses   Naloxone Palpitations    Pt report MAT Rx is Subutex/Buprenorphine only   Amphetamine-Dextroamphetamine Other (See Comments)    Metabolic  Disorder Labs: Lab Results  Component Value Date   HGBA1C 5.0 10/03/2021   MPG 96.8 10/03/2021   No results  found for: "PROLACTIN" Lab Results  Component Value Date   CHOL 191 10/03/2021   TRIG 92 10/03/2021   HDL 48 10/03/2021   CHOLHDL 4.0 10/03/2021   VLDL 18 10/03/2021   LDLCALC 125 (H) 10/03/2021   Lab Results  Component Value Date   TSH 0.417 (L) 11/10/2021   TSH 2.166 01/09/2018    Therapeutic Level Labs:NA  PDMP- Rx as prescribed  Current Medications: Current Outpatient Medications  Medication Sig Dispense Refill   buprenorphine (SUBUTEX) 8 MG SUBL SL tablet Place 24 mg under the tongue daily as needed.     diphenhydrAMINE (BENADRYL) 50 MG tablet Take 2-4 tablets q6h as needed for restlessness related to Haldol 100 tablet 2   gabapentin (NEURONTIN) 400 MG capsule Take 1 capsule (400 mg total) by mouth 2 (two) times daily for 180 doses. 180 capsule 0   haloperidol (HALDOL) 5 MG tablet Take 5 mg by mouth 2 (two) times daily.     hydrOXYzine (ATARAX) 25 MG tablet Take 25 mg by mouth 3 (three) times daily.     INGREZZA 60 MG capsule Take 60 mg by mouth daily.     modafinil (PROVIGIL) 200 MG tablet Take 3 tablets (600 mg total) by mouth daily. 90 tablet 2   polyethylene glycol (MIRALAX / GLYCOLAX) 17 g packet Take 17 g by mouth daily. 30 each 0   propranolol (INDERAL) 20 MG tablet Take 20 mg by mouth 3 (three) times daily.     tamsulosin (FLOMAX) 0.4 MG CAPS capsule Take 2 capsules (0.8 mg total) by mouth daily after supper. 60 capsule 0   thyroid (ARMOUR THYROID) 120 MG tablet Take 1 tablet (120 mg total) by mouth daily before breakfast. 30 tablet 0   traZODone (DESYREL) 100 MG tablet Take 1 tablet (100 mg total) by mouth at bedtime. 90 tablet 0   No current facility-administered medications for this visit.     Musculoskeletal: Strength & Muscle Tone: within normal limits Gait & Station: normal Patient leans: N/A  Psychiatric Specialty Exam: Review of Systems  Constitutional:  Positive for activity change (currently homeostatic). Negative for appetite change, chills,  diaphoresis, fatigue, fever and unexpected weight change.  HENT: Negative.    Eyes: Negative.   Respiratory: Negative.    Cardiovascular: Negative.   Gastrointestinal: Negative.   Endocrine: Negative for cold intolerance, heat intolerance, polydipsia, polyphagia and polyuria.  Genitourinary:  Positive for difficulty urinating (B[facility] rx Flomax). Negative for decreased urine volume, dysuria, enuresis, flank pain, frequency, genital sores, hematuria, penile discharge, penile pain, penile swelling, scrotal swelling, testicular pain and urgency.  Musculoskeletal: Negative.   Skin:  Positive for rash (Rosacea). Negative for color change, pallor and wound.  Allergic/Immunologic: Negative for environmental allergies, food allergies and immunocompromised state.  Neurological:  Positive for tremors. Negative for dizziness, seizures, syncope, facial asymmetry, speech difficulty, weakness, light-headedness, numbness and headaches.       MRI due 12/05/21  Hematological: Negative.  Does not bruise/bleed easily.  Psychiatric/Behavioral:  Positive for sleep disturbance (Hypersomnia rx Modafinil). Negative for agitation, behavioral problems, confusion, decreased concentration, dysphoric mood, hallucinations, self-injury and suicidal ideas. The patient is not nervous/anxious and is not hyperactive.     There were no vitals taken for this visit.There is no height or weight on file to calculate BMI.  General Appearance: Casual and Well Groomed  Eye Contact:  Good  Speech:  Clear and Coherent  Volume:  Normal  Mood:  Euthymic  Affect:  Appropriate and Congruent  Thought Process:  Coherent, Goal Directed, and Descriptions of Associations: Intact  Orientation:  Full (Time, Place, and Person)  Thought Content: WDL and Logical   Suicidal Thoughts:  No  Homicidal Thoughts:  No  Memory:   Substance use and psychotic losses  Judgement:  Other:  Improved  Insight:  Fair  Psychomotor Activity:  Restlessness and TD   Concentration:  Concentration: Good and Attention Span: Good  Recall:   See memory  Fund of Knowledge:  WDL  Language: Good  Akathisia:   TD  Handed:  Right  AIMS (if indicated): done  Assets:  Communication Skills Desire for Improvement Financial Resources/Insurance Housing Leisure Time Resilience Social Support Talents/Skills Transportation Vocational/Educational  ADL's:  Intact  Cognition: WNL  Sleep:   No complaint   Screenings: [place] Office Visit from 11/06/2021 in [facility] Office Visit from 10/23/2021 in [facility] Office Visit from 10/16/2021 in [facility] Admission (Discharged) from 10/02/2021 in [facility]  AIMS Total Score '7 5 21 '$ 0      Flowsheet Row Admission (Discharged) from 10/02/2021 in [facility] ED from 09/27/2021 in [facility]  C-SSRS RISK CATEGORY High Risk High Risk        Assessment : Finally has acheived homeostasis/asymptomatic but involuntary movements not completely controlled medication so far Wants to try decreasing Haldol   and Plan: Continue current meds with exception of Haldol.which he will decrease to 7.5 mg (1&1/2  5 mg Tabs) q am. Pt will inform mother and both will be vigilant to any return signs of Psychosis FU 1 week  [name], PA-C 11/27/2021, 4:08 PM

## 2021-12-04 ENCOUNTER — Ambulatory Visit (HOSPITAL_COMMUNITY): Payer: Medicare Other | Admitting: Medical

## 2021-12-05 ENCOUNTER — Other Ambulatory Visit (HOSPITAL_COMMUNITY): Payer: Self-pay | Admitting: Medical

## 2021-12-05 ENCOUNTER — Encounter (HOSPITAL_COMMUNITY): Payer: Self-pay

## 2021-12-05 ENCOUNTER — Ambulatory Visit (HOSPITAL_COMMUNITY)
Admission: RE | Admit: 2021-12-05 | Discharge: 2021-12-05 | Disposition: A | Discharge status: Home / Self Care | Payer: Medicare Other | Source: Ambulatory Visit | Attending: Medical | Admitting: Medical

## 2021-12-05 DIAGNOSIS — I639 Cerebral infarction, unspecified: Secondary | ICD-10-CM

## 2021-12-05 DIAGNOSIS — Z923 Personal history of irradiation: Secondary | ICD-10-CM

## 2021-12-05 DIAGNOSIS — C719 Malignant neoplasm of brain, unspecified: Secondary | ICD-10-CM

## 2021-12-05 MED ORDER — HALOPERIDOL 5 MG PO TABS: 5.0000 mg | ORAL_TABLET | Freq: Every day | ORAL | 0 refills | Status: DC

## 2021-12-05 MED ORDER — MODAFINIL 200 MG PO TABS: 600.0000 mg | ORAL_TABLET | Freq: Every day | ORAL | 2 refills | Status: DC

## 2021-12-05 MED ORDER — THYROID 120 MG PO TABS: 120.0000 mg | ORAL_TABLET | Freq: Every day | ORAL | 0 refills | Status: DC

## 2021-12-05 MED ORDER — INGREZZA 60 MG PO CAPS: 60.0000 mg | ORAL_CAPSULE | Freq: Every day | ORAL | 2 refills | Status: DC

## 2021-12-05 NOTE — Progress Notes (Signed)
Pt unable to keep appt on 12/04/21 Sent text requesting refills. His appt is rescheduled for next Thursday and he is informed. Also reports unable to do MRI-claustrophobic in machine-will reschedule to Open MRI at next visit

## 2021-12-11 ENCOUNTER — Ambulatory Visit (HOSPITAL_BASED_OUTPATIENT_CLINIC_OR_DEPARTMENT_OTHER): Payer: Medicare Other | Admitting: Medical

## 2021-12-11 ENCOUNTER — Encounter (HOSPITAL_COMMUNITY): Payer: Self-pay | Admitting: Medical

## 2021-12-11 DIAGNOSIS — D330 Benign neoplasm of brain, supratentorial: Secondary | ICD-10-CM

## 2021-12-11 DIAGNOSIS — E039 Hypothyroidism, unspecified: Secondary | ICD-10-CM

## 2021-12-11 DIAGNOSIS — G4730 Sleep apnea, unspecified: Secondary | ICD-10-CM

## 2021-12-11 DIAGNOSIS — G4710 Hypersomnia, unspecified: Secondary | ICD-10-CM

## 2021-12-11 DIAGNOSIS — F1120 Opioid dependence, uncomplicated: Secondary | ICD-10-CM

## 2021-12-11 DIAGNOSIS — F1021 Alcohol dependence, in remission: Secondary | ICD-10-CM

## 2021-12-11 DIAGNOSIS — F1421 Cocaine dependence, in remission: Secondary | ICD-10-CM

## 2021-12-11 DIAGNOSIS — F1921 Other psychoactive substance dependence, in remission: Secondary | ICD-10-CM

## 2021-12-11 DIAGNOSIS — F1610 Hallucinogen abuse, uncomplicated: Secondary | ICD-10-CM

## 2021-12-11 DIAGNOSIS — I10 Essential (primary) hypertension: Secondary | ICD-10-CM

## 2021-12-11 DIAGNOSIS — I639 Cerebral infarction, unspecified: Secondary | ICD-10-CM | POA: Diagnosis not present

## 2021-12-11 DIAGNOSIS — G2401 Drug induced subacute dyskinesia: Secondary | ICD-10-CM

## 2021-12-11 DIAGNOSIS — Z79899 Other long term (current) drug therapy: Secondary | ICD-10-CM

## 2021-12-11 DIAGNOSIS — F060 Psychotic disorder with hallucinations due to known physiological condition: Secondary | ICD-10-CM | POA: Diagnosis not present

## 2021-12-11 DIAGNOSIS — F1321 Sedative, hypnotic or anxiolytic dependence, in remission: Secondary | ICD-10-CM

## 2021-12-11 DIAGNOSIS — Z923 Personal history of irradiation: Secondary | ICD-10-CM

## 2021-12-11 DIAGNOSIS — I6381 Other cerebral infarction due to occlusion or stenosis of small artery: Secondary | ICD-10-CM

## 2021-12-11 DIAGNOSIS — I6930 Unspecified sequelae of cerebral infarction: Secondary | ICD-10-CM | POA: Diagnosis not present

## 2021-12-11 DIAGNOSIS — L719 Rosacea, unspecified: Secondary | ICD-10-CM

## 2021-12-11 DIAGNOSIS — N401 Enlarged prostate with lower urinary tract symptoms: Secondary | ICD-10-CM

## 2021-12-11 DIAGNOSIS — G250 Essential tremor: Secondary | ICD-10-CM

## 2021-12-11 MED ORDER — DIPHENHYDRAMINE HCL 50 MG PO TABS: ORAL_TABLET | ORAL | 2 refills | Status: DC

## 2021-12-11 MED ORDER — AUSTEDO XR 6 MG PO TB24: 6.0000 mg | ORAL_TABLET | Freq: Every day | ORAL | 0 refills | Status: DC

## 2021-12-11 NOTE — Progress Notes (Addendum)
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 He is currently on 5 mg daily of Haldol 1/2 tabley BID. He was unable to tolerate the closed MRI due to claustrophobia. He says things at home are going well with Mom. They have a new dog! He hasnt gone for his Thyroid restudy yet.No sleep complaints He remains substance free   Visit Diagnosis:    ICD-10-CM   1. Sequelae, post-stroke  I69.30     2. 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 Hypersomnia with sleep apnea  G47.10    G47.30     4. Infarction of left basal ganglia (HCC)  I63.9     5. Multiple lacunar infarcts (HCC)  I63.81     6. Polysubstance dependence in early, early partial, sustained full, or sustained partial remission ([place])  F19.21     7. 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 Opioid dependence on maintenance agonist therapy, no symptoms (HCC)  F11.20     9. Alcohol use disorder, severe, in sustained remission (HCC)  F10.21     10. Sedative, hypnotic or anxiolytic use disorder, moderate, in sustained remission (HCC)  F13.21     11. Cocaine dependence in remission ([place])  F14.21     12. 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 Status post gamma knife treatment  Z92.3     14. Tardive dyskinesia  G24.01     15. Acquired hypothyroidism  E03.9     16. Benign prostatic hyperplasia with lower urinary tract symptoms, symptom details unspecified  N40.1     17. Rosacea  L71.9     18. Essential hypertension  I10     19. Familial tremor  G25.0     20. Medication  management  Z79.899       Past Psychiatric History:  Past Psychiatric History:  [place] Date of recent admission: 10/29/21 Date of recent discharge: 11/01/21    [facility] [place] [facility] [place] 6/17/-03/04/2020  Pt with Opiate dependence MAT Suboxone at [facility] Dr Lynnda Shields C/O of paranoia he is aware of but cant stop/completely control.Has rx for Risperdal from [employer]. Also hx of Gamma Knife tradiation fo benign brain tumor ?2018 Says has records Requesting Psychiatrist/medication mangement for paranoia hx [place] [facility] [place] 08/21/2021 - NOW [facility] INPATIENT  Date of Admission:  10/02/2021 Date of Discharge: 10/13/2021   [facility] Alcohol intoxication 03/26/2017  Alcohol withdrawal delirium 03/26/2017  Chronic daily headache 02/25/2017  Alcohol use disorder, severe, dependence 02/22/2017  Heroin dependence MAT Methadone [place]/Subutex TBR 01/2018  Anxiety disorder, unspecified 01/20/2017  Paranoia 03/2018    [facility] Unk Pinto, NP - 04/12/2017 12:38 PM EST Formatting of this note might be different from the original. [facility] - Substance Abuse PHP/IOP Intake Assessment  BH Formulate an individual and appropriate relapse prevention plan.   [place] SU Problem: Formation of a relapse prevention plan    [facility] Uncomplicated opioid  dependence (CMS/HCC) 12/24/2017  1. Discharge from hospital. 2. Follow up at [facility].                        [facility]([place])- Records requested-never     received             12/15/2007 -05/21/2010      CARE EVERYWHERE review   Date Type Department Care Team Description  11/12/2021 11:00 AM EDT Office Visit [place]   [place]   [place], [place] [id number]   [phone]   Ronnie Derby, PA-C   [place], [place] [address]    [phone] (Work)   [phone] (Fax)   Schizophrenia, unspecified type [facility]) (Primary Dx);  Benign neoplasm of brain, unspecified brain region [facility]);  Hospital discharge follow-up Discharge Disposition: Home or Self Care     Past Medical History:  Past Medical History:  Diagnosis Date   ADHD    Anxiety    Brain tumor ([place])    balance and occ memory issues and headaches   Brain tumor ([place])    sees [facility] q year   Depression    Headache    migraine and tension   Hypothyroidism    Soft tissue mass    left shoulder   Substance abuse ([place])    CARE EVERYWHERE REVIEW  MRI BRAIN WITHOUT CONTRAST, 10/29/2018 2:54 PM     COMPARISON: CT brain 02/17/2017, MR brain 04/15/2016 and prior.  FINDINGS:   Calvarium/skull base: No focal marrow replacing lesion suggestive of neoplasm.  Orbits: No focal mass.  Paranasal sinuses: No air-fluid levels or substantial mucosal disease.Mild mucosal thickening of the bilateral ethmoid air cells.  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 The mass again demonstrates internal susceptibility artifact, which could reflect calcification or hemosiderin. This mass appears to have decreased in size compared to a 2011 MR study where it measured 1.5 x 1.8 x 1 cm. Remote lacunar infarcts in the left basal ganglia. No evidence of acute abnormality. No significant white matter disease. No evidence of acute infarct. No mass effect, acute hemorrhage, or hydrocephalus. Grossly normal flow-related signal in the major intracranial arteries and dural sinuses.  CONCLUSION:  1.  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 No hydrocephalus. Attention on follow up imaging advised.  2.  To note, for future follow up imaging consider placing an IV line access before patient undergoes  contrast enhanced imaging.  3.  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 No detected  progression or bleeding since prior. No hydrocephalus. Chronic  lacune at the left caudate head and left putamen. No evidence of  acute infarct or hemorrhage. Vascular: No hyperdense vessel or unexpected calcification.  Skull: Normal. Negative for fracture or focal lesion.  Sinuses/Orbits: No acute finding.   IMPRESSION:  1. No acute finding or change from January 2022.  2. 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  3. Chronic lacunar infarct at  the left basal ganglia.  Past Surgical History:  Procedure Laterality Date   colonscopy     gamma knife radiation 2018 for brain tumor'     hematoma removed from arm Left    MASS EXCISION Left 06/30/2019   Procedure: EXCISION OF SOFT TISSUE  MASS LEFT SHOULDER;  Surgeon: Armandina Gemma, MD;  Location: [place];  Service: General;  Laterality: Left;  LMA    Family Psychiatric History: Alcoholism and drug abuse on father's side  Family History:  [place]  Hypertension Mother   Hypertension Father  Social History:  Social History               Marital status: Single      Spouse name: NA   Number of children: NA   Years of education: 16   Highest education level:    Occupational History   Not on file  Tobacco Use   Smoking status: Current Every Day Smoker      Types: Cigarettes   Smokeless tobacco: Never Used   Tobacco comment: quit Jan 2020  Vaping Use   Vaping Use: Never used  Substance and Sexual Activity   Alcohol use: Not Currently      Comment: quit oct 2019   Drug use: Yes      Types: Cocaine,Heroin  IV      Comment: last used OCT  2019 per pt Started MAT Buprenorphine   Sexual activity:  Not on file  Other Topics Concern      Social History Narrative         Emergency planning/management officer Strain:    Difficulty of Paying Living Expenses: Disability  Food Insecurity:    Worried About Charity fundraiser in the Last Year: On disability   Arboriculturist in the Last Year:   Transportation Needs:    Film/video editor (Medical): Uses Ecologist (Non-Medical):   Physical Activity:    Days of Exercise per Week:    Minutes of Exercise per Session:   Stress:    Feeling of Stress : Constant   Social Connections:    Frequency of Communication with Friends and Family: Mother recently kicked him out of house   Frequency of Social Gatherings with Friends and Family: Lives alone-   Attends Religious Services: No   Active Member of Clubs or Organizations: Not active with AA /NA   Attends Engineer, production     Allergies:  Allergies  Allergen Reactions   Ingrezza [Valbenazine Tosylate] Other (See Comments)    Severe tongue chewing at 80 mg Tolerates lower doses   Naloxone Palpitations    Pt report MAT Rx is Subutex/Buprenorphine only   Amphetamine-Dextroamphetamine Other (See Comments)    Metabolic Disorder Labs: Lab Results  Component Value Date   HGBA1C 5.0 10/03/2021   MPG 96.8 10/03/2021   No results found for: "PROLACTIN" Lab Results  Component Value Date   CHOL 191 10/03/2021   TRIG 92 10/03/2021   HDL 48 10/03/2021   CHOLHDL 4.0 10/03/2021   VLDL 18 10/03/2021   LDLCALC 125 (H) 10/03/2021   Lab Results  Component Value Date   TSH 0.417 (L) 11/10/2021   TSH 2.166 01/09/2018   EKG - Performed by Nursing  Narrative Performed by MUSE Ventricular Rate                   83        BPM  Atrial Rate                        83        BPM                  P-R Interval                       154       ms                    QRS Duration                       74        ms                    Q-T Interval                        360       ms                    QTC                                423       ms                    P Axis                             99        degrees              R Axis                             0         degrees              T Axis                             49        degrees               Sinus rhythm   Normal ECG   When compared with ECG of 16-Jan-2017 20:04,   No significant change was found   Confirmed by Gabrielle Dare [phone]) on 10/30/2021 7:25:19 AM  Procedure Note  Cheek, Gayleen [place], MD - 10/30/2021  Formatting of this note might be different from the original.  Ventricular Rate                   83        BPM                  Atrial Rate                        83        BPM                  P-R Interval                       154  ms                    QRS Duration                       74        ms                    Q-T Interval                       360       ms                    QTC                                423       ms                    P Axis                             99        degrees              R Axis                             0         degrees              T Axis                             49        degrees               Sinus rhythm   Normal ECG   When compared with ECG of 16-Jan-2017 20:04,   No significant change was found   Confirmed by Gabrielle Dare [phone]) on 10/30/2021 7:25:19 AM  Specimen Collected: 10/29/21 19:45   Performed by: MUSE Last Resulted: 10/30/21 07:25   Therapeutic Level Labs:NA  Current Medications: Current Outpatient Medications  Medication Sig Dispense Refill   Deutetrabenazine ER (AUSTEDO XR) 6 MG TB24 Take 6 mg by mouth daily. 30 tablet 0   buprenorphine (SUBUTEX) 8 MG SUBL SL tablet Place 24 mg under the tongue daily as needed.     diphenhydrAMINE (BENADRYL) 50 MG tablet Take 2-4 tablets q6h as needed for restlessness related to Haldol 100 tablet 2   gabapentin (NEURONTIN) 400 MG capsule Take 1  capsule (400 mg total) by mouth 2 (two) times daily for 180 doses. 180 capsule 0   haloperidol (HALDOL) 5 MG tablet Take 1 tablet (5 mg total) by mouth daily. 90 tablet 0   modafinil (PROVIGIL) 200 MG tablet Take 3 tablets (600 mg total) by mouth daily. 90 tablet 2   polyethylene glycol (MIRALAX / GLYCOLAX) 17 g packet Take 17 g by mouth daily. 30 each 0   propranolol (INDERAL) 20 MG tablet Take 20 mg by mouth 3 (three) times daily.     tamsulosin (FLOMAX) 0.4 MG CAPS capsule Take 2 capsules (0.8 mg total) by mouth daily after supper. 60 capsule 0   thyroid (ARMOUR THYROID) 120 MG tablet Take 1 tablet (120 mg total) by  mouth daily before breakfast. 90 tablet 0   traZODone (DESYREL) 100 MG tablet Take 1 tablet (100 mg total) by mouth at bedtime. 90 tablet 0   No current facility-administered medications for this visit.     Musculoskeletal: Strength & Muscle Tone: abnormal and increased Gait & Station: normal Patient leans: Front  Psychiatric Specialty Exam: Review of Systems  There were no vitals taken for this visit.There is no height or weight on file to calculate BMI.  General Appearance: Casual and Fairly Groomed  Eye Contact:  Good  Speech:  Clear and Coherent  Volume:  Normal  Mood:  Euthymic  Affect:  Appropriate and Congruent  Thought Process:  Coherent, Goal Directed, and Descriptions of Associations: Intact  Orientation:  Full (Time, Place, and Person)  Thought Content: WDL and Logical   Suicidal Thoughts:  No  Homicidal Thoughts:  No  Memory:   Periods of blackouts from addiction and Psychosis  Judgement:  Other:  improving  Insight:  Fair  Psychomotor Activity:  Increased and Restlessness  Concentration:  Concentration: Good and Attention Span: Good  Recall:  [place] of Knowledge:  WDL  Language: Good  Akathisia:  Yes  Handed:  Right  AIMS (if indicated): Unchanged  Assets:  Desire for Improvement Financial Resources/Insurance Housing Resilience Social  Support Talents/Skills Transportation Vocational/Educational  ADL's:  Intact  Cognition: Impaired,  Mild and Moderate  Sleep:   Improved   Screenings: [place] Office Visit from 11/27/2021 in [facility] Office Visit from 11/06/2021 in [facility] Visit from 10/23/2021 in [facility] Visit from 10/16/2021 in [facility] Admission (Discharged) from 10/02/2021 in [place] [facility]  AIMS Total Score '4 7 5 21 '$ 0      Flowsheet Row Admission (Discharged) from 10/02/2021 in [place] [facility] ED from 09/27/2021 in [place] DEPT  C-SSRS RISK CATEGORY High Risk High Risk        Assessment  Intolerant to [place] Asymptomatic psychiatrically on '5mg'$  Haldol  TDK continues to be a problem Claustrophobic to closed MRI-not a candidate for tranquilization with controlled substances due to tolerance    and Plan:  Try Austedo start with 6 mg XR x 7 days -Stop if ANY problems and use Benadryl as ordered (EKG normal) Fu 7 days Reschedule MRI in Open machine Continue Haldol at 5 mg -consider reducing to 2.5 mg Continue other meds Get repeat Thyroid study  [name], PA-C 12/11/2021, 4:51 PM

## 2021-12-18 ENCOUNTER — Encounter (HOSPITAL_COMMUNITY): Payer: Self-pay | Admitting: Medical

## 2021-12-18 ENCOUNTER — Ambulatory Visit (HOSPITAL_COMMUNITY): Payer: Medicare Other | Admitting: Medical

## 2021-12-18 DIAGNOSIS — F060 Psychotic disorder with hallucinations due to known physiological condition: Secondary | ICD-10-CM

## 2021-12-18 DIAGNOSIS — I10 Essential (primary) hypertension: Secondary | ICD-10-CM

## 2021-12-18 DIAGNOSIS — G4710 Hypersomnia, unspecified: Secondary | ICD-10-CM

## 2021-12-18 DIAGNOSIS — I6930 Unspecified sequelae of cerebral infarction: Secondary | ICD-10-CM

## 2021-12-18 DIAGNOSIS — N401 Enlarged prostate with lower urinary tract symptoms: Secondary | ICD-10-CM

## 2021-12-18 DIAGNOSIS — I639 Cerebral infarction, unspecified: Secondary | ICD-10-CM

## 2021-12-18 DIAGNOSIS — Z79899 Other long term (current) drug therapy: Secondary | ICD-10-CM

## 2021-12-18 DIAGNOSIS — E039 Hypothyroidism, unspecified: Secondary | ICD-10-CM

## 2021-12-18 DIAGNOSIS — G250 Essential tremor: Secondary | ICD-10-CM

## 2021-12-18 DIAGNOSIS — I6381 Other cerebral infarction due to occlusion or stenosis of small artery: Secondary | ICD-10-CM

## 2021-12-18 DIAGNOSIS — F1921 Other psychoactive substance dependence, in remission: Secondary | ICD-10-CM

## 2021-12-18 DIAGNOSIS — D330 Benign neoplasm of brain, supratentorial: Secondary | ICD-10-CM

## 2021-12-18 DIAGNOSIS — G2401 Drug induced subacute dyskinesia: Secondary | ICD-10-CM

## 2021-12-18 DIAGNOSIS — Z923 Personal history of irradiation: Secondary | ICD-10-CM

## 2021-12-18 NOTE — Progress Notes (Signed)
BH MD/PA/NP OP Progress Note  12/18/2021 5:18 PM Paul Bray  MRN:  [id number]  Chief Complaint:  Chief Complaint  Patient presents with   Follow-up   Addiction Problem   Lt basal ganglia strokes   Post stroke psychosis   Hypertension   Benign Prostatic Hypertrophy   Hypothyroidism   Medication Problem   Medication Reaction   Brain Tumor   Hypersomnia   HPI: Paul Bray returns for FU on his Polysubstance dependencies;Post Lt basal ganglia stroke with complications,TDK from Haldol with intolerance to new Austedo and Ingrezza. He took 1 dose of Austedo and had generalized akathisia. He has since been on Benadryl without difficulty. He does have a bottle of Cogentin whicih he has taken in past. He does not recall how effective it was-wants to try again. He has not gone for his FU Thyroid study.He reports feeling much better with increased dose. He is able to do an open MRI without sedation and this is being scheduled Ov3erall he reports being well. 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 He has not been to any meetings He continues to be free of hallucinations Visit Diagnosis:    ICD-10-CM   1. Sequelae, post-stroke  I69.30 MR ANGIO HEAD WO W CONTRAST    2. Infarction of left basal ganglia (HCC)  I63.9     3. Polysubstance dependence in early, early partial, sustained full, or sustained partial remission (Paul Bray)  F19.21     4. Multiple lacunar infarcts (HCC)  I63.81     5. 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 Status post gamma knife treatment  Z92.3 MR ANGIO HEAD WO W CONTRAST    Thyroid Panel With TSH    CANCELED: Thyroid Panel With TSH    7. 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 Hypersomnia with sleep apnea  G47.10    G47.30     9. Medication management  Z79.899     10. Familial tremor  G25.0     11. Tardive  dyskinesia  G24.01     12. Benign prostatic hyperplasia with lower urinary tract symptoms, symptom details unspecified  N40.1     13. Essential hypertension  I10     14. Hypothyroidism, unspecified type  E03.9     15. Persistent hypersomnia  G47.10       Past Psychiatric History:   Past Psychiatric History:  Paul Bray Date of recent admission: 10/29/21 Date of recent discharge: 11/01/21    [facility] [place] 6/17/-03/04/2020  Pt with Opiate dependence MAT Suboxone at [facility] Paul Bray C/O of paranoia he is aware of but cant stop/completely control.Has rx for Risperdal from Paul Bray. Also hx of Gamma Knife tradiation fo benign brain tumor ?2018 Says has records Requesting Psychiatrist/medication mangement for paranoia hx [facility]ray OP [facility] 08/21/2021 - NOW [facility] INPATIENT  Date of Admission:  10/02/2021 Date of Discharge: 10/13/2021   [facility] Alcohol intoxication 03/26/2017  Alcohol withdrawal delirium 03/26/2017  Chronic daily headache 02/25/2017  Alcohol use disorder, severe, dependence 02/22/2017  Heroin dependence MAT Methadone [place]/Subutex TBR 01/2018  Anxiety disorder, unspecified 01/20/2017  Paranoia 03/2018    [facility] Unk Pinto, NP - 04/12/2017 12:38 PM EST Formatting of this note might be different from the original. [facility] - Substance Abuse PHP/IOP Intake  Assessment  BH Formulate an individual and appropriate relapse prevention plan.   [place] [facility] SU Problem: Formation of a relapse prevention plan    [facility] Uncomplicated opioid dependence (CMS/HCC) 12/24/2017  1. Discharge from hospital. 2. Follow up at [facility].                        [facility]([place])- Records requested-never     received             12/15/2007 -05/21/2010     Past Medical History:  Past Medical History:  Diagnosis Date   ADHD    Anxiety    Brain  tumor (Paul Bray)    balance and occ memory issues and headaches   Brain tumor (Paul Bray)    sees [facility] q year   Depression    Headache    migraine and tension   Hypothyroidism    Soft tissue mass    left shoulder   Substance abuse (Paul Bray)      CARE EVERYWHERE REVIEW 12/18/2021 NO NEW INFO   MRI BRAIN WITHOUT CONTRAST, 10/29/2018 2:54 PM     COMPARISON: CT brain 02/17/2017, MR brain 04/15/2016 and prior.  FINDINGS:   Calvarium/skull base: No focal marrow replacing lesion suggestive of neoplasm.  Orbits: No focal mass.  Paranasal sinuses: No air-fluid levels or substantial mucosal disease.Mild mucosal thickening of the bilateral ethmoid air cells.  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 The mass again demonstrates internal susceptibility artifact, which could reflect calcification or hemosiderin. This mass appears to have decreased in size compared to a 2011 MR study where it measured 1.5 x 1.8 x 1 cm. Remote lacunar infarcts in the left basal ganglia. No evidence of acute abnormality. No significant white matter disease. No evidence of acute infarct. No mass effect, acute hemorrhage, or hydrocephalus. Grossly normal flow-related signal in the major intracranial arteries and dural sinuses.  CONCLUSION:  1.  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 No hydrocephalus. Attention on follow up imaging advised.  2.  To note, for future follow up imaging consider placing an IV line access before patient undergoes contrast enhanced imaging.  3.  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 No  detected  progression or bleeding since prior. No hydrocephalus. Chronic  lacune at the left caudate head and left putamen. No evidence of  acute infarct or hemorrhage. Vascular: No hyperdense vessel or unexpected calcification.  Skull: Normal. Negative for fracture or focal lesion.  Sinuses/Orbits: No acute finding.   IMPRESSION:  1. No acute finding or change from January 2022.  2. 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  3. Chronic lacunar infarct at the left basal gang Past Surgical History:  Procedure Laterality Date   colonscopy     gamma knife radiation 2018 for brain tumor'     hematoma removed from arm Left    MASS EXCISION Left 06/30/2019   Procedure: EXCISION OF SOFT TISSUE  MASS LEFT SHOULDER;  Surgeon: Armandina Gemma, MD;  Location: [facility];  Service:  General;  Laterality: Left;  LMA    Family Psychiatric History:  Alcoholism and drug abuse on father's side   Family History:  [place]  Hypertension Mother   Hypertension Father   Social History:  Social History             Marital status: Single      Spouse name: NA   Number of children: NA   Years of education: 16   Highest education level:    Occupational History   Not on file  Tobacco Use   Smoking status: Current Every Day Smoker      Types: Cigarettes   Smokeless tobacco: Never Used   Tobacco comment: quit Jan 2020  Vaping Use   Vaping Use: Never used  Substance and Sexual Activity   Alcohol use: Not Currently      Comment: quit oct 2019   Drug use: Yes      Types: Cocaine,Heroin  IV      Comment: last used OCT  2019 per pt Started MAT Buprenorphine   Sexual activity: Not on file  Other Topics Concern      Social History Narrative         Emergency planning/management officer Strain:    Difficulty of Paying Living Expenses: Disability  Food Insecurity:    Worried About Charity fundraiser in the Last Year: On disability   Arboriculturist in the Last Year:   Transportation  Needs:    Film/video editor (Medical): Uses Ecologist (Non-Medical):   Physical Activity:    Days of Exercise per Week:    Minutes of Exercise per Session:   Stress:    Feeling of Stress : Constant   Social Connections:    Frequency of Communication with Friends and Family: Mother recently kicked him out of house   Frequency of Social Gatherings with Friends and Family: Lives alone-   Attends Religious Services: No   Active Member of Clubs or Organizations: Not active with AA /NA   Attends Engineer, production    Allergies:  Allergies  Allergen Reactions   Ingrezza [Valbenazine Tosylate] Other (See Comments)    Severe tongue chewing at 80 mg Tolerates lower doses   Naloxone Palpitations    Pt report MAT Rx is Subutex/Buprenorphine only   Amphetamine-Dextroamphetamine Other (See Comments)    Metabolic Disorder Labs: Lab Results  Component Value Date   HGBA1C 5.0 10/03/2021   MPG 96.8 10/03/2021   No results found for: "PROLACTIN" Lab Results  Component Value Date   CHOL 191 10/03/2021   TRIG 92 10/03/2021   HDL 48 10/03/2021   CHOLHDL 4.0 10/03/2021   VLDL 18 10/03/2021   LDLCALC 125 (H) 10/03/2021   Lab Results  Component Value Date   TSH 0.417 (L) 11/10/2021   TSH 2.166 01/09/2018    Therapeutic Level Labs: No results found for: "LITHIUM" No results found for: "VALPROATE" No results found for: "CBMZ"  Current Medications: Current Outpatient Medications  Medication Sig Dispense Refill   buprenorphine (SUBUTEX) 8 MG SUBL SL tablet Place 24 mg under the tongue daily as needed.     Deutetrabenazine ER (AUSTEDO XR) 6 MG TB24 Take 6 mg by mouth daily. 30 tablet 0   diphenhydrAMINE (BENADRYL) 50 MG tablet Take 2-4 tablets q6h as needed for restlessness related to Haldol 100 tablet 2   gabapentin (NEURONTIN) 400 MG capsule Take 1 capsule (400 mg total) by mouth 2 (two)  times daily for 180 doses. 180 capsule 0   haloperidol  (HALDOL) 5 MG tablet Take 1 tablet (5 mg total) by mouth daily. 90 tablet 0   modafinil (PROVIGIL) 200 MG tablet Take 3 tablets (600 mg total) by mouth daily. 90 tablet 2   polyethylene glycol (MIRALAX / GLYCOLAX) 17 g packet Take 17 g by mouth daily. 30 each 0   propranolol (INDERAL) 20 MG tablet Take 20 mg by mouth 3 (three) times daily.     tamsulosin (FLOMAX) 0.4 MG CAPS capsule Take 2 capsules (0.8 mg total) by mouth daily after supper. 60 capsule 0   thyroid (ARMOUR THYROID) 120 MG tablet Take 1 tablet (120 mg total) by mouth daily before breakfast. 90 tablet 0   traZODone (DESYREL) 100 MG tablet Take 1 tablet (100 mg total) by mouth at bedtime. 90 tablet 0   No current facility-administered medications for this visit.     Musculoskeletal: Strength & Muscle Tone: within normal limits Gait & Station: normal Patient leans: Front  Psychiatric Specialty Exam: Review of Systems  Constitutional:  Negative for activity change, appetite change, chills, diaphoresis, fatigue, fever and unexpected weight change.  HENT: Negative.    Gastrointestinal: Negative.   Endocrine: Negative for cold intolerance, heat intolerance, polydipsia, polyphagia and polyuria.  Genitourinary: Negative.        BPH asymptomatic on meds  Musculoskeletal:  Negative for arthralgias, back pain, gait problem, joint swelling, myalgias, neck pain and neck stiffness.  Skin:  Positive for color change (Rosacea) and rash (Rosacea). Negative for pallor and wound.  Neurological:  Positive for tremors. Negative for dizziness, seizures, syncope, facial asymmetry, speech difficulty, weakness, light-headedness, numbness and headaches.  Hematological: Negative.   Psychiatric/Behavioral:  Negative for agitation, behavioral problems, confusion, decreased concentration, dysphoric mood, hallucinations, self-injury, sleep disturbance and suicidal ideas. The patient is not nervous/anxious and is not hyperactive.     There were no  vitals taken for this visit.There is no height or weight on file to calculate BMI.Deferred  General Appearance: Casual and Well Groomed  Eye Contact:  Good  Speech:  Paul and Coherent and Normal Rate  Volume:  Normal  Mood:  Anxious and Euthymic  Affect:  Appropriate and Congruent  Thought Process:  Coherent and Descriptions of Associations: Intact  Orientation:  Full (Time, Place, and Person)  Thought Content: WDL and Logical   Suicidal Thoughts:  No  Homicidal Thoughts:  No  Memory:   No change  Judgement:  Impaired  Insight:  Lacking  Psychomotor Activity:  Increased  Concentration:  Concentration: Good and Attention Span: Good  Recall:  [place] of Knowledge:  WDL  Language: Good  Akathisia:  Yes  Handed:  Right  AIMS (if indicated): not done  Assets:  Desire for Improvement Financial Resources/Insurance Housing Resilience Social Support Talents/Skills Transportation  ADL's:  Intact  Cognition: Impaired,  Moderate  Sleep:   No complaint   Screenings: [facility] Office Visit from 11/27/2021 in [facility] Visit from 11/06/2021 in [facility] Visit from 10[facility]23/2021 in [facility] Office Visit from 10/16/2021 in [facility] Admission (Discharged) from 10/02/2021 in [facility]  AIMS Total Score '4 7 5 21 '$ 0      Flowsheet Row Admission (Discharged) from 10/02/2021 in [facility] ED from 09/27/2021 in [facility]  CATEGORY High Risk High Risk        Assessment : Stable Intolerant to newer class of TDK meds Pending lab and MRI FU    and Plan: Reorder Thyroid panel and Open MRI.FU 1 week Trial of Cogentin   [name], PA-C 12/18/2021, 5:18 PM

## 2021-12-25 ENCOUNTER — Encounter (HOSPITAL_COMMUNITY): Payer: Self-pay | Admitting: Medical

## 2021-12-25 ENCOUNTER — Ambulatory Visit (HOSPITAL_BASED_OUTPATIENT_CLINIC_OR_DEPARTMENT_OTHER): Payer: Medicare Other | Admitting: Medical

## 2021-12-25 DIAGNOSIS — N401 Enlarged prostate with lower urinary tract symptoms: Secondary | ICD-10-CM

## 2021-12-25 DIAGNOSIS — Z8659 Personal history of other mental and behavioral disorders: Secondary | ICD-10-CM

## 2021-12-25 DIAGNOSIS — F1610 Hallucinogen abuse, uncomplicated: Secondary | ICD-10-CM

## 2021-12-25 DIAGNOSIS — F1021 Alcohol dependence, in remission: Secondary | ICD-10-CM

## 2021-12-25 DIAGNOSIS — I10 Essential (primary) hypertension: Secondary | ICD-10-CM

## 2021-12-25 DIAGNOSIS — I6930 Unspecified sequelae of cerebral infarction: Secondary | ICD-10-CM

## 2021-12-25 DIAGNOSIS — I639 Cerebral infarction, unspecified: Secondary | ICD-10-CM | POA: Diagnosis not present

## 2021-12-25 DIAGNOSIS — E039 Hypothyroidism, unspecified: Secondary | ICD-10-CM

## 2021-12-25 DIAGNOSIS — G250 Essential tremor: Secondary | ICD-10-CM

## 2021-12-25 DIAGNOSIS — F1120 Opioid dependence, uncomplicated: Secondary | ICD-10-CM

## 2021-12-25 DIAGNOSIS — G4710 Hypersomnia, unspecified: Secondary | ICD-10-CM

## 2021-12-25 DIAGNOSIS — F060 Psychotic disorder with hallucinations due to known physiological condition: Secondary | ICD-10-CM | POA: Diagnosis not present

## 2021-12-25 DIAGNOSIS — L719 Rosacea, unspecified: Secondary | ICD-10-CM

## 2021-12-25 DIAGNOSIS — G4730 Sleep apnea, unspecified: Secondary | ICD-10-CM

## 2021-12-25 DIAGNOSIS — F1321 Sedative, hypnotic or anxiolytic dependence, in remission: Secondary | ICD-10-CM

## 2021-12-25 DIAGNOSIS — I6381 Other cerebral infarction due to occlusion or stenosis of small artery: Secondary | ICD-10-CM

## 2021-12-25 DIAGNOSIS — F1921 Other psychoactive substance dependence, in remission: Secondary | ICD-10-CM

## 2021-12-25 DIAGNOSIS — D330 Benign neoplasm of brain, supratentorial: Secondary | ICD-10-CM

## 2021-12-25 DIAGNOSIS — F1421 Cocaine dependence, in remission: Secondary | ICD-10-CM

## 2021-12-25 DIAGNOSIS — G2401 Drug induced subacute dyskinesia: Secondary | ICD-10-CM

## 2021-12-25 DIAGNOSIS — Z923 Personal history of irradiation: Secondary | ICD-10-CM

## 2021-12-25 DIAGNOSIS — Z79899 Other long term (current) drug therapy: Secondary | ICD-10-CM

## 2021-12-25 MED ORDER — DOXYCYCLINE HYCLATE 50 MG PO CAPS: 50.0000 mg | ORAL_CAPSULE | Freq: Every day | ORAL | 1 refills | Status: DC

## 2021-12-25 NOTE — Progress Notes (Signed)
BH MD/PA/NP OP Progress Note  12/25/2021 3:23 PM Paul Bray  MRN:  [id number]  Chief Complaint:  Chief Complaint  Patient presents with   Follow-up   Addiction Problem   Hallucinosis S/P Lt Basal Ganglia Infarct   Hypothyroidism   Hypertension   Rosacea   Brain Tumor    Probable meningioma   HPI: Paul Bray returns for scheduled FU to monitor his TD and Haldol dosagwe as well as his chronic SUDs.  He reports that he is doing well and getting along with his mom much better.  He has had a flare of his Rosacea and has run out of Doxycycline rx. He is managing his Haldol TD with Benadryl effectively. He is ready to try a lower dose.  He has not gotten his Thyroid test yet. He has not heard about repeat MRI.  Visit Diagnosis:    ICD-10-CM   1. Sequelae, post-stroke  I69.30    Hallucinosis and hypersomnia    2. Infarction of left basal ganglia (HCC)  I63.9     3. Multiple lacunar infarcts (HCC)  I63.81     4. 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 Status post gamma knife treatment  Z92.3     8. Hypersomnia with sleep apnea  G47.10    G47.30     9. Medication management  Z79.899     10. Familial tremor  G25.0     11. Tardive dyskinesia  G24.01     12. Benign prostatic hyperplasia with lower urinary tract symptoms, symptom details unspecified  N40.1     13. Essential hypertension  I10     14. Persistent hypersomnia  G47.10     15. 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 Opioid dependence on maintenance agonist therapy, no symptoms (HCC)  F11.20     17. Alcohol use disorder, severe, in sustained remission ([place])  F10.21     18. Sedative, hypnotic or anxiolytic use disorder, moderate, in sustained remission (HCC)  F13.21     19. 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 Acquired hypothyroidism  E03.9     21. Rosacea  L71.9     22. History of ADHD  Z86.59         Past Psychiatric History:  Paul Bray Date of recent admission: 10/29/21 Date of recent discharge: 11/01/21    [facility] [place] [facility] [place] 6/17/-03/04/2020  Pt with Opiate dependence MAT Suboxone at [facility] Dr Lynnda Shields C/O of paranoia he is aware of but cant stop/completely control.Has rx for Risperdal from [employer]. Also hx of Gamma Knife tradiation fo benign brain tumor ?2018 Says has records Requesting Psychiatrist/medication mangement for paranoia hx [facility] [place] 08/21/2021 - NOW [facility] INPATIENT  Date of Admission:  10/02/2021 Date of Discharge: 10/13/2021   [facility] Alcohol intoxication 03/26/2017  Alcohol withdrawal delirium 03/26/2017  Chronic daily headache 02/25/2017  Alcohol use disorder, severe, dependence 02/22/2017  Heroin dependence MAT Methadone [place]/Subutex TBR 01/2018  Anxiety disorder, unspecified 01/20/2017  Paranoia 03/2018    [facility] Unk Pinto, NP - 04/12/2017 12:38 PM EST Formatting of this note might be different from the original. [facility] -  Substance Abuse PHP/IOP Intake Assessment  BH Formulate an individual and appropriate relapse prevention plan.   [place] SU Problem: Formation of a relapse prevention plan    [facility] Uncomplicated opioid dependence (CMS/HCC) 12/24/2017  1. Discharge from hospital. 2. Follow up at [facility].                        [facility]([place])- Records requested-never     received             12/15/2007 -05/21/2010     Past Medical History:  Past Medical History:  Diagnosis Date   ADHD    Anxiety    Brain tumor ([place])    balance and occ memory issues and headaches   Brain tumor ([place])    sees [facility] q year   Depression    Headache    migraine  and tension   Hypothyroidism    Soft tissue mass    left shoulder   Substance abuse ([place])    rCARE EVERYWHERE REVIEW 12/25/2021 NO NEW INFO    MRI BRAIN WITHOUT CONTRAST, 10/29/2018 2:54 PM     COMPARISON: CT brain 02/17/2017, MR brain 04/15/2016 and prior.  FINDINGS:   Calvarium/skull base: No focal marrow replacing lesion suggestive of neoplasm.  Orbits: No focal mass.  Paranasal sinuses: No air-fluid levels or substantial mucosal disease.Mild mucosal thickening of the bilateral ethmoid air cells.  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 The mass again demonstrates internal susceptibility artifact, which could reflect calcification or hemosiderin. This mass appears to have decreased in size compared to a 2011 MR study where it measured 1.5 x 1.8 x 1 cm. Remote lacunar infarcts in the left basal ganglia. No evidence of acute abnormality. No significant white matter disease. No evidence of acute infarct. No mass effect, acute hemorrhage, or hydrocephalus. Grossly normal flow-related signal in the major intracranial arteries and dural sinuses.  CONCLUSION:  1.  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 No hydrocephalus. Attention on follow up imaging advised.  2.  To note, for future follow up imaging consider placing an IV line access before patient undergoes contrast enhanced imaging.  3.  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 No detected  progression or bleeding since prior. No hydrocephalus. Chronic  lacune at the left caudate head and left putamen. No evidence of  acute  infarct or hemorrhage. Vascular: No hyperdense vessel or unexpected calcification.  Skull: Normal. Negative for fracture or focal lesion.  Sinuses/Orbits: No acute finding.   IMPRESSION:  1. No acute finding or change from January 2022.  2. 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  3. Chronic lacunar infarct at the left basal gang  Past Surgical History:  Procedure Laterality Date   colonscopy     gamma knife radiation 2018 for brain tumor'     hematoma removed from arm Left    MASS EXCISION Left 06/30/2019   Procedure: EXCISION OF SOFT TISSUE  MASS LEFT SHOULDER;  Surgeon: Armandina Gemma, MD;  Location: [facility];  Service: General;  Laterality: Left;  LMA    Family Psychiatric History:  Alcoholism and drug abuse on father's side   Family History:  [place]  Hypertension Mother   Hypertension Father  Social History:  Social History    Social Connections: Not on file    Marital status: Single      Spouse name: NA   Number of children: NA   Years of education: 16   Highest education level:    Occupational History   Not on file  Tobacco Use   Smoking status: Current Every Day Smoker      Types: Cigarettes   Smokeless tobacco: Never Used   Tobacco comment: quit Jan 2020  Vaping Use   Vaping Use: Never used  Substance and Sexual Activity   Alcohol use: Not Currently      Comment: quit oct 2019   Drug use: Yes      Types: Cocaine,Heroin  IV      Comment: last used OCT  2019 per pt Started MAT Buprenorphine   Sexual activity: Not on file  Other Topics Concern      Social History Narrative         Emergency planning/management officer Strain:    Difficulty of Paying Living Expenses: Disability  Food Insecurity:    Worried About Charity fundraiser in the Last Year: On disability   Arboriculturist in the Last Year:   Transportation Needs:    Film/video editor (Medical): Uses Ecologist (Non-Medical):   Physical Activity:     Days of Exercise per Week:    Minutes of Exercise per Session:   Stress:    Feeling of Stress : Constant   Social Connections:    Frequency of Communication with Friends and Family: Mother recently kicked him out of house   Frequency of Social Gatherings with Friends and Family: Lives alone-   Attends Religious Services: No   Active Member of Clubs or Organizations: Not active with AA /NA   Attends Engineer, production      Allergies:  Allergies  Allergen Reactions   Ingrezza [Valbenazine Tosylate] Other (See Comments)    Severe tongue chewing at 80 mg Tolerates lower doses   Naloxone Palpitations    Pt report MAT Rx is Subutex/Buprenorphine only   Amphetamine-Dextroamphetamine Other (See Comments)    Metabolic Disorder Labs: Lab Results  Component Value Date   HGBA1C 5.0 10/03/2021   MPG 96.8 10/03/2021   No results found for: "PROLACTIN" Lab Results  Component Value Date   CHOL 191 10/03/2021   TRIG 92 10/03/2021   HDL 48 10/03/2021   CHOLHDL 4.0 10/03/2021   VLDL 18 10/03/2021   LDLCALC 125 (H) 10/03/2021   Lab Results  Component Value Date   TSH 0.417 (L) 11/10/2021   TSH 2.166 01/09/2018    Therapeutic Level Labs: No results found for: "LITHIUM" No results found for: "VALPROATE" No results found for: "CBMZ"  Current Medications: Current Outpatient Medications  Medication Sig Dispense Refill   doxycycline (VIBRAMYCIN) 50 MG capsule Take 1 capsule (50 mg total) by mouth daily. 90 capsule 1   buprenorphine (SUBUTEX) 8 MG SUBL SL tablet Place 24 mg under the tongue daily as needed.     diphenhydrAMINE (BENADRYL) 50 MG tablet Take 2-4 tablets q6h as needed for restlessness related to Haldol 100 tablet 2   gabapentin (NEURONTIN) 400 MG capsule Take 1 capsule (400 mg  total) by mouth 2 (two) times daily for 180 doses. 180 capsule 0   haloperidol (HALDOL) 5 MG tablet Take 1 tablet (5 mg total) by mouth daily. 90 tablet 0   modafinil (PROVIGIL) 200 MG  tablet Take 3 tablets (600 mg total) by mouth daily. 90 tablet 2   polyethylene glycol (MIRALAX / GLYCOLAX) 17 g packet Take 17 g by mouth daily. 30 each 0   propranolol (INDERAL) 20 MG tablet Take 20 mg by mouth 3 (three) times daily.     tamsulosin (FLOMAX) 0.4 MG CAPS capsule Take 2 capsules (0.8 mg total) by mouth daily after supper. 60 capsule 0   thyroid (ARMOUR THYROID) 120 MG tablet Take 1 tablet (120 mg total) by mouth daily before breakfast. 90 tablet 0   traZODone (DESYREL) 100 MG tablet Take 1 tablet (100 mg total) by mouth at bedtime. 90 tablet 0   No current facility-administered medications for this visit.     Psychiatric Specialty Exam: Review of Systems  Constitutional:  Negative for activity change, appetite change, chills, diaphoresis, fatigue, fever and unexpected weight change.  Respiratory:  Negative for apnea, cough, choking, chest tightness, shortness of breath, wheezing and stridor.   Cardiovascular:  Negative for chest pain, palpitations and leg swelling.  Gastrointestinal: Negative.   Genitourinary:        BPH no complaints on medication  Musculoskeletal: Negative.   Skin:  Positive for rash (Rosacea needs Doxycycline refill). Negative for color change, pallor and wound.  Neurological:  Negative for dizziness, tremors, seizures, syncope, facial asymmetry, speech difficulty, weakness, light-headedness, numbness and headaches.  Hematological:  Negative for adenopathy. Does not bruise/bleed easily.  Psychiatric/Behavioral:  Negative for agitation, behavioral problems, confusion, decreased concentration, dysphoric mood, hallucinations, self-injury, sleep disturbance and suicidal ideas. The patient is not nervous/anxious and is not hyperactive.        Asymptomatic on meds    There were no vitals taken for this visit.There is no height or weight on file to calculate BMI.  General Appearance: Neat and Well Groomed  Eye Contact:  Good  Speech:  Clear and Coherent and  Normal Rate  Volume:  Normal  Mood:  Euthymic  Affect:  Appropriate and Congruent  Thought Process:  Coherent, Goal Directed, and Descriptions of Associations: Intact  Orientation:  Full (Time, Place, and Person)  Thought Content: WDL   Suicidal Thoughts:  No  Homicidal Thoughts:  No  Memory:   Unchanged   Immediate;   Fair Recent;   Fair Remote;   Fair  Judgement:  Impaired  Insight:  Lacking  Psychomotor Activity:  Normal  Concentration:  Concentration: Good and Attention Span: Good  Recall:  [place] of Knowledge:  WDL  Language: Good  Akathisia:   None seen today  Handed:  Right  AIMS (if indicated): Grossly negative today  Assets:  Desire for Improvement Financial Resources/Insurance Housing Resilience Social Support Talents/Skills Transportation Vocational/Educational  ADL's:  Impaired  Cognition: WNL  Sleep:   Rx meds   Screenings: [place] Office Visit from 11/27/2021 in [facility] Visit from 11/06/2021 in [facility] Visit from 10/23/2021 in [facility] Visit from 10/16/2021 in [facility] Admission (Discharged) from 10/02/2021 in [facility]  AIMS Total Score '4 7 5 21 '$ 0      Flowsheet Row Admission (Discharged) from 10/02/2021 in [facility] ED from 09/27/2021 in [facility] [facility]PT  C-SSRS RISK CATEGORY High Risk High Risk        Assessment: Marked improvement in appearance and behavior  Needs to get Thyroid rechecked. Needs FU on Open MRI Ready to try Haldol taper  Has adequate supply of meds except Doxycycline   and Plan:  Decrease Haldol to 2.5 mg daily-if psychosis returns increase dose back up IMMEDIATELY Get blood drawn for Thyroid FU Nurse will send MRI request to  [facility] as [place] has not responded Rx Doxycycline FU 1 week   [name], PA-C 12/25/2021, 3:23 PM

## 2022-01-01 ENCOUNTER — Ambulatory Visit (HOSPITAL_BASED_OUTPATIENT_CLINIC_OR_DEPARTMENT_OTHER): Payer: Medicare Other | Admitting: Medical

## 2022-01-01 ENCOUNTER — Ambulatory Visit (HOSPITAL_COMMUNITY): Payer: Medicare Other | Admitting: Medical

## 2022-01-01 ENCOUNTER — Other Ambulatory Visit (HOSPITAL_COMMUNITY): Payer: Self-pay | Admitting: *Deleted

## 2022-01-01 DIAGNOSIS — I6381 Other cerebral infarction due to occlusion or stenosis of small artery: Secondary | ICD-10-CM

## 2022-01-01 DIAGNOSIS — I6930 Unspecified sequelae of cerebral infarction: Secondary | ICD-10-CM | POA: Diagnosis not present

## 2022-01-01 DIAGNOSIS — Z8659 Personal history of other mental and behavioral disorders: Secondary | ICD-10-CM

## 2022-01-01 DIAGNOSIS — F1120 Opioid dependence, uncomplicated: Secondary | ICD-10-CM

## 2022-01-01 DIAGNOSIS — F1321 Sedative, hypnotic or anxiolytic dependence, in remission: Secondary | ICD-10-CM

## 2022-01-01 DIAGNOSIS — Z79899 Other long term (current) drug therapy: Secondary | ICD-10-CM

## 2022-01-01 DIAGNOSIS — Z923 Personal history of irradiation: Secondary | ICD-10-CM

## 2022-01-01 DIAGNOSIS — F060 Psychotic disorder with hallucinations due to known physiological condition: Secondary | ICD-10-CM | POA: Diagnosis not present

## 2022-01-01 DIAGNOSIS — D330 Benign neoplasm of brain, supratentorial: Secondary | ICD-10-CM

## 2022-01-01 DIAGNOSIS — F1610 Hallucinogen abuse, uncomplicated: Secondary | ICD-10-CM

## 2022-01-01 DIAGNOSIS — F1421 Cocaine dependence, in remission: Secondary | ICD-10-CM

## 2022-01-01 DIAGNOSIS — L719 Rosacea, unspecified: Secondary | ICD-10-CM

## 2022-01-01 DIAGNOSIS — E039 Hypothyroidism, unspecified: Secondary | ICD-10-CM

## 2022-01-01 DIAGNOSIS — I639 Cerebral infarction, unspecified: Secondary | ICD-10-CM | POA: Diagnosis not present

## 2022-01-01 DIAGNOSIS — G4710 Hypersomnia, unspecified: Secondary | ICD-10-CM

## 2022-01-01 DIAGNOSIS — G4730 Sleep apnea, unspecified: Secondary | ICD-10-CM

## 2022-01-01 DIAGNOSIS — G250 Essential tremor: Secondary | ICD-10-CM

## 2022-01-01 DIAGNOSIS — C719 Malignant neoplasm of brain, unspecified: Secondary | ICD-10-CM

## 2022-01-01 DIAGNOSIS — I10 Essential (primary) hypertension: Secondary | ICD-10-CM

## 2022-01-01 DIAGNOSIS — F1921 Other psychoactive substance dependence, in remission: Secondary | ICD-10-CM

## 2022-01-01 DIAGNOSIS — N401 Enlarged prostate with lower urinary tract symptoms: Secondary | ICD-10-CM

## 2022-01-01 DIAGNOSIS — F1021 Alcohol dependence, in remission: Secondary | ICD-10-CM

## 2022-01-01 MED ORDER — HYDROXYZINE PAMOATE 25 MG PO CAPS: 25.0000 mg | ORAL_CAPSULE | Freq: Three times a day (TID) | ORAL | 2 refills | Status: DC | PRN

## 2022-01-01 NOTE — Progress Notes (Signed)
BH MD/PA/NP OP Progress Note  01/01/2022 3:18 PM Paul Bray  MRN:  [id number]  Chief Complaint:  Chief Complaint  Patient presents with   Follow-up   Addiction Problem   Lt Basal ganglia Strokes   Post stroke Psychosis   Meningioma   Hypertension   Benign Prostatic Hypertrophy   Hypothyroidism   HPI: Paul Bray returns today for weekly FU of his Polysubstance abuse/dependencies and his post LT Basal Ganglia stroke related psychosis and Hypersomnia. After recent admission to [facility] he was put on Haldol which relieved his psychosis but created TD symptoms that proved difficult to control. He did not tolerate newer anti TD medications at all. Benadryl has proved to be efficacious along with tapering his dose of Haldol. He started at 10 mg but is now taking 2.5 mg daily without recurrence of hallucinations/psychosis and his tremors have abated. This past week he worked on his computer to establish a You Tube channel. His dog Paul Bray is doing well and Paul Bray get exercise walking him. He reports his relationship with his mom isn very good.He confesses he cannot live on his own at present ?maybe never. He has not gotten his blood drawn for Thyroid FU. They hjave heard nothing about repeat MRI since he was unable to tolerate the closed machine attempt He agrees to stay at 2.5 mg for next week and return for further assessment.. He saw his Suboxone Counselor 2 weeks ago and continues with this medication. He wants a refill of his Hydroxyzine  Visit Diagnosis:    ICD-10-CM   1. Sequelae, post-stroke  I69.30     2. Infarction of left basal ganglia (HCC)  I63.9     3. Multiple lacunar infarcts (HCC)  I63.81     4. 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 Polysubstance dependence in early, early partial, sustained full, or sustained partial remission ([place])  F19.21     6. 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 Status post  gamma knife treatment  Z92.3     8. Hypersomnia with sleep apnea  G47.10    G47.30     9. Medication management  Z79.899     10. Familial tremor  G25.0     11. Benign prostatic hyperplasia with lower urinary tract symptoms, symptom details unspecified  N40.1     12. Essential hypertension  I10     13. Persistent hypersomnia  G47.10     14. 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 Opioid dependence on maintenance agonist therapy, no symptoms (HCC)  F11.20     16. Alcohol use disorder, severe, in sustained remission (HCC)  F10.21     17. Sedative, hypnotic or anxiolytic use disorder, moderate, in sustained remission (HCC)  F13.21     18. 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 Acquired hypothyroidism  E03.9     20. Rosacea  L71.9     21. History of ADHD  Z86.59       Past Psychiatric History:  [place] Date of recent admission: 10/29/21 Date of recent discharge: 11/01/21    [facility] [place] 6/17/-03/04/2020  Pt with Opiate dependence MAT Suboxone at [facility] Dr Lynnda Shields C/O of paranoia he is aware of but cant stop/completely control.Has rx for Risperdal from [employer]. Also hx of Gamma Knife tradiation fo benign brain  tumor ?2018 Says has records Requesting Psychiatrist/medication mangement for paranoia hx [facility] [place] 08/21/2021 - NOW [facility] INPATIENT  Date of Admission:  10/02/2021 Date of Discharge: 10/13/2021   [facility] Alcohol intoxication 03/26/2017  Alcohol withdrawal delirium 03/26/2017  Chronic daily headache 02/25/2017  Alcohol use disorder, severe, dependence 02/22/2017  Heroin dependence MAT Methadone [place]/Subutex TBR 01/2018  Anxiety disorder, unspecified 01/20/2017  Paranoia 03/2018    [facility] Unk Pinto, NP - 04/12/2017 12:38 PM EST Formatting of this note might be different from the original. [facility] - Substance Abuse PHP/IOP Intake Assessment  BH  Formulate an individual and appropriate relapse prevention plan.   [place] SU Problem: Formation of a relapse prevention plan    [facility] Uncomplicated opioid dependence (CMS/HCC) 12/24/2017  1. Discharge from hospital. 2. Follow up at [facility].                        [facility]([place])- Records requested-never     received             12/15/2007 -05/21/2010   Past Medical History:   01/01/2022 CARE EVERYWHERE REVIEW No new entries/visits  Past Medical History:  Diagnosis Date   ADHD    Anxiety    Brain tumor ([place])    balance and occ memory issues and headaches   Brain tumor ([place])    sees [facility] q year   Depression    Headache    migraine and tension   Hypothyroidism    Soft tissue mass    left shoulder   Substance abuse ([place])    MRI BRAIN WITHOUT CONTRAST, 10/29/2018 2:54 PM     COMPARISON: CT brain 02/17/2017, MR brain 04/15/2016 and prior.  FINDINGS:   Calvarium/skull base: No focal marrow replacing lesion suggestive of neoplasm.  Orbits: No focal mass.  Paranasal sinuses: No air-fluid levels or substantial mucosal disease.Mild mucosal thickening of the bilateral ethmoid air cells.  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 The mass again demonstrates internal susceptibility artifact, which could reflect calcification or hemosiderin. This mass appears to have decreased in size compared to a 2011 MR study where it measured 1.5 x 1.8 x 1 cm. Remote lacunar infarcts in the left basal ganglia. No evidence of acute abnormality. No significant white matter disease. No evidence of acute infarct. No mass effect, acute hemorrhage, or hydrocephalus. Grossly normal flow-related signal in the major intracranial arteries and dural sinuses.  CONCLUSION:  1.  Right intraventricular  mass appears to have slightly decreased in size since 2011. No hydrocephalus. Attention on follow up imaging advised.  2.  To note, for future follow up imaging consider placing an IV line access before patient undergoes contrast enhanced imaging.  3.  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 No detected  progression or bleeding since prior. No hydrocephalus. Chronic  lacune at the left caudate head and left putamen. No evidence of  acute infarct or hemorrhage. Vascular: No hyperdense vessel or unexpected calcification.  Skull: Normal. Negative for fracture or focal lesion.  Sinuses/Orbits:  No acute finding.   IMPRESSION:  1. No acute finding or change from January 2022.  2. 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  3. Chronic lacunar infarct at the left basal gang  Past Surgical History:  Procedure Laterality Date   colonscopy     gamma knife radiation 2018 for brain tumor'     hematoma removed from arm Left    MASS EXCISION Left 06/30/2019   Procedure: EXCISION OF SOFT TISSUE  MASS LEFT SHOULDER;  Surgeon: Armandina Gemma, MD;  Location: [place];  Service: General;  Laterality: Left;  LMA    Family Psychiatric History:  Alcoholism and drug abuse on father's side   Family History: [place]  Hypertension Mother   Hypertension Father  Social History:           Marital status: Single      Spouse name: NA   Number of children: NA   Years of education: 16   Highest education level:    Occupational History   Not on file  Tobacco Use   Smoking status: Current Every Day Smoker      Types: Cigarettes   Smokeless tobacco: Never Used   Tobacco comment: quit Jan 2020  Vaping Use   Vaping Use: Never used  Substance and Sexual Activity    Alcohol use: Not Currently      Comment: quit oct 2019   Drug use: Yes      Types: Cocaine,Heroin  IV      Comment: last used OCT  2019 per pt Started MAT Buprenorphine   Sexual activity: Not on file  Other Topics Concern      Social History Narrative   Emergency planning/management officer Strain:    Difficulty of Paying Living Expenses: Disability  Food Insecurity:    Worried About Charity fundraiser in the Last Year: On disability   Arboriculturist in the Last Year:   Transportation Needs:    Film/video editor (Medical): Uses Ecologist (Non-Medical):   Physical Activity:    Days of Exercise per Week:    Minutes of Exercise per Session:   Stress:    Feeling of Stress : Constant   Social Connections:    Frequency of Communication with Friends and Family: Mother recently kicked him out of house   Frequency of Social Gatherings with Friends and Family: Lives alone-   Attends Religious Services: No   Active Member of Clubs or Organizations: Not active with AA /NA   Attends Engineer, production     Allergies:  Allergies  Allergen Reactions   Ingrezza [Valbenazine Tosylate] Other (See Comments)    Severe tongue chewing at 80 mg Tolerates lower doses   Naloxone Palpitations    Pt report MAT Rx is Subutex/Buprenorphine only   Amphetamine-Dextroamphetamine Other (See Comments)    Metabolic Disorder Labs: Lab Results  Component Value Date   HGBA1C 5.0 10/03/2021   MPG 96.8 10/03/2021   No results found for: "PROLACTIN" Lab Results  Component Value Date   CHOL 191 10/03/2021   TRIG 92 10/03/2021   HDL 48 10/03/2021   CHOLHDL 4.0 10/03/2021   VLDL 18 10/03/2021   LDLCALC 125 (H) 10/03/2021   Lab Results  Component Value Date   TSH 0.417 (L) 11/10/2021   TSH 2.166 01/09/2018    Therapeutic Level Labs:NA  Current Outpatient Medications  Medication Sig Dispense Refill   buprenorphine (SUBUTEX) 8 MG SUBL  SL tablet Place 24 mg under the  tongue daily as needed.     diphenhydrAMINE (BENADRYL) 50 MG tablet Take 2-4 tablets q6h as needed for restlessness related to Haldol 100 tablet 2   doxycycline (VIBRAMYCIN) 50 MG capsule Take 1 capsule (50 mg total) by mouth daily. 90 capsule 1   gabapentin (NEURONTIN) 400 MG capsule Take 1 capsule (400 mg total) by mouth 2 (two) times daily for 180 doses. 180 capsule 0   haloperidol (HALDOL) 5 MG tablet Take 1 tablet (5 mg total) by mouth daily. 90 tablet 0   modafinil (PROVIGIL) 200 MG tablet Take 3 tablets (600 mg total) by mouth daily. 90 tablet 2   polyethylene glycol (MIRALAX / GLYCOLAX) 17 g packet Take 17 g by mouth daily. 30 each 0   propranolol (INDERAL) 20 MG tablet Take 20 mg by mouth 3 (three) times daily.     tamsulosin (FLOMAX) 0.4 MG CAPS capsule Take 2 capsules (0.8 mg total) by mouth daily after supper. 60 capsule 0   thyroid (ARMOUR THYROID) 120 MG tablet Take 1 tablet (120 mg total) by mouth daily before breakfast. 90 tablet 0   traZODone (DESYREL) 100 MG tablet Take 1 tablet (100 mg total) by mouth at bedtime. 90 tablet 0   No current facility-administered medications for this visit.     Musculoskeletal: Strength & Muscle Tone: within normal limits Gait & Station: normal Patient leans: N/A  Psychiatric Specialty Exam: Review of Systems  Constitutional:  Negative for activity change, appetite change, chills, diaphoresis, fatigue, fever and unexpected weight change.  Endocrine: Negative for cold intolerance, heat intolerance, polydipsia, polyphagia and polyuria.  Genitourinary:  Negative for decreased urine volume, difficulty urinating, dysuria, enuresis, flank pain, frequency, genital sores, hematuria, penile discharge, penile pain, penile swelling, scrotal swelling, testicular pain and urgency.  Neurological:  Positive for tremors (Improved). Negative for dizziness, seizures, syncope, facial asymmetry, speech difficulty, light-headedness, numbness and headaches.   Hematological:  Negative for adenopathy. Does not bruise/bleed easily.  Psychiatric/Behavioral:  Negative for agitation, behavioral problems, confusion, decreased concentration, dysphoric mood, hallucinations, self-injury, sleep disturbance and suicidal ideas. The patient is not nervous/anxious and is not hyperactive.     There were no vitals taken for this visit.There is no height or weight on file to calculate BMI.  General Appearance: Casual and Well Groomed  Eye Contact:  Good  Speech:  Clear and Coherent  Volume:  Normal  Mood:  Euthymic  Affect:  Appropriate and Congruent  Thought Process:  Coherent, Goal Directed, and Descriptions of Associations: Intact  Orientation:  Full (Time, Place, and Person)  Thought Content: WDL and Logical   Suicidal Thoughts:  No  Homicidal Thoughts:  No  Memory:  Unchanged    Periods of blackouts from addiction and Psychosis Immediate;   Fair Recent;   Fair Remote;   Fair  Judgement:  Impaired  Insight:  Lacking  Psychomotor Activity:  Normal and No Akathasia/TDK  Concentration:  Concentration: Good and Attention Span: Good  Recall:  Good  Fund of Knowledge:  WDL  Language: Good  Akathisia:  No  Handed:  Right  AIMS (if indicated): Grossly normal  Assets:  Desire for Improvement Financial Resources/Insurance Housing Leisure Time Resilience Social Support Talents/Skills Transportation  ADL's:  Intact  Cognition: Impaired,  Mild  Sleep:   No complaints of Hypersomnia   Screenings: [place] Office Visit from 11/27/2021 in [facility] Visit from 11/06/2021 in [place]  [place] Office Visit from 10/23/2021 in [place] [facility] Office Visit from 10/16/2021 in [place] [facility] Admission (Discharged) from 10/02/2021 in [place] [facility]  AIMS Total Score '4 7 5  21 '$ 0      Flowsheet Row Admission (Discharged) from 10/02/2021 in [place] [facility] ED from 09/27/2021 in [place] DEPT  C-SSRS RISK CATEGORY High Risk High Risk      Assessment :  Stable with improved Muscular function. Still needs Thyroid FU (says hewill go) No word from MRI requests per Nurse Out of [place]  and Plan:  FU 1 week -? further reduction in Haldol? Reorder MRI at [place] he get Thyroid Labs done    [name], PA-C 01/01/2022, 3:18 PM

## 2022-01-08 ENCOUNTER — Encounter (HOSPITAL_COMMUNITY): Payer: Self-pay | Admitting: Medical

## 2022-01-08 ENCOUNTER — Telehealth (HOSPITAL_BASED_OUTPATIENT_CLINIC_OR_DEPARTMENT_OTHER): Payer: Medicare Other | Admitting: Medical

## 2022-01-08 DIAGNOSIS — G4710 Hypersomnia, unspecified: Secondary | ICD-10-CM

## 2022-01-08 DIAGNOSIS — I6930 Unspecified sequelae of cerebral infarction: Secondary | ICD-10-CM

## 2022-01-08 DIAGNOSIS — F1021 Alcohol dependence, in remission: Secondary | ICD-10-CM

## 2022-01-08 DIAGNOSIS — G4730 Sleep apnea, unspecified: Secondary | ICD-10-CM

## 2022-01-08 DIAGNOSIS — F1610 Hallucinogen abuse, uncomplicated: Secondary | ICD-10-CM

## 2022-01-08 DIAGNOSIS — D330 Benign neoplasm of brain, supratentorial: Secondary | ICD-10-CM

## 2022-01-08 DIAGNOSIS — G2401 Drug induced subacute dyskinesia: Secondary | ICD-10-CM

## 2022-01-08 DIAGNOSIS — F1910 Other psychoactive substance abuse, uncomplicated: Secondary | ICD-10-CM

## 2022-01-08 DIAGNOSIS — G4711 Idiopathic hypersomnia with long sleep time: Secondary | ICD-10-CM

## 2022-01-08 DIAGNOSIS — I10 Essential (primary) hypertension: Secondary | ICD-10-CM

## 2022-01-08 DIAGNOSIS — I6381 Other cerebral infarction due to occlusion or stenosis of small artery: Secondary | ICD-10-CM

## 2022-01-08 DIAGNOSIS — F1911 Other psychoactive substance abuse, in remission: Secondary | ICD-10-CM

## 2022-01-08 DIAGNOSIS — F060 Psychotic disorder with hallucinations due to known physiological condition: Secondary | ICD-10-CM

## 2022-01-08 DIAGNOSIS — G250 Essential tremor: Secondary | ICD-10-CM

## 2022-01-08 DIAGNOSIS — Z923 Personal history of irradiation: Secondary | ICD-10-CM

## 2022-01-08 DIAGNOSIS — N401 Enlarged prostate with lower urinary tract symptoms: Secondary | ICD-10-CM

## 2022-01-08 DIAGNOSIS — L719 Rosacea, unspecified: Secondary | ICD-10-CM

## 2022-01-08 DIAGNOSIS — F1321 Sedative, hypnotic or anxiolytic dependence, in remission: Secondary | ICD-10-CM

## 2022-01-08 DIAGNOSIS — E039 Hypothyroidism, unspecified: Secondary | ICD-10-CM

## 2022-01-08 DIAGNOSIS — F1921 Other psychoactive substance dependence, in remission: Secondary | ICD-10-CM

## 2022-01-08 DIAGNOSIS — F1421 Cocaine dependence, in remission: Secondary | ICD-10-CM

## 2022-01-08 DIAGNOSIS — Z8659 Personal history of other mental and behavioral disorders: Secondary | ICD-10-CM

## 2022-01-08 DIAGNOSIS — Z79899 Other long term (current) drug therapy: Secondary | ICD-10-CM

## 2022-01-08 DIAGNOSIS — F1120 Opioid dependence, uncomplicated: Secondary | ICD-10-CM

## 2022-01-08 DIAGNOSIS — I639 Cerebral infarction, unspecified: Secondary | ICD-10-CM

## 2022-01-08 MED ORDER — HALOPERIDOL 2 MG PO TABS: 2.0000 mg | ORAL_TABLET | Freq: Every day | ORAL | 0 refills | Status: DC

## 2022-01-08 NOTE — Progress Notes (Signed)
[place] MD/PA/NP OP Progress Note  01/08/2022 3:11 PM Paul Bray  MRN:  [id number] Virtual Visit via Video Note  I connected with Paul Bray on 01/08/22 at  3:00 PM EDT by a video enabled telemedicine application and verified that I am speaking with the correct person using two identifiers.  Location: Patient: At home Provider: [facility]   I discussed the limitations of evaluation and management by telemedicine and the availability of in person appointments. The patient expressed understanding and agreed to proceed.   History of Present Illness:See EPIC note    Observations/Objective:See EPIC note   Assessment and Plan:See EPIC note   Follow Up Instructions:See EPIC note    I discussed the assessment and treatment plan with the patient. The patient was provided an opportunity to ask questions and all were answered. The patient agreed with the plan and demonstrated an understanding of the instructions.   The patient was advised to call back or seek an in-person evaluation if the symptoms worsen or if the condition fails to improve as anticipated.  I provided 20 minutes of non-face-to-face time during this encounter.   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 He failed to respond to Zyprexa.Prior to this he had been on Risperdal from his Suboxone provder again without control of hisauditory hallucinations.  He was begun on 20 mg of Haldol and had a full response but developed TD. Trial of newer anti TD meds worsened his condition. He responded well to Benadryl and taper of his Haldol.  At last visit hade no visible signs of akathasia and or TD. He is currently at 2.5 mg of Haldol with no recurrence of hallucinations. His hypersomnia is at max dose of Modanafil although he would like to increase this. He is living with his mother and their relationship has steadily become less volatile. 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 He remains clean and sober in his MAT [facility]one program at [facility] He is scheduled to have his FU MRI to address his concerns that the tumor has grown.    Visit Diagnosis:    ICD-10-CM   1. Sequelae, post-stroke  I69.30     2. Multiple lacunar infarcts (HCC)  I63.81     3. 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 Status post gamma knife treatment  Z92.3     5. Infarction of left basal ganglia (HCC)  I63.9     6. 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 Polysubstance dependence in early, early partial, sustained full, or sustained partial remission ([place])  F19.21     8. Hypersomnia with sleep apnea  G47.10    G47.30     9. Medication management  Z79.899     10. Familial tremor  G25.0     11. Benign prostatic hyperplasia with lower urinary tract symptoms, symptom details unspecified  N40.1     12. Essential  hypertension  I10     13. 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 Opioid dependence on maintenance agonist therapy, no symptoms (HCC)  F11.20     15. Alcohol use disorder, severe, in sustained remission (HCC)  F10.21     16. Sedative, hypnotic or anxiolytic use disorder, moderate, in sustained remission (HCC)  F13.21     17. Cocaine dependence in remission ([place])  F14.21     18. Acquired hypothyroidism  E03.9     19. Rosacea  L71.9     20. History of ADHD  Z86.59     21. Tardive dyskinesia  G24.01     22. Idiopathic hypersomnia with long sleep time  G47.11       Past  Psychiatric History:  [place] Date of recent admission: 10/29/21 Date of recent discharge: 11/01/21    [facility] [place] [facility] [place] 6/17/-03/04/2020  Pt with Opiate dependence MAT Suboxone at [facility] Lynnda Shields C/O of paranoia he is aware of but cant stop/completely control.Has rx for Risperdal from [employer] [employer]. Also hx of Gamma Knife tradiation fo benign brain tumor ?2018 Says has records Requesting Psychiatrist/medication mangement for paranoia hx [facility] [place] 08/21/2021 - NOW [facility] INPATIENT  Date of Admission:  10/02/2021 Date of Discharge: 10/13/2021   [facility] Alcohol intoxication 03/26/2017  Alcohol withdrawal delirium 03/26/2017  Chronic daily headache 02/25/2017  Alcohol use disorder, severe, dependence 02/22/2017  Heroin dependence MAT Methadone [place]/Subutex TBR 01/2018  Anxiety disorder, unspecified 01/20/2017  Paranoia 03/2018    [facility] Unk Pinto, NP - 04/12/2017 12:38 PM EST Formatting of this note might be different from the original. [facility] Psychiatry - Substance Abuse PHP/IOP Intake Assessment  BH Formulate an individual and appropriate relapse prevention plan.   [place] SU Problem: Formation of a relapse prevention plan    [facility] Uncomplicated opioid dependence (CMS/HCC) 12/24/2017  1. Discharge from hospital. 2. Follow up at [facility].                        [facility])- Records requested-never     received             12/15/2007 -05/21/2010   Past Medical History:   CARE EVERYWHERE REVIEW No new entries/visits  Past Medical History:  Diagnosis Date   ADHD    Anxiety    Brain tumor ([place])    balance and occ memory issues and headaches   Brain tumor ([place])    sees [facility] q year   Depression    Headache    migraine and tension   Hypothyroidism    Soft tissue mass    left shoulder   Substance abuse ([place])     MRI  BRAIN WITHOUT CONTRAST, 10/29/2018 2:54 PM     COMPARISON: CT brain 02/17/2017, MR brain 04/15/2016 and prior.  FINDINGS:   Calvarium/skull base: No focal marrow replacing lesion suggestive of neoplasm.  Orbits: No focal mass.  Paranasal sinuses: No air-fluid levels or substantial mucosal disease.Mild mucosal thickening of the bilateral ethmoid air cells.  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 The mass again demonstrates internal susceptibility artifact, which could reflect calcification or hemosiderin. This mass appears to have decreased in size compared to  a 2011 MR study where it measured 1.5 x 1.8 x 1 cm. Remote lacunar infarcts in the left basal ganglia. No evidence of acute abnormality. No significant white matter disease. No evidence of acute infarct. No mass effect, acute hemorrhage, or hydrocephalus. Grossly normal flow-related signal in the major intracranial arteries and dural sinuses.  CONCLUSION:  1.  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 No hydrocephalus. Attention on follow up imaging advised.  2.  To note, for future follow up imaging consider placing an IV line access before patient undergoes contrast enhanced imaging.  3.  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 No detected  progression or bleeding since prior. No hydrocephalus. Chronic  lacune at the left caudate head and left putamen. No evidence of  acute infarct or hemorrhage. Vascular: No hyperdense vessel or unexpected calcification.  Skull: Normal. Negative for fracture or focal lesion.  Sinuses/Orbits:  No acute finding.   IMPRESSION:  1. No acute finding or change from January 2022.  2. 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  3. Chronic lacunar infarct at the left basal gang       Past Surgical History:  Procedure Laterality Date   colonscopy       gamma knife radiation 2018 for brain tumor'       hematoma removed from arm Left     MASS EXCISION Left 06/30/2019    Procedure: EXCISION OF SOFT TISSUE  MASS LEFT SHOULDER;  Surgeon: Armandina Gemma, MD;  Location: [place];  Service: General;  Laterality: Left;  LMA    Past Surgical History:  Procedure Laterality Date   colonscopy     gamma knife radiation 2018 for brain tumor'     hematoma removed from arm Left    MASS EXCISION Left 06/30/2019   Procedure: EXCISION OF SOFT TISSUE  MASS LEFT SHOULDER;  Surgeon: Armandina Gemma, MD;  Location: [place];  Service: General;  Laterality: Left;  LMA    Family Psychiatric History:  Alcoholism and drug abuse on father's side  Family History:  [place]  Hypertension Mother   Hypertension Father   Social History:  Social History    Marital status: Single      Spouse name: NA   Number of children: NA   Years of education: 16   Highest education level:    Occupational History   Not on file  Tobacco Use   Smoking status: Current Every Day Smoker      Types: Cigarettes   Smokeless tobacco: Never Used   Tobacco comment: quit Jan 2020  Vaping Use   Vaping Use: Never used  Substance and Sexual Activity   Alcohol use: Not Currently      Comment: quit oct 2019   Drug use: Yes      Types: Cocaine,Heroin  IV      Comment: last used OCT  2019 per pt Started MAT Buprenorphine   Sexual activity: Not on file  Other Topics Concern      Social History Narrative       Emergency planning/management officer Strain:    Difficulty of Paying Living Expenses: Disability  Food Insecurity:    Worried About [place] in the Last Year: On  disability   Arts development officer in the Last Year:   Transportation Needs:    Film/video editor (Medical): Uses Ecologist (Non-Medical):   Physical Activity:    Days of Exercise per Week:    Minutes of Exercise per Session:   Stress:    Feeling of Stress : Constant   Social Connections:    Frequency of Communication with Friends and Family: Mother recently kicked him out of house   Frequency of Social Gatherings with Friends and Family: Lives alone-   Attends Religious Services: No   Active Member of Clubs or Organizations: Not active with AA /NA   Attends Engineer, production     Allergies:  Allergies  Allergen Reactions   Ingrezza [Valbenazine Tosylate] Other (See Comments)    Severe tongue chewing at 80 mg Tolerates lower doses   Naloxone Palpitations    Pt report MAT Rx is Subutex/Buprenorphine only   Amphetamine-Dextroamphetamine Other (See Comments)    Metabolic Disorder Labs: Lab Results  Component Value Date   HGBA1C 5.0 10/03/2021   MPG 96.8 10/03/2021   No results found for: "PROLACTIN" Lab Results  Component Value Date   CHOL 191 10/03/2021   TRIG 92 10/03/2021   HDL 48 10/03/2021   CHOLHDL 4.0 10/03/2021   VLDL 18 10/03/2021   LDLCALC 125 (H) 10/03/2021   Lab Results  Component Value Date   TSH 0.417 (L) 11/10/2021   TSH 2.166 01/09/2018    Therapeutic Level Labs:NA  Current Medications: Current Outpatient Medications  Medication Sig Dispense Refill   haloperidol (HALDOL) 2 MG tablet Take 1 tablet (2 mg total) by mouth daily for 30 doses. 30 tablet 0   buprenorphine (SUBUTEX) 8 MG SUBL SL tablet Place 24 mg under the tongue daily as needed.     diphenhydrAMINE (BENADRYL) 50 MG tablet Take 2-4 tablets q6h as needed for restlessness related to Haldol 100 tablet 2   doxycycline (VIBRAMYCIN) 50 MG capsule Take 1 capsule (50 mg total) by mouth daily. 90 capsule 1   gabapentin (NEURONTIN) 400 MG capsule Take 1 capsule (400 mg total) by  mouth 2 (two) times daily for 180 doses. 180 capsule 0   hydrOXYzine (VISTARIL) 25 MG capsule Take 1 capsule (25 mg total) by mouth 3 (three) times daily as needed for anxiety. 90 capsule 2   modafinil (PROVIGIL) 200 MG tablet Take 3 tablets (600 mg total) by mouth daily. 90 tablet 2   polyethylene glycol (MIRALAX / GLYCOLAX) 17 g packet Take 17 g by mouth daily. 30 each 0   propranolol (INDERAL) 20 MG tablet Take 20 mg by mouth 3 (three) times daily.     tamsulosin (FLOMAX) 0.4 MG CAPS capsule Take 2 capsules (0.8 mg total) by mouth daily after supper. 60 capsule 0   thyroid (ARMOUR THYROID) 120 MG tablet Take 1 tablet (120 mg total) by mouth daily before breakfast. 90 tablet 0   traZODone (DESYREL) 100 MG tablet Take 1 tablet (100 mg total) by mouth at bedtime. 90 tablet 0   No current facility-administered medications for this visit.     Musculoskeletal: Strength & Muscle Tone: Telepsych visit-Grossly normal Musculoskeletal and cranial nerve inspections Gait & Station: NA Patient leans: N/A  Psychiatric Specialty Exam: Review of Systems  Constitutional:  Negative for activity change, appetite change, chills, diaphoresis, fatigue, fever and unexpected weight change.  Respiratory: Negative.    Cardiovascular: Negative.   Genitourinary: Negative.   Neurological:  Negative for dizziness, tremors, seizures, syncope, facial asymmetry, speech difficulty, weakness, light-headedness, numbness and headaches.  Hematological:  Negative for adenopathy. Does not bruise/bleed easily.  Psychiatric/Behavioral:  Negative for agitation, behavioral problems, confusion, decreased concentration, dysphoric mood, hallucinations, self-injury, sleep disturbance and suicidal ideas. The patient is not nervous/anxious and is not hyperactive.     There were no vitals taken for this visit.There is no height or weight on file to calculate BMI.My Chart visit  General Appearance: Neat  Eye Contact:  Good  Speech:   Clear and Coherent  Volume:  Normal  Mood:  Euthymic  Affect:  Appropriate and Congruent  Thought Process:  Coherent, Goal Directed, and Descriptions of Associations: Intact  Orientation:  Full (Time, Place, and Person)  Thought Content: WDL and Logical   Suicidal Thoughts:  No  Homicidal Thoughts:  No  Memory:   Affected by addictions and strokes  Judgement:  Fair  Insight:  Fair  Psychomotor Activity:  NA  Concentration:  Concentration: Good and Attention Span: Good  Recall:   see memory  Fund of Knowledge:  WDL  Language: Good  Akathisia:  Negative  Handed:  Right  AIMS (if indicated): not done  Assets:  Desire for Improvement Financial Resources/Insurance Housing Leisure Time [place] Talents/Skills Transportation  ADL's:  Intact  Cognition: Impaired,  Mild and Moderate  Sleep:   Per HPI   Screenings: [place] Office Visit from 11/27/2021 in [facility] Visit from 11/06/2021 in [facility] Visit from 10/23/2021 in [facility] Office Visit from 10/16/2021 in [facility] Admission (Discharged) from 10/02/2021 in [facility] [facility]  AIMS Total Score '4 7 5 21 '$ 0      Flowsheet Row Admission (Discharged) from 10/02/2021 in [place] [facility] ED from 09/27/2021 in [place] DEPT  C-SSRS RISK CATEGORY High Risk High Risk        Assessment : Continued improvement Still missing Thyroid studies   and Plan:  MRI tomorrow  Taper Haldol to 2 mg FU 1 week on labs/MRI and Haldol taper-he is advised that it may not be possilble to be completely off Haldol   Paul Russian, PA-C 01/08/2022, 3:11 PM

## 2022-01-09 ENCOUNTER — Encounter (HOSPITAL_BASED_OUTPATIENT_CLINIC_OR_DEPARTMENT_OTHER): Payer: Self-pay

## 2022-01-09 ENCOUNTER — Ambulatory Visit (HOSPITAL_BASED_OUTPATIENT_CLINIC_OR_DEPARTMENT_OTHER)
Admission: RE | Admit: 2022-01-09 | Discharge: 2022-01-09 | Disposition: A | Discharge status: Home / Self Care | Payer: Medicare Other | Source: Ambulatory Visit | Attending: Medical | Admitting: Medical

## 2022-01-09 DIAGNOSIS — C719 Malignant neoplasm of brain, unspecified: Secondary | ICD-10-CM

## 2022-01-09 DIAGNOSIS — Z923 Personal history of irradiation: Secondary | ICD-10-CM

## 2022-01-09 DIAGNOSIS — I6930 Unspecified sequelae of cerebral infarction: Secondary | ICD-10-CM

## 2022-01-09 DIAGNOSIS — I6381 Other cerebral infarction due to occlusion or stenosis of small artery: Secondary | ICD-10-CM

## 2022-01-09 DIAGNOSIS — D330 Benign neoplasm of brain, supratentorial: Secondary | ICD-10-CM

## 2022-01-09 DIAGNOSIS — I639 Cerebral infarction, unspecified: Secondary | ICD-10-CM

## 2022-01-15 ENCOUNTER — Telehealth (HOSPITAL_BASED_OUTPATIENT_CLINIC_OR_DEPARTMENT_OTHER): Payer: Medicare Other | Admitting: Medical

## 2022-01-15 DIAGNOSIS — I639 Cerebral infarction, unspecified: Secondary | ICD-10-CM

## 2022-01-15 DIAGNOSIS — I6381 Other cerebral infarction due to occlusion or stenosis of small artery: Secondary | ICD-10-CM

## 2022-01-15 DIAGNOSIS — Z79899 Other long term (current) drug therapy: Secondary | ICD-10-CM

## 2022-01-15 DIAGNOSIS — F1421 Cocaine dependence, in remission: Secondary | ICD-10-CM

## 2022-01-15 DIAGNOSIS — G250 Essential tremor: Secondary | ICD-10-CM

## 2022-01-15 DIAGNOSIS — F1120 Opioid dependence, uncomplicated: Secondary | ICD-10-CM

## 2022-01-15 DIAGNOSIS — F1921 Other psychoactive substance dependence, in remission: Secondary | ICD-10-CM

## 2022-01-15 DIAGNOSIS — F060 Psychotic disorder with hallucinations due to known physiological condition: Secondary | ICD-10-CM

## 2022-01-15 DIAGNOSIS — I10 Essential (primary) hypertension: Secondary | ICD-10-CM

## 2022-01-15 DIAGNOSIS — G4710 Hypersomnia, unspecified: Secondary | ICD-10-CM

## 2022-01-15 DIAGNOSIS — L719 Rosacea, unspecified: Secondary | ICD-10-CM

## 2022-01-15 DIAGNOSIS — Z923 Personal history of irradiation: Secondary | ICD-10-CM | POA: Diagnosis not present

## 2022-01-15 DIAGNOSIS — F1021 Alcohol dependence, in remission: Secondary | ICD-10-CM

## 2022-01-15 DIAGNOSIS — Z8659 Personal history of other mental and behavioral disorders: Secondary | ICD-10-CM

## 2022-01-15 DIAGNOSIS — I6930 Unspecified sequelae of cerebral infarction: Secondary | ICD-10-CM

## 2022-01-15 DIAGNOSIS — D330 Benign neoplasm of brain, supratentorial: Secondary | ICD-10-CM | POA: Diagnosis not present

## 2022-01-15 DIAGNOSIS — F1610 Hallucinogen abuse, uncomplicated: Secondary | ICD-10-CM

## 2022-01-15 DIAGNOSIS — G4730 Sleep apnea, unspecified: Secondary | ICD-10-CM

## 2022-01-15 DIAGNOSIS — E039 Hypothyroidism, unspecified: Secondary | ICD-10-CM

## 2022-01-15 DIAGNOSIS — F1321 Sedative, hypnotic or anxiolytic dependence, in remission: Secondary | ICD-10-CM

## 2022-01-15 DIAGNOSIS — N401 Enlarged prostate with lower urinary tract symptoms: Secondary | ICD-10-CM

## 2022-01-15 NOTE — Progress Notes (Signed)
Virtual Visit via Video Note  I connected with Paul Bray on 01/15/22 at  3:00 PM EDT by a video enabled telemedicine application and verified that I am speaking with the correct person using two identifiers.  Location: Patient: Home computer Provider: [place] [facility]   I discussed the limitations of evaluation and management by telemedicine and the availability of in person appointments. The patient expressed understanding and agreed to proceed.   History of Present Illness:See EPIC note    Observations/Objective:See EPIC note   Assessment and Plan:See EPIC note   Follow Up Instructions:See EPIC note     I discussed the assessment and treatment plan with the patient. The patient was provided an opportunity to ask questions and all were answered. The patient agreed with the plan and demonstrated an understanding of the instructions.   The patient was advised to call back or seek an in-person evaluation if the symptoms worsen or if the condition fails to improve as anticipated.  I provided 20 minutes of non-face-to-face time during this encounter.   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  He was scheduled for an open MRI last Friday because of claustrophobia in non open attempt a few weeks ago. but failed. Today he says that the head holder is the problem and suggests that "they can put you to sleep?"  At his last visit, Haldol was reduced to 2 mg QD. He has had no  recurrence of his hallucinosis/psychosis and he reports involuntary movements have stopped. He reports he has been able to setup a You Tube site  and "All I nee d now is content". He said he spent 5 hrs on computer yesterday but today he feels exhausted. "I dont know why but when I spend a lot of time on the computer the next day I am tired" He has an adequate supply of medications.   Visit Diagnosis:    ICD-10-CM   1. Sequelae, post-stroke  I69.30     2. Multiple lacunar infarcts (HCC)  I63.81     3. 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 Status post gamma knife treatment  Z92.3     5. Infarction of left basal ganglia (HCC)  I63.9     6. 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 Polysubstance dependence in early, early partial, sustained full, or sustained partial remission ([place])  F19.21     8. Hypersomnia with sleep apnea  G47.10    G47.30     9. Medication management  Z79.899     10. Familial tremor  G25.0     11. Benign prostatic hyperplasia with lower urinary tract symptoms, symptom details unspecified  N40.1     12. Essential hypertension  I10     13. 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 Opioid dependence on maintenance agonist therapy, no symptoms ([place])  F11.20     15. Alcohol use disorder, severe, in sustained remission (HCC)  F10.21     16. Sedative, hypnotic or anxiolytic use disorder, moderate, in sustained remission (HCC)  F13.21     17. Cocaine dependence in remission ([place])  F14.21     18. Acquired hypothyroidism  E03.9     19. Rosacea  L71.9     20. History of ADHD  Z86.59     21. Hypothyroidism, unspecified type  E03.9       Past Psychiatric History:  [place] Date of recent admission: 10/29/21 Date of recent discharge: 11/01/21    [facility] [place] [facility] [place] 6/17/-03/04/2020  Pt with Opiate dependence MAT Suboxone at [facility]  Dr Lynnda Shields C/O of paranoia he is aware of but cant stop/completely control.Has rx for Risperdal from [employer]. Also hx of Gamma Knife tradiation fo benign brain tumor ?2018 Says has records Requesting Psychiatrist/medication mangement for paranoia hx [place] [facility] [place] 08/21/2021 - NOW [facility] INPATIENT  Date of Admission:  10/02/2021 Date of Discharge: 10/13/2021   [facility] Alcohol intoxication 03/26/2017  Alcohol withdrawal delirium 03/26/2017  Chronic daily headache 02/25/2017  Alcohol use disorder, severe, dependence 02/22/2017  Heroin dependence MAT Methadone [place]/Subutex TBR 01/2018  Anxiety disorder, unspecified 01/20/2017  Paranoia 03/2018    [facility] Unk Pinto, NP - 04/12/2017 12:38 PM EST Formatting of this note might be different from the original. [facility] - Substance Abuse PHP/IOP Intake Assessment  BH Formulate an individual and appropriate relapse prevention plan.   [place] SU Problem: Formation of a relapse prevention plan    [facility] Uncomplicated opioid dependence (CMS/HCC) 12/24/2017  1. Discharge from hospital. 2. Follow up at [facility].                        [facility]()- Records requested-never     received             12/15/2007 -05/21/2010     Past Medical History:  CARE EVERYWHERE REVIEW No new entries/visits   Past Medical History:  Diagnosis Date   ADHD    Anxiety    Brain tumor ([place])    balance and occ memory issues and headaches   Brain tumor ([place])    sees [facility] q year   Depression    Headache    migraine and tension   Hypothyroidism    Soft tissue mass    left shoulder   Substance abuse ([place])      MRI BRAIN WITHOUT CONTRAST, 10/29/2018 2:54 PM     COMPARISON: CT brain 02/17/2017, MR brain 04/15/2016 and prior.  FINDINGS:   Calvarium/skull base: No focal marrow replacing lesion suggestive of neoplasm.  Orbits: No focal mass.  Paranasal sinuses:  No air-fluid levels or substantial mucosal disease.Mild mucosal thickening of the bilateral ethmoid air cells.  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 The mass again demonstrates internal susceptibility artifact, which could reflect calcification or hemosiderin. This mass appears to have decreased in size compared to a 2011 MR study where it measured 1.5 x 1.8 x 1 cm. Remote lacunar infarcts in the left basal ganglia. No evidence of acute abnormality. No significant white matter disease. No evidence of acute infarct. No  mass effect, acute hemorrhage, or hydrocephalus. Grossly normal flow-related signal in the major intracranial arteries and dural sinuses.  CONCLUSION:  1.  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 No hydrocephalus. Attention on follow up imaging advised.  2.  To note, for future follow up imaging consider placing an IV line access before patient undergoes contrast enhanced imaging.  3.  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 No detected  progression or bleeding since prior. No hydrocephalus. Chronic  lacune at the left caudate head and left putamen. No evidence of  acute infarct or hemorrhage. Vascular: No hyperdense vessel or unexpected calcification.  Skull: Normal. Negative for fracture or focal lesion.  Sinuses/Orbits: No acute finding.   IMPRESSION:  1. No acute finding or change from January 2022.  2. 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  3. Chronic lacunar infarct at the left basal gang        Past Surgical History:  Procedure  Laterality Date   colonscopy     gamma knife radiation 2018 for brain tumor'     hematoma removed from arm Left    MASS EXCISION Left 06/30/2019   Procedure: EXCISION OF SOFT TISSUE  MASS LEFT SHOULDER;  Surgeon: Armandina Gemma, MD;  Location: [place];  Service: General;  Laterality: Left;  LMA    Family Psychiatric History:  Alcoholism and drug abuse on father's side   Family History:  [place]  Hypertension Mother   Hypertension Father  Social History:  Social History   Socioeconomic History   Marital status: Single    Spouse name: Not on file   Number of children: Not on file   Years of education: Not on file   Highest education level: Not on file  Occupational History   Not on file  Tobacco Use   Smoking status: Former    Types: Cigarettes   Smokeless tobacco: Current   Tobacco comments:    quit jan 2020  Vaping Use   Vaping Use: Every day  Substance and Sexual Activity   Alcohol use: Not Currently    Comment: quit Oct 2019   Drug use: Not Currently    Types: Cocaine, IV, Heroin, MDMA (Ecstacy)    Comment: last reported use; cocaine and heroin in 2019; MDMA Feb 2023   Sexual activity: Not Currently  Other Topics Concern   Not on file  Social History Narrative   Not on file   Social Determinants of Health   Financial Resource Strain: Not on file  Food Insecurity: Not on file  Transportation Needs: Not on file  Physical Activity: Not on file  Stress: Not on file  Social Connections: Not on file    Allergies:  Allergies  Allergen Reactions   Ingrezza [Valbenazine Tosylate] Other (See Comments)    Severe tongue chewing at 80 mg Tolerates lower doses   Naloxone Palpitations    Pt report MAT Rx is Subutex/Buprenorphine only   Amphetamine-Dextroamphetamine Other (See Comments)    Metabolic Disorder Labs: Lab Results  Component Value Date   HGBA1C 5.0 10/03/2021   MPG 96.8 10/03/2021   No results found for: "PROLACTIN" Lab Results   Component Value Date   CHOL 191 10/03/2021   TRIG 92 10/03/2021  HDL 48 10/03/2021   CHOLHDL 4.0 10/03/2021   VLDL 18 10/03/2021   LDLCALC 125 (H) 10/03/2021   Lab Results  Component Value Date   TSH 0.417 (L) 11/10/2021   TSH 2.166 01/09/2018    Therapeutic Level Labs: No results found for: "LITHIUM" No results found for: "VALPROATE" No results found for: "CBMZ"  Current Medications: Current Outpatient Medications  Medication Sig Dispense Refill   buprenorphine (SUBUTEX) 8 MG SUBL SL tablet Place 24 mg under the tongue daily as needed.     diphenhydrAMINE (BENADRYL) 50 MG tablet Take 2-4 tablets q6h as needed for restlessness related to Haldol 100 tablet 2   doxycycline (VIBRAMYCIN) 50 MG capsule Take 1 capsule (50 mg total) by mouth daily. 90 capsule 1   gabapentin (NEURONTIN) 400 MG capsule Take 1 capsule (400 mg total) by mouth 2 (two) times daily for 180 doses. 180 capsule 0   haloperidol (HALDOL) 2 MG tablet Take 1 tablet (2 mg total) by mouth daily for 30 doses. 30 tablet 0   hydrOXYzine (VISTARIL) 25 MG capsule Take 1 capsule (25 mg total) by mouth 3 (three) times daily as needed for anxiety. 90 capsule 2   modafinil (PROVIGIL) 200 MG tablet Take 3 tablets (600 mg total) by mouth daily. 90 tablet 2   polyethylene glycol (MIRALAX / GLYCOLAX) 17 g packet Take 17 g by mouth daily. 30 each 0   propranolol (INDERAL) 20 MG tablet Take 20 mg by mouth 3 (three) times daily.     tamsulosin (FLOMAX) 0.4 MG CAPS capsule Take 2 capsules (0.8 mg total) by mouth daily after supper. 60 capsule 0   thyroid (ARMOUR THYROID) 120 MG tablet Take 1 tablet (120 mg total) by mouth daily before breakfast. 90 tablet 0   traZODone (DESYREL) 100 MG tablet Take 1 tablet (100 mg total) by mouth at bedtime. 90 tablet 0   No current facility-administered medications for this visit.     Musculoskeletal: Strength & Muscle Tone: Telepsych visit-Grossly normal Musculoskeletal and cranial nerve  inspections Gait & Station: NA Patient leans: N/A   Psychiatric Specialty Exam: Review of Systems  Constitutional:  Positive for fatigue. Negative for activity change, appetite change, chills, diaphoresis, fever and unexpected weight change.  HENT:  Negative for congestion, dental problem, ear pain, facial swelling, hearing loss, nosebleeds, postnasal drip, rhinorrhea, sinus pressure, sinus pain, sneezing, sore throat, tinnitus, trouble swallowing and voice change.   Eyes:  Negative for photophobia, pain, discharge, redness, itching and visual disturbance.  Respiratory: Negative.  Negative for apnea, cough, choking, chest tightness, shortness of breath, wheezing and stridor.   Cardiovascular:  Negative for chest pain, palpitations and leg swelling.  Gastrointestinal: Negative.   Endocrine: Negative for cold intolerance, heat intolerance, polydipsia, polyphagia and polyuria.  Genitourinary:  Negative for decreased urine volume, difficulty urinating, dysuria, enuresis, flank pain, frequency, hematuria, scrotal swelling and testicular pain.       BPH  Musculoskeletal:  Negative for arthralgias, back pain, gait problem, joint swelling, myalgias, neck pain and neck stiffness.  Skin:  Positive for color change (Rosacea). Negative for pallor, rash and wound.  Allergic/Immunologic: Negative for environmental allergies, food allergies and immunocompromised state.  Neurological:  Positive for tremors (Familial-involuntaryhas stoped with haldol at 2 mg). Negative for dizziness, seizures, syncope, facial asymmetry, speech difficulty, weakness, light-headedness, numbness and headaches.  Hematological:  Negative for adenopathy. Does not bruise/bleed easily.  Psychiatric/Behavioral:  Positive for agitation (MRI) and sleep disturbance. Negative for behavioral problems, confusion, decreased concentration, dysphoric  mood, hallucinations, self-injury and suicidal ideas. The patient is not nervous/anxious and is not  hyperactive.     There were no vitals taken for this visit.There is no height or weight on file to calculate BMI.  General Appearance: Casual and Neat  Eye Contact:  Good  Speech:  Clear and Coherent  Volume:  Normal  Mood:  Euthymic  Affect:  Appropriate and Congruent  Thought Process:  Coherent and Descriptions of Associations: Intact  Orientation:  Full (Time, Place, and Person)  Thought Content: WDL and Logical   Suicidal Thoughts:  No  Homicidal Thoughts:  No  Memory:  Affected by addictions and strokes  Judgement Fair  Insight:  Fair  Psychomotor Activity:  Normal  Concentration:  Concentration: Good and Attention Span: Good  Recall:   See memory  Fund of Knowledge:  WDL  Language: Good  Akathisia:  Negative  Handed:  Right  AIMS (if indicated): Grossly negative  Assets:  Desire for Improvement Financial Resources/Insurance Housing Resilience Social Support Talents/Skills Transportation Vocational/Educational  ADL's:  Intact  Cognition: WNL  Sleep:   No complaint   Screenings: [place] Office Visit from 11/27/2021 in [facility] Visit from 11/06/2021 in [facility] Visit from 10/23/2021 in [facility] Office Visit from 10/16/2021 in [facility] Admission (Discharged) from 10/02/2021 in [facility]  AIMS Total Score '4 7 5 21 '$ 0      Flowsheet Row Admission (Discharged) from 10/02/2021 in [facility] ED from 09/27/2021 in [place] DEPT  C-SSRS RISK CATEGORY High Risk High Risk        Assessment Stable and continues to improve D/C MRI- CT June suggests no change/No new symptoms     and Plan:  ? PT/OT Keep Haldol at 2 mg GET THYROID STUDY D/C MRI FU 1 week    [name],  PA-C 01/15/2022, 3:45 PM

## 2022-01-22 ENCOUNTER — Encounter (HOSPITAL_COMMUNITY): Payer: Self-pay | Admitting: Medical

## 2022-01-22 ENCOUNTER — Telehealth (HOSPITAL_BASED_OUTPATIENT_CLINIC_OR_DEPARTMENT_OTHER): Payer: Medicare Other | Admitting: Medical

## 2022-01-22 DIAGNOSIS — Z79899 Other long term (current) drug therapy: Secondary | ICD-10-CM

## 2022-01-22 DIAGNOSIS — F1921 Other psychoactive substance dependence, in remission: Secondary | ICD-10-CM | POA: Diagnosis not present

## 2022-01-22 DIAGNOSIS — I6381 Other cerebral infarction due to occlusion or stenosis of small artery: Secondary | ICD-10-CM | POA: Diagnosis not present

## 2022-01-22 DIAGNOSIS — G4710 Hypersomnia, unspecified: Secondary | ICD-10-CM

## 2022-01-22 DIAGNOSIS — F22 Delusional disorders: Secondary | ICD-10-CM

## 2022-01-22 DIAGNOSIS — E039 Hypothyroidism, unspecified: Secondary | ICD-10-CM

## 2022-01-22 DIAGNOSIS — F060 Psychotic disorder with hallucinations due to known physiological condition: Secondary | ICD-10-CM

## 2022-01-22 DIAGNOSIS — I6930 Unspecified sequelae of cerebral infarction: Secondary | ICD-10-CM

## 2022-01-22 DIAGNOSIS — L719 Rosacea, unspecified: Secondary | ICD-10-CM

## 2022-01-22 DIAGNOSIS — I10 Essential (primary) hypertension: Secondary | ICD-10-CM

## 2022-01-22 DIAGNOSIS — D330 Benign neoplasm of brain, supratentorial: Secondary | ICD-10-CM

## 2022-01-22 DIAGNOSIS — G250 Essential tremor: Secondary | ICD-10-CM

## 2022-01-22 MED ORDER — HALOPERIDOL 1 MG PO TABS: 1.0000 mg | ORAL_TABLET | Freq: Every day | ORAL | 0 refills | Status: DC

## 2022-01-22 MED ORDER — MODAFINIL 200 MG PO TABS: 600.0000 mg | ORAL_TABLET | Freq: Every day | ORAL | 2 refills | Status: DC

## 2022-01-22 NOTE — Progress Notes (Signed)
[place] MD/PA/NP OP Progress Note  01/22/2022 3:27 PM Paul Bray  MRN:  [id number] Virtual Visit via Video Note  I connected with Paul Bray on 01/22/22 at  3:00 PM EDT by a video enabled telemedicine application and verified that I am speaking with the correct person using two identifiers.  Location: Patient: At Home Provider: [facility] [place]   I discussed the limitations of evaluation and management by telemedicine and the availability of in person appointments. The patient expressed understanding and agreed to proceed.   History of Present Illness:See EPIC note    Observations/Objective:See EPIC note   Assessment and Plan:See EPIC note   Follow Up Instructions:See EPIC note    I discussed the assessment and treatment plan with the patient. The patient was provided an opportunity to ask questions and all were answered. The patient agreed with the plan and demonstrated an understanding of the instructions.   The patient was advised to call back or seek an in-person evaluation if the symptoms worsen or if the condition fails to improve as anticipated.  I provided 20 minutes of non-face-to-face time during this encounter.   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 Says he is going Monday to get Thyroid study. Has had no recurrence of hallucinations at 2 mg Haldol. Karena Addison /TDK have abate with lower doses.  He continues to develop his You Tube videos and seeking content.He has not been to any support groups.Continues with MAT Suboxone  Visit Diagnosis:    ICD-10-CM   1. Sequelae, post-stroke  I69.30     2. Multiple lacunar infarcts (HCC)   I63.81     3. 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 Persistent hypersomnia  G47.10     6. Polysubstance dependence in early, early partial, sustained full, or sustained partial remission ([place])  F19.21     7. 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 Acquired hypothyroidism  E03.9     9. Familial tremor  G25.0     10. Rosacea  L71.9     11. Essential hypertension  I10     12. Medication management  Z79.899       Past Psychiatric History:  [place] Date of recent admission: 10/29/21 Date of recent discharge: 11/01/21    [facility] [place] [facility] [place] 6/17/-03/04/2020  Pt with Opiate dependence MAT Suboxone at [facility] Dr Lynnda Shields C/O of paranoia he is aware of but cant stop/completely control.Has rx for Risperdal from [employer]. Also hx of Gamma Knife tradiation fo benign brain tumor ?2018 Says has records Requesting Psychiatrist/medication mangement for paranoia hx [facility] [place] 08/21/2021 - NOW [facility] INPATIENT  Date of Admission:  10/02/2021 Date of Discharge: 10/13/2021   [place] Alcohol intoxication 03/26/2017  Alcohol withdrawal delirium 03/26/2017  Chronic daily headache 02/25/2017  Alcohol use disorder, severe, dependence 02/22/2017  Heroin dependence MAT Methadone [place]/Subutex TBR 01/2018  Anxiety disorder, unspecified  01/20/2017  Paranoia 03/2018    [facility] Unk Pinto, NP - 04/12/2017 12:38 PM EST Formatting of this note might be different from the original. [facility] Psychiat[facility] - Substance Abuse PHP/IOP Intake Assessment  BH Formulate an individual and appropriate relapse prevention plan.   [place] SU Problem: Formation of a relapse prevention plan    [facility] Uncomplicated  opioid dependence (CMS/HCC) 12/24/2017  1. Discharge from hospital. 2. Follow up at [facility].                        [facility]([place])- Records requested-never     received             12/15/2007 -05/21/2010       Past Medical History:  Past Medical History:  Diagnosis Date   ADHD    Anxiety    Brain tumor ([place])    balance and occ memory issues and headaches   Brain tumor ([place])    sees [facility] q year   Depression    Headache    migraine and tension   Hypothyroidism    Soft tissue mass    left shoulder   Substance abuse ([place])    CARE EVERYWHERE REVIEW No new entries/visits       Past Medical History:  Diagnosis Date   ADHD     Anxiety     Brain tumor ([place])      balance and occ memory issues and headaches   Brain tumor ([place])      sees [facility] q year   Depression     Headache      migraine and tension   Hypothyroidism     Soft tissue mass      left shoulder   Substance abuse ([place])       MRI BRAIN WITHOUT CONTRAST, 10/29/2018 2:54 PM     COMPARISON: CT brain 02/17/2017, MR brain 04/15/2016 and prior.  FINDINGS:   Calvarium/skull base: No focal marrow replacing lesion suggestive of neoplasm.  Orbits: No focal mass.  Paranasal sinuses: No air-fluid levels or substantial mucosal disease.Mild mucosal thickening of the bilateral ethmoid air cells.  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 The mass again demonstrates internal susceptibility artifact, which could reflect calcification or hemosiderin. This mass appears to have decreased in size compared to a 2011 MR study where it measured 1.5 x 1.8 x 1 cm. Remote lacunar infarcts in the left basal ganglia. No evidence of acute abnormality. No significant white matter disease. No evidence of acute infarct. No mass effect, acute  hemorrhage, or hydrocephalus. Grossly normal flow-related signal in the major intracranial arteries and dural sinuses.  CONCLUSION:  1.  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 No hydrocephalus. Attention on follow up imaging advised.  2.  To note, for future follow up imaging consider placing an IV line access before patient undergoes contrast enhanced imaging.  3.  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 No detected  progression or bleeding since prior. No hydrocephalus. Chronic  lacune at the left caudate head and left putamen. No evidence of  acute infarct or hemorrhage. Vascular: No hyperdense vessel or unexpected calcification.  Skull: Normal. Negative for fracture or focal lesion.  Sinuses/Orbits: No acute finding.   IMPRESSION:  1. No acute finding or change from January 2022.  2. 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  3. Chronic lacunar infarct at the left basal gang        CARE EVERYWHERE REVIEW No new entries/visits    Past Surgical History:  Procedure Laterality Date   colonscopy     gamma knife radiation 2018 for brain tumor'     hematoma removed from arm Left    MASS EXCISION Left 06/30/2019   Procedure: EXCISION OF SOFT TISSUE  MASS LEFT SHOULDER;  Surgeon: Armandina Gemma, MD;  Location: [place];  Service: General;  Laterality: Left;  LMA    Family Psychiatric History:  Alcoholism and drug abuse on father's side   Family History:  [place]  Hypertension Mother   Hypertension Father  Social History:  Socioeconomic History   Marital status: Single      Spouse name: Not on file   Number of children: Not on file   Years of education: Not on file   Highest education  level: Not on file  Occupational History   Not on file  Tobacco Use   Smoking status: Former      Types: Cigarettes   Smokeless tobacco: Current   Tobacco comments:      quit jan 2020  Vaping Use   Vaping Use: Every day  Substance and Sexual Activity   Alcohol use: Not Currently      Comment: quit Oct 2019   Drug use: Not Currently      Types: Cocaine, IV, Heroin, MDMA (Ecstacy)      Comment: last reported use; cocaine and heroin in 2019; MDMA Feb 2023   Sexual activity: Not Currently  Other Topics Concern   Not on file  Social History Narrative   Not on file    Social Determinants of Health     Difficulty of Paying Living Expenses: Disability  Food Insecurity:    Worried About [place] in the Last Year: On disability   [place] in the Last Year:   Transportation Needs:    Film/video editor (Medical): Uses Ecologist (Non-Medical):   Physical Activity:    Days of Exercise per Week:    Minutes of Exercise per Session:   Stress:    Feeling of Stress : Constant   Social Connections:    Frequency of Communication with Friends and Family: Mother recently kicked him out of house   Frequency of Social Gatherings with Friends and Family: Lives alone-   Attends Religious Services: No   Active Member of Clubs or Organizations: Not active with AA /NA   Attends Engineer, production           Allergies:  Allergies  Allergen Reactions   Ingrezza [Valbenazine Tosylate] Other (See Comments)    Severe tongue chewing at 80 mg Tolerates lower doses   Naloxone Palpitations    Pt report MAT Rx is Subutex/Buprenorphine only   Amphetamine-Dextroamphetamine Other (See Comments)    Metabolic Disorder Labs: Lab Results  Component Value Date   HGBA1C 5.0 10/03/2021   MPG 96.8 10/03/2021   No results found for: "PROLACTIN" Lab Results  Component Value Date  CHOL 191 10/03/2021   TRIG 92 10/03/2021   HDL 48 10/03/2021    CHOLHDL 4.0 10/03/2021   VLDL 18 10/03/2021   LDLCALC 125 (H) 10/03/2021   Lab Results  Component Value Date   TSH 0.417 (L) 11/10/2021   TSH 2.166 01/09/2018      Current Medications: Current Outpatient Medications  Medication Sig Dispense Refill   haloperidol (HALDOL) 1 MG tablet Take 1 tablet (1 mg total) by mouth daily. 90 tablet 0   buprenorphine (SUBUTEX) 8 MG SUBL SL tablet Place 24 mg under the tongue daily as needed.     diphenhydrAMINE (BENADRYL) 50 MG tablet Take 2-4 tablets q6h as needed for restlessness related to Haldol 100 tablet 2   doxycycline (VIBRAMYCIN) 50 MG capsule Take 1 capsule (50 mg total) by mouth daily. 90 capsule 1   gabapentin (NEURONTIN) 400 MG capsule Take 1 capsule (400 mg total) by mouth 2 (two) times daily for 180 doses. 180 capsule 0   hydrOXYzine (VISTARIL) 25 MG capsule Take 1 capsule (25 mg total) by mouth 3 (three) times daily as needed for anxiety. 90 capsule 2   modafinil (PROVIGIL) 200 MG tablet Take 3 tablets (600 mg total) by mouth daily. 90 tablet 2   polyethylene glycol (MIRALAX / GLYCOLAX) 17 g packet Take 17 g by mouth daily. 30 each 0   propranolol (INDERAL) 20 MG tablet Take 20 mg by mouth 3 (three) times daily.     tamsulosin (FLOMAX) 0.4 MG CAPS capsule Take 2 capsules (0.8 mg total) by mouth daily after supper. 60 capsule 0   thyroid (ARMOUR THYROID) 120 MG tablet Take 1 tablet (120 mg total) by mouth daily before breakfast. 90 tablet 0   traZODone (DESYREL) 100 MG tablet Take 1 tablet (100 mg total) by mouth at bedtime. 90 tablet 0   No current facility-administered medications for this visit.     Musculoskeletal: Strength & Muscle Tone: Telepsych visit-Grossly normal Musculoskeletal and cranial nerve inspections Gait & Station: NA Patient leans: N/A   Psychiatric Specialty Exam: Review of Systems  Constitutional:  Negative for activity change, appetite change, chills, diaphoresis, fatigue, fever and unexpected weight  change.  HENT:  Positive for congestion. Negative for postnasal drip, rhinorrhea, sinus pressure, sinus pain, sneezing and sore throat.   Genitourinary:  Negative for decreased urine volume, difficulty urinating (BPH controlled), dysuria, enuresis, flank pain, frequency, genital sores, hematuria, penile discharge, penile pain, penile swelling, scrotal swelling, testicular pain and urgency.  Neurological:  Positive for tremors. Negative for dizziness, seizures, syncope, facial asymmetry, speech difficulty, weakness, light-headedness, numbness and headaches.  Hematological:  Negative for adenopathy. Does not bruise/bleed easily.  Psychiatric/Behavioral:  Negative for agitation, behavioral problems, confusion, decreased concentration, dysphoric mood, hallucinations, self-injury, sleep disturbance and suicidal ideas. The patient is not nervous/anxious and is not hyperactive.     There were no vitals taken for this visit.There is no height or weight on file to calculate BMI.My Chart  General Appearance: Casual  Eye Contact:  Good  Speech:  Clear and Coherent and Normal Rate  Volume:  Normal  Mood:  Euthymic  Affect:  Appropriate and Congruent  Thought Process:  Coherent, Goal Directed, and Descriptions of Associations: Intact  Orientation:  Full (Time, Place, and Person)  Thought Content: WDL   Suicidal Thoughts:  No  Homicidal Thoughts:  No  Memory:  Unchanged   Immediate;   Fair Recent;   Fair Remote;   Fair  Judgement:  Impaired  Insight:  Improving  Psychomotor Activity:  Grossly normal  Concentration:  Concentration: Good and Attention Span: Good  Recall:   Limited  Fund of Knowledge:  WDL  Language: Good  Akathisia:  Negative  Handed:  Right  AIMS (if indicated): not done  Assets:  Communication Skills Desire for Improvement Financial Resources/Insurance Housing Leisure Time Resilience Social Support Talents/Skills Transportation Vocational/Educational  ADL's:  Intact   Cognition: Impaired,  Mild and Moderate  Sleep:   Hypersomnia treated with Modanafil   Screenings: [place] Office Visit from 11/27/2021 in [facility] Visit from 11/06/2021 in [place] [facility] Visit from 10/23/2021 in [facility] [facility] Visit from 10/16/2021 in [place] [facility] Admission (Discharged) from 10/02/2021 in [place] [facility]  AIMS Total Score '4 7 5 21 '$ 0      Flowsheet Row Admission (Discharged) from 10/02/2021 in [place] [facility] ED from 09/27/2021 in [place] DEPT  C-SSRS RISK CATEGORY High Risk High Risk        Assessment : Stable with improvements   and Plan:  Trial '1mg'$  Haldol daily Refill Modanafil Continue other meds Go to some online 12 Step meetings FU 1 week   [name], PA-C 01/22/2022, 3:27 PM

## 2022-01-29 ENCOUNTER — Telehealth (HOSPITAL_BASED_OUTPATIENT_CLINIC_OR_DEPARTMENT_OTHER): Payer: Medicare Other | Admitting: Medical

## 2022-01-29 ENCOUNTER — Encounter (HOSPITAL_COMMUNITY): Payer: Self-pay | Admitting: Medical

## 2022-01-29 ENCOUNTER — Ambulatory Visit (HOSPITAL_COMMUNITY): Payer: Medicare Other | Admitting: Medical

## 2022-01-29 DIAGNOSIS — F1610 Hallucinogen abuse, uncomplicated: Secondary | ICD-10-CM

## 2022-01-29 DIAGNOSIS — G4710 Hypersomnia, unspecified: Secondary | ICD-10-CM

## 2022-01-29 DIAGNOSIS — F1921 Other psychoactive substance dependence, in remission: Secondary | ICD-10-CM

## 2022-01-29 DIAGNOSIS — F060 Psychotic disorder with hallucinations due to known physiological condition: Secondary | ICD-10-CM | POA: Diagnosis not present

## 2022-01-29 DIAGNOSIS — G250 Essential tremor: Secondary | ICD-10-CM

## 2022-01-29 DIAGNOSIS — N401 Enlarged prostate with lower urinary tract symptoms: Secondary | ICD-10-CM

## 2022-01-29 DIAGNOSIS — F22 Delusional disorders: Secondary | ICD-10-CM

## 2022-01-29 DIAGNOSIS — L719 Rosacea, unspecified: Secondary | ICD-10-CM

## 2022-01-29 DIAGNOSIS — I10 Essential (primary) hypertension: Secondary | ICD-10-CM

## 2022-01-29 DIAGNOSIS — Z8659 Personal history of other mental and behavioral disorders: Secondary | ICD-10-CM

## 2022-01-29 DIAGNOSIS — Z923 Personal history of irradiation: Secondary | ICD-10-CM

## 2022-01-29 DIAGNOSIS — I6381 Other cerebral infarction due to occlusion or stenosis of small artery: Secondary | ICD-10-CM | POA: Diagnosis not present

## 2022-01-29 DIAGNOSIS — D330 Benign neoplasm of brain, supratentorial: Secondary | ICD-10-CM

## 2022-01-29 DIAGNOSIS — E039 Hypothyroidism, unspecified: Secondary | ICD-10-CM

## 2022-01-29 DIAGNOSIS — I6930 Unspecified sequelae of cerebral infarction: Secondary | ICD-10-CM | POA: Diagnosis not present

## 2022-01-29 DIAGNOSIS — Z79899 Other long term (current) drug therapy: Secondary | ICD-10-CM

## 2022-01-29 MED ORDER — GABAPENTIN 400 MG PO CAPS: 400.0000 mg | ORAL_CAPSULE | Freq: Two times a day (BID) | ORAL | 0 refills | Status: DC

## 2022-01-29 NOTE — Progress Notes (Signed)
[place] MD/PA/NP OP Progress Note  01/29/2022 3:13 PM Paul Bray  MRN:  [id number] Virtual Visit via Video Note  I connected with Paul Bray on 01/29/22 at  3:00 PM EDT by a video enabled telemedicine application and verified that I am speaking with the correct person using two identifiers.  Location: Patient: At home Provider: [facility]   I discussed the limitations of evaluation and management by telemedicine and the availability of in person appointments. The patient expressed understanding and agreed to proceed.   History of Present Illness:See EPIC note    Observations/Objective:See EPIC note   Assessment and Plan:See EPIC note   Follow Up Instructions:See EPIC note    I discussed the assessment and treatment plan with the patient. The patient was provided an opportunity to ask questions and all were answered. The patient agreed with the plan and demonstrated an understanding of the instructions.   The patient was advised to call back or seek an in-person evaluation if the symptoms worsen or if the condition fails to improve as anticipated.  I provided 20 minutes of non-face-to-face time during this encounter. 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 The strokes occurred during a time when a brain CT and subsequently MRI noted a 1.5 cm supraventricular mass arising from the an intraventricular mass in the right lateral ventricle along the septum pellucidum.  MRI BRAIN WITH AND WITHOUT CONTRAST, Apr 25, 2010 03:36:00 PM  the right lateral ventricle, which causes mild leftward shift of   the septum pellucidum. The lesion is of similar size compared to the   prior study from 07/17/2009 (and probably also similar in size to the   prior, very  limited quality, study from 10/30/2008). The mass shows an   mildly heterogeneous, iso to hypointense signal on T2 weighted images   and fairly intense, homogeneous enhancement. Both the T2 signal and   enhancement appears somewhat different than on the prior study, but   this is perhaps most likely related to technical differences in   scanners and scanning techniques. The mass does appear to show some   susceptibility artifact, suggesting either calcium or previous   hemorrhage. No significant white matter disease or acute ischemia.   No evidence of hydrocephalus.  Grossly normal flow-related signal in   the major intracranial arteries and dural sinuses.   .  Additional Comments: Susceptibility within the lesion limits the   diagnostic utility of the gadolinium bolus perfusion imaging   CONCLUSION******************************   18 mm enhancing right lateral ventricular mass which appears stable   in size compared to prior outside studies. The differential includes   central neurocytoma or intraventricular meningioma. Other less likely   etiologies include ependymoma, subependymoma (typically do not show   this degree of enhancement) or papilloma (atypical location in an   adult).           Visit Date: 05/01/2010 He reports symptoms dating back to 1995, regressed some in 2007-2008, and increased again in 2009. During his workup by a sleep physician he was found to have a lesion in the right lateral ventricle. Paul Bray also reports a headache, mood changes and high anxiety. I saw Paul Bray in the office today for a neurosurgical evaluation. I have  enclosed a copy of the comprehensive report of that consultation.  In short, he is a 56 year old gentleman who is here for a second opinion. He has had a known brain lesion that has been followed since July of 2010. His primary symptom seems to be hypersomnia. 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 The patient, however, has been convinced that a lesion that was seen on the MRI scan of his brain was responsible for his symptoms, and he is here for an additional opinion.  have explained to the patient that I do not really think that this lesion necessarily is related to his symptoms. Even if it were, I do not think that trying to address this lesion from a surgical standpoint would necessarily result in improvement of his symptoms and certainly would carry a significant degree of potential morbidity, given its location and its proximity to the internal cerebral vein. I have recommended just continued observation. I have told him I would be happy to see him back in a year's time with a followup MRI scan.   MRI BRAIN WITH AND WITHOUT CONTRAST, Apr 25, 2010 03:36:00 PM  *****************************CONCLUSION******************************  18 mm enhancing right lateral ventricular mass which appears stable  in size compared to prior outside studies. The differential includes  central neurocytoma or intraventricular meningioma. Other less likely  etiologies include ependymoma, subependymoma (typically do not show  this degree of enhancement) or papilloma (atypical location in an  adult).   MRI BRAIN WITH AND WITHOUT CONTRAST (BRAIN TUMOR PROTOCOL), 04/15/2016 2:49 PM   INDICATION: meningioma D32.9 Meningioma ([place]  Brain: Redemonstration of an intraventricular mass in the right lateral ventricle along the posterior septum pellucidum, now measuring 2 x 1.5 x 1.3 cm (series 16, image 19). The mass shows internal susceptibility artifact, but only minimal internal enhancement. It is heterogeneous in signal characteristics on T2-weighted imaging. The lateral ventricles may be slightly larger in size compared to the  prior exam, which may relate to a subtle interval increase in cerebral volume loss. No acute ischemia. No significant white matter disease. No hemorrhag   CT Head WO IV Contrast CT HEAD WO CONTRAST, 1:04 PM  Head -- Computed Tomography   Impression10/28/2018  IMPRESSION:   Intraventricular mass lesion. No images are available for comparison, however several reports are identified from [facility] including 2011 and 2017 MRI of the brain and a head CT describing intraventricular mass lesion.  The imaging appearance would be   compatible with reported history of meningioma although several additional intraventricular mass lesions could have this appearance.   MR BRAIN WWO CONTRAST  Anatomical Region Laterality Modality  Head -- Magnetic Resonance   Impression   1.  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 No hydrocephalus. Attention on follow up imaging advised.  2.  To note, for future follow up imaging consider placing an IV line access before patient undergoes contrast enhanced imaging.  3.  Remote lacunar infarcts in the left basal ganglia. Narrative              Visit Diagnosis:    ICD-10-CM   1. Sequelae, post-stroke  I69.30     2. Multiple lacunar infarcts (HCC)  I63.81     3. 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 Persistent hypersomnia  G47.10     6. Polysubstance dependence in early, early partial, sustained full, or sustained partial remission ([place])  F19.21     7. 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 Acquired hypothyroidism  E03.9     9. Familial tremor  G25.0     10. Rosacea  L71.9     11. Essential hypertension  I10     12. Medication management  Z79.899     13. Status post gamma knife treatment  Z92.3     14. Benign  prostatic hyperplasia with lower urinary tract symptoms, symptom details unspecified  N40.1     15. 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 History of ADHD  Z86.59       Past Psychiatric History: ***  Past Medical History:  Past Medical History:  Diagnosis Date   ADHD    Anxiety    Brain tumor ([place])    balance and occ memory issues and headaches   Brain tumor ([place])    sees [facility] q year   Depression    Headache    migraine and tension   Hypothyroidism    Soft tissue mass    left shoulder   Substance abuse ([place])     Past Surgical History:  Procedure Laterality Date   colonscopy     gamma knife radiation 2018 for brain tumor'     hematoma removed from arm Left    MASS EXCISION Left 06/30/2019   Procedure: EXCISION OF SOFT TISSUE  MASS LEFT SHOULDER;  Surgeon: Armandina Gemma, MD;  Location: [place];  Service: General;  Laterality: Left;  LMA    Family Psychiatric History: ***  Family History: No family history on file.  Social History:  Social History   Socioeconomic History   Marital status: Single    Spouse name: Not on file   Number of children: Not on file   Years of education: Not on file   Highest education level: Not on file  Occupational History   Not on file  Tobacco Use   Smoking status: Former    Types: Cigarettes   Smokeless tobacco: Current   Tobacco comments:    quit jan 2020  Vaping Use   Vaping Use: Every day  Substance and Sexual Activity   Alcohol use: Not Currently    Comment: quit Oct 2019   Drug use: Not Currently    Types: Cocaine, IV, Heroin, MDMA (Ecstacy)    Comment: last reported use; cocaine and heroin in 2019; MDMA Feb 2023   Sexual activity: Not Currently  Other Topics Concern   Not on file  Social History Narrative   Not on file   Social Determinants of Health   Financial Resource Strain: Not on file  Food Insecurity: Not on file  Transportation Needs: Not on file  Physical Activity: Not on file   Stress: Not on file  Social Connections: Not on file    Allergies:  Allergies  Allergen Reactions   Ingrezza [Valbenazine Tosylate] Other (See Comments)    Severe tongue chewing at 80 mg Tolerates lower doses   Naloxone Palpitations    Pt report MAT Rx is Subutex/Buprenorphine only   Amphetamine-Dextroamphetamine Other (See Comments)    Metabolic Disorder Labs: Lab  Results  Component Value Date   HGBA1C 5.0 10/03/2021   MPG 96.8 10/03/2021   No results found for: "PROLACTIN" Lab Results  Component Value Date   CHOL 191 10/03/2021   TRIG 92 10/03/2021   HDL 48 10/03/2021   CHOLHDL 4.0 10/03/2021   VLDL 18 10/03/2021   LDLCALC 125 (H) 10/03/2021   Lab Results  Component Value Date   TSH 0.417 (L) 11/10/2021   TSH 2.166 01/09/2018    Therapeutic Level Labs: No results found for: "LITHIUM" No results found for: "VALPROATE" No results found for: "CBMZ"  Current Medications: Current Outpatient Medications  Medication Sig Dispense Refill   buprenorphine (SUBUTEX) 8 MG SUBL SL tablet Place 24 mg under the tongue daily as needed.     diphenhydrAMINE (BENADRYL) 50 MG tablet Take 2-4 tablets q6h as needed for restlessness related to Haldol 100 tablet 2   doxycycline (VIBRAMYCIN) 50 MG capsule Take 1 capsule (50 mg total) by mouth daily. 90 capsule 1   gabapentin (NEURONTIN) 400 MG capsule Take 1 capsule (400 mg total) by mouth 2 (two) times daily for 180 doses. 180 capsule 0   haloperidol (HALDOL) 1 MG tablet Take 1 tablet (1 mg total) by mouth daily. 90 tablet 0   hydrOXYzine (VISTARIL) 25 MG capsule Take 1 capsule (25 mg total) by mouth 3 (three) times daily as needed for anxiety. 90 capsule 2   modafinil (PROVIGIL) 200 MG tablet Take 3 tablets (600 mg total) by mouth daily. 90 tablet 2   polyethylene glycol (MIRALAX / GLYCOLAX) 17 g packet Take 17 g by mouth daily. 30 each 0   propranolol (INDERAL) 20 MG tablet Take 20 mg by mouth 3 (three) times daily.     tamsulosin  (FLOMAX) 0.4 MG CAPS capsule Take 2 capsules (0.8 mg total) by mouth daily after supper. 60 capsule 0   thyroid (ARMOUR THYROID) 120 MG tablet Take 1 tablet (120 mg total) by mouth daily before breakfast. 90 tablet 0   traZODone (DESYREL) 100 MG tablet Take 1 tablet (100 mg total) by mouth at bedtime. 90 tablet 0   No current facility-administered medications for this visit.   Musculoskeletal: Strength & Muscle Tone: Telepsych visit-Grossly normal Musculoskeletal and cranial nerve inspections Gait & Station: NA Patient leans: N/A  Psychiatric Specialty Exam: Review of Systems  Constitutional:  Negative for activity change, appetite change, chills, diaphoresis, fatigue, fever and unexpected weight change.  Respiratory:  Negative for apnea, cough, choking, chest tightness, shortness of breath, wheezing and stridor.   Cardiovascular:  Negative for chest pain, palpitations and leg swelling.  Genitourinary:  Positive for difficulty urinating (BPH on medication). Negative for decreased urine volume, dysuria, enuresis, flank pain, frequency, genital sores, hematuria, penile discharge, penile pain, penile swelling, scrotal swelling, testicular pain and urgency.  Neurological:  Negative for dizziness, tremors, seizures, syncope, facial asymmetry, speech difficulty, weakness, light-headedness, numbness and headaches.  Psychiatric/Behavioral:  Positive for sleep disturbance (Rx Modanafil Not taking Trazodone). Negative for agitation, behavioral problems, confusion, decreased concentration, dysphoric mood, hallucinations, self-injury and suicidal ideas. The patient is not nervous/anxious and is not hyperactive.        Opiate dependence in remission on MAT Subutex    There were no vitals taken for this visit.There is no height or weight on file to calculate BMI.MY CHART Visit  Reports home self monitor  BP 120/80  General Appearance: {Appearance:22683}  Eye Contact:  {BHH EYE CONTACT:22684}  Speech:   {Speech:22685}  Volume:  {Volume (PAA):22686}  Mood:  {BHH MOOD:22306}  Affect:  {Affect (PAA):22687}  Thought Process:  {Thought Process (PAA):22688}  Orientation:  {BHH ORIENTATION (PAA):22689}  Thought Content: {Thought Content:22690}   Suicidal Thoughts:  {ST/HT (PAA):22692}  Homicidal Thoughts:  {ST/HT (PAA):22692}  Memory:  {BHH MEMORY:22881}  Judgement:  {Judgement (PAA):22694}  Insight:  {Insight (PAA):22695}  Psychomotor Activity:  {Psychomotor (PAA):22696}  Concentration:  {Concentration:21399}  Recall:  {BHH GOOD/FAIR/POOR:22877}  Fund of Knowledge: {BHH GOOD/FAIR/POOR:22877}  Language: {BHH GOOD/FAIR/POOR:22877}  Akathisia:  {BHH YES OR NO:22294}  Handed:  {Handed:22697}  AIMS (if indicated): {Desc; done/not:10129}  Assets:  {Assets (PAA):22698}  ADL's:  {BHH LKG'M:01027}  Cognition: {chl bhh cognition:304700322}  Sleep:  {BHH GOOD/FAIR/POOR:22877}   Screenings: [place] [facility] Visit from 11/27/2021 in [facility] Visit from 11/06/2021 in [facility] Office Visit from 10[facility]23/2021 in [facility] Office Visit from 10/16/2021 in [facility] Admission (Discharged) from 10/02/2021 in [place] [facility]  AIMS Total Score '4 7 5 21 '$ 0      Flowsheet Row Admission (Discharged) from 10/02/2021 in [facility] [place] [facility] ED from 09/27/2021 in [place] DEPT  C-SSRS RISK CATEGORY High Risk High Risk        Assessment and Plan: ***    [name], PA-C 01/29/2022, 3:13 PM

## 2022-02-05 ENCOUNTER — Encounter (HOSPITAL_COMMUNITY): Payer: Self-pay | Admitting: Medical

## 2022-02-05 ENCOUNTER — Ambulatory Visit (HOSPITAL_COMMUNITY): Payer: Medicare Other | Admitting: Medical

## 2022-02-05 DIAGNOSIS — N401 Enlarged prostate with lower urinary tract symptoms: Secondary | ICD-10-CM

## 2022-02-05 DIAGNOSIS — D330 Benign neoplasm of brain, supratentorial: Secondary | ICD-10-CM

## 2022-02-05 DIAGNOSIS — Z923 Personal history of irradiation: Secondary | ICD-10-CM

## 2022-02-05 DIAGNOSIS — F22 Delusional disorders: Secondary | ICD-10-CM

## 2022-02-05 DIAGNOSIS — I10 Essential (primary) hypertension: Secondary | ICD-10-CM

## 2022-02-05 DIAGNOSIS — F060 Psychotic disorder with hallucinations due to known physiological condition: Secondary | ICD-10-CM

## 2022-02-05 DIAGNOSIS — Z79899 Other long term (current) drug therapy: Secondary | ICD-10-CM

## 2022-02-05 DIAGNOSIS — G4710 Hypersomnia, unspecified: Secondary | ICD-10-CM

## 2022-02-05 DIAGNOSIS — G2401 Drug induced subacute dyskinesia: Secondary | ICD-10-CM

## 2022-02-05 DIAGNOSIS — F1921 Other psychoactive substance dependence, in remission: Secondary | ICD-10-CM

## 2022-02-05 DIAGNOSIS — I6381 Other cerebral infarction due to occlusion or stenosis of small artery: Secondary | ICD-10-CM

## 2022-02-05 DIAGNOSIS — I6930 Unspecified sequelae of cerebral infarction: Secondary | ICD-10-CM

## 2022-02-05 DIAGNOSIS — E039 Hypothyroidism, unspecified: Secondary | ICD-10-CM

## 2022-02-05 DIAGNOSIS — G250 Essential tremor: Secondary | ICD-10-CM

## 2022-02-05 DIAGNOSIS — L719 Rosacea, unspecified: Secondary | ICD-10-CM

## 2022-02-05 LAB — THYROID PANEL WITH TSH
Free Thyroxine Index: 1.1 — ABNORMAL LOW (ref 1.2–4.9)
T3 Uptake Ratio: 24 % (ref 24–39)
T4, Total: 4.6 ug/dL (ref 4.5–12.0)
TSH: 1.5 u[IU]/mL (ref 0.450–4.500)

## 2022-02-05 MED ORDER — THYROID 146.25 MG PO TABS: 146.2500 mg | ORAL_TABLET | Freq: Every day | ORAL | 2 refills | Status: DC

## 2022-02-05 NOTE — Progress Notes (Signed)
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 He was originally on 20 mg 10/13/2021 after failing Olanzapine and Risperdal and deteriorating living situation that led to Ectasy use after significant period of abstinence. He had a complete response to Haldol eliminating his auditory hallucinations. The Ectasy use resulted in Hospitalization that eventually led to his being released to live with his mother to help manage his medication use which at best was erratic when living alone.  The high dose of Haldol led to significant Akathasia/TD which was aggravated by newer TD medications but manageble with Benadryl as attempt to taper Haldol dose was started.10/23/2021. On 01/22/2022 he was started on 1 n mg of Haldol with continued remission of his hallucinosis. His physical appearance has greatly improved and his relationship with his mother has stabilized.He has become interested in Investment banker, corporate specifically development of a [employer] site and is currently seeking content.  He has limited physical exercise walking his dog. He has not attended any support group meetings. He continues with his Suboxone counseling program. He has not used any substance since the Ectasy this past June. He did finally get his Thyroid study done and it is barely normal with a low FTI. Attempts to repeat MRI of his brain tumor were unsuccessful due to his c/o not tolerating enclosed machine and then head holder (?He had multiple Gamma treatments in head holder without sedation) Given a recent CT that showed nothing of concern and no symptoms suggesting tun mor enlargement he agreed to forego further study at  this time as sedating him with his addiction history made the risk greater than benefit at this time.   Visit Diagnosis:    ICD-10-CM   1. Sequelae, post-stroke  I69.30     2. Multiple lacunar infarcts (HCC)  I63.81     3. 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 Persistent hypersomnia  G47.10     6. Polysubstance dependence in early, early partial, sustained full, or sustained partial remission ([place])  F19.21    Alcohol;Cocaine;Heroin/Fentanyl;Benzdiazepene Ectasy abuse    7. 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 Status post gamma knife treatment  Z92.3     9. Acquired hypothyroidism  E03.9     10. Familial tremor  G25.0     11. Rosacea  L71.9     12. Essential hypertension  I10     13. Medication management  Z79.899     14. Benign prostatic hyperplasia with lower urinary tract symptoms, symptom details unspecified  N40.1     15. Tardive dyskinesia  G24.01    Resolved      Past Psychiatric History:  [place] Date of recent admission: 10/29/21 Date of recent discharge: 11/01/21    [facility] [place] [facility] [place] 6/17/-03/04/2020  Pt with Opiate dependence MAT Suboxone at [facility] Dr Lynnda Shields C/O of paranoia he is aware of but cant stop/completely control.Has  rx for Risperdal from [employer]. Also hx of Gamma Knife tradiation fo benign brain tumor ?2018 Says has records Requesting Psychiatrist/medication mangement for paranoia hx [place] [facility] [place] 08/21/2021 - NOW [facility] INPATIENT  Date of Admission:  10/02/2021 Date of Discharge: 10/13/2021   [facility] Alcohol intoxication 03/26/2017  Alcohol withdrawal delirium 03/26/2017  Chronic daily headache 02/25/2017  Alcohol use disorder, severe, dependence 02/22/2017  Heroin dependence MAT Methadone  [place]/Subutex TBR 01/2018  Anxiety disorder, unspecified 01/20/2017  Paranoia 03/2018    [facility] Unk [place], NP - 04/12/2017 12:38 PM EST Formatting of this note might be different from the original. [facility] Psychiat[facility] - Substance Abuse PHP/IOP Intake Assessment  BH Formulate an individual and appropriate relapse prevention plan.   [place] SU Problem: Formation of a relapse prevention plan    [facility] Uncomplicated opioid dependence (CMS/HCC) 12/24/2017  1. Discharge from hospital. 2. Follow up at [facility].                        [facility]([place])- Records requested-never     received             12/15/2007 -05/21/2010   Past Medical History:  Past Medical History:  Diagnosis Date   ADHD    Anxiety    Brain tumor ([place])    balance and occ memory issues and headaches   Brain tumor ([place])    sees [facility] q year   Depression    Headache    migraine and tension   Hypothyroidism    Soft tissue mass    left shoulder   Substance abuse ([place])     Past Surgical History:  Procedure Laterality Date   colonscopy     gamma knife radiation 2018 for brain tumor'     hematoma removed from arm Left    MASS EXCISION Left 06/30/2019   Procedure: EXCISION OF SOFT TISSUE  MASS LEFT SHOULDER;  Surgeon: Armandina Gemma, MD;  Location: [place];  Service: General;  Laterality: Left;  LMA    Family Psychiatric History:  Alcoholism and drug abuse on father's side  Family History:   Hypertension Mother   Hypertension Father   Social History:  Social History          Socioeconomic History   Marital status: Single      Spouse name: Not on file   Number of children: Not on file   Years of education: Not on file   Highest education level: Not on file  Occupational History   Not on file  Tobacco Use   Smoking status: Former      Types: Cigarettes   Smokeless tobacco: Current   Tobacco  comments:      quit jan 2020  Vaping Use   Vaping Use: Every day  Substance and Sexual Activity   Alcohol use: Not Currently      Comment: quit Oct 2019   Drug use: Not Currently      Types: Cocaine, IV, Heroin, MDMA (Ecstacy)      Comment: last reported use; cocaine and heroin in 2019; MDMA Feb 2023   Sexual activity: Not Currently  Other Topics Concern   Not on file  Social History Narrative   Not on file    Social Determinants of Health     Difficulty of Paying Living Expenses: Disability  Food Insecurity:    Worried About Charity fundraiser in  the Last Year: On disability   Ran Out of Food in the Last Year:   Transportation Needs:    Film/video editor (Medical): Uses Ecologist (Non-Medical):   Physical Activity:    Days of Exercise per Week:    Minutes of Exercise per Session:   Stress:    Feeling of Stress : Constant   Social Connections:    Frequency of Communication with Friends and Family: Mother recently kicked him out of house   Frequency of Social Gatherings with Friends and Family: Lives alone-   Attends Religious Services: No   Active Member of Clubs or Organizations: Not active with AA /NA   Attends Engineer, production       Allergies:  Allergies  Allergen Reactions   Ingrezza [Valbenazine Tosylate] Other (See Comments)    Severe tongue chewing at 80 mg Tolerates lower doses   Naloxone Palpitations    Pt report MAT Rx is Subutex/Buprenorphine only   Amphetamine-Dextroamphetamine Other (See Comments)    Metabolic Disorder Labs: Lab Results  Component Value Date   HGBA1C 5.0 10/03/2021   MPG 96.8 10/03/2021   No results found for: "PROLACTIN" Lab Results  Component Value Date   CHOL 191 10/03/2021   TRIG 92 10/03/2021   HDL 48 10/03/2021   CHOLHDL 4.0 10/03/2021   VLDL 18 10/03/2021   LDLCALC 125 (H) 10/03/2021   Lab Results  Component Value Date   TSH 1.500 02/04/2022   TSH 0.417 (L) 11/10/2021     Current Medications: Current Outpatient Medications  Medication Sig Dispense Refill   buprenorphine (SUBUTEX) 8 MG SUBL SL tablet Place 24 mg under the tongue daily as needed.     diphenhydrAMINE (BENADRYL) 50 MG tablet Take 2-4 tablets q6h as needed for restlessness related to Haldol 100 tablet 2   doxycycline (VIBRAMYCIN) 50 MG capsule Take 1 capsule (50 mg total) by mouth daily. 90 capsule 1   gabapentin (NEURONTIN) 400 MG capsule Take 1 capsule (400 mg total) by mouth 2 (two) times daily for 180 doses. 180 capsule 0   haloperidol (HALDOL) 1 MG tablet Take 1 tablet (1 mg total) by mouth daily. 90 tablet 0   hydrOXYzine (VISTARIL) 25 MG capsule Take 1 capsule (25 mg total) by mouth 3 (three) times daily as needed for anxiety. 90 capsule 2   modafinil (PROVIGIL) 200 MG tablet Take 3 tablets (600 mg total) by mouth daily. 90 tablet 2   polyethylene glycol (MIRALAX / GLYCOLAX) 17 g packet Take 17 g by mouth daily. 30 each 0   propranolol (INDERAL) 20 MG tablet Take 20 mg by mouth 3 (three) times daily.     tamsulosin (FLOMAX) 0.4 MG CAPS capsule Take 2 capsules (0.8 mg total) by mouth daily after supper. 60 capsule 0   thyroid 146.25 MG TABS Take 146.25 mg by mouth daily before breakfast. 30 tablet 2   No current facility-administered medications for this visit.     Musculoskeletal: Strength & Muscle Tone: Telepsych visit-Grossly normal Musculoskeletal and cranial nerve inspections Gait & Station: NA Patient leans: N/A  Psychiatric Specialty Exam: Review of Systems  Constitutional:  Positive for fatigue (Chronic complaint). Negative for activity change, appetite change, chills, diaphoresis, fever and unexpected weight change.  HENT:  Negative for congestion, postnasal drip, rhinorrhea, sinus pressure, sinus pain, sneezing, sore throat, tinnitus, trouble swallowing and voice change.   Cardiovascular:  Negative for chest pain, palpitations and leg swelling.  Hypertension ? Says BP  normal  Not taking any meds at this time  Endocrine: Negative for cold intolerance, heat intolerance, polydipsia, polyphagia and polyuria.       Hypothyroid  Genitourinary:  Positive for difficulty urinating. Negative for decreased urine volume, dysuria, enuresis, flank pain, frequency, genital sores, hematuria, penile discharge, penile pain, penile swelling, scrotal swelling, testicular pain and urgency.       BPH  Neurological:  Negative for dizziness, tremors, seizures, syncope, facial asymmetry, speech difficulty, weakness, light-headedness, numbness and headaches.  Psychiatric/Behavioral:  Positive for sleep disturbance (ongoing since 2012). Negative for agitation, behavioral problems, confusion, decreased concentration, dysphoric mood, hallucinations, self-injury and suicidal ideas. The patient is not nervous/anxious and is not hyperactive.     There were no vitals taken for this visit.There is no height or weight on file to calculate BMI.MY CHART Visit  General Appearance: Casual and Well Groomed  Eye Contact:  Good  Speech:  Clear and Coherent and Normal Rate  Volume:  Normal  Mood:  Euthymic  Affect:  Appropriate and Congruent  Thought Process:  Coherent, Goal Directed, and Descriptions of Associations: Intact  Orientation:  Full (Time, Place, and Person)  Thought Content: WDL and Logical   Suicidal Thoughts:  No  Homicidal Thoughts:  No  Memory:  Unchanged    Immediate;   Fair Recent;   Fair Remote;   Fair  Judgement:  Other:  Improved  Insight:   Limited  Psychomotor Activity:  Normal  Concentration:  Concentration: Good and Attention Span: Good  Recall:      Limited  Fund of Knowledge:  WDL  Language: Good  Akathisia:  No  Handed:  Right  AIMS (if indicated): Grossly normal by video  Assets:  Desire for Improvement Financial Resources/Insurance Housing Resilience Social Support Talents/Skills Transportation Vocational/Educational  ADL's:  Intact  Cognition:  Impaired,  Mild and Moderate  Sleep:   Chronic impairment by hypersomnia   Screenings: [place] [facility] Visit from 11/27/2021 in [facility] Visit from 11/06/2021 in [facility] [facility] Visit from 10/23/2021 in [facility] [facility] Visit from 10/16/2021 in [facility] Admission (Discharged) from 10/02/2021 in [facility] [facility]  AIMS Total Score '4 7 5 21 '$ 0      Flowsheet Row Admission (Discharged) from 10/02/2021 in [facility] ED from 09/27/2021 in [facility]  C-SSRS RISK CATEGORY High Risk High Risk        Assessment  Marked improvement since hospitalization and Haldol medication.Hallucinosis fully remitted  Hypothyroid Needs more physical exercise and to attend recovery group Suboxone per [facility]  and Plan:  FU 2 weeks-sooner if needed Maintain Haldol at 1 mg QD Increase Armour Thyroid Order Recheck Thyroid labs in 2 weeks at FU Refill Modanafinil Increase walking Attend some online recovery meetings Suboxone per [facility], PA-C 02/05/2022, 5:05 PM

## 2022-02-11 ENCOUNTER — Telehealth (HOSPITAL_COMMUNITY): Payer: Self-pay | Admitting: *Deleted

## 2022-02-11 NOTE — Telephone Encounter (Signed)
Paul Bray form [facility] called to advise that Thyroid 146.25 is no longer available. She suggested to try Armour 180 mg, Armour 120 mg plus 30 mg scripts. Or possibly Armour 300 mg to take 1/2 tab daily. Please review and advise. I know we had some issues with getting the Armour approved in the past.

## 2022-02-11 NOTE — Telephone Encounter (Signed)
Oh boy. I know that.

## 2022-02-12 ENCOUNTER — Other Ambulatory Visit (HOSPITAL_COMMUNITY): Payer: Self-pay | Admitting: Medical

## 2022-02-12 ENCOUNTER — Ambulatory Visit (HOSPITAL_COMMUNITY): Payer: Medicare Other | Admitting: Medical

## 2022-02-12 MED ORDER — THYROID 300 MG PO TABS: ORAL_TABLET | ORAL | 2 refills | Status: DC

## 2022-02-12 NOTE — Progress Notes (Signed)
Patient ID: Paul Bray, male   DOB: [birth year], 56 y.o.   MRN: [id number] EPIC continues to carry nonmanufactured form of T3/4 Thyroid replacement Pharmacist suggested Armour Thyrid RX '300mg'$  1/2 tab QD Rx sent

## 2022-02-19 ENCOUNTER — Telehealth (HOSPITAL_BASED_OUTPATIENT_CLINIC_OR_DEPARTMENT_OTHER): Payer: Self-pay | Admitting: Medical

## 2022-02-19 ENCOUNTER — Encounter (HOSPITAL_COMMUNITY): Payer: Self-pay | Admitting: Medical

## 2022-02-19 DIAGNOSIS — E039 Hypothyroidism, unspecified: Secondary | ICD-10-CM

## 2022-02-19 DIAGNOSIS — L719 Rosacea, unspecified: Secondary | ICD-10-CM

## 2022-02-19 DIAGNOSIS — N401 Enlarged prostate with lower urinary tract symptoms: Secondary | ICD-10-CM

## 2022-02-19 DIAGNOSIS — D330 Benign neoplasm of brain, supratentorial: Secondary | ICD-10-CM

## 2022-02-19 DIAGNOSIS — I10 Essential (primary) hypertension: Secondary | ICD-10-CM

## 2022-02-19 DIAGNOSIS — I6381 Other cerebral infarction due to occlusion or stenosis of small artery: Secondary | ICD-10-CM

## 2022-02-19 DIAGNOSIS — F060 Psychotic disorder with hallucinations due to known physiological condition: Secondary | ICD-10-CM

## 2022-02-19 DIAGNOSIS — F1921 Other psychoactive substance dependence, in remission: Secondary | ICD-10-CM

## 2022-02-19 DIAGNOSIS — I6930 Unspecified sequelae of cerebral infarction: Secondary | ICD-10-CM

## 2022-02-19 DIAGNOSIS — Z923 Personal history of irradiation: Secondary | ICD-10-CM

## 2022-02-19 DIAGNOSIS — G250 Essential tremor: Secondary | ICD-10-CM

## 2022-02-19 DIAGNOSIS — G4710 Hypersomnia, unspecified: Secondary | ICD-10-CM

## 2022-02-19 NOTE — Progress Notes (Signed)
Patient ID: Paul Bray, male   DOB: Feb 19, 1966, 56 y.o.   MRN: [id number] No Show for Appointment (Video)

## 2022-02-26 ENCOUNTER — Telehealth (HOSPITAL_BASED_OUTPATIENT_CLINIC_OR_DEPARTMENT_OTHER): Payer: Medicare Other | Admitting: Medical

## 2022-02-26 ENCOUNTER — Encounter (HOSPITAL_COMMUNITY): Payer: Self-pay | Admitting: Medical

## 2022-02-26 DIAGNOSIS — I6381 Other cerebral infarction due to occlusion or stenosis of small artery: Secondary | ICD-10-CM

## 2022-02-26 DIAGNOSIS — F1921 Other psychoactive substance dependence, in remission: Secondary | ICD-10-CM

## 2022-02-26 DIAGNOSIS — F060 Psychotic disorder with hallucinations due to known physiological condition: Secondary | ICD-10-CM

## 2022-02-26 DIAGNOSIS — N401 Enlarged prostate with lower urinary tract symptoms: Secondary | ICD-10-CM

## 2022-02-26 DIAGNOSIS — L719 Rosacea, unspecified: Secondary | ICD-10-CM

## 2022-02-26 DIAGNOSIS — I6930 Unspecified sequelae of cerebral infarction: Secondary | ICD-10-CM

## 2022-02-26 DIAGNOSIS — I10 Essential (primary) hypertension: Secondary | ICD-10-CM

## 2022-02-26 DIAGNOSIS — D330 Benign neoplasm of brain, supratentorial: Secondary | ICD-10-CM

## 2022-02-26 DIAGNOSIS — Z923 Personal history of irradiation: Secondary | ICD-10-CM

## 2022-02-26 DIAGNOSIS — Z79899 Other long term (current) drug therapy: Secondary | ICD-10-CM

## 2022-02-26 DIAGNOSIS — Z8659 Personal history of other mental and behavioral disorders: Secondary | ICD-10-CM

## 2022-02-26 DIAGNOSIS — G4710 Hypersomnia, unspecified: Secondary | ICD-10-CM

## 2022-02-26 DIAGNOSIS — E039 Hypothyroidism, unspecified: Secondary | ICD-10-CM

## 2022-02-26 DIAGNOSIS — G250 Essential tremor: Secondary | ICD-10-CM

## 2022-02-26 MED ORDER — BUPROPION HCL ER (SR) 100 MG PO TB12: 100.0000 mg | ORAL_TABLET | Freq: Two times a day (BID) | ORAL | 1 refills | Status: DC

## 2022-02-26 NOTE — Progress Notes (Signed)
[place] MD/PA/NP OP Progress Note  02/26/2022 4:12 PM TAIQUAN CAMPANARO  MRN:  [id number] Virtual Visit via Video Note  I connected with [name] on 02/26/22 at  3:00 PM EDT by a video enabled telemedicine application and verified that I am speaking with the correct person using two identifiers.  Location: Patient: At home Provider: [place]   I discussed the limitations of evaluation and management by telemedicine and the availability of in person appointments. The patient expressed understanding and agreed to proceed.   History of Present Illness:See EPIC note    Observations/Objective:See EPIC note   Assessment and Plan:See EPIC note   Follow Up Instructions:See EPIC note I discussed the assessment and treatment plan with the patient. The patient was provided an opportunity to ask questions and all were answered. The patient agreed with the plan and demonstrated an understanding of the instructions.   The patient was advised to call back or seek an in-person evaluation if the symptoms worsen or if the condition fails to improve as anticipated.  I provided 25 minutes of non-face-to-face time during this encounter.   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 After his hospitalization in June of this year he was put on Haldol 20 mg QD ,having failed Risperdal and Zyprexa , His Psychosis cleared but he developed TDK rapidly.He was not able to tolerate the anti TDK meds so decision was made to manage him with Benadryl while tapering the Haldol dose.To date,  he has not had a recurrence of his hallucinosis and is stable at '1mg'$  daily He continues to complain of a chronic fatigue syndrome but also c/o of wanting awaken with energy/interset(?anhedonia) .He finds Modafanil is helping his attention as well as his hypersomnia. He has increased his Thyroid dose to 150 mg.He wont have any transportationfor a few weeks as his mother is having foot surgery next Wednesday. Despite complaints he informs that for the 1st time since 2011 he was able over the past month to sit and work on a project ie his You Tube site/channel and that he has increased traffic from 11 beforwe he got sick to over 400,000 hits.!  Visit Diagnosis:    ICD-10-CM   1. Polysubstance dependence in early, early partial, sustained full, or sustained partial remission ([place])  F19.21     2. Multiple lacunar infarcts (HCC)  I63.81     3. Sequelae, post-stroke  I69.30     4. 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 Persistent hypersomnia  G47.10     6. Benign neoplasm of supratentorial region of brain ([place])  D33.0     7. Status post gamma knife treatment  Z92.3     8. Acquired hypothyroidism  E03.9     9. Familial tremor  G25.0     10. Rosacea  L71.9     11. Essential hypertension  I10     12. Benign prostatic hyperplasia with lower urinary tract symptoms, symptom details  unspecified  N40.1     13. Medication management  Z79.899     14. History of ADHD  Z86.59       Past Psychiatric History:  [place] Date of recent admission: 10/29/21 Date of recent discharge: 11/01/21    [facility] [facility] [place] 6/17/-03/04/2020  Pt with Opiate dependence MAT Suboxone at [facility] Dr Lynnda Shields C/O of paranoia he is aware of but cant stop/completely control.Has rx for Risperdal from [employer]. Also hx of Gamma Knife tradiation fo benign brain tumor ?2018 Says has  records Requesting Psychiatrist/medication mangement for paranoia hx [place] [facility] [place] 08/21/2021 - NOW [facility] INPATIENT  Date of Admission:  10/02/2021 Date of Discharge: 10/13/2021   [facility] Alcohol intoxication 03/26/2017  Alcohol withdrawal delirium 03/26/2017  Chronic daily headache 02/25/2017  Alcohol use disorder, severe, dependence 02/22/2017  Heroin dependence MAT Methadone [place]/Subutex TBR 01/2018  Anxiety disorder, unspecified 01/20/2017  Paranoia 03/2018    [facility] Unk Pinto, NP - 04/12/2017 12:38 PM EST Formatting of this note might be different from the original. [facility] - Substance Abuse PHP/IOP Intake Assessment  BH Formulate an individual and appropriate relapse prevention plan.   [place] SU Problem: Formation of a relapse prevention plan    [facility] Uncomplicated opioid dependence (CMS/HCC) 12/24/2017  1. Discharge from hospital. 2. Follow up at [facility].                        [facility])- Records requested-never     received             12/15/2007 -01/25/201 Past Medical History:  Past Medical History:  Diagnosis Date   ADHD    Anxiety    Brain tumor ([place])    balance and occ memory issues and headaches   Brain tumor ([place])    sees [facility] q year   Depression    Headache    migraine and tension   Hypothyroidism    Soft tissue mass    left shoulder   Substance abuse ([place])     Past Surgical History:  Procedure Laterality Date   colonscopy     gamma knife radiation 2018 for brain tumor'     hematoma removed from arm Left    MASS EXCISION Left 06/30/2019   Procedure: EXCISION OF SOFT TISSUE  MASS LEFT SHOULDER;  Surgeon: Armandina Gemma, MD;  Location: [place];  Service: General;  Laterality: Left;  LMA    Family Psychiatric History:  Alcoholism and drug abuse on father's side   Family History:   Hypertension Mother   Hypertension Father    Social History:       Socioeconomic History   Marital status: Single      Spouse name: Not on file   Number of children: Not on file   Years of education: Not on file   Highest education level: Not on file  Occupational History   Not on file  Tobacco Use   Smoking status: Former      Types: Cigarettes   Smokeless tobacco: Current   Tobacco comments:      quit jan 2020  Vaping Use   Vaping Use: Every day  Substance and Sexual Activity   Alcohol use: Not Currently      Comment: quit Oct 2019   Drug use: Not Currently      Types: Cocaine, IV,  Heroin, MDMA (Ecstacy)      Comment: last reported use; cocaine and heroin in 2019; MDMA Feb 2023   Sexual activity: Not Currently  Other Topics Concern   Not on file  Social History Narrative   Not on file    Social Determinants of Health     Difficulty of Paying Living Expenses: Disability  Food Insecurity:    Worried About [place] in the Last Year: On disability   Ran Out of Food in the Last Year:   Transportation Needs:    Film/video editor (Medical): Uses [employer] (Non-Medical):   Physical Activity:    Days of Exercise per Week:    Minutes of Exercise per Session:   Stress:    Feeling of Stress : Constant   Social Connections:    Frequency of Communication with Friends and Family: Mother recently kicked him out of house   Frequency of Social Gatherings with Friends and Family: Lives alone-   Attends Religious Services: No   Active Member of Clubs or Organizations: Not active with AA /NA   Attends Engineer, production    Allergies:  Allergies  Allergen Reactions   Ingrezza [Valbenazine Tosylate] Other (See Comments)    Severe tongue chewing at 80 mg Tolerates lower doses   Naloxone Palpitations    Pt report MAT Rx is Subutex/Buprenorphine only   Amphetamine-Dextroamphetamine Other (See Comments)    Metabolic Disorder Labs: Lab Results  Component Value Date    HGBA1C 5.0 10/03/2021   MPG 96.8 10/03/2021   No results found for: "PROLACTIN" Lab Results  Component Value Date   CHOL 191 10/03/2021   TRIG 92 10/03/2021   HDL 48 10/03/2021   CHOLHDL 4.0 10/03/2021   VLDL 18 10/03/2021   LDLCALC 125 (H) 10/03/2021   Lab Results  Component Value Date   TSH 1.500 02/04/2022   TSH 0.417 (L) 11/10/2021    Therapeutic Level Labs:NA  Current Medications: Current Outpatient Medications  Medication Sig Dispense Refill   buPROPion ER (WELLBUTRIN SR) 100 MG 12 hr tablet Take 1 tablet (100 mg total) by mouth 2 (two) times daily. 60 tablet 1   tamsulosin (FLOMAX) 0.4 MG CAPS capsule Take by mouth.     buprenorphine (SUBUTEX) 8 MG SUBL SL tablet Place 24 mg under the tongue daily as needed.     diphenhydrAMINE (BENADRYL) 50 MG tablet Take 2-4 tablets q6h as needed for restlessness related to Haldol 100 tablet 2   doxycycline (VIBRAMYCIN) 50 MG capsule Take 1 capsule (50 mg total) by mouth daily. 90 capsule 1   gabapentin (NEURONTIN) 400 MG capsule Take 1 capsule (400 mg total) by mouth 2 (two) times daily for 180 doses. 180 capsule 0   haloperidol (HALDOL) 1 MG tablet Take 1 tablet (1 mg total) by mouth daily. 90 tablet 0   hydrOXYzine (VISTARIL) 25 MG capsule Take 1 capsule (25 mg total) by mouth 3 (three) times daily as needed for anxiety. 90 capsule 2   modafinil (PROVIGIL) 200 MG tablet Take 3 tablets (600 mg total) by mouth daily. 90 tablet 2   polyethylene glycol (MIRALAX / GLYCOLAX) 17 g packet Take 17 g by mouth daily. 30 each 0   propranolol (INDERAL) 20 MG tablet Take 20 mg by mouth 3 (three) times daily.     thyroid (ARMOUR) 300 MG tablet Take 1/2 tablet daily 20 minutes before breakfast 15 tablet 2   No current facility-administered medications  for this visit.   Musculoskeletal: Strength & Muscle Tone: Telepsych visit-Grossly normal Musculoskeletal and cranial nerve inspections Gait & Station: NA Patient leans: N/A  Psychiatric  Specialty Exam: Review of Systems  Constitutional:  Positive for malaise/fatigue (Chronic). Negative for chills, diaphoresis, fever and weight loss.  HENT:  Negative for congestion, ear pain, hearing loss, nosebleeds, sinus pain and sore throat.   Eyes:  Negative for blurred vision, double vision, photophobia, pain, discharge and redness.  Respiratory:  Negative for cough, hemoptysis, sputum production, shortness of breath, wheezing and stridor.   Cardiovascular:  Negative for chest pain, palpitations, orthopnea, claudication, leg swelling and PND.  Gastrointestinal:  Negative for abdominal pain, blood in stool, constipation, diarrhea, heartburn, melena, nausea and vomiting.  Genitourinary:  Negative for dysuria, flank pain, frequency, hematuria and urgency.       BPH rx Tamulosin  Musculoskeletal:  Negative for back pain, falls, joint pain, myalgias and neck pain.  Skin:  Negative for itching and rash.  Neurological:  Positive for tremors (Familial). Negative for dizziness, tingling, sensory change, speech change, focal weakness, seizures, loss of consciousness, weakness and headaches.  Endo/Heme/Allergies:  Negative for environmental allergies and polydipsia. Does not bruise/bleed easily.  Psychiatric/Behavioral:  Positive for memory loss. Negative for depression, hallucinations, substance abuse and suicidal ideas. The patient has insomnia. The patient is not nervous/anxious.     There were no vitals taken for this visit.Telepsych visit  General Appearance: Casual and Well Groomed  Eye Contact:  Good  Speech:  Clear and Coherent  Volume:  Normal  Mood:  Euthymic  Affect:  Appropriate and Congruent  Thought Process:  Coherent, Goal Directed, and Descriptions of Associations: Intact  Orientation:  Full (Time, Place, and Person)  Thought Content: WDL and Logical   Suicidal Thoughts:  No  Homicidal Thoughts:  No  Memory:   Affected by his history of addiction /Stroke but currently intact   Judgement:  Fair  Insight:  Fair  Psychomotor Activity:  Normal for video view  Concentration:  Concentration: Good and Attention Span: Good  Recall:  Negative  Fund of Knowledge:  WDL  Language: Good  Akathisia:  Negative  Handed:  Right  AIMS (if indicated): Telepsych visit limited view WNL  Assets:  Desire for Improvement Financial Resources/Insurance Housing Resilience Social Support Talents/Skills Vocational/Educational  ADL's:  Impaired chronic fatigue  Cognition: Impaired,  Mild  Sleep:   Hypersomnia rx Modonafil   Screenings: [place] Office Visit from 11/27/2021 in [facility] Visit from 11/06/2021 in [facility] Visit from 10/23/2021 in [place] [facility] Office Visit from 10/16/2021 in [facility] Admission (Discharged) from 10/02/2021 in [facility]  AIMS Total Score '4 7 5 21 '$ 0      Flowsheet Row Admission (Discharged) from 10/02/2021 in [facility] ED from 09/27/2021 in [place] High Risk [place]: [place] Office Visit from 11/27/2021 in [facility] [facility] Visit from 11/06/2021 in [facility] [facility] Visit from 10/23/2021 in [facility] Visit from 10/16/2021 in [place] [facility] Admission (Discharged) from 10/02/2021 in [place] [facility]  AIMS Total Score '4 7 5 21 '$ 0      Flowsheet Row Admission (Discharged) from  10/02/2021 in [facility] [facility] ED from 09/27/2021 in [place] DEPT  C-SSRS RISK CATEGORY High  Risk High Risk      Pt has had 3 attempts at [facility] that were never completed for reasons that are not documented on website  Assessment : Continues to show improvement . Fatigue/?Anhedonia complaints persist  and Plan: Continue current treatment. Discussed need for completed [place] study and repeat Thyroid panel When transportation available. Discussed trial of Wellbutrin for c/o fatigue/anhedonia. Pt wants to try but will stop immediately if it interferes with current success.                [name], PA-C 02/26/2022, 4:12 PM

## 2022-03-12 ENCOUNTER — Telehealth (HOSPITAL_BASED_OUTPATIENT_CLINIC_OR_DEPARTMENT_OTHER): Payer: Medicare Other | Admitting: Medical

## 2022-03-12 DIAGNOSIS — D330 Benign neoplasm of brain, supratentorial: Secondary | ICD-10-CM

## 2022-03-12 DIAGNOSIS — Z923 Personal history of irradiation: Secondary | ICD-10-CM

## 2022-03-12 DIAGNOSIS — D320 Benign neoplasm of cerebral meninges: Secondary | ICD-10-CM

## 2022-03-12 DIAGNOSIS — F060 Psychotic disorder with hallucinations due to known physiological condition: Secondary | ICD-10-CM

## 2022-03-12 DIAGNOSIS — Z79899 Other long term (current) drug therapy: Secondary | ICD-10-CM

## 2022-03-12 DIAGNOSIS — G4710 Hypersomnia, unspecified: Secondary | ICD-10-CM | POA: Diagnosis not present

## 2022-03-12 DIAGNOSIS — I6381 Other cerebral infarction due to occlusion or stenosis of small artery: Secondary | ICD-10-CM | POA: Diagnosis not present

## 2022-03-12 DIAGNOSIS — F22 Delusional disorders: Secondary | ICD-10-CM

## 2022-03-12 DIAGNOSIS — L719 Rosacea, unspecified: Secondary | ICD-10-CM

## 2022-03-12 DIAGNOSIS — Z8659 Personal history of other mental and behavioral disorders: Secondary | ICD-10-CM

## 2022-03-12 DIAGNOSIS — I10 Essential (primary) hypertension: Secondary | ICD-10-CM

## 2022-03-12 DIAGNOSIS — F1921 Other psychoactive substance dependence, in remission: Secondary | ICD-10-CM

## 2022-03-12 DIAGNOSIS — N401 Enlarged prostate with lower urinary tract symptoms: Secondary | ICD-10-CM

## 2022-03-12 DIAGNOSIS — I6930 Unspecified sequelae of cerebral infarction: Secondary | ICD-10-CM

## 2022-03-12 DIAGNOSIS — G250 Essential tremor: Secondary | ICD-10-CM

## 2022-03-12 DIAGNOSIS — F1610 Hallucinogen abuse, uncomplicated: Secondary | ICD-10-CM

## 2022-03-12 DIAGNOSIS — E039 Hypothyroidism, unspecified: Secondary | ICD-10-CM

## 2022-03-12 MED ORDER — MODAFINIL 200 MG PO TABS: 600.0000 mg | ORAL_TABLET | Freq: Every day | ORAL | 2 refills | Status: DC

## 2022-03-12 MED ORDER — HALOPERIDOL 1 MG PO TABS: 1.0000 mg | ORAL_TABLET | Freq: Every day | ORAL | 0 refills | Status: DC

## 2022-03-12 MED ORDER — HYDROXYZINE PAMOATE 25 MG PO CAPS: 25.0000 mg | ORAL_CAPSULE | Freq: Three times a day (TID) | ORAL | 2 refills | Status: AC | PRN

## 2022-03-12 NOTE — Progress Notes (Signed)
[place] MD/PA/NP OP Progress Note  03/12/2022 4:11 PM Paul Bray  MRN:  [id number] Virtual Visit via Video Note  I connected with Paul Bray on 03/12/22 at  4:00 PM EST by a video enabled telemedicine application and verified that I am speaking with the correct person using two identifiers.  Location: Patient: Home Provider:  [facility]   I discussed the limitations of evaluation and management by telemedicine and the availability of in person appointments. The patient expressed understanding and agreed to proceed.   History of Present Illness:See EPIC note    Observations/Objective:See EPIC note   Assessment and Plan:See EPIC note   Follow Up Instructions:See EPIC note   I discussed the assessment and treatment plan with the patient. The patient was provided an opportunity to ask questions and all were answered. The patient agreed with the plan and demonstrated an understanding of the instructions.   The patient was advised to call back or seek an in-person evaluation if the symptoms worsen or if the condition fails to improve as anticipated.  I provided 20 minutes of non-face-to-face time during this encounter.   [name], PA-C   Chief Complaint:  Chief Complaint  Patient presents with   Follow-up   Addiction Problem   pOST STROKE PSYCHOSIS   Medication Refill   Fatigue   hYPERSOMNIA   HPI: Paul Bray continues to improve inmental clarity and function with an ongoing and growing You Tube site. He does continue to have c/o chronic fatigue, His mom has had foot surgery so he is helping out around house. He will go fo FU Thyroid study when she is ambulatory to drive. He continues to [facility] and is in compliance there. Continues to play Guitar and walk the dog. Continues to remark on his ability to work at computer and on his progress. He continues to be free of hallucinations. He is not craving any drugs.  Visit Diagnosis:    ICD-10-CM    1. Polysubstance dependence in early, early partial, sustained full, or sustained partial remission ([place])  F19.21     2. Multiple lacunar infarcts (HCC)  I63.81     3. Sequelae, post-stroke  I69.30     4. 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 Persistent hypersomnia  G47.10     6. 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 Meningioma, cerebral ([place])  D32.0     8. Status post gamma knife treatment  Z92.3     9. Acquired hypothyroidism  E03.9     10. Familial tremor  G25.0     11. Rosacea  L71.9     12. Essential hypertension  I10     13. 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 Medication management  Z79.899     15. History of ADHD  Z86.59     16. Benign prostatic hyperplasia with lower urinary tract symptoms, symptom details unspecified  N40.1     17. 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 Dr Lynnda Shields C/O of paranoia he is aware of but cant stop/completely  control.Has rx for Risperdal from [employer] [employer]. Also hx of Gamma Knife tradiation fo benign brain tumor ?2018 Says has records Requesting Psychiatrist/medication mangement for paranoia hx [facility] [place] 08/21/2021 - NOW [facility] INPATIENT  Date of Admission:  10/02/2021 Date of Discharge: 10/13/2021   [facility] Alcohol intoxication 03/26/2017  Alcohol withdrawal delirium 03/26/2017  Chronic daily headache 02/25/2017  Alcohol use disorder, severe, dependence 02/22/2017  Heroin dependence MAT Methadone [facility]/Subutex TBR 01/2018  Anxiety disorder, unspecified 01/20/2017  Paranoia 03/2018    [facility] Unk Pinto,  NP - 04/12/2017 12:38 PM EST Formatting of this note might be different from the original. [facility] - Substance Abuse PHP/IOP Intake Assessment  BH Formulate an individual and appropriate relapse prevention plan.   [place] SU Problem: Formation of a relapse prevention plan    [facility] Uncomplicated opioid dependence (CMS/HCC) 12/24/2017  1. Discharge from hospital. 2. Follow up at [facility].    [facility]([place])- Records requested-never     receive   12/15/2007 -01/25/201  Past Medical History:  Past Medical History:  Diagnosis Date   ADHD    Anxiety    Brain tumor ([place])    balance and occ memory issues and headaches   Brain tumor ([place])    sees [facility] q year   Depression    Headache    migraine and tension   Hypothyroidism    Soft tissue mass    left shoulder   Substance abuse ([place])     Past Surgical History:  Procedure Laterality Date   colonscopy     gamma knife radiation 2018 for brain tumor'     hematoma removed from arm Left    MASS EXCISION Left 06/30/2019   Procedure: EXCISION OF SOFT TISSUE  MASS LEFT SHOULDER;  Surgeon: Armandina Gemma, MD;  Location: [facility];  Service: General;  Laterality: Left;  LMA    Family Psychiatric History:  Alcoholism and drug abuse on father's side    Family History:   Hypertension Mother   Hypertension Father    Social History:  Social History   Socioeconomic History   Marital status: Single    Spouse name: Not on file   Number of children: Not on file   Years of education: Not on file   Highest education level: Not on file  Occupational History   Not on file  Tobacco Use   Smoking status: Former    Types: Cigarettes   Smokeless tobacco: Current   Tobacco comments:    quit jan 2020  Vaping Use   Vaping Use: Every day  Substance and Sexual Activity   Alcohol use: Not Currently    Comment: quit Oct 2019   Drug use: Not Currently     Types: Cocaine, IV, Heroin, MDMA (Ecstacy)    Comment: last reported use; cocaine and heroin in 2019; MDMA Feb 2023   Sexual activity: Not Currently  Other Topics Concern   Not on file  Social History Narrative   Living with mother now   Social Determinants of Health    Difficulty of Paying Living Expenses: Disability  Food Insecurity:    Worried About Charity fundraiser in the Last Year: On disability   Arboriculturist in the Last Year:   Transportation Needs:    Film/video editor (Medical): Uses [employer]    Lack of Transportation (Non-Medical):   Physical Activity:    Days of Exercise per Week:  Minutes of Exercise per Session:   Stress:    Feeling of Stress : Constant   Social Connections:    Frequency of Communication with Friends and Family: Mother recently kicked him out of house   Frequency of Social Gatherings with Friends and Family: Lives alone-   Attends Religious Services: No   Active Member of Clubs or Organizations: Not active with AA /NA   Attends Engineer, production   Allergies:  Allergies  Allergen Reactions   Ingrezza [Valbenazine Tosylate] Other (See Comments)    Severe tongue chewing at 80 mg Tolerates lower doses   Naloxone Palpitations    Pt report MAT Rx is Subutex/Buprenorphine only   Amphetamine-Dextroamphetamine Other (See Comments)    Metabolic Disorder Labs: Lab Results  Component Value Date   HGBA1C 5.0 10/03/2021   MPG 96.8 10/03/2021   No results found for: "PROLACTIN" Lab Results  Component Value Date   CHOL 191 10/03/2021   TRIG 92 10/03/2021   HDL 48 10/03/2021   CHOLHDL 4.0 10/03/2021   VLDL 18 10/03/2021   LDLCALC 125 (H) 10/03/2021   Lab Results  Component Value Date   TSH 1.500 02/04/2022   TSH 0.417 (L) 11/10/2021    Therapeutic Level Labs:NA   Current Medications: Current Outpatient Medications  Medication Sig Dispense Refill   buprenorphine (SUBUTEX) 8 MG SUBL SL tablet Place 24 mg under  the tongue daily as needed.     buPROPion ER (WELLBUTRIN SR) 100 MG 12 hr tablet Take 1 tablet (100 mg total) by mouth 2 (two) times daily. 60 tablet 1   diphenhydrAMINE (BENADRYL) 50 MG tablet Take 2-4 tablets q6h as needed for restlessness related to Haldol 100 tablet 2   doxycycline (VIBRAMYCIN) 50 MG capsule Take 1 capsule (50 mg total) by mouth daily. 90 capsule 1   gabapentin (NEURONTIN) 400 MG capsule Take 1 capsule (400 mg total) by mouth 2 (two) times daily for 180 doses. 180 capsule 0   haloperidol (HALDOL) 1 MG tablet Take 1 tablet (1 mg total) by mouth daily. 90 tablet 0   hydrOXYzine (VISTARIL) 25 MG capsule Take 1 capsule (25 mg total) by mouth 3 (three) times daily as needed for anxiety. 90 capsule 2   modafinil (PROVIGIL) 200 MG tablet Take 3 tablets (600 mg total) by mouth daily. 90 tablet 2   polyethylene glycol (MIRALAX / GLYCOLAX) 17 g packet Take 17 g by mouth daily. 30 each 0   propranolol (INDERAL) 20 MG tablet Take 20 mg by mouth 3 (three) times daily.     tamsulosin (FLOMAX) 0.4 MG CAPS capsule Take by mouth.     thyroid (ARMOUR) 300 MG tablet Take 1/2 tablet daily 20 minutes before breakfast 15 tablet 2   No current facility-administered medications for this visit.     Musculoskeletal: Strength & Muscle Tone: Telepsych visit-Grossly normal Musculoskeletal and cranial nerve inspections Gait & Station: NA Patient leans: N/A  Psychiatric Specialty Exam: Review of Systems  Constitutional:  Positive for activity change (cONTINUES TO GROW HIS yOU TUBE SITE) and fatigue (Chronic). Negative for appetite change, chills, diaphoresis, fever and unexpected weight change.  HENT:  Negative for congestion, postnasal drip, rhinorrhea, sinus pressure, sinus pain, sneezing, sore throat, tinnitus, trouble swallowing and voice change.   Respiratory:  Negative for apnea, cough, choking, chest tightness, shortness of breath, wheezing and stridor.   Gastrointestinal: Negative.    Endocrine: Negative for cold intolerance, heat intolerance, polydipsia, polyphagia and polyuria.  Genitourinary:  Positive for  difficulty urinating (Rx Tamulosin). Negative for decreased urine volume, dysuria, enuresis, flank pain, frequency, genital sores, hematuria, penile discharge, penile pain, penile swelling, scrotal swelling, testicular pain and urgency.       BPH  Neurological:  Negative for dizziness, tremors, seizures, syncope, facial asymmetry, speech difficulty, weakness, light-headedness, numbness and headaches.  Psychiatric/Behavioral:  Negative for agitation, behavioral problems, confusion, decreased concentration, dysphoric mood, hallucinations, self-injury, sleep disturbance and suicidal ideas. The patient is not nervous/anxious and is not hyperactive.     There were no vitals taken for this visit.There is no height or weight on file to calculate BMI.My Chart Visit  General Appearance: Casual  Eye Contact:  Good  Speech:  Clear and Coherent and Normal Rate  Volume:  Normal  Mood:  Euthymic  Affect:  Appropriate and Congruent  Thought Process:  Coherent, Goal Directed, and Descriptions of Associations: Intact  Orientation:  Full (Time, Place, and Person)  Thought Content: WDL   Suicidal Thoughts:  No  Homicidal Thoughts:  No  Memory:      Affected by his history of addiction /Stroke but currently intact   Judgement:  Other:  Improved  Insight:   Improved  Psychomotor Activity:  Normal  Concentration:  Concentration: Good and Attention Span: Good  Recall:   See [employer] of Knowledge:  WDL  Language: Good  Akathisia:  Negative  Handed:  Right  AIMS (if indicated): Grossly normal  Assets:  Communication Skills Desire for Improvement Financial Resources/Insurance Housing Resilience Social Support Talents/Skills Transportation Vocational/Educational  ADL's:  Intact  Cognition: Impaired,  Mild  Sleep:   Chronic hypersomnia   Screenings: [place] [facility] Visit from 11/27/2021 in [facility] Visit from 11/06/2021 in [facility] Visit from 10/23/2021 in [facility] Visit from 10/16/2021 [facility] Admission (Discharged) from 10/02/2021 in [facility]  AIMS Total Score '4 7 5 21 '$ 0      Flowsheet Row Admission (Discharged) from 10/02/2021 in [facility] ED from 09/27/2021 in [facility]  C-SSRS RISK CATEGORY High Risk High Risk        Assessment Continued improvement. Still some fatigue/hypersomnia.No further psychosis     and Plan: Continue current medications. Pt requests FU 2 weeks-may come sooner if neded   [name], PA-C 03/12/2022, 4:11 PM

## 2022-03-13 ENCOUNTER — Encounter (HOSPITAL_COMMUNITY): Payer: Self-pay | Admitting: Medical

## 2022-03-26 ENCOUNTER — Telehealth (HOSPITAL_BASED_OUTPATIENT_CLINIC_OR_DEPARTMENT_OTHER): Payer: Medicare Other | Admitting: Medical

## 2022-03-26 ENCOUNTER — Encounter (HOSPITAL_COMMUNITY): Payer: Self-pay | Admitting: Medical

## 2022-03-26 DIAGNOSIS — E039 Hypothyroidism, unspecified: Secondary | ICD-10-CM

## 2022-03-26 DIAGNOSIS — I6930 Unspecified sequelae of cerebral infarction: Secondary | ICD-10-CM | POA: Diagnosis not present

## 2022-03-26 DIAGNOSIS — Z79899 Other long term (current) drug therapy: Secondary | ICD-10-CM

## 2022-03-26 DIAGNOSIS — F060 Psychotic disorder with hallucinations due to known physiological condition: Secondary | ICD-10-CM | POA: Diagnosis not present

## 2022-03-26 DIAGNOSIS — D320 Benign neoplasm of cerebral meninges: Secondary | ICD-10-CM

## 2022-03-26 DIAGNOSIS — G4710 Hypersomnia, unspecified: Secondary | ICD-10-CM

## 2022-03-26 DIAGNOSIS — I6381 Other cerebral infarction due to occlusion or stenosis of small artery: Secondary | ICD-10-CM

## 2022-03-26 DIAGNOSIS — L719 Rosacea, unspecified: Secondary | ICD-10-CM

## 2022-03-26 DIAGNOSIS — D330 Benign neoplasm of brain, supratentorial: Secondary | ICD-10-CM

## 2022-03-26 DIAGNOSIS — Z923 Personal history of irradiation: Secondary | ICD-10-CM

## 2022-03-26 DIAGNOSIS — F1921 Other psychoactive substance dependence, in remission: Secondary | ICD-10-CM

## 2022-03-26 DIAGNOSIS — G250 Essential tremor: Secondary | ICD-10-CM

## 2022-03-26 DIAGNOSIS — I10 Essential (primary) hypertension: Secondary | ICD-10-CM

## 2022-03-26 NOTE — Progress Notes (Signed)
[facility] MD/PA/NP OP Progress Note  03/26/2022 2:32 PM Paul Bray  MRN:  [id number] Virtual Visit via Video Note  I connected with Paul Bray on 03/26/22 at  2:00 PM EST by a video enabled telemedicine application and verified that I am speaking with the correct person using two identifiers.  Location: Patient: At home Provider: At [facility]   I discussed the limitations of evaluation and management by telemedicine and the availability of in person appointments. The patient expressed understanding and agreed to proceed.   History of Present Illness:See EPIC note    Observations/Objective:See EPIC note   Assessment and Plan:See EPIC note   Follow Up Instructions:See EPIC note    I discussed the assessment and treatment plan with the patient. The patient was provided an opportunity to ask questions and all were answered. The patient agreed with the plan and demonstrated an understanding of the instructions.   The patient was advised to call back or seek an in-person evaluation if the symptoms worsen or if the condition fails to improve as anticipated.  I provided 20 minutes of non-face-to-face time during this encounter.   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 In addition he has chronic hypersomnia/fatigue treated currently with high dose Modanafil.and Hypothyroidism with suspected T3 deficiency and rx Armour Thyroid. He continues to feel he needs a higher dose of thyroid medication but he has been unable to get FU labs due to transportation  (his mother isw currently laid up with foot surgery).  He continues to reside at home with his mother and their relationship  has improved vastly as has Paul Bray's ability to work with his computer on [employer]. He is now contemplating doing a Podcast. He has not seen his PCP recently.  Visit Diagnosis:    ICD-10-CM   1. Polysubstance dependence in early, early partial, sustained full, or sustained partial remission ([facility])  F19.21     2. Multiple lacunar infarcts (HCC)  I63.81     3. Sequelae, post-stroke  I69.30     4. 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 Persistent hypersomnia  G47.10     6. 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 Meningioma, cerebral ([place])  D32.0     8. Status post gamma knife treatment  Z92.3     9. Acquired hypothyroidism  E03.9     10. Familial tremor  G25.0     11. Rosacea  L71.9     12. Essential hypertension  I10     13. Medication management  Z79.899       Past Psychiatric History:  Care Everywhere reviewed-No New Info 03/26/2022 but old records from [facility] are now present and copied below for his record [place] Date of recent admission: 10/29/21 Date of recent discharge: 11/01/21 [facility] [place] 6/17/-03/04/2020  Pt with Opiate dependence MAT Suboxone at [facility] Dr Lynnda Shields C/O of paranoia he is aware of but cant stop/completely control.Has rx for Risperdal from [employer]. Also hx of Gamma  Knife tradiation fo benign brain tumor ?2018 Says has records Requesting Psychiatrist/medication mangement for paranoia hx [facility] [place] 08/21/2021 - NOW [facility] INPATIENT  Date of Admission:  10/02/2021 Date of Discharge: 10/13/2021   [facility] Alcohol intoxication 03/26/2017  Alcohol withdrawal delirium 03/26/2017  Chronic daily headache 02/25/2017  Alcohol use disorder, severe, dependence 02/22/2017  Heroin dependence MAT Methadone [facility]/Subutex TBR 01/2018  Anxiety disorder, unspecified 01/20/2017  Paranoia 03/2018    [facility] Unk Pinto, NP - 04/12/2017 12:38 PM EST Formatting of this note might be different from the original. [facility] Psychiatry - Substance Abuse PHP/IOP Intake Assessment  BH Formulate an individual and appropriate relapse prevention plan.   [address] SU Problem: Formation of a relapse prevention plan    [facility] Uncomplicated opioid dependence (CMS/HCC) 12/24/2017  1. Discharge from hospital. 2. Follow [facility] at [facility]     Past Medical History:  Diagnosis Date   ADHD    Anxiety    Brain tumor ([place])    balance and occ memory issues and headaches   Brain tumor ([place])    sees [facility] q year   Depression    Headache    migraine and tension   Hypothyroidism    Soft tissue mass    left shoulder   Substance abuse [facility])    Neurosurgery ZAKK, BORGEN [facility]# [id number] DOB: [birth year] Visit Date: 05/01/2010 May 01, 2010  [facility] [address], [address] [place] [address] Dear Doctors: I saw Paul Bray in the office today for a neurosurgical evaluation. I have enclosed a copy of the comprehensive report of that consultation.  In short, he is a 56 year old gentleman who is here for a second opinion. He has had a known brain lesion that has been followed since July of 2010. His primary symptom seems to be hypersomnia. 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 The patient, however, has been convinced that a lesion that was seen on the MRI scan of his brain was responsible for his symptoms, and he is here for an additional opinion.  His neurologic exam is really entirely normal. He has a normal funduscopic exam, normal mental status. Cranial nerves are unremarkable with no real focal  neurologic deficits. I reviewed a series of MRI scans on him dating from July of 2010 to current. He has an oval, smooth, rounded lesion within the right lateral ventricle, in the posterior ports in the lateral ventricle that seems to be on the medial side of the choroid. It is slightly less than 2 cm in greatest diameter. It, since July of 2010 until now, has not changed in size.  I have explained to the patient that I do not really think that this lesion necessarily is related to his symptoms. Even if it were, I do not think that trying to address this lesion from a surgical standpoint would necessarily result in improvement of his symptoms and certainly would carry a significant degree of potential morbidity, given its location and its proximity to the internal cerebral vein. I have recommended just continued observation. I have told him I would be happy to see him back in a year's time with a followup MRI scan.  I will keep in touch with you when I do  see him back. In the meantime best wishes and thanks for allowing me to participate in his care.  Sincerely,  John A. Redmond Pulling, M.D., F.A.C.S. Attending Physician Fax: [phone]  MRI Martin, Apr 25, 2010 03:36:00 PM  .  INDICATION:  BRAIN TUMOR                 [id number] IDX VISIT #  [id number] BRAIN TUMOR-*MODERATE ADULT SEDATION*  COMPARISON: Outside MRI from 07/17/2009  .  TECHNIQUE: Multiplanar, multi-sequence MR imaging of the entire brain  was performed before and after intravenous administration of  gadolinium-based contrast.  .  FINDINGS:  .  Calvarium/skull base: No focal marrow replacing lesion suggestive  of neoplasm.  .  Orbits: Grossly unremarkable.  .  Paranasal sinuses: Moderate mucosal disease in the maxillary  sinuses bilaterally.  .   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 The lesion is of similar size compared to the  prior study from 07/17/2009 (and probably also similar in size to the  prior, very limited quality, study from 10/30/2008). The mass shows an  mildly heterogeneous, iso to hypointense signal on T2 weighted images  and fairly intense, homogeneous enhancement. Both the T2 signal and  enhancement appears somewhat different than on the prior study, but  this is perhaps most likely related to technical differences in  scanners and scanning techniques. The mass does appear to show some  susceptibility artifact, suggesting either calcium or previous  hemorrhage. No significant white matter disease or acute ischemia.  No evidence of hydrocephalus.  Grossly normal flow-related signal in  the major intracranial arteries and dural sinuses.  .  Additional Comments: Susceptibility within the lesion limits the  diagnostic utility of the gadolinium bolus perfusion imaging   18 mm enhancing right lateral ventricular mass which appears stable  in size compared to prior outside studies. The differential includes  central neurocytoma or intraventricular meningioma. Other less likely  etiologies include ependymoma, subependymoma (typically do not show  this degree of enhancement) or papilloma (atypical location in an  adult).    Past Surgical History:  Procedure Laterality Date   colonscopy     gamma knife radiation 2018 for brain tumor'     hematoma removed from arm Left    MASS EXCISION Left 06/30/2019   Procedure: EXCISION OF SOFT TISSUE  MASS LEFT SHOULDER;  Surgeon: Armandina Gemma, MD;  Location: [facility];  Service: General;  Laterality: Left;  LMA    Family Psychiatric History:  Alcoholism and drug abuse on father's side   Family History:  *Hypertension Mother   Hypertension Father    Social History:           Socioeconomic History   Marital status: Single      Spouse name: Not on file   Number of children: Not on  file   Years of education: Not on file   Highest education level: Not on file  Occupational History   Not on file  Tobacco Use   Smoking status: Former      Types: Cigarettes   Smokeless tobacco: Current   Tobacco comments:      quit jan 2020  Vaping Use   Vaping Use: Every day  Substance and Sexual Activity   Alcohol use: Not Currently      Comment: quit Oct 2019   Drug  use: Not Currently      Types: Cocaine, IV, Heroin, MDMA (Ecstacy)      Comment: last reported use; cocaine and heroin in 2019; MDMA Feb 2023   Sexual activity: Not Currently  Other Topics Concern   Not on file  Social History Narrative   Living with mother now    Social Determinants of Health     Difficulty of Paying Living Expenses: Disability  Food Insecurity:    Worried About Charity fundraiser in the Last Year: On disability   Arboriculturist in the Last Year:   Transportation Needs:    Film/video editor (Medical): Uses [employer] (Non-Medical):   Physical Activity:    Days of Exercise per Week:    Minutes of Exercise per Session:   Stress:    Feeling of Stress : Constant   Social Connections:    Frequency of Communication with Friends and Family: Mother recently kicked him out of house   Frequency of Social Gatherings with Friends and Family: Lives alone-   Attends Religious Services: No   Active Member of Clubs or Organizations: Not active with AA /NA   Attends Engineer, production    Allergies:  Allergies  Allergen Reactions   Ingrezza [Valbenazine Tosylate] Other (See Comments)    Severe tongue chewing at 80 mg Tolerates lower doses   Naloxone Palpitations    Pt report MAT Rx is Subutex/Buprenorphine only   Amphetamine-Dextroamphetamine Other (See Comments)    Metabolic Disorder Labs: Lab Results  Component Value Date   HGBA1C 5.0 10/03/2021   MPG 96.8 10/03/2021   No results found for: "PROLACTIN" Lab Results  Component Value Date   CHOL  191 10/03/2021   TRIG 92 10/03/2021   HDL 48 10/03/2021   CHOLHDL 4.0 10/03/2021   VLDL 18 10/03/2021   LDLCALC 125 (H) 10/03/2021   Lab Results  Component Value Date   TSH 1.500 02/04/2022   TSH 0.417 (L) 11/10/2021    Therapeutic Level Labs:NA  Current Medications: Current Outpatient Medications  Medication Sig Dispense Refill   buprenorphine (SUBUTEX) 8 MG SUBL SL tablet Place 24 mg under the tongue daily as needed.     buPROPion ER (WELLBUTRIN SR) 100 MG 12 hr tablet Take 1 tablet (100 mg total) by mouth 2 (two) times daily. 60 tablet 1   diphenhydrAMINE (BENADRYL) 50 MG tablet Take 2-4 tablets q6h as needed for restlessness related to Haldol 100 tablet 2   doxycycline (VIBRAMYCIN) 50 MG capsule Take 1 capsule (50 mg total) by mouth daily. 90 capsule 1   gabapentin (NEURONTIN) 400 MG capsule Take 1 capsule (400 mg total) by mouth 2 (two) times daily for 180 doses. 180 capsule 0   haloperidol (HALDOL) 1 MG tablet Take 1 tablet (1 mg total) by mouth daily. 90 tablet 0   hydrOXYzine (VISTARIL) 25 MG capsule Take 1 capsule (25 mg total) by mouth 3 (three) times daily as needed for anxiety. 90 capsule 2   modafinil (PROVIGIL) 200 MG tablet Take 3 tablets (600 mg total) by mouth daily. 90 tablet 2   polyethylene glycol (MIRALAX / GLYCOLAX) 17 g packet Take 17 g by mouth daily. 30 each 0   propranolol (INDERAL) 20 MG tablet Take 20 mg by mouth 3 (three) times daily.     tamsulosin (FLOMAX) 0.4 MG CAPS capsule Take by mouth.     thyroid (ARMOUR) 300 MG tablet Take 1/2 tablet daily 20  minutes before breakfast 15 tablet 2   No current facility-administered medications for this visit.     Musculoskeletal: Strength & Muscle Tone: Telepsych visit-Grossly normal Musculoskeletal and cranial nerve inspections Gait & Station: NA Patient leans: N/A  Psychiatric Specialty Exam: Review of Systems  Constitutional:  Positive for fatigue (Chronic). Negative for activity change, appetite  change, chills, diaphoresis, fever and unexpected weight change.  HENT: Negative.    Eyes: Negative.   Respiratory: Negative.    Cardiovascular: Negative.   Gastrointestinal: Negative.   Endocrine: Negative for cold intolerance, heat intolerance, polydipsia, polyphagia and polyuria.  Genitourinary: Negative.        BPH  Musculoskeletal: Negative.   Skin:  Positive for color change (Rosacea).  Allergic/Immunologic: Negative.   Neurological:  Positive for tremors (Familial). Negative for dizziness, seizures, syncope, facial asymmetry, speech difficulty, weakness, light-headedness, numbness and headaches.  Hematological: Negative.   Psychiatric/Behavioral:  Positive for sleep disturbance. Negative for agitation, behavioral problems, confusion, decreased concentration, hallucinations, self-injury and suicidal ideas. The patient is not nervous/anxious and is not hyperactive.     There were no vitals taken for this visit.There is no height or weight on file to calculate BMI.My Chart Visit  General Appearance: Casual  Eye Contact:  Good  Speech:  Clear and Coherent and Normal Rate  Volume:  Normal  Mood:  Euthymic  Affect:  Congruent  Thought Process:  Coherent, Goal Directed, and Descriptions of Associations: Intact  Orientation:  Full (Time, Place, and Person)  Thought Content: WDL and Logical   Suicidal Thoughts:  No  Homicidal Thoughts:  No  Memory:  Affected by his history of addiction /Stroke but currently intact    Judgement:  Other:  Improving  Insight:   Improving  Psychomotor Activity:  Negative  Concentration:  Concentration: Good and Attention Span: Good  Recall:  Good  Fund of Knowledge: WDL  Language: Good  Akathisia:  No  Handed:  Right  AIMS (if indicated): Grossly negative  Assets:  Desire for Improvement Financial Resources/Insurance Housing Resilience Social Support Talents/Skills Vocational/Educational  ADL's:  Intact  Cognition: Impaired,  Mild and  Moderate  Sleep:   Hypersomnia chronic   Screenings: [facility] Office Visit from 11/27/2021 in [facility] Visit from 11/06/2021 in [facility] Visit from 10[facility]23/2021 in [facility] Office Visit from 10/16/2021 in [facility] Admission (Discharged) from 10/02/2021 in [facility]  AIMS Total Score '4 7 5 21 '$ 0      Flowsheet Row Admission (Discharged) from 10/02/2021 in [facility] ED from 09/27/2021 in [facility] DEPT  C-SSRS RISK CATEGORY High Risk High Risk        Assessment Continued improvement except for sleep/fatigue issue   and Plan:  \Return to PCP ASAP to discuss ?another sleep study and ? Chronic Fatigue Syndrome diagnosis Get FU Thyroid panel ASAP  FU 2 weeks sooner if neded    [name], PA-C 03/26/2022, 2:32 PM

## 2022-04-16 ENCOUNTER — Encounter (HOSPITAL_COMMUNITY): Payer: Self-pay | Admitting: Medical

## 2022-04-16 ENCOUNTER — Telehealth (HOSPITAL_BASED_OUTPATIENT_CLINIC_OR_DEPARTMENT_OTHER): Payer: Medicare Other | Admitting: Medical

## 2022-04-16 DIAGNOSIS — I6930 Unspecified sequelae of cerebral infarction: Secondary | ICD-10-CM

## 2022-04-16 DIAGNOSIS — N401 Enlarged prostate with lower urinary tract symptoms: Secondary | ICD-10-CM

## 2022-04-16 DIAGNOSIS — G4710 Hypersomnia, unspecified: Secondary | ICD-10-CM

## 2022-04-16 DIAGNOSIS — L719 Rosacea, unspecified: Secondary | ICD-10-CM

## 2022-04-16 DIAGNOSIS — I6381 Other cerebral infarction due to occlusion or stenosis of small artery: Secondary | ICD-10-CM | POA: Diagnosis not present

## 2022-04-16 DIAGNOSIS — F1921 Other psychoactive substance dependence, in remission: Secondary | ICD-10-CM | POA: Diagnosis not present

## 2022-04-16 DIAGNOSIS — Z8659 Personal history of other mental and behavioral disorders: Secondary | ICD-10-CM

## 2022-04-16 DIAGNOSIS — G250 Essential tremor: Secondary | ICD-10-CM

## 2022-04-16 DIAGNOSIS — F1610 Hallucinogen abuse, uncomplicated: Secondary | ICD-10-CM

## 2022-04-16 DIAGNOSIS — I10 Essential (primary) hypertension: Secondary | ICD-10-CM

## 2022-04-16 DIAGNOSIS — Z79899 Other long term (current) drug therapy: Secondary | ICD-10-CM

## 2022-04-16 DIAGNOSIS — D320 Benign neoplasm of cerebral meninges: Secondary | ICD-10-CM

## 2022-04-16 DIAGNOSIS — F060 Psychotic disorder with hallucinations due to known physiological condition: Secondary | ICD-10-CM | POA: Diagnosis not present

## 2022-04-16 DIAGNOSIS — Z923 Personal history of irradiation: Secondary | ICD-10-CM

## 2022-04-16 DIAGNOSIS — D330 Benign neoplasm of brain, supratentorial: Secondary | ICD-10-CM

## 2022-04-16 DIAGNOSIS — E039 Hypothyroidism, unspecified: Secondary | ICD-10-CM

## 2022-04-16 MED ORDER — GABAPENTIN 400 MG PO CAPS: 400.0000 mg | ORAL_CAPSULE | Freq: Two times a day (BID) | ORAL | 0 refills | Status: DC

## 2022-04-16 MED ORDER — THYROID 300 MG PO TABS: ORAL_TABLET | ORAL | 2 refills | Status: DC

## 2022-04-16 MED ORDER — BUPROPION HCL ER (SR) 100 MG PO TB12: 100.0000 mg | ORAL_TABLET | Freq: Two times a day (BID) | ORAL | 1 refills | Status: DC

## 2022-04-16 NOTE — Progress Notes (Signed)
[place] MD/PA/NP OP Progress Note  04/16/2022 3:05 PM Paul Bray  MRN:  [id number] Virtual Visit via Video Note  I connected with Paul Bray on 04/16/22 at  2:00 PM EST by a video enabled telemedicine application and verified that I am speaking with the correct person using two identifiers.  Location: Patient: at home Provider: [place]   I discussed the limitations of evaluation and management by telemedicine and the availability of in person appointments. The patient expressed understanding and agreed to proceed.   History of Present Illness:See EPIC note    Observations/Objective:See EPIC note   Assessment and Plan:See EPIC note   Follow Up Instructions:See EPIC note   I discussed the assessment and treatment plan with the patient. The patient was provided an opportunity to ask questions and all were answered. The patient agreed with the plan and demonstrated an understanding of the instructions.   The patient was advised to call back or seek an in-person evaluation if the symptoms worsen or if the condition fails to improve as anticipated.  I provided 20 minutes of non-face-to-face time during this encounter.   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 Other than the fatigue he has made great progress since his Psychosis responded to Haldol which he continues to take at low dosage (1 mg). He had significant TDK at higher dosages.He depends on  high dose Modanifil for his Hypersomnia. 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 His goal is to eventually get  1 million subscribers. He has not gotten his Thyroid checked.He says pharmacist told him that some prescribers are adding synthroid to the Armour dose. He wants to get his Thyroid levels at top of normal range in hopes it will help the tiredness.His mother is back to driving after her foot surgery so he can finally get this done. He has not pursued any recovery support groups preferring to concentrate on his relationships with his You Tube family which keeps him from isolating he says.He continues to remain abstinent Living with his mother and having medications in dosage cards had definitely helped as well.  Visit Diagnosis:    ICD-10-CM   1. Polysubstance dependence in early, early partial, sustained full, or sustained partial remission ([place])  F19.21     2. Multiple lacunar infarcts (HCC)  I63.81     3. Sequelae, post-stroke  I69.30     4. 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 Persistent hypersomnia  G47.10     6. 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 Meningioma, cerebral ([place])  D32.0     8. Status post gamma knife treatment  Z92.3     9. Acquired hypothyroidism  E03.9     10. Familial tremor  G25.0     11. Rosacea  L71.9     12. Essential hypertension  I10     13. Medication management  Z79.899     14. History of ADHD  Z86.59     15. Benign prostatic  hyperplasia with lower urinary tract symptoms, symptom details unspecified  N40.1     16. 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 Dr Lynnda Shields C/O of paranoia he is aware of  but cant stop/completely control.Has rx for Risperdal from [facility]. Also hx of Gamma Knife tradiation fo benign brain tumor ?2018 Says has records Requesting Psychiatrist/medication mangement for paranoia hx [place] [facility] [place] 08/21/2021 - NOW [facility] INPATIENT  Date of Admission:  10/02/2021 Date of Discharge: 10/13/2021   [facility] Alcohol intoxication 03/26/2017  Alcohol withdrawal delirium 03/26/2017  Chronic daily headache 02/25/2017  Alcohol use disorder, severe, dependence 02/22/2017  Heroin dependence MAT Methadone [place]/Subutex TBR 01/2018  Anxiety disorder, unspecified 01/20/2017  Paranoia 03/2018    [facility] Unk Pinto, NP - 04/12/2017 12:38 PM EST Formatting of this note might be different from the original. [facility] Psychiatry - Substance Abuse PHP/IOP Intake Assessment  BH Formulate an individual and appropriate relapse prevention plan.   [place] SU Problem: Formation of a relapse prevention plan    [facility] Uncomplicated opioid dependence (CMS/HCC) 12/24/2017  1. Discharge from hospital. 2. Follow up at [facility]      Past Medical History:  Care Everywhere reviewed-No New Info   Past Medical History:  Diagnosis Date   ADHD    Anxiety    Brain tumor ([place])    balance and occ memory issues and headaches   Brain tumor ([place])    sees [facility] q year   Depression    Headache    migraine and tension   Hypothyroidism    Soft tissue mass    left shoulder   Substance abuse [facility])     OUTSIDE RECORDS     Neurosurgery ERNIE, KASLER [facility]# [id number] DOB: September 29, 1965 Visit Date: 05/01/2010 May 01, 2010  [facility] [address] [place] [address] Dear Doctors: I saw Paul Bray in the office today for a neurosurgical evaluation. I have enclosed a copy of the comprehensive report of that consultation.  In short, he is a 56 year old gentleman who is here for a second  opinion. He has had a known brain lesion that has been followed since July of 2010. His primary symptom seems to be hypersomnia. 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 The patient, however, has been convinced that a lesion that was seen on the MRI scan of his brain was responsible for his symptoms, and he is here for an additional opinion.  His neurologic exam is really entirely normal. He has a normal funduscopic exam, normal mental status. Cranial nerves are unremarkable with no real focal neurologic deficits. I reviewed a series of MRI scans on him dating from July of 2010 to current. He has an oval, smooth, rounded lesion within the right lateral ventricle, in the posterior ports in the lateral ventricle that seems to be on the medial side of the choroid. It is slightly  less than 2 cm in greatest diameter. It, since July of 2010 until now, has not changed in size.  I have explained to the patient that I do not really think that this lesion necessarily is related to his symptoms. Even if it were, I do not think that trying to address this lesion from a surgical standpoint would necessarily result in improvement of his symptoms and certainly would carry a significant degree of potential morbidity, given its location and its proximity to the internal cerebral vein. I have recommended just continued observation. I have told him I would be happy to see him back in a year's time with a followup MRI scan.  I will keep in touch with you when I do see him back. In the meantime best wishes and thanks for allowing me to participate in his care.  Sincerely,  John A. Redmond Pulling, M.D., F.A.C.S. Attending Physician Fax: [phone]  MRI [place], Apr 25, 2010 03:36:00  PM  .  INDICATION:  BRAIN TUMOR                 [id number] IDX VISIT #  [id number] BRAIN TUMOR-*MODERATE ADULT SEDATION*  COMPARISON: Outside MRI from 07/17/2009  .  TECHNIQUE: Multiplanar, multi-sequence MR imaging of the entire brain  was performed before and after intravenous administration of  gadolinium-based contrast.  .  FINDINGS:  .  Calvarium/skull base: No focal marrow replacing lesion suggestive  of neoplasm.  .  Orbits: Grossly unremarkable.  .  Paranasal sinuses: Moderate mucosal disease in the maxillary  sinuses bilaterally.  .   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 The lesion is of similar size compared to the  prior study from 07/17/2009 (and probably also similar in size to the  prior, very limited quality, study from 10/30/2008). The mass shows an  mildly heterogeneous, iso to hypointense signal on T2 weighted images  and fairly intense, homogeneous enhancement. Both the T2 signal and  enhancement appears somewhat different than on the prior study, but  this is perhaps most likely related to technical differences in  scanners and scanning techniques. The mass does appear to show some  susceptibility artifact, suggesting either calcium or previous  hemorrhage. No significant white matter disease or acute ischemia.  No evidence of hydrocephalus.  Grossly normal flow-related signal in  the major intracranial arteries and dural sinuses.  .  Additional Comments: Susceptibility within the lesion limits the  diagnostic utility of the gadolinium bolus perfusion imaging    18 mm enhancing right lateral ventricular mass which appears stable  in size compared to prior outside studies. The differential includes  central neurocytoma or intraventricular meningioma. Other less likely  etiologies include ependymoma, subependymoma (typically do not show  this degree of  enhancement) or papilloma (atypical location in an  adult).    Past Surgical History:  Procedure Laterality Date   colonscopy     gamma knife radiation 2018 for brain tumor'     hematoma removed from arm Left    MASS EXCISION Left 06/30/2019   Procedure: EXCISION OF SOFT TISSUE  MASS LEFT SHOULDER;  Surgeon: Armandina Gemma, MD;  Location: [place];  Service: General;  Laterality: Left;  LMA    Family Psychiatric History:  Alcoholism and drug abuse on father's side   Family History:  *Hypertension Mother   Hypertension Father  Social History:  Social History   Socioeconomic History   Marital status: Single    Spouse name: Not on file   Number of children: NA   Years of education: Not on file   Highest education level: Not on file  Occupational History   Computer Programmer until 2016  Tobacco Use   Smoking status: Former    Types: Cigarettes   Smokeless tobacco: Current Vape   Tobacco comments:    quit cigarettes jan 2020  Vaping Use   Vaping Use: Every day  Substance and Sexual Activity   Alcohol use: Not Currently    Comment: quit Oct 2019   Drug use: Not Currently    Types: Cocaine, IV, Heroin, MDMA (Ecstacy)    Comment: last reported use; cocaine and heroin in 2019; MDMA Feb 2023   Sexual activity: Not Currently  Other Topics Concern   Not on file  Social History Narrative    Living with mother now     Social Determinants of Health    Difficulty of Paying Living Expenses: Disability  Food Insecurity:    Worried About Charity fundraiser in the Last Year: On disability   Arboriculturist in the Last Year:   Transportation Needs:    Film/video editor (Medical): Uses Ecologist (Non-Medical):   Physical Activity:    Days of Exercise per Week:    Minutes of Exercise per Session:   Stress:    Feeling of Stress : Constant   Social Connections:    Frequency of Communication with Friends and Family: Mother  recently kicked him out of house   Frequency of Social Gatherings with Friends and Family: Lives alone-   Attends Religious Services: No   Active Member of Clubs or Organizations: Not active with AA /NA   Attends Club or Organizati     Allergies:  Allergies  Allergen Reactions   Ingrezza [Valbenazine Tosylate] Other (See Comments)    Severe tongue chewing at 80 mg Tolerates lower doses   Naloxone Palpitations    Pt report MAT Rx is Subutex/Buprenorphine only   Amphetamine-Dextroamphetamine Other (See Comments)    Metabolic Disorder Labs: Lab Results  Component Value Date   HGBA1C 5.0 10/03/2021   MPG 96.8 10/03/2021   No results found for: "PROLACTIN" Lab Results  Component Value Date   CHOL 191 10/03/2021   TRIG 92 10/03/2021   HDL 48 10/03/2021   CHOLHDL 4.0 10/03/2021   VLDL 18 10/03/2021   LDLCALC 125 (H) 10/03/2021   Lab Results  Component Value Date   TSH 1.500 02/04/2022   TSH 0.417 (L) 11/10/2021  Awaiting repeat  Therapeutic Level Labs:NA  Current Medications: Current Outpatient Medications  Medication Sig Dispense Refill   buprenorphine (SUBUTEX) 8 MG SUBL SL tablet Place 24 mg under the tongue daily as needed.     buPROPion ER (WELLBUTRIN SR) 100 MG 12 hr tablet Take 1 tablet (100 mg total) by mouth 2 (two) times daily. 60 tablet 1   diphenhydrAMINE (BENADRYL) 50 MG tablet Take 2-4 tablets q6h as needed for restlessness related to Haldol 100 tablet 2   doxycycline (VIBRAMYCIN) 50 MG capsule Take 1 capsule (50 mg total) by mouth daily. 90 capsule 1   gabapentin (NEURONTIN) 400 MG capsule Take 1 capsule (400 mg total) by mouth 2 (two) times daily for 180 doses. 180 capsule 0   haloperidol (HALDOL) 1 MG tablet Take 1 tablet (1 mg total) by mouth  daily. 90 tablet 0   hydrOXYzine (VISTARIL) 25 MG capsule Take 1 capsule (25 mg total) by mouth 3 (three) times daily as needed for anxiety. 90 capsule 2   modafinil (PROVIGIL) 200 MG tablet Take 3 tablets (600 mg  total) by mouth daily. 90 tablet 2   polyethylene glycol (MIRALAX / GLYCOLAX) 17 g packet Take 17 g by mouth daily. 30 each 0   propranolol (INDERAL) 20 MG tablet Take 20 mg by mouth 3 (three) times daily.     tamsulosin (FLOMAX) 0.4 MG CAPS capsule Take by mouth.     thyroid (ARMOUR) 300 MG tablet Take 1/2 tablet daily 20 minutes before breakfast 15 tablet 2   No current facility-administered medications for this visit.     Musculoskeletal: Strength & Muscle Tone: Telepsych visit-Grossly normal Musculoskeletal and cranial nerve inspections Gait & Station: NA Patient leans: N/A Psychiatric Specialty Exam: Review of Systems  Constitutional:  Positive for activity change (Continues to prgram You Tube) and fatigue (Chronic). Negative for appetite change, chills, diaphoresis, fever and unexpected weight change.  HENT:  Negative for congestion, dental problem, ear discharge, ear pain, nosebleeds, postnasal drip, rhinorrhea, sinus pressure, sinus pain, sneezing, sore throat, tinnitus, trouble swallowing and voice change.   Eyes: Negative.   Respiratory: Negative.    Cardiovascular: Negative.   Gastrointestinal: Negative.   Endocrine: Negative for cold intolerance, heat intolerance, polydipsia, polyphagia and polyuria.  Genitourinary:  Positive for difficulty urinating. Negative for decreased urine volume, dysuria, enuresis, flank pain, frequency, genital sores, hematuria, penile discharge, penile pain, penile swelling, scrotal swelling, testicular pain and urgency.       BPH on Rx meds  Musculoskeletal: Negative.   Skin:  Positive for color change (Rosacea). Negative for pallor, rash and wound.  Neurological:  Positive for tremors (Familial). Negative for dizziness, seizures, syncope, facial asymmetry, speech difficulty, weakness, light-headedness, numbness and headaches.  Hematological:  Negative for adenopathy. Does not bruise/bleed easily.  Psychiatric/Behavioral:  Positive for sleep  disturbance (Chronic). Negative for agitation, behavioral problems, confusion, decreased concentration, dysphoric mood, hallucinations, self-injury and suicidal ideas. The patient is not nervous/anxious and is not hyperactive.     There were no vitals taken for this visit.There is no height or weight on file to calculate BMI.My Chart visit  General Appearance: Casual  Eye Contact:  Good  Speech:  Clear and Coherent and Normal Rate  Volume:  Normal  Mood:  Dysphoric and Euthymic mixture  Affect:  Appropriate and Congruent  Thought Process:  Coherent, Goal Directed, and Descriptions of Associations: Intact  Orientation:  Full (Time, Place, and Person)  Thought Content: WDL and Logical   Suicidal Thoughts:  No  Homicidal Thoughts:  No  Memory:   Affected by his history of addiction /Stroke but currently intact   Judgement:  Other:  Improved  Insight:   Limited  Psychomotor Activity:  Normal  Concentration:  Concentration: Good and Attention Span: Good  Recall:   See memory  Fund of Knowledge:  WDL  Language: Good  Akathisia:  NA  Handed:  Right  AIMS (if indicated): Grossly normal since decreasing Haldol  Assets:  Desire for Improvement Financial Resources/Insurance Housing Resilience Social Support Talents/Skills Transportation Vocational/Educational  ADL's:  Intact  Cognition:Impaired,  Mild and Moderate per memory/He is functional  Sleep:   Hypersomniac   Screenings: [place] Office Visit from 11/27/2021 in [facility] Visit from 11/06/2021 in [place] Visit from  10/23/2021 in [facility] Visit from 10/16/2021 in [facility] Admission (Discharged) from 10/02/2021 in [place] [facility]  AIMS Total Score '4 7 5 21 '$ 0      Flowsheet Row Admission (Discharged) from  10/02/2021 in [place] [facility] ED from 09/27/2021 in [place] DEPT  C-SSRS RISK CATEGORY High Risk High Risk        Assessment : Continues to improve    and Plan: Continue current plan FU 2 weeks  Collaboratio [name], PA-C 04/16/2022, 3:05 PM

## 2022-04-30 ENCOUNTER — Encounter (HOSPITAL_COMMUNITY): Payer: Self-pay | Admitting: Medical

## 2022-04-30 ENCOUNTER — Telehealth (HOSPITAL_COMMUNITY): Payer: Medicare Other | Admitting: Medical

## 2022-04-30 DIAGNOSIS — F1610 Hallucinogen abuse, uncomplicated: Secondary | ICD-10-CM

## 2022-04-30 DIAGNOSIS — D330 Benign neoplasm of brain, supratentorial: Secondary | ICD-10-CM

## 2022-04-30 DIAGNOSIS — F1921 Other psychoactive substance dependence, in remission: Secondary | ICD-10-CM

## 2022-04-30 DIAGNOSIS — Z923 Personal history of irradiation: Secondary | ICD-10-CM

## 2022-04-30 DIAGNOSIS — Z79899 Other long term (current) drug therapy: Secondary | ICD-10-CM

## 2022-04-30 DIAGNOSIS — Z8659 Personal history of other mental and behavioral disorders: Secondary | ICD-10-CM

## 2022-04-30 DIAGNOSIS — F060 Psychotic disorder with hallucinations due to known physiological condition: Secondary | ICD-10-CM

## 2022-04-30 DIAGNOSIS — F1421 Cocaine dependence, in remission: Secondary | ICD-10-CM

## 2022-04-30 DIAGNOSIS — R5382 Chronic fatigue, unspecified: Secondary | ICD-10-CM

## 2022-04-30 DIAGNOSIS — N401 Enlarged prostate with lower urinary tract symptoms: Secondary | ICD-10-CM

## 2022-04-30 DIAGNOSIS — F0634 Mood disorder due to known physiological condition with mixed features: Secondary | ICD-10-CM

## 2022-04-30 DIAGNOSIS — F1321 Sedative, hypnotic or anxiolytic dependence, in remission: Secondary | ICD-10-CM

## 2022-04-30 DIAGNOSIS — G4710 Hypersomnia, unspecified: Secondary | ICD-10-CM

## 2022-04-30 DIAGNOSIS — I6930 Unspecified sequelae of cerebral infarction: Secondary | ICD-10-CM

## 2022-04-30 DIAGNOSIS — G250 Essential tremor: Secondary | ICD-10-CM

## 2022-04-30 DIAGNOSIS — I10 Essential (primary) hypertension: Secondary | ICD-10-CM

## 2022-04-30 DIAGNOSIS — D320 Benign neoplasm of cerebral meninges: Secondary | ICD-10-CM

## 2022-04-30 DIAGNOSIS — L719 Rosacea, unspecified: Secondary | ICD-10-CM

## 2022-04-30 DIAGNOSIS — I6381 Other cerebral infarction due to occlusion or stenosis of small artery: Secondary | ICD-10-CM

## 2022-04-30 DIAGNOSIS — E039 Hypothyroidism, unspecified: Secondary | ICD-10-CM

## 2022-04-30 DIAGNOSIS — F1021 Alcohol dependence, in remission: Secondary | ICD-10-CM

## 2022-04-30 NOTE — Progress Notes (Signed)
[place] MD/PA/NP OP Progress Note  04/30/2022 5:12 PM [address]  MRN:  [id number] Virtual Visit via Video Note  I connected with Paul Bray on 04/30/22 at  3:30 PM EST by a video enabled telemedicine application and verified that I am speaking with the correct person using two identifiers.  Location: Patient: Home Provider: [place]   I discussed the limitations of evaluation and management by telemedicine and the availability of in person appointments. The patient expressed understanding and agreed to proceed.   History of Present Illness:See EPIC note    Observations/Objective:See EPIC note   Assessment and Plan:See EPIC note   Follow Up Instructions:See EPIC note  I discussed the assessment and treatment plan with the patient. The patient was provided an opportunity to ask questions and all were answered. The patient agreed with the plan and demonstrated an understanding of the instructions.   The patient was advised to call back or seek an in-person evaluation if the symptoms worsen or if the condition fails to improve as anticipated.  I provided 20 minutes of non-face-to-face time during this encounter.   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 He believed the tumor was the cause of this and subsequent development of auditory hallucinations which at times progressed to paranoid psychosis. During this time period he had  developed an IV cocaine dependence and serial FU MRIs revealed a series of mini strokes in the Lt basal ganglia area which coincide with his deteriration and can explain the development of his psychosis.Sent to live by himself in public housing without direct support and supervision he began to take medications by how he felt until, finally, he became suicidal after using Ectasy and overmedicating himself. His mother rescued him and brought him to [facility] from which he was admitted to [facility] and treated. He failed second generation antipsychotics so that he was evemtually put on high dose Haldol. His psychosis respondedbut he had significant EPS symptomsAttempts to contol them wuth new TDK meds resulted in severe side effects from those medications but fortunately were controlled with Benadryl while tapering his Haldol to lowest effective dosage which is now at 1 mg without side effect.He was discharged and was moved in with his mother. 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 He continues to c/o severe fatigue now. Prior to this he c/o hypersomnia rather than fatigue.He is still needing to get another Thyroid study but is within lo normal range by lab.  Visit Diagnosis:    ICD-10-CM   1. Polysubstance dependence in early, early partial, sustained full, or sustained partial remission ([place])  F19.21     2. Multiple lacunar infarcts (HCC)  I63.81     3. Sequelae, post-stroke  I69.30     4. Persistent hypersomnia  G47.10     5. 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 Benign neoplasm of supratentorial region of brain ([place])  D33.0     7. Meningioma, cerebral ([place])  D32.0     8. Status post gamma knife treatment  Z92.3     9. Acquired hypothyroidism  E03.9     10. Familial tremor  G25.0     11. Rosacea  L71.9     12. Essential hypertension   I10     13. Medication management  Z79.899     14. History of ADHD  Z86.59     15. Benign prostatic hyperplasia with lower urinary tract symptoms, symptom details unspecified  N40.1     16. 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 Chronic fatigue, unspecified  R53.82     18. Alcohol use disorder, severe, in sustained remission (HCC)  F10.21     19. Sedative, hypnotic or anxiolytic use disorder, moderate, in sustained remission (HCC)  F13.21     20. Cocaine dependence in remission (HCC)  F14.21     21. Hypothyroidism, unspecified type  E03.9     22. Bipolar and related disorder due to another medical condition with mixed features  F06.34       Past Psychiatric History: Care Everywhere reviewed-No New Info 03/26/2022 but old records from [facility] are now present and copied below for his record [place] Date of recent admission: 10/29/21 Date of recent discharge: 11/01/21 [name] [place] 6/17/-03/04/2020  Pt with Opiate dependence MAT Suboxone at [facility] Dr Lynnda Shields C/O of paranoia he is aware of but cant stop/completely control.Has rx for Risperdal from [facility]. Also hx of Gamma Knife tradiation fo benign brain tumor ?2018 Says has records Requesting Psychiatrist/medication mangement for paranoia hx [place] [facility] [place] 08/21/2021 - NOW [facility] INPATIENT  Date of Admission:  10/02/2021 Date of Discharge: 10/13/2021   [facility] Alcohol intoxication 03/26/2017  Alcohol withdrawal delirium 03/26/2017  Chronic daily headache 02/25/2017  Alcohol use disorder, severe, dependence 02/22/2017  Heroin dependence MAT Methadone [place]/Subutex TBR 01/2018  Anxiety disorder, unspecified 01/20/2017  Paranoia 03/2018    [facility] Unk Pinto, NP - 04/12/2017 12:38 PM EST Formatting of this note might be different from the original. [facility] Psychiatry - Substance Abuse PHP/IOP Intake Assessment  BH Formulate an  individual and appropriate relapse prevention plan.   [place] SU Problem: Formation of a relapse prevention plan    [facility] Uncomplicated opioid dependence (CMS/HCC) 12/24/2017  1. Discharge from hospital. 2. Follow up [facility]    Past Medical History:  Care Everywhere reviewed-No New Info   Past Medical History:  Diagnosis Date   ADHD    Anxiety    Brain tumor ([place])    balance and occ memory issues and headaches   Brain tumor ([place])    sees [facility] q year   Depression    Headache    migraine and tension   Hypothyroidism    Soft tissue mass    left shoulder   Substance abuse [facility])    OUTSIDE RECORDS     Neurosurgery DAYLN, TUGWELL [facility]# [id number] DOB: [birth year] Visit Date: 05/01/2010 May 01, 2010  [facility] [address], [address] [place] [address] Dear Doctors: I saw Mr. Glasscock in the office today for a neurosurgical evaluation. I have enclosed a copy of the comprehensive report of that consultation.  In short,  he is a 57 year old gentleman who is here for a second opinion. He has had a known brain lesion that has been followed since July of 2010. His primary symptom seems to be hypersomnia. 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 The patient, however, has been convinced that a lesion that was seen on the MRI scan of his brain was responsible for his symptoms, and he is here for an additional opinion.  His neurologic exam is really entirely normal. He has a normal funduscopic exam, normal mental status. Cranial nerves are unremarkable with no real focal neurologic deficits. I reviewed a series of MRI scans on him dating from July of 2010 to current. He has an oval, smooth, rounded  lesion within the right lateral ventricle, in the posterior ports in the lateral ventricle that seems to be on the medial side of the choroid. It is slightly less than 2 cm in greatest diameter. It, since July of 2010 until now, has not changed in size.  I have explained to the patient that I do not really think that this lesion necessarily is related to his symptoms. Even if it were, I do not think that trying to address this lesion from a surgical standpoint would necessarily result in improvement of his symptoms and certainly would carry a significant degree of potential morbidity, given its location and its proximity to the internal cerebral vein. I have recommended just continued observation. I have told him I would be happy to see him back in a year's time with a followup MRI scan.  I will keep in touch with you when I do see him back. In the meantime best wishes and thanks for allowing me to participate in his care.  Sincerely,  John A. Redmond Pulling, M.D., F.A.C.S. Attending Physician Fax: [phone]  MRI [place], Apr 25, 2010 03:36:00 PM  .  INDICATION:  BRAIN TUMOR                 [id number] IDX VISIT #  [id number] BRAIN TUMOR-*MODERATE ADULT SEDATION*  COMPARISON: Outside MRI from 07/17/2009  .  TECHNIQUE: Multiplanar, multi-sequence MR imaging of the entire brain  was performed before and after intravenous administration of  gadolinium-based contrast.  .  FINDINGS:  .  Calvarium/skull base: No focal marrow replacing lesion suggestive  of neoplasm.  .  Orbits: Grossly unremarkable.  .  Paranasal sinuses: Moderate mucosal disease in the maxillary  sinuses bilaterally.  .   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 The lesion is of similar size compared to the  prior study from 07/17/2009 (and probably also similar in size to the  prior,  very limited quality, study from 10/30/2008). The mass shows an  mildly heterogeneous, iso to hypointense signal on T2 weighted images  and fairly intense, homogeneous enhancement. Both the T2 signal and  enhancement appears somewhat different than on the prior study, but  this is perhaps most likely related to technical differences in  scanners and scanning techniques. The mass does appear to show some  susceptibility artifact, suggesting either calcium or previous  hemorrhage. No significant white matter disease or acute ischemia.  No evidence of hydrocephalus.  Grossly normal flow-related signal in  the major intracranial arteries and dural sinuses.  .  Additional Comments: Susceptibility within the lesion limits the  diagnostic utility of the gadolinium bolus perfusion imaging    18 mm enhancing right lateral ventricular mass which appears stable  in size compared to prior outside studies. The differential includes  central neurocytoma or intraventricular meningioma. Other less likely  etiologies include ependymoma, subependymoma (typically do not show  this degree of enhancement) or papilloma (atypical location in an  adult).   Past Surgical History:  Procedure Laterality Date   colonscopy     gamma knife radiation 2018 for brain tumor'     hematoma removed from arm Left    MASS EXCISION Left 06/30/2019   Procedure: EXCISION OF SOFT TISSUE  MASS LEFT SHOULDER;  Surgeon: Armandina Gemma, MD;  Location: [place];  Service: General;  Laterality: Left;  LMA    Family Psychiatric History:  Alcoholism and drug abuse on father's side   Family History: No family history on file.   Hypertension Mother   Hypertension/Alcoholism/Addiction Father   Social History:  Socioeconomic History   Marital status: Single      Spouse name: Not on file   Number of children: NA   Years of education: Not on file   Highest education level: Not on file  Occupational History   Computer  Programmer until 2016  Tobacco Use   Smoking status: Former      Types: Cigarettes   Smokeless tobacco: Current Vape   Tobacco comments:      quit cigarettes jan 2020  Vaping Use   Vaping Use: Every day  Substance and Sexual Activity   Alcohol use: Not Currently      Comment: quit Oct 2019   Drug use: Not Currently      Types: Cocaine, IV, Heroin, MDMA (Ecstacy)      Comment: last reported use; cocaine and heroin in 2019; MDMA Feb 2023   Sexual activity: Not Currently  Other Topics Concern   Not on file  Social History Narrative    Living with mother now      Social Determinants of Health     Difficulty of Paying Living Expenses: Disability  Food Insecurity:    Worried About Charity fundraiser in the Last Year: On disability   Arboriculturist in the Last Year:   Transportation Needs:    Film/video editor (Medical): Uses Ecologist (Non-Medical):   Physical Activity:    Days of Exercise per Week:    Minutes of Exercise per Session:   Stress:    Feeling of Stress : Constant   Social Connections:    Frequency of Communication with Friends and Family: Mother recently kicked him out of house   Frequency of Social Gatherings with Friends and Family: Lives alone-   Attends Religious Services: No   Active Member of Clubs or Organizations: Not active with AA /NA   Attends Club or Organizati      Allergies:  Allergies  Allergen Reactions   Ingrezza [Valbenazine Tosylate] Other (See Comments)    Severe tongue chewing at 80 mg Tolerates lower doses   Naloxone Palpitations    Pt report MAT Rx is Subutex/Buprenorphine only   Amphetamine-Dextroamphetamine Other (See Comments)    Metabolic Disorder Labs: Lab Results  Component Value Date   HGBA1C 5.0 10/03/2021   MPG 96.8 10/03/2021   No results found for: "  PROLACTIN" Lab Results  Component Value Date   CHOL 191 10/03/2021   TRIG 92 10/03/2021   HDL 48 10/03/2021   CHOLHDL 4.0  10/03/2021   VLDL 18 10/03/2021   LDLCALC 125 (H) 10/03/2021   Lab Results  Component Value Date   TSH 1.500 02/04/2022   TSH 0.417 (L) 11/10/2021    Therapeutic Level Labs: No results found for: "LITHIUM" No results found for: "VALPROATE" No results found for: "CBMZ"  Current Medications: Current Outpatient Medications  Medication Sig Dispense Refill   buprenorphine (SUBUTEX) 8 MG SUBL SL tablet Place 24 mg under the tongue daily as needed.     buPROPion ER (WELLBUTRIN SR) 100 MG 12 hr tablet Take 1 tablet (100 mg total) by mouth 2 (two) times daily. 60 tablet 1   diphenhydrAMINE (BENADRYL) 50 MG tablet Take 2-4 tablets q6h as needed for restlessness related to Haldol 100 tablet 2   doxycycline (VIBRAMYCIN) 50 MG capsule Take 1 capsule (50 mg total) by mouth daily. 90 capsule 1   gabapentin (NEURONTIN) 400 MG capsule Take 1 capsule (400 mg total) by mouth 2 (two) times daily for 180 doses. 180 capsule 0   haloperidol (HALDOL) 1 MG tablet Take 1 tablet (1 mg total) by mouth daily. 90 tablet 0   hydrOXYzine (VISTARIL) 25 MG capsule Take 1 capsule (25 mg total) by mouth 3 (three) times daily as needed for anxiety. 90 capsule 2   modafinil (PROVIGIL) 200 MG tablet Take 3 tablets (600 mg total) by mouth daily. 90 tablet 2   polyethylene glycol (MIRALAX / GLYCOLAX) 17 g packet Take 17 g by mouth daily. 30 each 0   propranolol (INDERAL) 20 MG tablet Take 20 mg by mouth 3 (three) times daily.     tamsulosin (FLOMAX) 0.4 MG CAPS capsule Take by mouth.     thyroid (ARMOUR) 300 MG tablet Take 1/2 tablet daily 20 minutes before breakfast 15 tablet 2   No current facility-administered medications for this visit.   Musculoskeletal: Strength & Muscle Tone: Telepsych visit-Grossly normal Musculoskeletal and cranial nerve inspections Gait & Station: NA Patient leans: N/A  Psychiatric Specialty Exam: Review of Systems  Constitutional:  Positive for fatigue (Chronic ?CFS). Negative for activity  change, appetite change, chills, diaphoresis, fever and unexpected weight change.  HENT: Negative.    Eyes: Negative.   Respiratory: Negative.         Vapes  Cardiovascular: Negative.   Gastrointestinal: Negative.   Endocrine:       Hypothyroid  Genitourinary:        BPH on meds  Musculoskeletal: Negative.   Skin:  Positive for color change.       Rosacea  Allergic/Immunologic: Negative.   Neurological:  Positive for tremors (Familial). Negative for dizziness, seizures, syncope, facial asymmetry, speech difficulty, weakness, light-headedness, numbness and headaches.  Hematological:  Negative for adenopathy. Does not bruise/bleed easily.  Psychiatric/Behavioral:  Positive for sleep disturbance. Negative for agitation, behavioral problems, confusion, decreased concentration, dysphoric mood, hallucinations, self-injury and suicidal ideas. The patient is not nervous/anxious and is not hyperactive.     There were no vitals taken for this visit.There is no height or weight on file to calculate BMI.MY CHART Visit  General Appearance: Casual and Well Groomed  Eye Contact:  Good  Speech:  Clear and Coherent and Normal Rate  Volume:  Normal  Mood:  Euthymic  Affect:  Appropriate and Congruent  Thought Process:  Coherent, Goal Directed, and Descriptions of Associations: Intact  Orientation:  Full (Time, Place, and Person)  Thought Content: WDL and Logical   Suicidal Thoughts:  No  Homicidal Thoughts:  No  Memory:   Affected by history of drug addiction and stroke  Judgement:  Other:  Much improved  Insight:   Improving also  Psychomotor Activity:   Normal for visit  Concentration:  Concentration: Good and Attention Span: Good  Recall:   See memory Recent [place] of Knowledge:  WDL  Language: Good  Akathisia:  Negative  Handed:  Right  AIMS (if indicated): Negative  Assets:  Desire for Improvement Financial Resources/Insurance Housing Resilience Social  Support Talents/Skills Vocational/Educational  ADL's:  Intact  Cognition: Impaired,  Mild  Sleep:   Chronic tiredness   Screenings: [place] Office Visit from 11/27/2021 in [facility] Visit from 11/06/2021 in [facility] Office Visit from 10/23/2021 in [facility] Office Visit from 10/16/2021 in [facility] Admission (Discharged) from 10/02/2021 in [facility]  AIMS Total Score '4 7 5 21 '$ 0      Flowsheet Row Admission (Discharged) from 10/02/2021 in [facility] ED from 09/27/2021 in [place] DEPT  C-SSRS RISK CATEGORY High Risk High Risk        Assessment  Continued remarkable progress. ?Chronic Fatigue Syndrome     and Plan: Continue current regimin.FU 2 weeks Sooner if needed Explore CFS evaluation    Paul Russian, PA-C 04/30/2022, 5:12 PM

## 2022-05-14 ENCOUNTER — Telehealth (HOSPITAL_BASED_OUTPATIENT_CLINIC_OR_DEPARTMENT_OTHER): Payer: Medicare Other | Admitting: Medical

## 2022-05-14 DIAGNOSIS — L719 Rosacea, unspecified: Secondary | ICD-10-CM

## 2022-05-14 DIAGNOSIS — Z79899 Other long term (current) drug therapy: Secondary | ICD-10-CM

## 2022-05-14 DIAGNOSIS — F1921 Other psychoactive substance dependence, in remission: Secondary | ICD-10-CM | POA: Diagnosis not present

## 2022-05-14 DIAGNOSIS — E039 Hypothyroidism, unspecified: Secondary | ICD-10-CM

## 2022-05-14 DIAGNOSIS — G250 Essential tremor: Secondary | ICD-10-CM

## 2022-05-14 DIAGNOSIS — G4710 Hypersomnia, unspecified: Secondary | ICD-10-CM | POA: Diagnosis not present

## 2022-05-14 DIAGNOSIS — D320 Benign neoplasm of cerebral meninges: Secondary | ICD-10-CM

## 2022-05-14 DIAGNOSIS — I10 Essential (primary) hypertension: Secondary | ICD-10-CM

## 2022-05-14 DIAGNOSIS — Z923 Personal history of irradiation: Secondary | ICD-10-CM

## 2022-05-14 DIAGNOSIS — R5382 Chronic fatigue, unspecified: Secondary | ICD-10-CM

## 2022-05-14 DIAGNOSIS — F060 Psychotic disorder with hallucinations due to known physiological condition: Secondary | ICD-10-CM

## 2022-05-14 DIAGNOSIS — F1610 Hallucinogen abuse, uncomplicated: Secondary | ICD-10-CM

## 2022-05-14 DIAGNOSIS — I6381 Other cerebral infarction due to occlusion or stenosis of small artery: Secondary | ICD-10-CM | POA: Diagnosis not present

## 2022-05-14 DIAGNOSIS — D330 Benign neoplasm of brain, supratentorial: Secondary | ICD-10-CM

## 2022-05-14 DIAGNOSIS — N401 Enlarged prostate with lower urinary tract symptoms: Secondary | ICD-10-CM

## 2022-05-14 DIAGNOSIS — Z8659 Personal history of other mental and behavioral disorders: Secondary | ICD-10-CM

## 2022-05-14 DIAGNOSIS — I6930 Unspecified sequelae of cerebral infarction: Secondary | ICD-10-CM

## 2022-05-14 NOTE — Progress Notes (Signed)
[place] MD/PA/NP OP Progress Note  05/27/2022 5:56 PM Paul Bray  MRN:  [id number] Virtual Visit via Video Note  I connected with Paul Bray on 05/27/22 at  3:00 PM EST by a video enabled telemedicine application and verified that I am speaking with the correct person using two identifiers.  Location: Patient: At home Provider: [place]   I discussed the limitations of evaluation and management by telemedicine and the availability of in person appointments. The patient expressed understanding and agreed to proceed.   History of Present Illness:See EPIC note    Observations/Objective:See EPIC note   Assessment and Plan:See EPIC note   Follow Up Instructions:See EPIC note   I discussed the assessment and treatment plan with the patient. The patient was provided an opportunity to ask questions and all were answered. The patient agreed with the plan and demonstrated an understanding of the instructions.   The patient was advised to call back or seek an in-person evaluation if the symptoms worsen or if the condition fails to improve as anticipated.  I provided 20 minutes of non-face-to-face time during this encounter.   Paul Russian, PA-C   Chief Complaint: No chief complaint on file.  HPI: Paul Bray returns for 2 week FU and continues to report abscence of hallucinations and persistent chronic fatigue /dx of hypersomnia. He had occult lt basilar infarcts sometime in 2019 or 2020 that went undetected clinically until MRI 10/29/2018 Remote lacunar infarcts in the left basal ganglia. No evidence of acute abnormality. No significant white matter disease. No evidence of acute infarct. No mass effect, acute hemorrhage, or hydrocephalus. Grossly normal flow-related signal in the major intracranial arteries and dural sinuses.  The strokes most likely occurred during a time when he had history of IV Cocaine and or Fentanyl use in 2019.. It appears that it was around this time period that  he began to display paranoid thoughts and behaviors.His Physician in his Suboxone program began to treat him with antipsychotics and as well as treating other complaints ,including the hypersomnia.  The patient had been focused on his "brain tumor" as the explanation for his deterioration.He never responded to the medication (Risperdal) His provider died from an overdose and he switched Suboxone practices and aske to come here. He eventually ended up in public housing and left to his own devices abused hias prescription medications until he decided to take some Ectasy and became severely paranoid with suicidal ideations.The Ectasy use resulted in Hospitalization that eventually led to his being released to live with his mother to help manage his medication use which at best was erratic when living alone. During this hospitalization he failed to respond to another Atypical Zyprexa but amazingly had a complete response to [name]. He had severe TDK at higher doses but was able to tolerate a taper to '1mg'$  QD where he stands today. He continues to pursue his interest in You Tube site and is working on developing videoa that would attract a high level of interestand return visitors.    Visit Diagnosis:    ICD-10-CM   1. Polysubstance dependence in early, early partial, sustained full, or sustained partial remission ([name])  F19.21     2. Multiple lacunar infarcts (HCC)  I63.81     3. Sequelae, post-stroke  I69.30     4. Persistent hypersomnia  G47.10    ?CFS    5. 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 Meningioma, cerebral ([place])  D32.0     8. Status post gamma knife treatment  Z92.3     9. Acquired hypothyroidism  E03.9     10. Familial tremor  G25.0     11. Rosacea  L71.9     12. Essential hypertension  I10     13. Medication management  Z79.899     14. History of ADHD   Z86.59     15. Benign prostatic hyperplasia with lower urinary tract symptoms, symptom details unspecified  N40.1     16. 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 Chronic fatigue, unspecified  R53.82    Hypersomnia      Past Psychiatric History:  Care Everywhere reviewed-No New Info 03/26/2022 but old records from [name] are now present and copied below for his record [place] Date of recent admission: 10/29/21 Date of recent discharge: 11/01/21 [facility] [place] [facility] [place] 6/17/-03/04/2020  Pt with Opiate dependence MAT Suboxone at [facility] Lynnda Shields C/O of paranoia he is aware of but cant stop/completely control.Has rx for Risperdal from [employer]. Also hx of Gamma Knife tradiation fo benign brain tumor ?2018 Says has records Requesting Psychiatrist/medication mangement for paranoia hx [place] [facility] [place] 08/21/2021 - NOW [facility] INPATIENT  Date of Admission:  10/02/2021 Date of Discharge: 10/13/2021   [facility] Alcohol intoxication 03/26/2017  Alcohol withdrawal delirium 03/26/2017  Chronic daily headache 02/25/2017  Alcohol use disorder, severe, dependence 02/22/2017  Heroin dependence MAT Methadone [place]/Subutex TBR 01/2018  Anxiety disorder, unspecified 01/20/2017  Paranoia 03/2018    [facility] Unk Pinto, NP - 04/12/2017 12:38 PM EST Formatting of this note might be different from the original. [facility] - Substance Abuse PHP/IOP Intake Assessment  BH Formulate an individual and appropriate relapse prevention plan.   [place] SU Problem: Formation of a relapse prevention plan    [facility] Uncomplicated opioid dependence (CMS/HCC) 12/24/2017  1. Discharge from hospital. 2. Follow up at [facility]      Past Medical History:  Past Medical History:  Diagnosis Date   ADHD    Anxiety    Brain tumor ([place])    balance and occ memory issues and headaches   Brain tumor ([place])     sees [facility] q year   Depression    Headache    migraine and tension   Hypothyroidism    Soft tissue mass    left shoulder   Substance abuse ([place])     Past Surgical History:  Procedure Laterality Date   colonscopy     gamma knife radiation 2018 for brain tumor'     hematoma removed from arm Left    MASS EXCISION Left 06/30/2019   Procedure: EXCISION OF SOFT TISSUE  MASS LEFT SHOULDER;  Surgeon: Armandina Gemma, MD;  Location: [facility];  Service: General;  Laterality: Left;  LMA    Family Psychiatric History:  Alcoholism and drug abuse on father's side    Family History:   Hypertension Mother   Hypertension/Alcoholism/Addiction Father    Social History:  Social History   ocioeconomic History   Marital status: Single      Spouse name: Not on file   Number of children: NA   Years of education: Not on file   Highest education level: Not on file  Occupational History   Computer  Programmer until 2016  Tobacco Use   Smoking status: Former      Types: Cigarettes   Smokeless tobacco: Current Vape   Tobacco comments:      quit cigarettes jan 2020  Vaping Use   Vaping Use: Every day  Substance and Sexual Activity   Alcohol use: Not Currently      Comment: quit Oct 2019   Drug use: Not Currently      Types: Cocaine, IV, Heroin, MDMA (Ecstacy)      Comment: last reported use; cocaine and heroin in 2019; MDMA Feb 2023   Sexual activity: Not Currently  Other Topics Concern   Not on file  Social History Narrative    Living with mother now      Social Determinants of Health     Difficulty of Paying Living Expenses: Disability  Food Insecurity:    Worried About Charity fundraiser in the Last Year: On disability   Arboriculturist in the Last Year:   Transportation Needs:    Film/video editor (Medical): Uses Ecologist (Non-Medical):   Physical Activity:    Days of Exercise per Week:    Minutes of Exercise per Session:    Stress:    Feeling of Stress : Constant   Social Connections:    Frequency of Communication with Friends and Family: Mother recently kicked him out of house   Frequency of Social Gatherings with Friends and Family: Lives alone-   Attends Religious Services: No   Active Member of Clubs or Organizations: Not active with AA /NA   Attends Club or Organizati       Allergies:  Allergies  Allergen Reactions   Ingrezza [Valbenazine Tosylate] Other (See Comments)    Severe tongue chewing at 80 mg Tolerates lower doses   Naloxone Palpitations    Pt report MAT Rx is Subutex/Buprenorphine only   Amphetamine-Dextroamphetamine Other (See Comments)    Metabolic Disorder Labs: Lab Results  Component Value Date   HGBA1C 5.0 10/03/2021   MPG 96.8 10/03/2021   No results found for: "PROLACTIN" Lab Results  Component Value Date   CHOL 191 10/03/2021   TRIG 92 10/03/2021   HDL 48 10/03/2021   CHOLHDL 4.0 10/03/2021   VLDL 18 10/03/2021   LDLCALC 125 (H) 10/03/2021   Lab Results  Component Value Date   TSH 1.500 02/04/2022   TSH 0.417 (L) 11/10/2021    Therapeutic Level Labs: No results found for: "LITHIUM" No results found for: "VALPROATE" No results found for: "CBMZ"  Current Medications: Current Outpatient Medications  Medication Sig Dispense Refill   buprenorphine (SUBUTEX) 8 MG SUBL SL tablet Place 8 mg under the tongue 3 (three) times daily.     buPROPion ER (WELLBUTRIN SR) 100 MG 12 hr tablet Take 1 tablet (100 mg total) by mouth 2 (two) times daily. 60 tablet 1   doxycycline (VIBRAMYCIN) 50 MG capsule Take 1 capsule (50 mg total) by mouth daily. 90 capsule 1   gabapentin (NEURONTIN) 400 MG capsule Take 1 capsule (400 mg total) by mouth 2 (two) times daily for 180 doses. 180 capsule 0   haloperidol (HALDOL) 1 MG tablet Take 1 tablet (1 mg total) by mouth daily. 90 tablet 0   modafinil (PROVIGIL) 200 MG tablet Take 3 tablets (600 mg total) by mouth daily. 90 tablet 2    tamsulosin (FLOMAX) 0.4 MG CAPS capsule Take 0.8 mg by mouth daily.  thyroid (ARMOUR) 300 MG tablet Take 1/2 tablet daily 20 minutes before breakfast (Patient taking differently: Take 150 mg by mouth daily. Take 1/2 tablet daily 20 minutes before breakfast) 15 tablet 2   diphenhydrAMINE (BENADRYL) 50 MG tablet Take 2-4 tablets q6h as needed for restlessness related to Haldol 100 tablet 2   hydrOXYzine (VISTARIL) 25 MG capsule Take 1 capsule (25 mg total) by mouth 3 (three) times daily as needed for anxiety. 90 capsule 2   nicotine (NICODERM CQ - DOSED IN MG/24 HOURS) 21 mg/24hr patch Place 21 mg onto the skin daily.     propranolol (INDERAL) 20 MG tablet Take 20 mg by mouth 3 (three) times daily.     No current facility-administered medications for this visit.   Facility-Administered Medications Ordered in Other Visits  Medication Dose Route Frequency Provider Last Rate Last Admin   sodium chloride 0.9 % bolus 1,000 mL  1,000 mL Intravenous Once Azucena Cecil, PA-C   Held at 05/26/22 0008   Followed by   0.9 %  sodium chloride infusion  1,000 mL Intravenous Continuous Azucena Cecil, PA-C   Held at 05/26/22 0009   buprenorphine (SUBUTEX) sublingual tablet 8 mg  8 mg Sublingual TID Fatima Blank, MD   8 mg at 05/27/22 1125   buPROPion ER [facility] SR) 12 hr tablet 100 mg  100 mg Oral BID Motley-Mangrum, Jadeka A, PMHNP   100 mg at 05/27/22 1126   diphenhydrAMINE (BENADRYL) capsule 50 mg  50 mg Oral Q6H PRN Motley-Mangrum, Jadeka A, PMHNP   50 mg at 05/26/22 0108   gabapentin (NEURONTIN) capsule 400 mg  400 mg Oral BID Lacretia Leigh, MD   400 mg at 05/27/22 1125   haloperidol (HALDOL) tablet 1 mg  1 mg Oral Daily Motley-Mangrum, Jadeka A, PMHNP   1 mg at 05/27/22 1125   hydrOXYzine (ATARAX) tablet 25 mg  25 mg Oral TID PRN Motley-Mangrum, Jadeka A, PMHNP   25 mg at 05/26/22 0109   nicotine polacrilex (NICORETTE) gum 2 mg  2 mg Oral PRN Azucena Cecil, PA-C   2 mg  at 05/27/22 1338   tamsulosin (FLOMAX) capsule 0.8 mg  0.8 mg Oral QPC supper Lacretia Leigh, MD   0.8 mg at 05/26/22 2306     Musculoskeletal: Strength & Muscle Tone: Telepsych visit-Grossly normal Musculoskeletal and cranial nerve inspections Gait & Station: NA Patient leans: N/A Psychiatric Specialty Exam: Review of Systems  Constitutional:  Positive for fatigue (Chronic). Negative for activity change (Continues to write program and deveop his You Tube site).  Musculoskeletal:  Negative for arthralgias, back pain, gait problem, joint swelling, myalgias, neck pain and neck stiffness.  Skin:  Positive for color change and rash (Rosacea). Negative for pallor and wound.  Psychiatric/Behavioral:  Positive for sleep disturbance. Negative for agitation, behavioral problems, confusion, decreased concentration, dysphoric mood, hallucinations, self-injury and suicidal ideas. The patient is not nervous/anxious and is not hyperactive.     There were no vitals taken for this visit.There is no height or weight on file to calculate BMI.My Chart visit  General Appearance: Casual  Eye Contact:  Good  Speech:  Clear and Coherent and Normal Rate  Volume:  Normal  Mood:  Euthymic  Affect:  Appropriate and Congruent  Thought Process:  Coherent, Goal Directed, and Descriptions of Associations: Intact  Orientation:  Full (Time, Place, and Person)  Thought Content: WDL and Logical   Suicidal Thoughts:  No  Homicidal Thoughts:  No  Memory:  Affected by history of drug addiction and stroke   Judgement:  Intact  Insight:  Fair  Psychomotor Activity:  Normal  Concentration:  Concentration: Good and Attention Span: Good  Recall:  See memory Recent Max of Knowledge:  WDL  Language: Good  Akathisia:  Negative  Handed:  Right  AIMS (if indicated): not done  Assets:  Desire for Improvement Financial Resources/Insurance Housing Resilience Social Support Talents/Skills Vocational/Educational   ADL's:  Intact  Cognition: Impaired,  Moderate  Sleep:  Poor   Screenings: [facility] Office Visit from 11/27/2021 in [facility] Visit from 11/06/2021 in [facility] Office Visit from 10/23/2021 in [facility] Office Visit from 10/16/2021 in [facility] Admission (Discharged) from 10/02/2021 in [facility]  AIMS Total Score '4 7 5 21 '$ 0      Flowsheet Row Admission (Discharged) from 10/02/2021 in [facility] ED from 09/27/2021 in [facility] Emergency Department at [place] High Risk High Risk        Assessment and Plan: Stable FU 2 weeks   Paul Russian, PA-C 05/27/2022, 5:56 PM

## 2022-05-16 ENCOUNTER — Emergency Department (HOSPITAL_COMMUNITY): Payer: Medicare Other

## 2022-05-16 ENCOUNTER — Emergency Department (HOSPITAL_COMMUNITY)
Admission: EM | Admit: 2022-05-16 | Discharge: 2022-05-16 | Discharge status: Against Medical Advice | Payer: Medicare Other | Attending: Emergency Medicine | Admitting: Emergency Medicine

## 2022-05-16 ENCOUNTER — Other Ambulatory Visit: Payer: Self-pay

## 2022-05-16 DIAGNOSIS — Y900 Blood alcohol level of less than 20 mg/100 ml: Secondary | ICD-10-CM | POA: Diagnosis not present

## 2022-05-16 DIAGNOSIS — Z20822 Contact with and (suspected) exposure to covid-19: Secondary | ICD-10-CM | POA: Insufficient documentation

## 2022-05-16 DIAGNOSIS — R440 Auditory hallucinations: Secondary | ICD-10-CM | POA: Insufficient documentation

## 2022-05-16 DIAGNOSIS — Z5321 Procedure and treatment not carried out due to patient leaving prior to being seen by health care provider: Secondary | ICD-10-CM | POA: Insufficient documentation

## 2022-05-16 DIAGNOSIS — R519 Headache, unspecified: Secondary | ICD-10-CM | POA: Insufficient documentation

## 2022-05-16 DIAGNOSIS — H9203 Otalgia, bilateral: Secondary | ICD-10-CM | POA: Insufficient documentation

## 2022-05-16 LAB — CBC WITH DIFFERENTIAL/PLATELET
Abs Immature Granulocytes: 0.02 10*3/uL (ref 0.00–0.07)
Basophils Absolute: 0 10*3/uL (ref 0.0–0.1)
Basophils Relative: 0 %
Eosinophils Absolute: 0 10*3/uL (ref 0.0–0.5)
Eosinophils Relative: 0 %
HCT: 44.4 % (ref 39.0–52.0)
Hemoglobin: 15.6 g/dL (ref 13.0–17.0)
Immature Granulocytes: 0 %
Lymphocytes Relative: 11 %
Lymphs Abs: 0.8 10*3/uL (ref 0.7–4.0)
MCH: 31.5 pg (ref 26.0–34.0)
MCHC: 35.1 g/dL (ref 30.0–36.0)
MCV: 89.5 fL (ref 80.0–100.0)
Monocytes Absolute: 0.3 10*3/uL (ref 0.1–1.0)
Monocytes Relative: 5 %
Neutro Abs: 6 10*3/uL (ref 1.7–7.7)
Neutrophils Relative %: 84 %
Platelets: 240 10*3/uL (ref 150–400)
RBC: 4.96 MIL/uL (ref 4.22–5.81)
RDW: 12.7 % (ref 11.5–15.5)
WBC: 7.1 10*3/uL (ref 4.0–10.5)
nRBC: 0 % (ref 0.0–0.2)

## 2022-05-16 LAB — RESP PANEL BY RT-PCR (RSV, FLU A&B, COVID)  RVPGX2
Influenza A by PCR: NEGATIVE
Influenza B by PCR: NEGATIVE
Resp Syncytial Virus by PCR: NEGATIVE
SARS Coronavirus 2 by RT PCR: NEGATIVE

## 2022-05-16 LAB — COMPREHENSIVE METABOLIC PANEL
ALT: 45 U/L — ABNORMAL HIGH (ref 0–44)
AST: 38 U/L (ref 15–41)
Albumin: 4.8 g/dL (ref 3.5–5.0)
Alkaline Phosphatase: 78 U/L (ref 38–126)
Anion gap: 15 (ref 5–15)
BUN: 16 mg/dL (ref 6–20)
CO2: 20 mmol/L — ABNORMAL LOW (ref 22–32)
Calcium: 9.5 mg/dL (ref 8.9–10.3)
Chloride: 99 mmol/L (ref 98–111)
Creatinine, Ser: 1.26 mg/dL — ABNORMAL HIGH (ref 0.61–1.24)
GFR, Estimated: >60 mL/min (ref >60)
Glucose, Bld: 83 mg/dL (ref 70–99)
Potassium: 4.4 mmol/L (ref 3.5–5.1)
Sodium: 134 mmol/L — ABNORMAL LOW (ref 135–145)
Total Bilirubin: 0.9 mg/dL (ref 0.3–1.2)
Total Protein: 7.5 g/dL (ref 6.5–8.1)

## 2022-05-16 LAB — SALICYLATE LEVEL: Salicylate Lvl: <7 mg/dL — ABNORMAL LOW (ref 7.0–30.0)

## 2022-05-16 LAB — RAPID URINE DRUG SCREEN, HOSP PERFORMED
Amphetamines: NOT DETECTED
Barbiturates: NOT DETECTED
Benzodiazepines: NOT DETECTED
Cocaine: NOT DETECTED
Opiates: NOT DETECTED
Tetrahydrocannabinol: NOT DETECTED

## 2022-05-16 LAB — ETHANOL: Alcohol, Ethyl (B): <10 mg/dL (ref <10)

## 2022-05-16 LAB — ACETAMINOPHEN LEVEL: Acetaminophen (Tylenol), Serum: <10 ug/mL — ABNORMAL LOW (ref 10–30)

## 2022-05-16 NOTE — ED Provider Triage Note (Signed)
Emergency Medicine Provider Triage Evaluation Note  Paul Bray , a 57 y.o. male  was evaluated in triage.  Pt complains of hearing voices. He states this is new. pt with new auditory hallucinations (seemingly) - he is quite concerned he has cochlear implants and thinks that's why he is hearing voices but has no recollection of having implants.   He states the voices are intermittent with some interference/white noise sounds.  Denies any headache.  No head trauma.  Denies any SI or HI.  Review of Systems  Positive: Hearing voices Negative: Fever  Physical Exam  BP (!) 154/103   Pulse 84   Temp 98.1 F (36.7 C)   Resp 16   SpO2 95%  Gen:   Awake, no distress   Resp:  Normal effort  MSK:   Moves extremities without difficulty Other:  Responding to internal stimuli  Medical Decision Making  Medically screening exam initiated at 2:56 AM.  Appropriate orders placed.  Paul Bray was informed that the remainder of the evaluation will be completed by another provider, this initial triage assessment does not replace that evaluation, and the importance of remaining in the ED until their evaluation is complete.  Labs for med clearance  CT head   Tedd Sias, [place] 05/16/22 0932

## 2022-05-16 NOTE — ED Triage Notes (Signed)
Patient reports bilateral ear pain onset yesterday , denies injury /no drainage or bleeding , denies hearing loss.

## 2022-05-16 NOTE — ED Notes (Signed)
Called pt x3 for room, no response.

## 2022-05-25 ENCOUNTER — Other Ambulatory Visit: Payer: Self-pay

## 2022-05-25 ENCOUNTER — Ambulatory Visit (HOSPITAL_BASED_OUTPATIENT_CLINIC_OR_DEPARTMENT_OTHER): Payer: Medicare Other | Admitting: Medical

## 2022-05-25 ENCOUNTER — Emergency Department (HOSPITAL_COMMUNITY)
Admission: EM | Admit: 2022-05-25 | Discharge: 2022-05-27 | Disposition: A | Discharge status: Other Acute Inpatient Hospital | Payer: Medicare Other | Attending: Emergency Medicine | Admitting: Emergency Medicine

## 2022-05-25 DIAGNOSIS — Z85841 Personal history of malignant neoplasm of brain: Secondary | ICD-10-CM | POA: Diagnosis not present

## 2022-05-25 DIAGNOSIS — F411 Generalized anxiety disorder: Secondary | ICD-10-CM | POA: Diagnosis not present

## 2022-05-25 DIAGNOSIS — F1921 Other psychoactive substance dependence, in remission: Secondary | ICD-10-CM | POA: Diagnosis not present

## 2022-05-25 DIAGNOSIS — I6381 Other cerebral infarction due to occlusion or stenosis of small artery: Secondary | ICD-10-CM | POA: Diagnosis not present

## 2022-05-25 DIAGNOSIS — Z1152 Encounter for screening for COVID-19: Secondary | ICD-10-CM | POA: Diagnosis not present

## 2022-05-25 DIAGNOSIS — R259 Unspecified abnormal involuntary movements: Secondary | ICD-10-CM | POA: Diagnosis not present

## 2022-05-25 DIAGNOSIS — D320 Benign neoplasm of cerebral meninges: Secondary | ICD-10-CM | POA: Diagnosis not present

## 2022-05-25 DIAGNOSIS — Z79899 Other long term (current) drug therapy: Secondary | ICD-10-CM | POA: Diagnosis not present

## 2022-05-25 DIAGNOSIS — F29 Unspecified psychosis not due to a substance or known physiological condition: Secondary | ICD-10-CM | POA: Insufficient documentation

## 2022-05-25 DIAGNOSIS — R440 Auditory hallucinations: Secondary | ICD-10-CM | POA: Diagnosis not present

## 2022-05-25 DIAGNOSIS — E878 Other disorders of electrolyte and fluid balance, not elsewhere classified: Secondary | ICD-10-CM | POA: Insufficient documentation

## 2022-05-25 DIAGNOSIS — Z046 Encounter for general psychiatric examination, requested by authority: Secondary | ICD-10-CM

## 2022-05-25 DIAGNOSIS — I10 Essential (primary) hypertension: Secondary | ICD-10-CM

## 2022-05-25 DIAGNOSIS — G250 Essential tremor: Secondary | ICD-10-CM

## 2022-05-25 DIAGNOSIS — F060 Psychotic disorder with hallucinations due to known physiological condition: Secondary | ICD-10-CM

## 2022-05-25 DIAGNOSIS — E871 Hypo-osmolality and hyponatremia: Secondary | ICD-10-CM | POA: Insufficient documentation

## 2022-05-25 DIAGNOSIS — E039 Hypothyroidism, unspecified: Secondary | ICD-10-CM

## 2022-05-25 DIAGNOSIS — L719 Rosacea, unspecified: Secondary | ICD-10-CM

## 2022-05-25 DIAGNOSIS — Z923 Personal history of irradiation: Secondary | ICD-10-CM

## 2022-05-25 LAB — URINALYSIS, ROUTINE W REFLEX MICROSCOPIC
Bilirubin Urine: NEGATIVE
Glucose, UA: NEGATIVE mg/dL
Hgb urine dipstick: NEGATIVE
Ketones, ur: NEGATIVE mg/dL
Leukocytes,Ua: NEGATIVE
Nitrite: NEGATIVE
Protein, ur: NEGATIVE mg/dL
Specific Gravity, Urine: 1.005 (ref 1.005–1.030)
pH: 6 (ref 5.0–8.0)

## 2022-05-25 LAB — CBC
HCT: 43.6 % (ref 39.0–52.0)
Hemoglobin: 15.9 g/dL (ref 13.0–17.0)
MCH: 31.4 pg (ref 26.0–34.0)
MCHC: 36.5 g/dL — ABNORMAL HIGH (ref 30.0–36.0)
MCV: 86.2 fL (ref 80.0–100.0)
Platelets: 266 10*3/uL (ref 150–400)
RBC: 5.06 MIL/uL (ref 4.22–5.81)
RDW: 12.5 % (ref 11.5–15.5)
WBC: 6 10*3/uL (ref 4.0–10.5)
nRBC: 0 % (ref 0.0–0.2)

## 2022-05-25 LAB — BASIC METABOLIC PANEL
Anion gap: 10 (ref 5–15)
BUN: 9 mg/dL (ref 6–20)
CO2: 24 mmol/L (ref 22–32)
Calcium: 9.5 mg/dL (ref 8.9–10.3)
Chloride: 96 mmol/L — ABNORMAL LOW (ref 98–111)
Creatinine, Ser: 0.9 mg/dL (ref 0.61–1.24)
GFR, Estimated: >60 mL/min (ref >60)
Glucose, Bld: 111 mg/dL — ABNORMAL HIGH (ref 70–99)
Potassium: 4 mmol/L (ref 3.5–5.1)
Sodium: 130 mmol/L — ABNORMAL LOW (ref 135–145)

## 2022-05-25 LAB — ETHANOL: Alcohol, Ethyl (B): <10 mg/dL (ref <10)

## 2022-05-25 LAB — RAPID URINE DRUG SCREEN, HOSP PERFORMED
Amphetamines: NOT DETECTED
Barbiturates: NOT DETECTED
Benzodiazepines: NOT DETECTED
Cocaine: NOT DETECTED
Opiates: NOT DETECTED
Tetrahydrocannabinol: NOT DETECTED

## 2022-05-25 LAB — RESP PANEL BY RT-PCR (RSV, FLU A&B, COVID)  RVPGX2
Influenza A by PCR: NEGATIVE
Influenza B by PCR: NEGATIVE
Resp Syncytial Virus by PCR: NEGATIVE
SARS Coronavirus 2 by RT PCR: NEGATIVE

## 2022-05-25 LAB — ACETAMINOPHEN LEVEL: Acetaminophen (Tylenol), Serum: <10 ug/mL — ABNORMAL LOW (ref 10–30)

## 2022-05-25 LAB — SALICYLATE LEVEL: Salicylate Lvl: <7 mg/dL — ABNORMAL LOW (ref 7.0–30.0)

## 2022-05-25 MED ORDER — BUPROPION HCL ER (SR) 100 MG PO TB12
100.0000 mg | ORAL_TABLET | Freq: Two times a day (BID) | ORAL | Status: DC
  Administered 2022-05-25 – 2022-05-27 (×4): 100 mg via ORAL
  Filled 2022-05-25 (×6): qty 1

## 2022-05-25 MED ORDER — BUPRENORPHINE HCL 8 MG SL SUBL
8.0000 mg | SUBLINGUAL_TABLET | Freq: Every day | SUBLINGUAL | Status: DC
  Administered 2022-05-25: 8 mg via SUBLINGUAL
  Filled 2022-05-25: qty 1

## 2022-05-25 MED ORDER — HALOPERIDOL 1 MG PO TABS
1.0000 mg | ORAL_TABLET | Freq: Every day | ORAL | Status: DC
  Administered 2022-05-26 – 2022-05-27 (×2): 1 mg via ORAL
  Filled 2022-05-25 (×2): qty 1

## 2022-05-25 MED ORDER — LORAZEPAM 1 MG PO TABS
1.0000 mg | ORAL_TABLET | Freq: Once | ORAL | Status: AC
  Administered 2022-05-25: 1 mg via ORAL
  Filled 2022-05-25: qty 1

## 2022-05-25 MED ORDER — NICOTINE POLACRILEX 2 MG MT GUM
2.0000 mg | CHEWING_GUM | OROMUCOSAL | Status: DC | PRN
  Administered 2022-05-25 – 2022-05-27 (×6): 2 mg via ORAL
  Filled 2022-05-25 (×6): qty 1

## 2022-05-25 MED ORDER — HYDROXYZINE HCL 25 MG PO TABS
25.0000 mg | ORAL_TABLET | Freq: Three times a day (TID) | ORAL | Status: DC | PRN
  Administered 2022-05-26: 25 mg via ORAL
  Filled 2022-05-25: qty 1

## 2022-05-25 MED ORDER — SODIUM CHLORIDE 0.9 % IV SOLN: 1000.0000 mL | INTRAVENOUS | Status: DC

## 2022-05-25 MED ORDER — DIPHENHYDRAMINE HCL 25 MG PO CAPS
50.0000 mg | ORAL_CAPSULE | Freq: Four times a day (QID) | ORAL | Status: DC | PRN
  Administered 2022-05-26: 50 mg via ORAL
  Filled 2022-05-25: qty 2

## 2022-05-25 MED ORDER — NICOTINE 21 MG/24HR TD PT24
21.0000 mg | MEDICATED_PATCH | Freq: Once | TRANSDERMAL | Status: AC
  Administered 2022-05-25: 21 mg via TRANSDERMAL
  Filled 2022-05-25: qty 1

## 2022-05-25 MED ORDER — SODIUM CHLORIDE 0.9 % IV BOLUS (SEPSIS): 1000.0000 mL | Freq: Once | INTRAVENOUS | Status: DC

## 2022-05-25 NOTE — ED Triage Notes (Signed)
Pt states he has been hearing voices x 1 week, to do crimes and kill his mother. Has had these voices in the past several months ago. Denies SI/HI. States voices are holding him hostage and cannot sleep, or function.

## 2022-05-25 NOTE — ED Notes (Addendum)
Pt brought back into TCU RM 52, belongings put into locker #29. Pt stated that they wanted to go by "Paul Bray"

## 2022-05-25 NOTE — ED Notes (Signed)
Pt belonging bags moved to under the desk in front of Rm 25/Hall C.

## 2022-05-25 NOTE — Progress Notes (Signed)
Patient ID: Paul Bray, male   DOB: 05/08/1965, 57 y.o.   MRN: [id number]  On Saturday there was a text from [name] about his sense that somone was trying to steal his identity. I texted him and his mother to increase his Haldol to 3 mg and come to Office Monday at 3:30 He was scheduled outside normal work hours by myself to come in at 3:30 pm for assessment. At about 1:30 I received call on my private cell from patient that I sent to VM. Immedately thereafter his mother called.  She informed me that [name] had insisted on her bringing him here now instead of waiting for his appointment. She said his reason was that the voices were getting worse.(He had been free of hallucinosis until he went into his mother's room where she kept his medication and monitored his use) and took 8 Suboxone and 6 Modanafil) last Thursday  Since doing this, his hallucinations and paranoia have returned. This is the same pattern of self medicating that led to his last hospitalization and his Mother agreeing to take him in from low cost housing and supervise his taking of medication. I told her and [name] this office is not suitable for this type of visit and that I needed for him to go to the [facility] for assessment and stabilization. They agreed

## 2022-05-25 NOTE — Progress Notes (Signed)
Inpatient Behavioral Health Placement  Pt meets inpatient criteria per Michaele Offer, PMHNP. There are no available beds at [facility] per [place].  Referral was sent to the following facilities;    Destination  Service Provider Address Phone Fax  [facility]  [address], [place] [address] [phone] [facility]  [place], [place] [address] [phone]  [facility]  [place], [place] [address] [phone]  [facility]  [place] [address] [place] [address] [place]  [facility]  [address], [place] [address] [phone]  [place] [address] [place] [address] [phone]  [facility]  [place] [address] [phone]  [facility]  [address], [place] [address] [phone] [place]  [address] [place] [address] [phone] [facility]  [address], [place] [address] [phone]  [facility]  [address] [place], [place] [address] [place]  [facility]  [address], [place] [address] [phone]   Situation ongoing,  CSW will follow up.   Benjaman Kindler, MSW, [facility] 05/25/2022  @ 10:58 PM

## 2022-05-25 NOTE — ED Notes (Addendum)
Pt has 2 bags of belongings

## 2022-05-25 NOTE — ED Notes (Addendum)
Belonging bag x2 labeled and placed in triage cabinet. Lower shelf.

## 2022-05-25 NOTE — ED Provider Notes (Cosign Needed Addendum)
[facility] Provider Note   CSN: [id number] Arrival date & time: 05/25/22  1317     History  Chief Complaint  Patient presents with   Hearing Voices    Paul Bray is a 57 y.o. male with medical history of ADHD, anxiety, brain tumor, headache, substance abuse, mental disorder.  Patient presents to ED for evaluation of auditory hallucinations. Patient reports that for the last 1 week he has had voices in his head telling him to kill his mother.  The patient states the voices are also telling him they will steal his identy/accuse him of financial fraud. Patient has history of mental disorder currently being followed by outpatient psychiatric team, he is unable to relay to me if he is compliant on medication.  Patient denies any medical complaints. First examination, IVC paperwork has been filled out.   HPI     Home Medications Prior to Admission medications   Medication Sig Start Date End Date Taking? Authorizing Provider  buprenorphine (SUBUTEX) 8 MG SUBL SL tablet Place 24 mg under the tongue daily as needed. 11/11/21   [provider]  buPROPion ER (WELLBUTRIN SR) 100 MG 12 hr tablet Take 1 tablet (100 mg total) by mouth 2 (two) times daily. 04/16/22 06/15/22  Dara Hoyer, PA-C  diphenhydrAMINE (BENADRYL) 50 MG tablet Take 2-4 tablets q6h as needed for restlessness related to Haldol 12/11/21   Dara Hoyer, PA-C  doxycycline (VIBRAMYCIN) 50 MG capsule Take 1 capsule (50 mg total) by mouth daily. 12/25/21 06/23/22  Dara Hoyer, PA-C  gabapentin (NEURONTIN) 400 MG capsule Take 1 capsule (400 mg total) by mouth 2 (two) times daily for 180 doses. 04/16/22 07/15/22  Dara Hoyer, PA-C  haloperidol (HALDOL) 1 MG tablet Take 1 tablet (1 mg total) by mouth daily. 03/12/22 06/10/22  Dara Hoyer, PA-C  hydrOXYzine (VISTARIL) 25 MG capsule Take 1 capsule (25 mg total) by mouth 3 (three) times daily as needed for anxiety.  03/12/22 06/10/22  Dara Hoyer, PA-C  modafinil (PROVIGIL) 200 MG tablet Take 3 tablets (600 mg total) by mouth daily. 03/12/22 06/10/22  Dara Hoyer, PA-C  polyethylene glycol (MIRALAX / GLYCOLAX) 17 g packet Take 17 g by mouth daily. 10/14/21   Nicholes Rough, NP  propranolol (INDERAL) 20 MG tablet Take 20 mg by mouth 3 (three) times daily. 11/03/21   [provider]  tamsulosin (FLOMAX) 0.4 MG CAPS capsule Take by mouth. 02/20/22   [provider]  thyroid (ARMOUR) 300 MG tablet Take 1/2 tablet daily 20 minutes before breakfast 04/16/22   Dara Hoyer, PA-C      Allergies    Ingrezza [valbenazine tosylate], Naloxone, and Amphetamine-dextroamphetamine    Review of Systems   Review of Systems  Psychiatric/Behavioral:  Positive for hallucinations.   All other systems reviewed and are negative.   Physical Exam Updated Vital Signs BP (!) 141/111   Pulse 88   Temp 98.4 F (36.9 C)   Resp 18   SpO2 100%  Physical Exam Vitals and nursing note reviewed.  Constitutional:      General: He is not in acute distress.    Appearance: He is well-developed.  HENT:     Head: Normocephalic and atraumatic.  Eyes:     Conjunctiva/sclera: Conjunctivae normal.  Cardiovascular:     Rate and Rhythm: Normal rate and regular rhythm.     Heart sounds: No murmur heard. Pulmonary:  Effort: Pulmonary effort is normal. No respiratory distress.     Breath sounds: Normal breath sounds.  Abdominal:     Palpations: Abdomen is soft.     Tenderness: There is no abdominal tenderness.  Musculoskeletal:        General: No swelling.     Cervical back: Neck supple.  Skin:    General: Skin is warm and dry.     Capillary Refill: Capillary refill takes less than 2 seconds.  Neurological:     Mental Status: He is alert and oriented to person, place, and time. Mental status is at baseline.     ED Results / Procedures / Treatments   Labs (all labs ordered are listed, but only  abnormal results are displayed) Labs Reviewed  CBC - Abnormal; Notable for the following components:      Result Value   MCHC 36.5 (*)    All other components within normal limits  BASIC METABOLIC PANEL - Abnormal; Notable for the following components:   Sodium 130 (*)    Chloride 96 (*)    Glucose, Bld 111 (*)    All other components within normal limits  ACETAMINOPHEN LEVEL - Abnormal; Notable for the following components:   Acetaminophen (Tylenol), Serum <10 (*)    All other components within normal limits  SALICYLATE LEVEL - Abnormal; Notable for the following components:   Salicylate Lvl <7.0 (*)    All other components within normal limits  URINALYSIS, ROUTINE W REFLEX MICROSCOPIC - Abnormal; Notable for the following components:   Color, Urine STRAW (*)    All other components within normal limits  RESP PANEL BY RT-PCR (RSV, FLU A&B, COVID)  RVPGX2  ETHANOL  RAPID URINE DRUG SCREEN, HOSP PERFORMED    EKG EKG Interpretation  Date/Time:  Monday May 25 2022 14:34:40 EST Ventricular Rate:  80 PR Interval:  178 QRS Duration: 83 QT Interval:  388 QTC Calculation: 448 R Axis:   17 Text Interpretation: Interpretation limited secondary to artifact Sinus rhythm ST elev, probable normal early repol pattern Baseline wander in lead(s) II aVR aVF V1 Confirmed by Margaretmary Eddy [phone]) on 05/25/2022 6:17:50 PM  Radiology No results found.  Procedures Procedures    Medications Ordered in ED Medications  nicotine (NICODERM CQ - dosed in mg/24 hours) patch 21 mg (21 mg Transdermal Patch Applied 05/25/22 1630)  buprenorphine (SUBUTEX) sublingual tablet 8 mg (8 mg Sublingual Given 05/25/22 1712)  nicotine polacrilex (NICORETTE) gum 2 mg (2 mg Oral Given 05/25/22 1816)  buPROPion ER (WELLBUTRIN SR) 12 hr tablet 100 mg (has no administration in time range)  haloperidol (HALDOL) tablet 1 mg (has no administration in time range)  diphenhydrAMINE (BENADRYL) capsule 50 mg (has no  administration in time range)  hydrOXYzine (ATARAX) tablet 25 mg (has no administration in time range)  sodium chloride 0.9 % bolus 1,000 mL (has no administration in time range)    Followed by  0.9 %  sodium chloride infusion (has no administration in time range)  LORazepam (ATIVAN) tablet 1 mg (1 mg Oral Given 05/25/22 1630)  LORazepam (ATIVAN) tablet 1 mg (1 mg Oral Given 05/25/22 1807)    ED Course/ Medical Decision Making/ A&P  Medical Decision Making Amount and/or Complexity of Data Reviewed Labs: ordered.  Risk OTC drugs. Prescription drug management.   57 year old male presents to ED for evaluation of auditory hallucination.  Please see HPI for further details.  On examination patient alert and oriented x 3.  Patient afebrile and nontachycardic.  Patient lung sounds are clear bilaterally, he is not hypoxic on room air.  The patient abdomen is soft and compressible throughout.  Patient presents stating that he is hearing voices telling him to kill his mother.  On chart review, patient has extensive history of mental disorder.  Patient IVCd at this time.  Patient lab work unremarkable.  CBC without leukocytosis or anemia.  BMP with decreased sodium to 130, decreased chloride at 96, patient was placed on maintenance fluids.  Salicylate level, acetaminophen level, ethanol undetectable.  Patient rapid urine drug screen with no positive results.  Patient viral panel negative for all.  Patient urinalysis unremarkable. EKG unremarkable.   Patient provided 20 mg NicoDerm patch.  Patient reports that this was not helping his nicotine desire, 2 mg gum administered at this time.  Patient provided 8 mg Subutex, confirmed on chart review. Patient also provided '2mg'$  ativan for anxiety. TTS consult placed.  TTS determined this patient meets inpatient admission criteria for inpatient psychiatric treatment. Patient medically cleared at this time. MedRec has not been completed yet.   Final  Clinical Impression(s) / ED Diagnoses Final diagnoses:  Auditory hallucination  Involuntary commitment    Rx / DC Orders ED Discharge Orders     None          Lawana Chambers 05/25/22 1943    Carmin Muskrat, MD 05/26/22 [phone]

## 2022-05-25 NOTE — Consult Note (Signed)
[facility] ED ASSESSMENT   Reason for Consult:  Psych Consult Referring Physician:  C. Elie Confer, PA-C Patient Identification: Paul Bray MRN:  [id number] ED Chief Complaint: Psychosis [facility])  Diagnosis:  Principal Problem:   Psychosis ([place]) Active Problems:   GAD (generalized anxiety disorder)   ED Assessment Time Calculation: Start Time: 1600 Stop Time: 0000 Total Time in Minutes (Assessment Completion): 480   HPI:  Per Triage Note "Pt states he has been hearing voices x 1 week, to do crimes and kill his mother. Has had these voices in the past several months ago. Denies SI/HI. States voices are holding him hostage and cannot sleep, or function".   Subjective:  Paul Bray, 57 y.o., male patient seen face to face by this provider, consulted with Dr. Dwyane Dee; and chart reviewed on 05/25/22. During evaluation Paul Bray is sitting on the hospital gurney, with each hand on the side of his head, holding his head saying he was anxious.  Provider asked if he remember why he was here, patient said because he was hearing voices for 1 week to do crimes and kill his mother.  Provider asked patient if he wanted to kill his mother and he said no frustrated, "I do not want to hurt her, the voices want to hurt her ".  As patient is talking with provider patient does have some thought blocking going on as he appears to be responding to internal stimuli as he is answering questions.  Patient then tells provider "I am just so anxious", and begins holding his head. Patient is alert and oriented x 3 anxious can be seen having some thought blocking as is talking with provider.  He has normal speech increased volume.  Patient is not able to tell me if he has been in his psychiatric hospital before, patient does not remember the medications he has been taking.  Says that he lives with his mother, "you can talk to her".  Patient UDS is negative for illicit drugs or alcohol.  As provider is talking with patient,  patient is asking the nurse for Subutex, something for anxiety and for Nicorette gum.  Provided to the patient she will talk with him later once he receives medication for anxiety and he said "okay ".   Past Psychiatric History: Per chart review patient has a long list of psychiatric disorders, and has been admitted to psychiatric facilities.  Risk to Self or Others: Is the patient at risk to self? Patient denies Has the patient been a risk to self in the past 6 months? Patient denies Has the patient been a risk to self within the distant past? Patient denies Is the patient a risk to others? Patient denies Has the patient been a risk to others in the past 6 months? Patient denies Has the patient been a risk to others within the distant past? Patient denies  [place]:  [place] [facility] from 05/25/2022 in [facility] Emergency Department at [facility] ED from 05/16/2022 in [facility] Emergency Department at [facility] Admission (Discharged) from 10/02/2021 in [place] [facility]  C-SSRS RISK CATEGORY No Risk No Risk High Risk       AIMS:  , , ,  ,   ASAM:    Substance Abuse:     Past Medical History:  Past Medical History:  Diagnosis Date   ADHD    Anxiety    Brain tumor ([place])    balance and occ memory  issues and headaches   Brain tumor ([place])    sees [facility] q year   Depression    Headache    migraine and tension   Hypothyroidism    Soft tissue mass    left shoulder   Substance abuse [facility])     Past Surgical History:  Procedure Laterality Date   colonscopy     gamma knife radiation 2018 for brain tumor'     hematoma removed from arm Left    MASS EXCISION Left 06/30/2019   Procedure: EXCISION OF SOFT TISSUE  MASS LEFT SHOULDER;  Surgeon: Armandina Gemma, MD;  Location: [place];  Service: General;  Laterality: Left;  LMA   Family History: No family history on file.    Social History:  Social History    Substance and Sexual Activity  Alcohol Use Not Currently   Comment: quit Oct 2019     Social History   Substance and Sexual Activity  Drug Use Not Currently   Types: Cocaine, IV, Heroin, MDMA (Ecstacy)   Comment: last reported use; cocaine and heroin in 2019; MDMA Feb 2023    Social History   Socioeconomic History   Marital status: Single    Spouse name: Not on file   Number of children: Not on file   Years of education: Not on file   Highest education level: Not on file  Occupational History   Not on file  Tobacco Use   Smoking status: Former    Types: Cigarettes   Smokeless tobacco: Current   Tobacco comments:    quit jan 2020  Vaping Use   Vaping Use: Every day  Substance and Sexual Activity   Alcohol use: Not Currently    Comment: quit Oct 2019   Drug use: Not Currently    Types: Cocaine, IV, Heroin, MDMA (Ecstacy)    Comment: last reported use; cocaine and heroin in 2019; MDMA Feb 2023   Sexual activity: Not Currently  Other Topics Concern   Not on file  Social History Narrative   Not on file   Social Determinants of Health   Financial Resource Strain: Not on file  Food Insecurity: Not on file  Transportation Needs: Not on file  Physical Activity: Not on file  Stress: Not on file  Social Connections: Not on file    Allergies:   Allergies  Allergen Reactions   Ingrezza [Valbenazine Tosylate] Other (See Comments)    Severe tongue chewing at 80 mg Tolerates lower doses   Naloxone Palpitations    Pt report MAT Rx is Subutex/Buprenorphine only   Amphetamine-Dextroamphetamine Other (See Comments)    Labs:  Results for orders placed or performed during the hospital encounter of 05/25/22 (from the past 48 hour(s))  Rapid urine drug screen (hospital performed)     Status: None   Collection Time: 05/25/22  2:28 PM  Result Value Ref Range   Opiates NONE DETECTED NONE DETECTED   Cocaine NONE DETECTED NONE DETECTED   Benzodiazepines NONE DETECTED NONE  DETECTED   Amphetamines NONE DETECTED NONE DETECTED   Tetrahydrocannabinol NONE DETECTED NONE DETECTED   Barbiturates NONE DETECTED NONE DETECTED    Comment: (NOTE) DRUG SCREEN FOR MEDICAL PURPOSES ONLY.  IF CONFIRMATION IS NEEDED FOR ANY PURPOSE, NOTIFY LAB WITHIN 5 DAYS.  LOWEST DETECTABLE LIMITS FOR URINE DRUG SCREEN Drug Class                     Cutoff (ng/mL) Amphetamine and metabolites  1000 Barbiturate and metabolites    200 Benzodiazepine                 200 Opiates and metabolites        300 Cocaine and metabolites        300 THC                            50 Performed at [facility], [place] [address] [place], [place] [address]   Urinalysis, Routine w reflex microscopic -Urine, Clean Catch     Status: Abnormal   Collection Time: 05/25/22  2:28 PM  Result Value Ref Range   Color, Urine STRAW (A) YELLOW   APPearance CLEAR CLEAR   Specific Gravity, Urine 1.005 1.005 - 1.030   pH 6.0 5.0 - 8.0   Glucose, UA NEGATIVE NEGATIVE mg/dL   Hgb urine dipstick NEGATIVE NEGATIVE   Bilirubin Urine NEGATIVE NEGATIVE   Ketones, ur NEGATIVE NEGATIVE mg/dL   Protein, ur NEGATIVE NEGATIVE mg/dL   Nitrite NEGATIVE NEGATIVE   Leukocytes,Ua NEGATIVE NEGATIVE    Comment: Performed at [place] [address], [place], [place] [address]  CBC     Status: Abnormal   Collection Time: 05/25/22  3:03 PM  Result Value Ref Range   WBC 6.0 4.0 - 10.5 K/uL   RBC 5.06 4.22 - 5.81 MIL/uL   Hemoglobin 15.9 13.0 - 17.0 g/dL   HCT 43.6 39.0 - 52.0 %   MCV 86.2 80.0 - 100.0 fL   MCH 31.4 26.0 - 34.0 pg   MCHC 36.5 (H) 30.0 - 36.0 g/dL   RDW 12.5 11.5 - 15.5 %   Platelets 266 150 - 400 K/uL   nRBC 0.0 0.0 - 0.2 %    Comment: Performed at [facility], [place] [address], [place], [place] [address]  Basic metabolic panel     Status: Abnormal   Collection Time: 05/25/22  3:03 PM  Result Value Ref Range   Sodium 130 (L) 135 - 145 mmol/L    Potassium 4.0 3.5 - 5.1 mmol/L   Chloride 96 (L) 98 - 111 mmol/L   CO2 24 22 - 32 mmol/L   Glucose, Bld 111 (H) 70 - 99 mg/dL    Comment: Glucose reference range applies only to samples taken after fasting for at least 8 hours.   BUN 9 6 - 20 mg/dL   Creatinine, Ser 0.90 0.61 - 1.24 mg/dL   Calcium 9.5 8.9 - 10.3 mg/dL   GFR, Estimated >60 >60 mL/min    Comment: (NOTE) Calculated using the CKD-EPI Creatinine Equation (2021)    Anion gap 10 5 - 15    Comment: Performed at [facility], [place] [address] [place], [place] [address]  Acetaminophen level     Status: Abnormal   Collection Time: 05/25/22  3:03 PM  Result Value Ref Range   Acetaminophen (Tylenol), Serum <10 (L) 10 - 30 ug/mL    Comment: (NOTE) Therapeutic concentrations vary significantly. A range of 10-30 ug/mL  may be an effective concentration for many patients. However, some  are best treated at concentrations outside of this range. Acetaminophen concentrations >150 ug/mL at 4 hours after ingestion  and >50 ug/mL at 12 hours after ingestion are often associated with  toxic reactions.  Performed at [facility], [place] [address] [place], [place] [address]   Ethanol     Status: None  Collection Time: 05/25/22  3:03 PM  Result Value Ref Range   Alcohol, Ethyl (B) <10 <10 mg/dL    Comment: (NOTE) Lowest detectable limit for serum alcohol is 10 mg/dL.  For medical purposes only. Performed at [facility], [place] [address], [place], [place] [address]   Salicylate level     Status: Abnormal   Collection Time: 05/25/22  3:03 PM  Result Value Ref Range   Salicylate Lvl <2.4 (L) 7.0 - 30.0 mg/dL    Comment: Performed at [facility], [place] [address] [place], [place] [address]  Resp panel by RT-PCR (RSV, Flu A&B, Covid) Anterior Nasal Swab     Status: None   Collection Time: 05/25/22  3:49 PM   Specimen: Anterior Nasal Swab  Result Value Ref  Range   SARS Coronavirus 2 by RT PCR NEGATIVE NEGATIVE    Comment: (NOTE) SARS-CoV-2 target nucleic acids are NOT DETECTED.  The SARS-CoV-2 RNA is generally detectable in upper respiratory specimens during the acute phase of infection. The lowest concentration of SARS-CoV-2 viral copies this assay can detect is 138 copies/mL. A negative result does not preclude SARS-Cov-2 infection and should not be used as the sole basis for treatment or other patient management decisions. A negative result may occur with  improper specimen collection/handling, submission of specimen other than nasopharyngeal swab, presence of viral mutation(s) within the areas targeted by this assay, and inadequate number of viral copies(<138 copies/mL). A negative result must be combined with clinical observations, patient history, and epidemiological information. The expected result is Negative.  Fact Sheet for Patients:  EntrepreneurPulse.com.au  Fact Sheet for Healthcare Providers:  IncredibleEmployment.be  This test is no t yet approved or cleared by the [place] FDA and  has been authorized for detection and/or diagnosis of SARS-CoV-2 by FDA under an Emergency Use Authorization (EUA). This EUA will remain  in effect (meaning this test can be used) for the duration of the COVID-19 declaration under Section 564(b)(1) of the Act, 21 U.S.C.section 360bbb-3(b)(1), unless the authorization is terminated  or revoked sooner.       Influenza A by PCR NEGATIVE NEGATIVE   Influenza B by PCR NEGATIVE NEGATIVE    Comment: (NOTE) The Xpert Xpress SARS-CoV-2/FLU/RSV plus assay is intended as an aid in the diagnosis of influenza from Nasopharyngeal swab specimens and should not be used as a sole basis for treatment. Nasal washings and aspirates are unacceptable for Xpert Xpress SARS-CoV-2/FLU/RSV testing.  Fact Sheet for  Patients: EntrepreneurPulse.com.au  Fact Sheet for Healthcare Providers: IncredibleEmployment.be  This test is not yet approved or cleared by the [place] FDA and has been authorized for detection and/or diagnosis of SARS-CoV-2 by FDA under an Emergency Use Authorization (EUA). This EUA will remain in effect (meaning this test can be used) for the duration of the COVID-19 declaration under Section 564(b)(1) of the Act, 21 U.S.C. section 360bbb-3(b)(1), unless the authorization is terminated or revoked.     Resp Syncytial Virus by PCR NEGATIVE NEGATIVE    Comment: (NOTE) Fact Sheet for Patients: EntrepreneurPulse.com.au  Fact Sheet for Healthcare Providers: IncredibleEmployment.be  This test is not yet approved or cleared by the [place] FDA and has been authorized for detection and/or diagnosis of SARS-CoV-2 by FDA under an Emergency Use Authorization (EUA). This EUA will remain in effect (meaning this test can be used) for the duration of the COVID-19 declaration under Section 564(b)(1) of the Act, 21 U.S.C. section 360bbb-3(b)(1), unless the authorization is terminated  or revoked.  Performed at [facility], [place] [address] [place], [place] [address]     Current Facility-Administered Medications  Medication Dose Route Frequency Provider Last Rate Last Admin   buprenorphine (SUBUTEX) sublingual tablet 8 mg  8 mg Sublingual Daily Genevive Bi F, PA-C   8 mg at 05/25/22 1712   buPROPion ER (WELLBUTRIN SR) 12 hr tablet 100 mg  100 mg Oral BID Motley-Mangrum, Laurin Morgenstern A, PMHNP       diphenhydrAMINE (BENADRYL) capsule 50 mg  50 mg Oral Q6H PRN Motley-Mangrum, Haakon Titsworth A, PMHNP       [START ON 05/26/2022] haloperidol (HALDOL) tablet 1 mg  1 mg Oral Daily Motley-Mangrum, Henrik Orihuela A, PMHNP       hydrOXYzine (ATARAX) tablet 25 mg  25 mg Oral TID PRN Motley-Mangrum, Kess Mcilwain A, PMHNP        nicotine (NICODERM CQ - dosed in mg/24 hours) patch 21 mg  21 mg Transdermal Once Genevive Bi F, PA-C   21 mg at 05/25/22 1630   nicotine polacrilex (NICORETTE) gum 2 mg  2 mg Oral PRN Azucena Cecil, PA-C   2 mg at 05/25/22 1816   Current Outpatient Medications  Medication Sig Dispense Refill   buprenorphine (SUBUTEX) 8 MG SUBL SL tablet Place 24 mg under the tongue daily as needed.     buPROPion ER (WELLBUTRIN SR) 100 MG 12 hr tablet Take 1 tablet (100 mg total) by mouth 2 (two) times daily. 60 tablet 1   diphenhydrAMINE (BENADRYL) 50 MG tablet Take 2-4 tablets q6h as needed for restlessness related to Haldol 100 tablet 2   doxycycline (VIBRAMYCIN) 50 MG capsule Take 1 capsule (50 mg total) by mouth daily. 90 capsule 1   gabapentin (NEURONTIN) 400 MG capsule Take 1 capsule (400 mg total) by mouth 2 (two) times daily for 180 doses. 180 capsule 0   haloperidol (HALDOL) 1 MG tablet Take 1 tablet (1 mg total) by mouth daily. 90 tablet 0   hydrOXYzine (VISTARIL) 25 MG capsule Take 1 capsule (25 mg total) by mouth 3 (three) times daily as needed for anxiety. 90 capsule 2   modafinil (PROVIGIL) 200 MG tablet Take 3 tablets (600 mg total) by mouth daily. 90 tablet 2   polyethylene glycol (MIRALAX / GLYCOLAX) 17 g packet Take 17 g by mouth daily. 30 each 0   propranolol (INDERAL) 20 MG tablet Take 20 mg by mouth 3 (three) times daily.     tamsulosin (FLOMAX) 0.4 MG CAPS capsule Take by mouth.     thyroid (ARMOUR) 300 MG tablet Take 1/2 tablet daily 20 minutes before breakfast 15 tablet 2    Musculoskeletal: Strength & Muscle Tone: within normal limits Gait & Station: normal Patient leans: N/A   Psychiatric Specialty Exam: Presentation  General Appearance:  Bizarre; Disheveled  Eye Contact: Poor  Speech: Blocked  Speech Volume: Increased  Handedness: Right   Mood and Affect  Mood: Anxious; Irritable  Affect: Labile; Appropriate   Thought Process  Thought  Processes: Disorganized  Descriptions of Associations:Loose  Orientation:Partial  Thought Content:Illogical  History of Schizophrenia/Schizoaffective disorder:Yes  Duration of Psychotic Symptoms:Greater than six months  Hallucinations:Hallucinations: Auditory Description of Auditory Hallucinations: to kill his mother and commit crimes  Ideas of Reference:None  Suicidal Thoughts:Suicidal Thoughts: No  Homicidal Thoughts:Homicidal Thoughts: No   Sensorium  Memory: Immediate Fair  Judgment: Poor  Insight: Fair   Materials engineer: Fair  Attention Span: Fair  Recall: [employer] of Knowledge:  Fair  Language: Good   Psychomotor Activity  Psychomotor Activity: Psychomotor Activity: Normal   Assets  Assets: Communication Skills; Social Support; Housing    Sleep  Sleep: Sleep: Poor   Physical Exam: Physical Exam Vitals and nursing note reviewed.  HENT:     Nose: Nose normal.  Pulmonary:     Effort: Pulmonary effort is normal.  Musculoskeletal:        General: Normal range of motion.     Cervical back: Normal range of motion.  Neurological:     Mental Status: He is alert.    Review of Systems  Constitutional: Negative.   HENT: Negative.    Respiratory: Negative.    Musculoskeletal: Negative.   Psychiatric/Behavioral:  Positive for hallucinations.    Blood pressure (!) 141/111, pulse 88, temperature 98.4 F (36.9 C), resp. rate 18, SpO2 100 %. There is no height or weight on file to calculate BMI.  Medical Decision Making: Recommend inpatient psychiatric treatment.  Due to auditory hallucinations telling him to commit crimes and kill his mother.   Disposition: Recommend psychiatric Inpatient admission when medically cleared.  Michaele Offer, PMHNP 05/25/2022 7:19 PM

## 2022-05-25 NOTE — ED Notes (Signed)
Pt's dinner has arrived, pt sitting up and eating his dinner now

## 2022-05-25 NOTE — Progress Notes (Addendum)
Pt was accepted to [facility]  Wednesday 05/27/22; Bed Assignment [place]   Pt meets inpatient criteria per Michaele Offer, PMHNP   Attending Physician will be Dr. Mardelle Matte   Report can be called to:[phone]-Pager number   Pt can arrive after 9:00am  Care Team notified: Jeanie Sewer, RN, Michaele Offer, [place], MSW, [facility] 05/25/2022 11:31 PM

## 2022-05-25 NOTE — ED Provider Triage Note (Signed)
Emergency Medicine Provider Triage Evaluation Note  Paul Bray , a 57 y.o. male  was evaluated in triage.  Pt complains of auditory hallucinations.  Patient reports that for the last 1 week he has had voices in his head telling him to kill his mother.  The patient states the voices are also telling him they will commit him/accuse him of financial fraud.  Patient has history of schizophrenia, he is unable to relay to me if he is compliant on medication.  Patient denies any medical complaints.  First examination, IVC paperwork has been filled out.  Review of Systems  Positive:  Negative:   Physical Exam  BP (!) 141/111   Pulse 88   Temp 98.4 F (36.9 C)   Resp 18   SpO2 100%  Gen:   Awake, no distress   Resp:  Normal effort  MSK:   Moves extremities without difficulty  Other:    Medical Decision Making  Medically screening exam initiated at 2:29 PM.  Appropriate orders placed.  Paul Bray was informed that the remainder of the evaluation will be completed by another provider, this initial triage assessment does not replace that evaluation, and the importance of remaining in the ED until their evaluation is complete.     Azucena Cecil, PA-C 05/25/22 1432

## 2022-05-26 MED ORDER — GABAPENTIN 400 MG PO CAPS
400.0000 mg | ORAL_CAPSULE | Freq: Two times a day (BID) | ORAL | Status: DC
  Administered 2022-05-26 – 2022-05-27 (×3): 400 mg via ORAL
  Filled 2022-05-26 (×3): qty 1

## 2022-05-26 MED ORDER — TAMSULOSIN HCL 0.4 MG PO CAPS
0.8000 mg | ORAL_CAPSULE | Freq: Every day | ORAL | Status: DC
  Administered 2022-05-26: 0.8 mg via ORAL
  Filled 2022-05-26 (×3): qty 2

## 2022-05-26 MED ORDER — ACETAMINOPHEN 325 MG PO TABS
ORAL_TABLET | ORAL | Status: AC
  Filled 2022-05-26: qty 2

## 2022-05-26 MED ORDER — BUPRENORPHINE HCL 8 MG SL SUBL
8.0000 mg | SUBLINGUAL_TABLET | Freq: Three times a day (TID) | SUBLINGUAL | Status: DC
  Administered 2022-05-26 – 2022-05-27 (×5): 8 mg via SUBLINGUAL
  Filled 2022-05-26 (×5): qty 1

## 2022-05-26 NOTE — ED Notes (Signed)
Pt was accepted to [facility] Wednesday 05/27/22; Bed Assignment Time   Pt meets inpatient criteria per Michaele Offer, PMHNP   Attending Physician will be Dr. Mardelle Matte   Report can be called to:[phone]-Pager number   Pt can arrive after 9:00am  Care Team notified: Jeanie Sewer, RN, Michaele Offer, PMHNP   CM It is Wednesday accepting day because they are full tomorrow

## 2022-05-26 NOTE — ED Provider Notes (Signed)
Emergency Medicine Observation Re-evaluation Note  Paul Bray is a 57 y.o. male, seen on rounds today.  Pt initially presented to the ED for complaints of Hearing Voices Currently, the patient is no acute distress.  Physical Exam  BP (!) 123/92 (BP Location: Left Arm)   Pulse 93   Temp 97.9 F (36.6 C) (Oral)   Resp 19   SpO2 93%  Physical Exam   ED Course / MDM  EKG:EKG Interpretation  Date/Time:  Monday May 25 2022 14:34:40 EST Ventricular Rate:  80 PR Interval:  178 QRS Duration: 83 QT Interval:  388 QTC Calculation: 448 R Axis:   17 Text Interpretation: Interpretation limited secondary to artifact Sinus rhythm ST elev, probable normal early repol pattern Baseline wander in lead(s) II aVR aVF V1 Confirmed by Margaretmary Eddy [phone]) on 05/25/2022 6:17:50 PM  I have reviewed the labs performed to date as well as medications administered while in observation.  Recent changes in the last 24 hours include none.  Plan  Current plan is for placement tomorrow.    Lacretia Leigh, MD 05/26/22 [phone]

## 2022-05-26 NOTE — ED Notes (Signed)
Mr. Mans is a very angry person at this time because he has not received his subutex  when he wanted it at 2200. It was explained to him that I do not have an order for a third dose and I attempted to contact the ER provider he was entubating  a pt in the res room A. I informed Mr Dunlap that the doctor was handling an emergent pt he stated " I'm as important as that guy why do I have to wait" .  Pt was displaying an angry affect and a very labile and angry mood. He then accused staff of miss treating pt and that he had never been to a hospital as disorganized as [facility]. The Administrative Coordinator was asked to his bedside where his questions were answered  but he remain dissatisfied and bitter continuing to make negative comment under his breath about staff and the facility.

## 2022-05-27 MED ORDER — LORAZEPAM 1 MG PO TABS
1.0000 mg | ORAL_TABLET | Freq: Once | ORAL | Status: AC
  Administered 2022-05-27: 1 mg via ORAL
  Filled 2022-05-27: qty 1

## 2022-05-27 NOTE — ED Notes (Signed)
Patient refused to eat his lunch

## 2022-05-27 NOTE — ED Provider Notes (Signed)
Emergency Medicine Observation Re-evaluation Note  Paul Bray is a 57 y.o. male, seen on rounds today.  Pt initially presented to the ED for complaints of Hearing Voices Currently, the patient is resting.  Physical Exam  BP 105/65 (BP Location: Left Arm)   Pulse 74   Temp 97.7 F (36.5 C) (Oral)   Resp 16   SpO2 94%  Physical Exam General: no acute distress Lungs: normal effort Psych: upset/agitated  ED Course / MDM  EKG:EKG Interpretation  Date/Time:  Monday May 25 2022 14:34:40 EST Ventricular Rate:  80 PR Interval:  178 QRS Duration: 83 QT Interval:  388 QTC Calculation: 448 R Axis:   17 Text Interpretation: Interpretation limited secondary to artifact Sinus rhythm ST elev, probable normal early repol pattern Baseline wander in lead(s) II aVR aVF V1 Confirmed by Margaretmary Eddy [phone]) on 05/25/2022 6:17:50 PM  I have reviewed the labs performed to date as well as medications administered while in observation.  Recent changes in the last 24 hours include multiple meds, such as ativan, buprenorphine.  Plan  Current plan is for transfer/admission to [facility] Dr Juliann Mule is accepting physician.    Sherwood Gambler, MD 05/27/22 [phone]

## 2022-05-27 NOTE — ED Notes (Signed)
Patient awake breakfast giving pt refused to eat at this time pt is using the phone again

## 2022-05-27 NOTE — ED Notes (Signed)
Paul Bray is lying quietly in his room occasionally coming to the door of his room to observe staff. When questioned if he needed anything he replied no. He was informed that his buspar would be a little late because the tube delivery system was being repaired. He then informed me that he had not receive his Flomax which was due at 1800. I send communications to pharmacy to obtain the remainder of his medications.

## 2022-05-27 NOTE — ED Notes (Signed)
Patient off unit to [facility] per provider. Patient alert, cooperative, no s/s of distress at this time. Patient discharge information and belongings given to [facility] for transportation. Patient ambulatory off unit, escorted and transported by [facility].

## 2022-05-27 NOTE — ED Notes (Signed)
Currently sleeping no signs of discomfort or distress.

## 2022-05-28 ENCOUNTER — Encounter (HOSPITAL_COMMUNITY): Payer: Self-pay | Admitting: Medical

## 2022-05-28 ENCOUNTER — Encounter (HOSPITAL_COMMUNITY): Payer: Self-pay

## 2022-05-28 ENCOUNTER — Telehealth (HOSPITAL_COMMUNITY): Payer: Medicare Other | Admitting: Medical

## 2022-05-28 NOTE — Progress Notes (Signed)
Patient ID: Paul Bray, male   DOB: [birth year], 57 y.o.   MRN: [id number]  Pt was scheduled for FU today at 3pm  . ,Date of Service: 05/27/2022  2:27 PM   Signed      Patient off unit to [facility] per provider. Patient alert, cooperative, no s/s of distress at this time. Patient discharge information and belongings given to [facility] for transportation. Patient ambulatory off unit, escorted and transported by [facility].          Unfortunately his addictive behaviors remained quite strong despite Suboxone MAT.As a result he engaged in an old behavior of self medicating with his mood altering prescriptions-going into his mother's room and helping himself to 8 Suboxone and 6 Modafinil(his meds are in dose packs).   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  He had significant TDK to Haldol at higher doses unresponsive to new VMAT2 Inhibitors but managed with Benadryl until taper alleviated symptoms. From there he was tapered to 1 Mg daily dose with no plan to stop medication for fear of relapse.His overdosing on Suboxone with Modafanil  did that. He refused to get involved with recovery groups in person and/or virtually so he basically has no effective defense against using at this point.   Date of Service: 05/27/2022  2:27 PM   Signed      Patient off unit to [facility] per provider. Patient alert, cooperative, no s/s of distress at this time. Patient discharge information and belongings given to [facility] for transportation. Patient ambulatory off unit, escorted and transported by [facility].

## 2022-06-11 ENCOUNTER — Encounter (HOSPITAL_COMMUNITY): Payer: Self-pay | Admitting: Medical

## 2022-06-11 ENCOUNTER — Ambulatory Visit (HOSPITAL_BASED_OUTPATIENT_CLINIC_OR_DEPARTMENT_OTHER): Payer: Medicare Other | Admitting: Medical

## 2022-06-11 DIAGNOSIS — Z91148 Patient's other noncompliance with medication regimen for other reason: Secondary | ICD-10-CM

## 2022-06-11 DIAGNOSIS — F1921 Other psychoactive substance dependence, in remission: Secondary | ICD-10-CM

## 2022-06-11 DIAGNOSIS — F060 Psychotic disorder with hallucinations due to known physiological condition: Secondary | ICD-10-CM

## 2022-06-11 DIAGNOSIS — L719 Rosacea, unspecified: Secondary | ICD-10-CM

## 2022-06-11 DIAGNOSIS — G250 Essential tremor: Secondary | ICD-10-CM

## 2022-06-11 DIAGNOSIS — I639 Cerebral infarction, unspecified: Secondary | ICD-10-CM | POA: Diagnosis not present

## 2022-06-11 DIAGNOSIS — I6381 Other cerebral infarction due to occlusion or stenosis of small artery: Secondary | ICD-10-CM

## 2022-06-11 DIAGNOSIS — G4730 Sleep apnea, unspecified: Secondary | ICD-10-CM

## 2022-06-11 DIAGNOSIS — Z923 Personal history of irradiation: Secondary | ICD-10-CM

## 2022-06-11 DIAGNOSIS — F1920 Other psychoactive substance dependence, uncomplicated: Secondary | ICD-10-CM | POA: Diagnosis not present

## 2022-06-11 DIAGNOSIS — E039 Hypothyroidism, unspecified: Secondary | ICD-10-CM

## 2022-06-11 DIAGNOSIS — N401 Enlarged prostate with lower urinary tract symptoms: Secondary | ICD-10-CM

## 2022-06-11 DIAGNOSIS — D320 Benign neoplasm of cerebral meninges: Secondary | ICD-10-CM

## 2022-06-11 DIAGNOSIS — F1910 Other psychoactive substance abuse, uncomplicated: Secondary | ICD-10-CM

## 2022-06-11 DIAGNOSIS — I10 Essential (primary) hypertension: Secondary | ICD-10-CM

## 2022-06-11 DIAGNOSIS — G4710 Hypersomnia, unspecified: Secondary | ICD-10-CM

## 2022-06-11 MED ORDER — HALOPERIDOL 10 MG PO TABS: 10.0000 mg | ORAL_TABLET | Freq: Every day | ORAL | 0 refills | Status: DC

## 2022-06-11 MED ORDER — LEVOTHYROXINE SODIUM 150 MCG PO TABS: 150.0000 ug | ORAL_TABLET | Freq: Every day | ORAL | 11 refills | Status: DC

## 2022-06-11 MED ORDER — GABAPENTIN 400 MG PO CAPS: 400.0000 mg | ORAL_CAPSULE | Freq: Two times a day (BID) | ORAL | 0 refills | Status: DC

## 2022-06-11 MED ORDER — DIPHENHYDRAMINE HCL 50 MG PO TABS: ORAL_TABLET | ORAL | 2 refills | Status: AC

## 2022-06-11 NOTE — Progress Notes (Incomplete Revision)
BH MD/PA/NP OP Progress Note  06/11/2022 4:45 PM [facility] [place]  MRN:  [id number]  Chief Complaint:  Chief Complaint  Patient presents with   Altered Mental Status   Follow-up   Addiction Problem   HPI:  Visit Diagnosis:    ICD-10-CM   1. Polysubstance dependence ([place])  F19.20     2. Noncompliance with medication treatment due to abuse of medication  Z91.148     3. Drug abuse, IV ([place])  F19.10     4. Multiple lacunar infarcts (HCC)  I63.81     5. Infarction of left basal ganglia (HCC)  I63.9     6. 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 Status post gamma knife treatment  Z92.3     8. Hypersomnia with sleep apnea  G47.10    G47.30     9. Acquired hypothyroidism  E03.9     10. Familial tremor  G25.0     11. Rosacea  L71.9     12. Essential hypertension  I10     13. Benign prostatic hyperplasia with lower urinary tract symptoms, symptom details unspecified  N40.1     14. Polysubstance dependence in early, early partial, sustained full, or sustained partial remission (HCC)  F19.21     15. Meningioma, cerebral (HCC)  D32.0       Past Psychiatric History:   Past Medical History:  Past Medical History:  Diagnosis Date   ADHD    Anxiety    Brain tumor ([place])    balance and occ memory issues and headaches   Brain tumor ([place])    sees [facility] q year   Depression    Headache    migraine and tension   Hypothyroidism    Soft tissue mass    left shoulder   Substance abuse ([place])     Past Surgical History:  Procedure Laterality Date   colonscopy     gamma knife radiation 2018 for brain tumor'     hematoma removed from arm Left    MASS EXCISION Left 06/30/2019   Procedure: EXCISION OF SOFT TISSUE  MASS LEFT SHOULDER;  Surgeon: Armandina Gemma, MD;  Location: [place];  Service: General;  Laterality: Left;  LMA    Family Psychiatric History:   Family History: No family history on file.  Social  History:  Social History   Socioeconomic History   Marital status: Single    Spouse name: Not on file   Number of children: Not on file   Years of education: Not on file   Highest education level: Not on file  Occupational History   Not on file  Tobacco Use   Smoking status: Former    Types: Cigarettes   Smokeless tobacco: Current   Tobacco comments:    quit jan 2020  Vaping Use   Vaping Use: Every day  Substance and Sexual Activity   Alcohol use: Not Currently    Comment: quit Oct 2019   Drug use: Not Currently    Types: Cocaine, IV, Heroin, MDMA (Ecstacy)    Comment: last reported use; cocaine and heroin in 2019; MDMA Feb 2023   Sexual activity: Not Currently  Other Topics Concern   Not on file  Social History Narrative   Not on file   Social Determinants of Health   Financial Resource Strain: Not on file  Food Insecurity: Not on file  Transportation Needs: Not on file  Physical Activity: Not on file  Stress: Not on file  Social Connections: Not on file    Allergies:  Allergies  Allergen Reactions   Ingrezza [Valbenazine Tosylate] Other (See Comments)    Severe tongue chewing at 80 mg Tolerates lower doses   Naloxone Palpitations    Pt report MAT Rx is Subutex/Buprenorphine only   Amphetamine-Dextroamphetamine Other (See Comments)    Metabolic Disorder Labs: Lab Results  Component Value Date   HGBA1C 5.0 10/03/2021   MPG 96.8 10/03/2021   No results found for: "PROLACTIN" Lab Results  Component Value Date   CHOL 191 10/03/2021   TRIG 92 10/03/2021   HDL 48 10/03/2021   CHOLHDL 4.0 10/03/2021   VLDL 18 10/03/2021   LDLCALC 125 (H) 10/03/2021   Lab Results  Component Value Date   TSH 1.500 02/04/2022   TSH 0.417 (L) 11/10/2021    Therapeutic Level Labs: No results found for: "LITHIUM" No results found for: "VALPROATE" No results found for: "CBMZ"  Current Medications: Current Outpatient Medications  Medication Sig Dispense Refill    levothyroxine (SYNTHROID) 150 MCG tablet Take 1 tablet (150 mcg total) by mouth daily. 30 tablet 11   tamsulosin (FLOMAX) 0.4 MG CAPS capsule Take by mouth.     buprenorphine (SUBUTEX) 8 MG SUBL SL tablet Place 8 mg under the tongue 3 (three) times daily.     diphenhydrAMINE (BENADRYL) 50 MG tablet Take 2-4 tablets q6h as needed for restlessness related to Haldol 100 tablet 2   doxycycline (VIBRAMYCIN) 50 MG capsule Take 1 capsule (50 mg total) by mouth daily. 90 capsule 1   gabapentin (NEURONTIN) 400 MG capsule Take 1 capsule (400 mg total) by mouth 2 (two) times daily for 180 doses. 180 capsule 0   haloperidol (HALDOL) 10 MG tablet Take 1 tablet (10 mg total) by mouth daily. 90 tablet 0   nicotine (NICODERM CQ - DOSED IN MG/24 HOURS) 21 mg/24hr patch Place 21 mg onto the skin daily.     propranolol (INDERAL) 20 MG tablet Take 20 mg by mouth 3 (three) times daily.     No current facility-administered medications for this visit.     Musculoskeletal: Strength & Muscle Tone: within normal limits Gait & Station: normal Patient leans: N/A  Psychiatric Specialty Exam: Review of Systems  Constitutional: Negative.   HENT: Negative.    Eyes: Negative.   Respiratory: Negative.  Negative for apnea, cough, choking, chest tightness, shortness of breath, wheezing and stridor.        Vapes  Cardiovascular: Negative.   Gastrointestinal: Negative.   Endocrine: Negative.        HypoThyroid   Genitourinary:  Negative for decreased urine volume, difficulty urinating, dysuria, enuresis, flank pain, frequency, genital sores, hematuria, penile discharge, penile pain, penile swelling, scrotal swelling, testicular pain and urgency.       BPH  Musculoskeletal: Negative.   Skin:  Positive for color change, pallor, rash (Depakote?) and wound.  Allergic/Immunologic: Negative.   Neurological:  Positive for tremors. Negative for dizziness, seizures, syncope, facial asymmetry, speech difficulty, weakness,  light-headedness, numbness and headaches.  Hematological:  Negative for adenopathy. Does not bruise/bleed easily.  Psychiatric/Behavioral:  Positive for agitation, dysphoric mood, hallucinations (Auditory), sleep disturbance (Chronic) and suicidal ideas. Negative for behavioral problems, confusion, decreased concentration and self-injury. The patient is nervous/anxious. The patient is not hyperactive.   Musculoskeletal: Strength & Muscle Tone: {desc; muscle tone:32375} Gait & Station: {PE GAIT ED EF:[id number] Patient leans: {Patient Leans:21022755}  Psychiatric Specialty Exam:  Review of Systems  There were no vitals taken for this visit.There is no height or weight on file to calculate BMI.  General Appearance: {Appearance:22683}  Eye Contact:  {BHH EYE CONTACT:22684}  Speech:  {Speech:22685}  Volume:  {Volume (PAA):22686}  Mood:  {BHH MOOD:22306}  Affect:  {Affect (PAA):22687}  Thought Process:  {Thought Process (PAA):22688}  Orientation:  {BHH ORIENTATION (PAA):22689}  Thought Content: {Thought Content:22690}   Suicidal Thoughts:  {ST/HT (PAA):22692}  Homicidal Thoughts:  {ST/HT (PAA):22692}  Memory:  {BHH MEMORY:22881}  Judgement:  {Judgement (PAA):22694}  Insight:  {Insight (PAA):22695}  Psychomotor Activity:  {Psychomotor (PAA):22696}  Concentration:  {Concentration:21399}  Recall:  {BHH GOOD/FAIR/POOR:22877}  Fund of Knowledge: {BHH GOOD/FAIR/POOR:22877}  Language: {BHH GOOD/FAIR/POOR:22877}  Akathisia:  {BHH YES OR NO:22294}  Handed:  {Handed:22697}  AIMS (if indicated): {Desc; done/not:10129}  Assets:  {Assets (PAA):22698}  ADL's:  {BHH XO:4411959  Cognition: {chl bhh cognition:304700322}  Sleep:  {BHH GOOD/FAIR/POOR:22877}      Assessment and Plan:     [name], PA-C 06/11/2022, 4:45 PM

## 2022-06-11 NOTE — Progress Notes (Addendum)
BH MD/PA/NP OP Progress Note  06/11/2022 4:45 PM Paul Bray  MRN:  [id number]  Chief Complaint:  Chief Complaint  Patient presents with   Altered Mental Status   Follow-up   Addiction Problem   HPI: Paul Bray returns for scheduled FU having recently been discharged from [facility] on [address].He relapsed into psychosis after a period of symptom free on Haldolc precipitated by his abuse of his Suboxone and Modafanil.He reported voices telling him they are going to make him disappearand wanting reassurance we will look for him when this happens. At last intake it was reported he had said he wanted to kill his mother.This was an entirely new report. He is not much better today despite some increase in his Haldol. Mom says he will not take full dose. Visit Diagnosis:    ICD-10-CM   1. Polysubstance dependence ([place])  F19.20     2. Noncompliance with medication treatment due to abuse of medication  Z91.148     3. Drug abuse, IV ([place])  F19.10     4. Multiple lacunar infarcts (HCC)  I63.81     5. Infarction of left basal ganglia (HCC)  I63.9     6. 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 Status post gamma knife treatment  Z92.3     8. Hypersomnia with sleep apnea  G47.10    G47.30     9. Acquired hypothyroidism  E03.9     10. Familial tremor  G25.0     11. Rosacea  L71.9     12. Essential hypertension  I10     13. Benign prostatic hyperplasia with lower urinary tract symptoms, symptom details unspecified  N40.1     14. Polysubstance dependence in early, early partial, sustained full, or sustained partial remission (HCC)  F19.21     15. Meningioma, cerebral ([place])  D32.0       Past Psychiatric History:  Care Everywhere reviewed-No New Info 03/26/2022 but old records from [facility] are now present and copied below for his record [place] Date of recent admission: 10/29/21 Date of recent discharge:  11/01/21 [facility] [place] [facility] [place] 6/17/-03/04/2020  Pt with Opiate dependence MAT Suboxone at [facility] Lynnda Shields C/O of paranoia he is aware of but cant stop/completely control.Has rx for Risperdal from [facility]. Also hx of Gamma Knife tradiation fo benign brain tumor ?2018 Says has records Requesting Psychiatrist/medication mangement for paranoia hx [place] [facility] [place] 08/21/2021 - NOW [facility] INPATIENT  Date of Admission:  10/02/2021 Date of Discharge: 10/13/2021   [place] Alcohol intoxication 03/26/2017  Alcohol withdrawal delirium 03/26/2017  Chronic daily headache 02/25/2017  Alcohol use disorder, severe, dependence 02/22/2017  Heroin dependence MAT Methadone [place]/Subutex TBR 01/2018  Anxiety disorder, unspecified 01/20/2017  Paranoia 03/2018    [facility] Unk Pinto, NP - 04/12/2017 12:38 PM EST Formatting of this note might be different from the original. [facility] Psychiatry - Substance Abuse PHP/IOP Intake Assessment  BH Formulate an individual and appropriate relapse prevention plan.   [place] SU Problem: Formation of a relapse prevention plan    [facility] Uncomplicated opioid dependence (CMS/HCC) 12/24/2017  1. Discharge from hospital. 2. Follow up at [facility]   Past Medical History:  Care Everywhere reviewed-No New Info  Past Medical History:  Diagnosis Date   ADHD    Anxiety    Brain tumor ([place])    balance  and occ memory issues and headaches   Brain tumor ([place])    sees [facility] q year   Depression    Headache    migraine and tension   Hypothyroidism    Soft tissue mass    left shoulder   Substance abuse ([place])     Past Surgical History:  Procedure Laterality Date   colonscopy     gamma knife radiation 2018 for brain tumor'     hematoma removed from arm Left    MASS EXCISION Left 06/30/2019   Procedure: EXCISION OF SOFT TISSUE  MASS LEFT SHOULDER;  Surgeon: Armandina Gemma, MD;  Location:  [place];  Service: General;  Laterality: Left;  LMA    Family Psychiatric History:  Alcoholism and drug abuse on father's side     Family History:   Hypertension Mother   Hypertension/Alcoholism/Addiction Father   Social History:  Social History   ocioeconomic History   Marital status: Single      Spouse name: Not on file   Number of children: NA   Years of education: Not on file   Highest education level: Not on file  Occupational History   Dance movement psychotherapist until 2016  Tobacco Use   Smoking status: Former      Types: Cigarettes   Smokeless tobacco: Current Vape   Tobacco comments:      quit cigarettes jan 2020  Vaping Use   Vaping Use: Every day  Substance and Sexual Activity   Alcohol use: Not Currently      Comment: quit Oct 2019   Drug use: Not Currently      Types: Cocaine, IV, Heroin, MDMA (Ecstacy)      Comment: last reported use; cocaine and heroin in 2019; MDMA Feb 2023   Sexual activity: Not Currently  Other Topics Concern   Not on file  Social History Narrative    Living with mother now      Social Determinants of Health     Difficulty of Paying Living Expenses: Disability  Food Insecurity:    Worried About Charity fundraiser in the Last Year: On disability   Arboriculturist in the Last Year:   Transportation Needs:    Film/video editor (Medical): Uses Ecologist (Non-Medical):   Physical Activity:    Days of Exercise per Week:    Minutes of Exercise per Session:   Stress:    Feeling of Stress : Constant   Social Connections:    Frequency of Communication with Friends and Family: Mother recently kicked him out of house   Frequency of Social Gatherings with Friends and Family: Lives alone-   Attends Religious Services: No   Active Member of Clubs or Organizations: Not active with AA /NA   Attends Club or Organizati    Allergies:  Allergies  Allergen Reactions   Ingrezza [Valbenazine  Tosylate] Other (See Comments)    Severe tongue chewing at 80 mg Tolerates lower doses   Naloxone Palpitations    Pt report MAT Rx is Subutex/Buprenorphine only   Amphetamine-Dextroamphetamine Other (See Comments)    Metabolic Disorder Labs: Lab Results  Component Value Date   HGBA1C 5.0 10/03/2021   MPG 96.8 10/03/2021   No results found for: "PROLACTIN" Lab Results  Component Value Date   CHOL 191 10/03/2021   TRIG 92 10/03/2021   HDL 48 10/03/2021   CHOLHDL 4.0 10/03/2021   VLDL 18 10/03/2021   LDLCALC  125 (H) 10/03/2021   Lab Results  Component Value Date   TSH 1.500 02/04/2022   TSH 0.417 (L) 11/10/2021    Therapeutic Level Labs:NA  Current Medications: Current Outpatient Medications  Medication Sig Dispense Refill   levothyroxine (SYNTHROID) 150 MCG tablet Take 1 tablet (150 mcg total) by mouth daily. 30 tablet 11   tamsulosin (FLOMAX) 0.4 MG CAPS capsule Take by mouth.     buprenorphine (SUBUTEX) 8 MG SUBL SL tablet Place 8 mg under the tongue 3 (three) times daily.     diphenhydrAMINE (BENADRYL) 50 MG tablet Take 2-4 tablets q6h as needed for restlessness related to Haldol 100 tablet 2   doxycycline (VIBRAMYCIN) 50 MG capsule Take 1 capsule (50 mg total) by mouth daily. 90 capsule 1   gabapentin (NEURONTIN) 400 MG capsule Take 1 capsule (400 mg total) by mouth 2 (two) times daily for 180 doses. 180 capsule 0   haloperidol (HALDOL) 10 MG tablet Take 1 tablet (10 mg total) by mouth daily. 90 tablet 0   nicotine (NICODERM CQ - DOSED IN MG/24 HOURS) 21 mg/24hr patch Place 21 mg onto the skin daily.     propranolol (INDERAL) 20 MG tablet Take 20 mg by mouth 3 (three) times daily.     No current facility-administered medications for this visit.    Musculoskeletal: Strength & Muscle Tone: Rapid leg movements stoppable with conscious effort Gait & Station: normal Patient leans: Front   Psychiatric Specialty Exam: Review of Systems  Constitutional: Negative.    HENT: Negative.    Eyes: Negative.   Respiratory: Negative.  Negative for apnea, cough, choking, chest tightness, shortness of breath, wheezing and stridor.        Vapes  Cardiovascular: Negative.   Gastrointestinal: Negative.   Endocrine: Negative.        HypoThyroid   Genitourinary:  Negative for decreased urine volume, difficulty urinating, dysuria, enuresis, flank pain, frequency, genital sores, hematuria, penile discharge, penile pain, penile swelling, scrotal swelling, testicular pain and urgency.       BPH  Musculoskeletal: Negative.   Skin:  Positive for color change, pallor, rash (Depakote?) and wound.  Allergic/Immunologic: Negative.   Neurological:  Positive for tremors. Negative for dizziness, seizures, syncope, facial asymmetry, speech difficulty, weakness, light-headedness, numbness and headaches.  Hematological:  Negative for adenopathy. Does not bruise/bleed easily.  Psychiatric/Behavioral:  Positive for agitation, dysphoric mood, hallucinations (Auditory), sleep disturbance (Chronic) and suicidal ideas. Negative for behavioral problems, confusion, decreased concentration and self-injury. The patient is nervous/anxious. The patient is not hyperactive.    There were no vitals taken for this visit.There is no height or weight on file to calculate BMI.  General Appearance: Casual and Fairly Groomed  Eye Contact:  Good  Speech:  Clear and Coherent and Normal Rate  Volume:  Normal  Mood:  Anxious and Dysphoric  Affect:  Congruent  Thought Process:  Disorganized and Descriptions of Associations: Loose  Orientation:  Full (Time, Place, and Person)  Thought Content: Delusions and Hallucinations: Auditory   Suicidal Thoughts:  No  Homicidal Thoughts:  No  Memory:  Damaged  Judgement:  Impaired  Insight:  Lacking  Psychomotor Activity:  Restlessness  Concentration:  Concentration: Poor and Attention Span: Poor  Recall:  Poor  Fund of Knowledge: WDL  Language: Fair   Akathisia:  Negative  Handed:  Right  AIMS (if indicated): not done  Assets:  Desire for Improvement Financial Resources/Insurance Housing Social Support Talents/Skills Transportation Vocational/Educational  ADL's:  Impaired  Cognition:  Impaired,  Severe  Sleep:  Poor      Assessment :Ongoing Hallucinosis Polysubstance addictions Meningioma Lt Basal Ganglia strokes with probable psychotic and sleep/energy sequellae    and Plan: Increase Haldol to 10 mg prefers to take HS Continue other meds. FU 1 week      [name], PA-C 06/11/2022, 4:45 PM

## 2022-06-13 ENCOUNTER — Encounter (HOSPITAL_COMMUNITY): Payer: Self-pay

## 2022-06-13 ENCOUNTER — Emergency Department (HOSPITAL_COMMUNITY)
Admission: EM | Admit: 2022-06-13 | Discharge: 2022-06-17 | Disposition: A | Discharge status: Home / Self Care | Payer: Medicare Other | Attending: Emergency Medicine | Admitting: Emergency Medicine

## 2022-06-13 DIAGNOSIS — R339 Retention of urine, unspecified: Secondary | ICD-10-CM | POA: Diagnosis not present

## 2022-06-13 DIAGNOSIS — F1994 Other psychoactive substance use, unspecified with psychoactive substance-induced mood disorder: Secondary | ICD-10-CM | POA: Diagnosis present

## 2022-06-13 DIAGNOSIS — F1121 Opioid dependence, in remission: Secondary | ICD-10-CM | POA: Insufficient documentation

## 2022-06-13 DIAGNOSIS — Z1152 Encounter for screening for COVID-19: Secondary | ICD-10-CM | POA: Diagnosis not present

## 2022-06-13 DIAGNOSIS — R251 Tremor, unspecified: Secondary | ICD-10-CM | POA: Insufficient documentation

## 2022-06-13 DIAGNOSIS — Z79899 Other long term (current) drug therapy: Secondary | ICD-10-CM | POA: Insufficient documentation

## 2022-06-13 DIAGNOSIS — F203 Undifferentiated schizophrenia: Secondary | ICD-10-CM | POA: Insufficient documentation

## 2022-06-13 DIAGNOSIS — R440 Auditory hallucinations: Secondary | ICD-10-CM

## 2022-06-13 DIAGNOSIS — F1120 Opioid dependence, uncomplicated: Secondary | ICD-10-CM | POA: Diagnosis present

## 2022-06-13 LAB — CBC WITH DIFFERENTIAL/PLATELET
Abs Immature Granulocytes: 0.02 10*3/uL (ref 0.00–0.07)
Basophils Absolute: 0 10*3/uL (ref 0.0–0.1)
Basophils Relative: 0 %
Eosinophils Absolute: 0 10*3/uL (ref 0.0–0.5)
Eosinophils Relative: 0 %
HCT: 40.7 % (ref 39.0–52.0)
Hemoglobin: 14 g/dL (ref 13.0–17.0)
Immature Granulocytes: 1 %
Lymphocytes Relative: 19 %
Lymphs Abs: 0.7 10*3/uL (ref 0.7–4.0)
MCH: 31.5 pg (ref 26.0–34.0)
MCHC: 34.4 g/dL (ref 30.0–36.0)
MCV: 91.5 fL (ref 80.0–100.0)
Monocytes Absolute: 0.5 10*3/uL (ref 0.1–1.0)
Monocytes Relative: 12 %
Neutro Abs: 2.7 10*3/uL (ref 1.7–7.7)
Neutrophils Relative %: 68 %
Platelets: 186 10*3/uL (ref 150–400)
RBC: 4.45 MIL/uL (ref 4.22–5.81)
RDW: 13.4 % (ref 11.5–15.5)
WBC: 3.9 10*3/uL — ABNORMAL LOW (ref 4.0–10.5)
nRBC: 0 % (ref 0.0–0.2)

## 2022-06-13 LAB — COMPREHENSIVE METABOLIC PANEL
ALT: 20 U/L (ref 0–44)
AST: 25 U/L (ref 15–41)
Albumin: 4.4 g/dL (ref 3.5–5.0)
Alkaline Phosphatase: 63 U/L (ref 38–126)
Anion gap: 13 (ref 5–15)
BUN: 11 mg/dL (ref 6–20)
CO2: 23 mmol/L (ref 22–32)
Calcium: 9 mg/dL (ref 8.9–10.3)
Chloride: 101 mmol/L (ref 98–111)
Creatinine, Ser: 1.26 mg/dL — ABNORMAL HIGH (ref 0.61–1.24)
GFR, Estimated: >60 mL/min (ref >60)
Glucose, Bld: 101 mg/dL — ABNORMAL HIGH (ref 70–99)
Potassium: 3.8 mmol/L (ref 3.5–5.1)
Sodium: 137 mmol/L (ref 135–145)
Total Bilirubin: 0.6 mg/dL (ref 0.3–1.2)
Total Protein: 7.3 g/dL (ref 6.5–8.1)

## 2022-06-13 LAB — RESP PANEL BY RT-PCR (RSV, FLU A&B, COVID)  RVPGX2
Influenza A by PCR: NEGATIVE
Influenza B by PCR: NEGATIVE
Resp Syncytial Virus by PCR: NEGATIVE
SARS Coronavirus 2 by RT PCR: NEGATIVE

## 2022-06-13 LAB — RAPID URINE DRUG SCREEN, HOSP PERFORMED
Amphetamines: NOT DETECTED
Barbiturates: NOT DETECTED
Benzodiazepines: NOT DETECTED
Cocaine: NOT DETECTED
Opiates: NOT DETECTED
Tetrahydrocannabinol: NOT DETECTED

## 2022-06-13 LAB — ETHANOL: Alcohol, Ethyl (B): <10 mg/dL (ref <10)

## 2022-06-13 LAB — ACETAMINOPHEN LEVEL: Acetaminophen (Tylenol), Serum: <10 ug/mL — ABNORMAL LOW (ref 10–30)

## 2022-06-13 LAB — SALICYLATE LEVEL: Salicylate Lvl: <7 mg/dL — ABNORMAL LOW (ref 7.0–30.0)

## 2022-06-13 MED ORDER — BUPRENORPHINE HCL 8 MG SL SUBL
8.0000 mg | SUBLINGUAL_TABLET | Freq: Three times a day (TID) | SUBLINGUAL | Status: DC
  Administered 2022-06-13 – 2022-06-17 (×12): 8 mg via SUBLINGUAL
  Filled 2022-06-13 (×12): qty 1

## 2022-06-13 MED ORDER — HALOPERIDOL 5 MG PO TABS
10.0000 mg | ORAL_TABLET | Freq: Every day | ORAL | Status: DC
  Administered 2022-06-13 – 2022-06-16 (×4): 10 mg via ORAL
  Filled 2022-06-13 (×4): qty 2

## 2022-06-13 MED ORDER — HYDROXYZINE HCL 25 MG PO TABS
25.0000 mg | ORAL_TABLET | Freq: Three times a day (TID) | ORAL | Status: DC | PRN
  Administered 2022-06-13 – 2022-06-16 (×3): 25 mg via ORAL
  Filled 2022-06-13 (×3): qty 1

## 2022-06-13 MED ORDER — BENZTROPINE MESYLATE 1 MG PO TABS
1.0000 mg | ORAL_TABLET | Freq: Every day | ORAL | Status: DC
  Administered 2022-06-13 – 2022-06-16 (×4): 1 mg via ORAL
  Filled 2022-06-13 (×4): qty 1

## 2022-06-13 MED ORDER — NICOTINE 21 MG/24HR TD PT24
21.0000 mg | MEDICATED_PATCH | Freq: Once | TRANSDERMAL | Status: AC
  Administered 2022-06-13: 21 mg via TRANSDERMAL
  Filled 2022-06-13: qty 1

## 2022-06-13 MED ORDER — GABAPENTIN 400 MG PO CAPS
400.0000 mg | ORAL_CAPSULE | Freq: Two times a day (BID) | ORAL | Status: DC
  Administered 2022-06-13 – 2022-06-17 (×9): 400 mg via ORAL
  Filled 2022-06-13 (×9): qty 1

## 2022-06-13 MED ORDER — LORAZEPAM 1 MG PO TABS
1.0000 mg | ORAL_TABLET | Freq: Once | ORAL | Status: AC
  Administered 2022-06-13: 1 mg via ORAL
  Filled 2022-06-13: qty 1

## 2022-06-13 NOTE — ED Triage Notes (Signed)
Pt arrived via POV, with family. States hearing voices, states has been hearing voices, was at [facility] and discharged before felt ready.

## 2022-06-13 NOTE — ED Notes (Signed)
Resting, calm, intermittently sleeping/ interactive, NAD. Up to b/r for urine sample, steady gait.

## 2022-06-13 NOTE — ED Notes (Signed)
Pt belongings have been move to 48-22. Pt changed out into purple scrubs.

## 2022-06-13 NOTE — ED Provider Notes (Signed)
[place] EMERGENCY DEPARTMENT AT [facility] Provider Note   CSN: [id number] Arrival date & time: 06/13/22  1104     History  Chief Complaint  Patient presents with   Psychiatric Evaluation         Paul Bray is a 57 y.o. male who presents emergency department with concerns for psych evaluation.  Notes that he was recently at a facility in [facility] for similar concerns and was recently discharged.  Notes that he has had auditory hallucinations telling him to harm himself.  Denies those hallucinations telling him to harm others.  Denies visual hallucinations, SI, HI.  Denies chest pain, shortness of breath, abdominal pain.  Denies illicit drug use or EtOH use.  Patient notes that he was prescribed 10 mg of Haldol which he takes at nighttime and that is the cause for his shaking.  The history is provided by the patient. No language interpreter was used.       Home Medications Prior to Admission medications   Medication Sig Start Date End Date Taking? Authorizing Provider  buprenorphine (SUBUTEX) 8 MG SUBL SL tablet Place 8 mg under the tongue 3 (three) times daily. 11/11/21  Yes [provider]  diphenhydrAMINE (BENADRYL) 50 MG tablet Take 2-4 tablets q6h as needed for restlessness related to Haldol Patient taking differently: Take 25 mg by mouth every 8 (eight) hours as needed (For restlessness per patient). 06/11/22  Yes Dara Hoyer, PA-C  doxycycline (VIBRAMYCIN) 50 MG capsule Take 1 capsule (50 mg total) by mouth daily. 12/25/21 06/23/22 Yes Dara Hoyer, PA-C  gabapentin (NEURONTIN) 400 MG capsule Take 1 capsule (400 mg total) by mouth 2 (two) times daily for 180 doses. 06/11/22 09/09/22 Yes Dara Hoyer, PA-C  haloperidol (HALDOL) 10 MG tablet Take 1 tablet (10 mg total) by mouth daily. Patient taking differently: Take 10 mg by mouth at bedtime. 06/11/22 09/09/22 Yes Dara Hoyer, PA-C  hydrOXYzine (ATARAX) 25 MG tablet Take 25 mg by mouth  in the morning and at bedtime.   Yes [provider]  levothyroxine (SYNTHROID) 150 MCG tablet Take 1 tablet (150 mcg total) by mouth daily. 06/11/22 06/11/23 Yes Dara Hoyer, PA-C  modafinil (PROVIGIL) 200 MG tablet Take 600 mg by mouth daily.   Yes [provider]  nicotine (NICODERM CQ - DOSED IN MG/24 HOURS) 21 mg/24hr patch Place 21 mg onto the skin daily.   Yes [provider]  propranolol (INDERAL) 20 MG tablet Take 20 mg by mouth daily as needed (For blood pressure). 11/03/21  Yes [provider]  tamsulosin (FLOMAX) 0.4 MG CAPS capsule Take 0.4 mg by mouth every evening. 05/21/22  Yes [provider]      Allergies    Ingrezza [valbenazine tosylate], Naloxone, and Amphetamine-dextroamphetamine    Review of Systems   Review of Systems  All other systems reviewed and are negative.   Physical Exam Updated Vital Signs BP (!) 141/103   Pulse 99   Temp (!) 97.4 F (36.3 C) (Oral)   Resp 18   SpO2 97%  Physical Exam Vitals and nursing note reviewed.  Constitutional:      General: He is not in acute distress.    Appearance: Normal appearance.  Eyes:     General: No scleral icterus.    Extraocular Movements: Extraocular movements intact.  Cardiovascular:     Rate and Rhythm: Normal rate and regular rhythm.     Pulses: Normal pulses.  Heart sounds: Normal heart sounds.  Pulmonary:     Effort: Pulmonary effort is normal. No respiratory distress.     Breath sounds: Normal breath sounds.  Abdominal:     Palpations: Abdomen is soft. There is no mass.     Tenderness: There is no abdominal tenderness.  Musculoskeletal:        General: Normal range of motion.     Cervical back: Neck supple.  Skin:    General: Skin is warm and dry.     Findings: No rash.  Neurological:     Mental Status: He is alert.     Sensory: Sensation is intact.     Motor: Motor function is intact.  Psychiatric:        Behavior: Behavior normal.      ED Results / Procedures / Treatments   Labs (all labs ordered are listed, but only abnormal results are displayed) Labs Reviewed  COMPREHENSIVE METABOLIC PANEL - Abnormal; Notable for the following components:      Result Value   Glucose, Bld 101 (*)    Creatinine, Ser 1.26 (*)    All other components within normal limits  CBC WITH DIFFERENTIAL/PLATELET - Abnormal; Notable for the following components:   WBC 3.9 (*)    All other components within normal limits  SALICYLATE LEVEL - Abnormal; Notable for the following components:   Salicylate Lvl [id number] (*)    All other components within normal limits  ACETAMINOPHEN LEVEL - Abnormal; Notable for the following components:   Acetaminophen (Tylenol), Serum <10 (*)    All other components within normal limits  RESP PANEL BY RT-PCR (RSV, FLU A&B, COVID)  [id number]  ETHANOL  RAPID URINE DRUG SCREEN, HOSP PERFORMED    EKG None  Radiology No results found.  Procedures Procedures    Medications Ordered in ED Medications  nicotine (NICODERM CQ - dosed in mg/24 hours) patch 21 mg (21 mg Transdermal Patch Applied 06/13/22 1210)  haloperidol (HALDOL) tablet 10 mg (has no administration in time range)  buprenorphine (SUBUTEX) sublingual tablet 8 mg (has no administration in time range)  gabapentin (NEURONTIN) capsule 400 mg (has no administration in time range)  benztropine (COGENTIN) tablet 1 mg (has no administration in time range)  hydrOXYzine (ATARAX) tablet 25 mg (has no administration in time range)  LORazepam (ATIVAN) tablet 1 mg (1 mg Oral Given 06/13/22 1258)    ED Course/ Medical Decision Making/ A&P                             Medical Decision Making Amount and/or Complexity of Data Reviewed Labs: ordered.  Risk OTC drugs. Prescription drug management.   Pt presents with concerns for auditory hallucinations.  Denies SI, HI, visual hallucinations.  Patient afebrile.  No acute cardiovascular, respiratory, abdominal  exam findings.   Co morbidities that complicate the patient evaluation: Alcohol use disorder Opioid use withdrawal Delusional disorder Paranoid schizophrenia Stimulant use disorder Nicotine dependence  Additional history obtained:  Additional history obtained from Parent  Labs:  I ordered, and personally interpreted labs.  The pertinent results include:   acetaminophen, salicylate, ethanol negative UDS negative COVID, flu, RSV swab negative CBC negative CMP with slightly elevated creatinine 1.26, stable from baseline   Medications:  I ordered medication including Ativan, NicoDerm for symptom management Reevaluation of the patient after these medicines and interventions, I reevaluated the patient and found that they have improved I have reviewed the patients home  medicines and have made adjustments as needed   Disposition: Presentation suspicious for medical clearance for psychiatric evaluation. Labs without acute findings, patient medically cleared at this time. TTS consult placed. Dispo as per TTS team.    This chart was dictated using voice recognition software, Dragon. Despite the best efforts of this provider to proofread and correct errors, errors may still occur which can change documentation meaning.   Final Clinical Impression(s) / ED Diagnoses Final diagnoses:  Auditory hallucination    Rx / DC Orders ED Discharge Orders     None         Conner Neiss A, PA-C 06/13/22 1503    Charlesetta Shanks, MD 06/18/22 1514

## 2022-06-13 NOTE — ED Notes (Signed)
Pts belonging placed in [facility] locker #[id number]

## 2022-06-13 NOTE — ED Notes (Signed)
Restless, agitated, pleasant and cooperative. Requesting something for anxiety.

## 2022-06-13 NOTE — Consult Note (Signed)
[place] ED ASSESSMENT   Reason for Consult:  Psychiatry evaluation Referring Physician:  ER Physician Patient Identification: Paul Bray MRN:  [id number] ED Chief Complaint: Undifferentiated schizophrenia [facility])  Diagnosis:  Principal Problem:   Undifferentiated schizophrenia ([place]) Active Problems:   Opioid use disorder, moderate, on maintenance therapy [facility])   ED Assessment Time Calculation: Start Time: 1456 Stop Time: 1520 Total Time in Minutes (Assessment Completion): 24   Subjective:   Paul Bray is a 57 y.o. male patient admitted with hx of Schizophrenia, ADHD, Substance abuse-Opiate Dependence on Maintenance  came in with complaint of Auditory hallucination hearing many voices saying derogatory things to him and about him.  Patient left [facility] in [place] two weeks ago.  On arrival patient was restless, agitated moving around his stretcher and asking for his Surbitex.  HPI:  Patient was seen for evaluation and he reports intense and annoying auditory hallucinations with voices telling him to harm himself.  Patient was tearful stating the voices are annoying to him. Voices tells him to kill himself or they will kill him and kill his family members.   He also was constantly asking for his Subutex.  Search of [employer]  PDMP states his refilled 90 tablets of his Subutex two days ago.  Patient reports no sleep and he appears unkempt and Disheveled.  Patient reports poor sleep and horrible appetite-soup three times a day.  Patient lives with his mother and is unemployed.  Patient was released from [facility] less than two weeks ago.  Patient states he was not ready when he was released from the hospital.  Patient's Psychiatric provider is [place] health provider.  Patient's UDS is negative at this time and no Alcohol is negative.  Patient remains suicidal with plan to run into traffic.  He reports AH but denies VH and no mention of paranoia. Caucasian male with hx of  ADHD,Schizophrenia, Polysubstance abuse came to the ER for auditory hallucination he states hs been going on for quite some time.  Patient states in the last 24 hours it has become intense and he cannot sleep and the voices tells him to kill himself.  We have resumed his home medications and will seek inpatient Psychiatric hospitalization.  We will seek bed placement at any facility with available bed.  Past Psychiatric History: hx of Schizophrenia, ADHD, Substance abuse-Opiate Dependence on Maintenance  Previous inpatient Psychiatry hospitalization.  [name] is Mental health provider  Risk to Self or Others: Is the patient at risk to self? Yes Has the patient been a risk to self in the past 6 months? Yes Has the patient been a risk to self within the distant past? Yes Is the patient a risk to others? No Has the patient been a risk to others in the past 6 months? No Has the patient been a risk to others within the distant past? No  [place] Scale:  [place] ED from 06/13/2022 in [facility] Emergency Department at [facility] ED from 05/25/2022 in [facility] Department at [facility] ED from 05/16/2022 in [facility] Department at [place] No Risk No Risk No Risk       AIMS:  , , ,  ,   ASAM:    Substance Abuse:     Past Medical History:  Past Medical History:  Diagnosis Date   ADHD    Anxiety    Brain tumor ([place])    balance  and occ memory issues and headaches   Brain tumor ([place])    sees [facility] q year   Depression    Headache    migraine and tension   Hypothyroidism    Soft tissue mass    left shoulder   Substance abuse [facility])     Past Surgical History:  Procedure Laterality Date   colonscopy     gamma knife radiation 2018 for brain tumor'     hematoma removed from arm Left    MASS EXCISION Left 06/30/2019   Procedure: EXCISION OF SOFT TISSUE  MASS LEFT SHOULDER;  Surgeon: Armandina Gemma,  MD;  Location: [place];  Service: General;  Laterality: Left;  LMA   Family History: History reviewed. No pertinent family history. Family Psychiatric  History: denies Social History:  Social History   Substance and Sexual Activity  Alcohol Use Not Currently   Comment: quit Oct 2019     Social History   Substance and Sexual Activity  Drug Use Not Currently   Types: Cocaine, IV, Heroin, MDMA (Ecstacy)   Comment: last reported use; cocaine and heroin in 2019; MDMA Feb 2023    Social History   Socioeconomic History   Marital status: Single    Spouse name: Not on file   Number of children: Not on file   Years of education: Not on file   Highest education level: Not on file  Occupational History   Not on file  Tobacco Use   Smoking status: Former    Types: Cigarettes   Smokeless tobacco: Current   Tobacco comments:    quit jan 2020  Vaping Use   Vaping Use: Every day  Substance and Sexual Activity   Alcohol use: Not Currently    Comment: quit Oct 2019   Drug use: Not Currently    Types: Cocaine, IV, Heroin, MDMA (Ecstacy)    Comment: last reported use; cocaine and heroin in 2019; MDMA Feb 2023   Sexual activity: Not Currently  Other Topics Concern   Not on file  Social History Narrative   Not on file   Social Determinants of Health   Financial Resource Strain: Not on file  Food Insecurity: Not on file  Transportation Needs: Not on file  Physical Activity: Not on file  Stress: Not on file  Social Connections: Not on file   Additional Social History:    Allergies:   Allergies  Allergen Reactions   Ingrezza [Valbenazine Tosylate] Other (See Comments)    Severe tongue chewing at 80 mg Tolerates lower doses   Naloxone Palpitations    Pt report MAT Rx is Subutex/Buprenorphine only   Amphetamine-Dextroamphetamine Other (See Comments)    unknown    Labs:  Results for orders placed or performed during the hospital encounter of 06/13/22  (from the past 48 hour(s))  Resp panel by RT-PCR (RSV, Flu A&B, Covid) Anterior Nasal Swab     Status: None   Collection Time: 06/13/22 11:29 AM   Specimen: Anterior Nasal Swab  Result Value Ref Range   SARS Coronavirus 2 by RT PCR NEGATIVE NEGATIVE    Comment: (NOTE) SARS-CoV-2 target nucleic acids are NOT DETECTED.  The SARS-CoV-2 RNA is generally detectable in upper respiratory specimens during the acute phase of infection. The lowest concentration of SARS-CoV-2 viral copies this assay can detect is 138 copies/mL. A negative result does not preclude SARS-Cov-2 infection and should not be used as the sole basis for treatment or other patient management decisions.  A negative result may occur with  improper specimen collection/handling, submission of specimen other than nasopharyngeal swab, presence of viral mutation(s) within the areas targeted by this assay, and inadequate number of viral copies(<138 copies/mL). A negative result must be combined with clinical observations, patient history, and epidemiological information. The expected result is Negative.  Fact Sheet for Patients:  EntrepreneurPulse.com.au  Fact Sheet for Healthcare Providers:  IncredibleEmployment.be  This test is no t yet approved or cleared by the [place] FDA and  has been authorized for detection and/or diagnosis of SARS-CoV-2 by FDA under an Emergency Use Authorization (EUA). This EUA will remain  in effect (meaning this test can be used) for the duration of the COVID-19 declaration under Section 564(b)(1) of the Act, 21 U.S.C.section 360bbb-3(b)(1), unless the authorization is terminated  or revoked sooner.       Influenza A by PCR NEGATIVE NEGATIVE   Influenza B by PCR NEGATIVE NEGATIVE    Comment: (NOTE) The Xpert Xpress SARS-CoV-2/FLU/RSV plus assay is intended as an aid in the diagnosis of influenza from Nasopharyngeal swab specimens and should not be  used as a sole basis for treatment. Nasal washings and aspirates are unacceptable for Xpert Xpress SARS-CoV-2/FLU/RSV testing.  Fact Sheet for Patients: EntrepreneurPulse.com.au  Fact Sheet for Healthcare Providers: IncredibleEmployment.be  This test is not yet approved or cleared by the [place] FDA and has been authorized for detection and/or diagnosis of SARS-CoV-2 by FDA under an Emergency Use Authorization (EUA). This EUA will remain in effect (meaning this test can be used) for the duration of the COVID-19 declaration under Section 564(b)(1) of the Act, 21 U.S.C. section 360bbb-3(b)(1), unless the authorization is terminated or revoked.     Resp Syncytial Virus by PCR NEGATIVE NEGATIVE    Comment: (NOTE) Fact Sheet for Patients: EntrepreneurPulse.com.au  Fact Sheet for Healthcare Providers: IncredibleEmployment.be  This test is not yet approved or cleared by the [place] FDA and has been authorized for detection and/or diagnosis of SARS-CoV-2 by FDA under an Emergency Use Authorization (EUA). This EUA will remain in effect (meaning this test can be used) for the duration of the COVID-19 declaration under Section 564(b)(1) of the Act, 21 U.S.C. section 360bbb-3(b)(1), unless the authorization is terminated or revoked.  Performed at [facility], [place] [address] [place], [place] [address]   Comprehensive metabolic panel     Status: Abnormal   Collection Time: 06/13/22 11:44 AM  Result Value Ref Range   Sodium 137 135 - 145 mmol/L   Potassium 3.8 3.5 - 5.1 mmol/L   Chloride 101 98 - 111 mmol/L   CO2 23 22 - 32 mmol/L   Glucose, Bld 101 (H) 70 - 99 mg/dL    Comment: Glucose reference range applies only to samples taken after fasting for at least 8 hours.   BUN 11 6 - 20 mg/dL   Creatinine, Ser 1.26 (H) 0.61 - 1.24 mg/dL   Calcium 9.0 8.9 - 10.3 mg/dL   Total Protein  7.3 6.5 - 8.1 g/dL   Albumin 4.4 3.5 - 5.0 g/dL   AST 25 15 - 41 U/L   ALT 20 0 - 44 U/L   Alkaline Phosphatase 63 38 - 126 U/L   Total Bilirubin 0.6 0.3 - 1.2 mg/dL   GFR, Estimated >60 >60 mL/min    Comment: (NOTE) Calculated using the CKD-EPI Creatinine Equation (2021)    Anion gap 13 5 - 15    Comment: Performed at [facility],  [place] [address] [place], [place] [address]  Ethanol     Status: None   Collection Time: 06/13/22 11:44 AM  Result Value Ref Range   Alcohol, Ethyl (B) <10 <10 mg/dL    Comment: (NOTE) Lowest detectable limit for serum alcohol is 10 mg/dL.  For medical purposes only. Performed at [facility], [place] [address], [place], [place] [address]   CBC with Diff     Status: Abnormal   Collection Time: 06/13/22 11:44 AM  Result Value Ref Range   WBC 3.9 (L) 4.0 - 10.5 K/uL   RBC 4.45 4.22 - 5.81 MIL/uL   Hemoglobin 14.0 13.0 - 17.0 g/dL   HCT 40.7 39.0 - 52.0 %   MCV 91.5 80.0 - 100.0 fL   MCH 31.5 26.0 - 34.0 pg   MCHC 34.4 30.0 - 36.0 g/dL   RDW 13.4 11.5 - 15.5 %   Platelets 186 150 - 400 K/uL   nRBC 0.0 0.0 - 0.2 %   Neutrophils Relative % 68 %   Neutro Abs 2.7 1.7 - 7.7 K/uL   Lymphocytes Relative 19 %   Lymphs Abs 0.7 0.7 - 4.0 K/uL   Monocytes Relative 12 %   Monocytes Absolute 0.5 0.1 - 1.0 K/uL   Eosinophils Relative 0 %   Eosinophils Absolute 0.0 0.0 - 0.5 K/uL   Basophils Relative 0 %   Basophils Absolute 0.0 0.0 - 0.1 K/uL   Immature Granulocytes 1 %   Abs Immature Granulocytes 0.02 0.00 - 0.07 K/uL    Comment: Performed at [facility], [place] [address], [place], [place] [id number]  Salicylate level     Status: Abnormal   Collection Time: 06/13/22 11:44 AM  Result Value Ref Range   Salicylate Lvl Q000111Q (L) 7.0 - 30.0 mg/dL    Comment: Performed at [facility], [place] [address] [place], [place] [address]  Acetaminophen level     Status: Abnormal   Collection Time:  06/13/22 11:44 AM  Result Value Ref Range   Acetaminophen (Tylenol), Serum <10 (L) 10 - 30 ug/mL    Comment: (NOTE) Therapeutic concentrations vary significantly. A range of 10-30 ug/mL  may be an effective concentration for many patients. However, some  are best treated at concentrations outside of this range. Acetaminophen concentrations >150 ug/mL at 4 hours after ingestion  and >50 ug/mL at 12 hours after ingestion are often associated with  toxic reactions.  Performed at [facility], [place] [address] [place], [place] [address]   Urine rapid drug screen (hosp performed)     Status: None   Collection Time: 06/13/22 12:45 PM  Result Value Ref Range   Opiates NONE DETECTED NONE DETECTED   Cocaine NONE DETECTED NONE DETECTED   Benzodiazepines NONE DETECTED NONE DETECTED   Amphetamines NONE DETECTED NONE DETECTED   Tetrahydrocannabinol NONE DETECTED NONE DETECTED   Barbiturates NONE DETECTED NONE DETECTED    Comment: (NOTE) DRUG SCREEN FOR MEDICAL PURPOSES ONLY.  IF CONFIRMATION IS NEEDED FOR ANY PURPOSE, NOTIFY LAB WITHIN 5 DAYS.  LOWEST DETECTABLE LIMITS FOR URINE DRUG SCREEN Drug Class                     Cutoff (ng/mL) Amphetamine and metabolites    1000 Barbiturate and metabolites    200 Benzodiazepine                 200 Opiates and metabolites  300 Cocaine and metabolites        300 THC                            50 Performed at [facility], [place] [address], [place], [place] [address]     Current Facility-Administered Medications  Medication Dose Route Frequency Provider Last Rate Last Admin   benztropine (COGENTIN) tablet 1 mg  1 mg Oral QHS Cleatrice Burke, Liboria Putnam C, NP       buprenorphine (SUBUTEX) sublingual tablet 8 mg  8 mg Sublingual TID Charmaine Downs C, NP   8 mg at 06/13/22 1532   gabapentin (NEURONTIN) capsule 400 mg  400 mg Oral BID Charmaine Downs C, NP   400 mg at 06/13/22 1532   haloperidol (HALDOL) tablet  10 mg  10 mg Oral QHS Charmaine Downs C, NP       hydrOXYzine (ATARAX) tablet 25 mg  25 mg Oral TID PRN Delfin Gant, NP       nicotine (NICODERM CQ - dosed in mg/24 hours) patch 21 mg  21 mg Transdermal Once Blue, Soijett A, PA-C   21 mg at 06/13/22 1210   Current Outpatient Medications  Medication Sig Dispense Refill   buprenorphine (SUBUTEX) 8 MG SUBL SL tablet Place 8 mg under the tongue 3 (three) times daily.     diphenhydrAMINE (BENADRYL) 50 MG tablet Take 2-4 tablets q6h as needed for restlessness related to Haldol (Patient taking differently: Take 25 mg by mouth every 8 (eight) hours as needed (For restlessness per patient).) 100 tablet 2   doxycycline (VIBRAMYCIN) 50 MG capsule Take 1 capsule (50 mg total) by mouth daily. 90 capsule 1   gabapentin (NEURONTIN) 400 MG capsule Take 1 capsule (400 mg total) by mouth 2 (two) times daily for 180 doses. 180 capsule 0   haloperidol (HALDOL) 10 MG tablet Take 1 tablet (10 mg total) by mouth daily. (Patient taking differently: Take 10 mg by mouth at bedtime.) 90 tablet 0   hydrOXYzine (ATARAX) 25 MG tablet Take 25 mg by mouth in the morning and at bedtime.     levothyroxine (SYNTHROID) 150 MCG tablet Take 1 tablet (150 mcg total) by mouth daily. 30 tablet 11   modafinil (PROVIGIL) 200 MG tablet Take 600 mg by mouth daily.     nicotine (NICODERM CQ - DOSED IN MG/24 HOURS) 21 mg/24hr patch Place 21 mg onto the skin daily.     propranolol (INDERAL) 20 MG tablet Take 20 mg by mouth daily as needed (For blood pressure).     tamsulosin (FLOMAX) 0.4 MG CAPS capsule Take 0.4 mg by mouth every evening.      Musculoskeletal: Strength & Muscle Tone: within normal limits Gait & Station: normal Patient leans: Front   Psychiatric Specialty Exam: Presentation  General Appearance:  Disheveled; Casual  Eye Contact: Good  Speech: Clear and Coherent; Normal Rate  Speech Volume: Normal  Handedness: Right   Mood and Affect   Mood: Anxious  Affect: Congruent   Thought Process  Thought Processes: Coherent; Goal Directed; Linear  Descriptions of Associations:Intact  Orientation:Full (Time, Place and Person)  Thought Content:Logical  History of Schizophrenia/Schizoaffective disorder:Yes  Duration of Psychotic Symptoms:Greater than six months  Hallucinations:Hallucinations: Auditory Description of Auditory Hallucinations: Many voices saying derogotary thing about him and to him.  Ideas of Reference:None  Suicidal Thoughts:Suicidal Thoughts: Yes, Passive SI Passive Intent and/or Plan: -- (Voices are annoying and makes  him feel suicidal)  Homicidal Thoughts:Homicidal Thoughts: No   Sensorium  Memory: Immediate Good; Recent Good; Remote Good  Judgment: Good  Insight: Good   Executive Functions  Concentration: Good  Attention Span: Good  Recall: Good  Fund of Knowledge: Good  Language: Good   Psychomotor Activity  Psychomotor Activity: Psychomotor Activity: Increased; Restlessness; Other (comment) (anxious, walking arouns.)   Assets  Assets: Armed forces logistics/support/administrative officer; Desire for Improvement; Housing    Sleep  Sleep: Sleep: Fair   Physical Exam: Physical Exam Vitals and nursing note reviewed.  Constitutional:      Appearance: Normal appearance.  HENT:     Head: Normocephalic.     Nose: Nose normal.  Cardiovascular:     Rate and Rhythm: Normal rate and regular rhythm.  Pulmonary:     Effort: Pulmonary effort is normal.  Musculoskeletal:        General: Normal range of motion.     Cervical back: Normal range of motion.  Skin:    General: Skin is warm and dry.  Neurological:     General: No focal deficit present.     Mental Status: He is alert and oriented to person, place, and time.    Review of Systems  Constitutional: Negative.   HENT: Negative.    Eyes: Negative.   Respiratory: Negative.    Cardiovascular: Negative.   Gastrointestinal: Negative.    Genitourinary: Negative.   Musculoskeletal: Negative.   Skin: Negative.   Neurological: Negative.   Endo/Heme/Allergies: Negative.   Psychiatric/Behavioral:  Positive for hallucinations and suicidal ideas.    Blood pressure (!) 141/103, pulse 99, temperature 98.4 F (36.9 C), temperature source Oral, resp. rate 18, SpO2 97 %. There is no height or weight on file to calculate BMI.  Medical Decision Making: Caucasian male with hx of ADHD,Schizophrenia, Polysubstance abuse came to the ER for auditory hallucination he states hs been going on for quite some time.  Patient states in the last 24 hours it has become intense and he cannot sleep and the voices tells him to kill himself.  We have resumed his home medications and will seek inpatient Psychiatric hospitalization.  We will seek bed placement at any facility with available bed.  Problem 1: Undifferentiated Schizophrenia  Problem 2: Suicide ideation  Disposition:  Admit seek bed placement   Delfin Gant, NP-PMHNP-BC 06/13/2022 4:37 PM

## 2022-06-14 DIAGNOSIS — F203 Undifferentiated schizophrenia: Secondary | ICD-10-CM | POA: Diagnosis not present

## 2022-06-14 MED ORDER — LEVOTHYROXINE SODIUM 50 MCG PO TABS
150.0000 ug | ORAL_TABLET | Freq: Every day | ORAL | Status: DC
  Administered 2022-06-14 – 2022-06-17 (×4): 150 ug via ORAL
  Filled 2022-06-14 (×4): qty 1

## 2022-06-14 MED ORDER — TAMSULOSIN HCL 0.4 MG PO CAPS
0.4000 mg | ORAL_CAPSULE | Freq: Once | ORAL | Status: AC
  Administered 2022-06-14: 0.4 mg via ORAL
  Filled 2022-06-14: qty 1

## 2022-06-14 MED ORDER — LIDOCAINE HCL URETHRAL/MUCOSAL 2 % EX GEL
1.0000 | Freq: Once | CUTANEOUS | Status: AC
  Administered 2022-06-14: 1 via URETHRAL
  Filled 2022-06-14: qty 11

## 2022-06-14 MED ORDER — TAMSULOSIN HCL 0.4 MG PO CAPS
0.4000 mg | ORAL_CAPSULE | Freq: Every evening | ORAL | Status: DC
  Administered 2022-06-14 – 2022-06-15 (×2): 0.4 mg via ORAL
  Filled 2022-06-14 (×4): qty 1

## 2022-06-14 MED ORDER — DOXYCYCLINE HYCLATE 100 MG PO TABS
50.0000 mg | ORAL_TABLET | Freq: Every day | ORAL | Status: DC
  Administered 2022-06-14 – 2022-06-17 (×4): 50 mg via ORAL
  Filled 2022-06-14 (×2): qty 0.5
  Filled 2022-06-14: qty 1
  Filled 2022-06-14: qty 0.5

## 2022-06-14 NOTE — ED Notes (Signed)
Pt escorted back to [facility], belongings placed in tcu locker 34.

## 2022-06-14 NOTE — ED Notes (Signed)
Pt's mother Kieth Brightly called in and requested that pt only be placed with [facility] services. Pt's counselor called pt's mother and told her that pt should not be placed outside of [place] area and that he needed to go to [facility] specifically. Pt's mother said to please call her if any questions arises about placement.

## 2022-06-14 NOTE — ED Notes (Signed)
Pt refusing to allow staff to place foley cath, provider aware. Pt to be sent back to unit

## 2022-06-14 NOTE — Progress Notes (Signed)
CSW has requested [facility] Oris Drone, RN to review pt for inpatient behavioral health placement. Pt meets inpatient per Delfin Gant, NP. CSW and Disposition team will assist and follow.   Benjaman Kindler, MSW, [facility] 06/14/2022 3:35 PM

## 2022-06-14 NOTE — ED Provider Notes (Addendum)
See same-day emergency department notes.  I was informed that he is having difficulty urinating. Yesterday when I saw the patient, he refused to take his Flomax.  Now he is developing urinary retention.  Prior provider ordered a Foley catheterization for suspected retention.  Patient refused this intervention. Patient is being evaluated for psychiatric disease, but is refusing Foley catheterization.  Patient maintains capacity to refuse interventions to his genitals at this time.  If he does not want to trial Foley catheterization, then we will continue his Flomax.   Tretha Sciara, MD 06/14/22 2119    Tretha Sciara, MD 06/14/22 2119

## 2022-06-14 NOTE — ED Notes (Signed)
Foley attempt unsuccessful. Tried coaching pt with breathing techniques to help with placement. Pt jumped out of bed while attempting to take foley out. Pt becoming very hostile.

## 2022-06-14 NOTE — ED Notes (Addendum)
Tech attempted foley X 1, unsuccessful, pt refuses to allow this RN to attempt coude, pt now states that he wants to leave.

## 2022-06-14 NOTE — Progress Notes (Signed)
[facility] Psych ED Progress Note  06/14/2022 7:51 PM Paul Bray  MRN:  [id number]   Subjective:  Paul Bray is a 57 y.o. male patient admitted with hx of Schizophrenia, ADHD, Substance abuse-Opiate Dependence on Maintenance  came in with complaint of Auditory hallucination hearing many voices saying derogatory things to him and about him.  Patient left [place] in [place] two weeks ago.  On arrival patient was restless, agitated moving around his stretcher and asking for his Surbitex.  Patient continues to hear voices telling him some times that they will kill him and kill family members.  Patient is much calmer today compared to yesterday. He is compliant with his Medications.  Today she started retaining urine and needed to be cath to get urine out.  Patient has resumed his Flomax.  At this time patient is transferred back to the main ER.  If he becomes admitted to the Medical floor Psychiatry will follow him upstairs.  Patient continues to endorse suicide ideation. Principal Problem: Undifferentiated schizophrenia ([place]) Diagnosis:  Principal Problem:   Undifferentiated schizophrenia ([place]) Active Problems:   Opioid use disorder, moderate, on maintenance therapy [facility])   ED Assessment Time Calculation: Start Time: 1939 Stop Time: 1951 Total Time in Minutes (Assessment Completion): 12   Past Psychiatric History: see initial psychiatry evaluation note  [place] Scale:  [place] ED from 06/13/2022 in [facility] Emergency Department at [facility] ED from 05/25/2022 in [facility] Emergency Department at [facility] ED from 05/16/2022 in [facility] Emergency Department at [place] No Risk No Risk No Risk       Past Medical History:  Past Medical History:  Diagnosis Date   ADHD    Anxiety    Brain tumor ([place])    balance and occ memory issues and headaches   Brain tumor ([place])    sees [facility] q year   Depression    Headache     migraine and tension   Hypothyroidism    Soft tissue mass    left shoulder   Substance abuse [facility])     Past Surgical History:  Procedure Laterality Date   colonscopy     gamma knife radiation 2018 for brain tumor'     hematoma removed from arm Left    MASS EXCISION Left 06/30/2019   Procedure: EXCISION OF SOFT TISSUE  MASS LEFT SHOULDER;  Surgeon: Armandina Gemma, MD;  Location: [place];  Service: General;  Laterality: Left;  LMA   Family History: History reviewed. No pertinent family history. Family Psychiatric  History: See initial Psychiatry evaluation note Social History:  Social History   Substance and Sexual Activity  Alcohol Use Not Currently   Comment: quit Oct 2019     Social History   Substance and Sexual Activity  Drug Use Not Currently   Types: Cocaine, IV, Heroin, MDMA (Ecstacy)   Comment: last reported use; cocaine and heroin in 2019; MDMA Feb 2023    Social History   Socioeconomic History   Marital status: Single    Spouse name: Not on file   Number of children: Not on file   Years of education: Not on file   Highest education level: Not on file  Occupational History   Not on file  Tobacco Use   Smoking status: Former    Types: Cigarettes   Smokeless tobacco: Current   Tobacco comments:    quit jan 2020  Vaping Use  Vaping Use: Every day  Substance and Sexual Activity   Alcohol use: Not Currently    Comment: quit Oct 2019   Drug use: Not Currently    Types: Cocaine, IV, Heroin, MDMA (Ecstacy)    Comment: last reported use; cocaine and heroin in 2019; MDMA Feb 2023   Sexual activity: Not Currently  Other Topics Concern   Not on file  Social History Narrative   Not on file   Social Determinants of Health   Financial Resource Strain: Not on file  Food Insecurity: Not on file  Transportation Needs: Not on file  Physical Activity: Not on file  Stress: Not on file  Social Connections: Not on file    Sleep:  Good  Appetite:  Fair  Current Medications: Current Facility-Administered Medications  Medication Dose Route Frequency Provider Last Rate Last Admin   benztropine (COGENTIN) tablet 1 mg  1 mg Oral QHS Jalaysia Lobb C, NP   1 mg at 06/13/22 2202   buprenorphine (SUBUTEX) sublingual tablet 8 mg  8 mg Sublingual TID Charmaine Downs C, NP   8 mg at 06/14/22 1603   doxycycline (VIBRA-TABS) tablet 50 mg  50 mg Oral Daily Larene Pickett, PA-C   50 mg at 06/14/22 0950   gabapentin (NEURONTIN) capsule 400 mg  400 mg Oral BID Charmaine Downs C, NP   400 mg at 06/14/22 0950   haloperidol (HALDOL) tablet 10 mg  10 mg Oral QHS Karan Inclan C, NP   10 mg at 06/13/22 2201   hydrOXYzine (ATARAX) tablet 25 mg  25 mg Oral TID PRN Charmaine Downs C, NP   25 mg at 06/13/22 2201   levothyroxine (SYNTHROID) tablet 150 mcg  150 mcg Oral Daily Larene Pickett, PA-C   150 mcg at 06/14/22 0630   tamsulosin (FLOMAX) capsule 0.4 mg  0.4 mg Oral QPM Larene Pickett, PA-C   0.4 mg at 06/14/22 [id number]   Current Outpatient Medications  Medication Sig Dispense Refill   buprenorphine (SUBUTEX) 8 MG SUBL SL tablet Place 8 mg under the tongue 3 (three) times daily.     diphenhydrAMINE (BENADRYL) 50 MG tablet Take 2-4 tablets q6h as needed for restlessness related to Haldol (Patient taking differently: Take 25 mg by mouth every 8 (eight) hours as needed (For restlessness per patient).) 100 tablet 2   doxycycline (VIBRAMYCIN) 50 MG capsule Take 1 capsule (50 mg total) by mouth daily. 90 capsule 1   gabapentin (NEURONTIN) 400 MG capsule Take 1 capsule (400 mg total) by mouth 2 (two) times daily for 180 doses. 180 capsule 0   haloperidol (HALDOL) 10 MG tablet Take 1 tablet (10 mg total) by mouth daily. (Patient taking differently: Take 10 mg by mouth at bedtime.) 90 tablet 0   hydrOXYzine (ATARAX) 25 MG tablet Take 25 mg by mouth in the morning and at bedtime.     levothyroxine (SYNTHROID) 150 MCG tablet Take 1 tablet  (150 mcg total) by mouth daily. 30 tablet 11   modafinil (PROVIGIL) 200 MG tablet Take 600 mg by mouth daily.     nicotine (NICODERM CQ - DOSED IN MG/24 HOURS) 21 mg/24hr patch Place 21 mg onto the skin daily.     propranolol (INDERAL) 20 MG tablet Take 20 mg by mouth daily as needed (For blood pressure).     tamsulosin (FLOMAX) 0.4 MG CAPS capsule Take 0.4 mg by mouth every evening.      Lab Results:  Results for orders placed or performed  during the hospital encounter of 06/13/22 (from the past 48 hour(s))  Resp panel by RT-PCR (RSV, Flu A&B, Covid) Anterior Nasal Swab     Status: None   Collection Time: 06/13/22 11:29 AM   Specimen: Anterior Nasal Swab  Result Value Ref Range   SARS Coronavirus 2 by RT PCR NEGATIVE NEGATIVE    Comment: (NOTE) SARS-CoV-2 target nucleic acids are NOT DETECTED.  The SARS-CoV-2 RNA is generally detectable in upper respiratory specimens during the acute phase of infection. The lowest concentration of SARS-CoV-2 viral copies this assay can detect is 138 copies/mL. A negative result does not preclude SARS-Cov-2 infection and should not be used as the sole basis for treatment or other patient management decisions. A negative result may occur with  improper specimen collection/handling, submission of specimen other than nasopharyngeal swab, presence of viral mutation(s) within the areas targeted by this assay, and inadequate number of viral copies(<138 copies/mL). A negative result must be combined with clinical observations, patient history, and epidemiological information. The expected result is Negative.  Fact Sheet for Patients:  EntrepreneurPulse.com.au  Fact Sheet for Healthcare Providers:  IncredibleEmployment.be  This test is no t yet approved or cleared by the [place] FDA and  has been authorized for detection and/or diagnosis of SARS-CoV-2 by FDA under an Emergency Use Authorization (EUA). This EUA  will remain  in effect (meaning this test can be used) for the duration of the COVID-19 declaration under Section 564(b)(1) of the Act, 21 U.S.C.section 360bbb-3(b)(1), unless the authorization is terminated  or revoked sooner.       Influenza A by PCR NEGATIVE NEGATIVE   Influenza B by PCR NEGATIVE NEGATIVE    Comment: (NOTE) The Xpert Xpress SARS-CoV-2/FLU/RSV plus assay is intended as an aid in the diagnosis of influenza from Nasopharyngeal swab specimens and should not be used as a sole basis for treatment. Nasal washings and aspirates are unacceptable for Xpert Xpress SARS-CoV-2/FLU/RSV testing.  Fact Sheet for Patients: EntrepreneurPulse.com.au  Fact Sheet for Healthcare Providers: IncredibleEmployment.be  This test is not yet approved or cleared by the [place] FDA and has been authorized for detection and/or diagnosis of SARS-CoV-2 by FDA under an Emergency Use Authorization (EUA). This EUA will remain in effect (meaning this test can be used) for the duration of the COVID-19 declaration under Section 564(b)(1) of the Act, 21 U.S.C. section 360bbb-3(b)(1), unless the authorization is terminated or revoked.     Resp Syncytial Virus by PCR NEGATIVE NEGATIVE    Comment: (NOTE) Fact Sheet for Patients: EntrepreneurPulse.com.au  Fact Sheet for Healthcare Providers: IncredibleEmployment.be  This test is not yet approved or cleared by the [place] FDA and has been authorized for detection and/or diagnosis of SARS-CoV-2 by FDA under an Emergency Use Authorization (EUA). This EUA will remain in effect (meaning this test can be used) for the duration of the COVID-19 declaration under Section 564(b)(1) of the Act, 21 U.S.C. section 360bbb-3(b)(1), unless the authorization is terminated or revoked.  Performed at [facility], [place] [address], [place], [place] [address]    Comprehensive metabolic panel     Status: Abnormal   Collection Time: 06/13/22 11:44 AM  Result Value Ref Range   Sodium 137 135 - 145 mmol/L   Potassium 3.8 3.5 - 5.1 mmol/L   Chloride 101 98 - 111 mmol/L   CO2 23 22 - 32 mmol/L   Glucose, Bld 101 (H) 70 - 99 mg/dL    Comment: Glucose reference range applies only to  samples taken after fasting for at least 8 hours.   BUN 11 6 - 20 mg/dL   Creatinine, Ser 1.26 (H) 0.61 - 1.24 mg/dL   Calcium 9.0 8.9 - 10.3 mg/dL   Total Protein 7.3 6.5 - 8.1 g/dL   Albumin 4.4 3.5 - 5.0 g/dL   AST 25 15 - 41 U/L   ALT 20 0 - 44 U/L   Alkaline Phosphatase 63 38 - 126 U/L   Total Bilirubin 0.6 0.3 - 1.2 mg/dL   GFR, Estimated >60 >60 mL/min    Comment: (NOTE) Calculated using the CKD-EPI Creatinine Equation (2021)    Anion gap 13 5 - 15    Comment: Performed at [facility], [place] [address], [place], [place] [address]  Ethanol     Status: None   Collection Time: 06/13/22 11:44 AM  Result Value Ref Range   Alcohol, Ethyl (B) <10 <10 mg/dL    Comment: (NOTE) Lowest detectable limit for serum alcohol is 10 mg/dL.  For medical purposes only. Performed at [facility], [place] [address], [place], [place] [address]   CBC with Diff     Status: Abnormal   Collection Time: 06/13/22 11:44 AM  Result Value Ref Range   WBC 3.9 (L) 4.0 - 10.5 K/uL   RBC 4.45 4.22 - 5.81 MIL/uL   Hemoglobin 14.0 13.0 - 17.0 g/dL   HCT 40.7 39.0 - 52.0 %   MCV 91.5 80.0 - 100.0 fL   MCH 31.5 26.0 - 34.0 pg   MCHC 34.4 30.0 - 36.0 g/dL   RDW 13.4 11.5 - 15.5 %   Platelets 186 150 - 400 K/uL   nRBC 0.0 0.0 - 0.2 %   Neutrophils Relative % 68 %   Neutro Abs 2.7 1.7 - 7.7 K/uL   Lymphocytes Relative 19 %   Lymphs Abs 0.7 0.7 - 4.0 K/uL   Monocytes Relative 12 %   Monocytes Absolute 0.5 0.1 - 1.0 K/uL   Eosinophils Relative 0 %   Eosinophils Absolute 0.0 0.0 - 0.5 K/uL   Basophils Relative 0 %   Basophils Absolute 0.0 0.0 - 0.1  K/uL   Immature Granulocytes 1 %   Abs Immature Granulocytes 0.02 0.00 - 0.07 K/uL    Comment: Performed at [facility], [place] [address] [place], [place] [id number]  Salicylate level     Status: Abnormal   Collection Time: 06/13/22 11:44 AM  Result Value Ref Range   Salicylate Lvl [id number] (L) 7.0 - 30.0 mg/dL    Comment: Performed at [facility], [place] [address] [place], [place] [address]  Acetaminophen level     Status: Abnormal   Collection Time: 06/13/22 11:44 AM  Result Value Ref Range   Acetaminophen (Tylenol), Serum <10 (L) 10 - 30 ug/mL    Comment: (NOTE) Therapeutic concentrations vary significantly. A range of 10-30 ug/mL  may be an effective concentration for many patients. However, some  are best treated at concentrations outside of this range. Acetaminophen concentrations >150 ug/mL at 4 hours after ingestion  and >50 ug/mL at 12 hours after ingestion are often associated with  toxic reactions.  Performed at [facility], [place] [address] [place],  [address]   Urine rapid drug screen (hosp performed)     Status: None   Collection Time: 06/13/22 12:45 PM  Result Value Ref Range   Opiates NONE DETECTED NONE DETECTED   Cocaine NONE DETECTED NONE DETECTED  Benzodiazepines NONE DETECTED NONE DETECTED   Amphetamines NONE DETECTED NONE DETECTED   Tetrahydrocannabinol NONE DETECTED NONE DETECTED   Barbiturates NONE DETECTED NONE DETECTED    Comment: (NOTE) DRUG SCREEN FOR MEDICAL PURPOSES ONLY.  IF CONFIRMATION IS NEEDED FOR ANY PURPOSE, NOTIFY LAB WITHIN 5 DAYS.  LOWEST DETECTABLE LIMITS FOR URINE DRUG SCREEN Drug Class                     Cutoff (ng/mL) Amphetamine and metabolites    1000 Barbiturate and metabolites    200 Benzodiazepine                 200 Opiates and metabolites        300 Cocaine and metabolites        300 THC                            50 Performed at [facility], [place] [address] [place], [place] [address]     Blood Alcohol level:  Lab Results  Component Value Date   ETH <10 06/13/2022   ETH <10 05/25/2022    Physical Findings:  CIWA:    COWS:     Musculoskeletal: Strength & Muscle Tone: within normal limits Gait & Station: normal Patient leans: Front  Psychiatric Specialty Exam:  Presentation  General Appearance:  Casual; Disheveled  Eye Contact: Good  Speech: Clear and Coherent; Normal Rate  Speech Volume: Normal  Handedness: Right   Mood and Affect  Mood: Depressed  Affect: Depressed; Congruent   Thought Process  Thought Processes: Coherent; Goal Directed; Linear  Descriptions of Associations:Intact  Orientation:Full (Time, Place and Person)  Thought Content:Logical  History of Schizophrenia/Schizoaffective disorder:Yes  Duration of Psychotic Symptoms:Greater than six months  Hallucinations:Hallucinations: Auditory Description of Auditory Hallucinations: Hears different voices at different times  Ideas of Reference:None  Suicidal Thoughts:Suicidal Thoughts: Yes, Passive SI Passive Intent and/or Plan: -- (Voices are annoying and makes him feel suicidal)  Homicidal Thoughts:Homicidal Thoughts: No   Sensorium  Memory: Immediate Good; Recent Good; Remote Good  Judgment: Good  Insight: Good   Executive Functions  Concentration: Good  Attention Span: Good  Recall: Good  Fund of Knowledge: Good  Language: Good   Psychomotor Activity  Psychomotor Activity: Psychomotor Activity: Normal   Assets  Assets: Communication Skills; Housing; Resilience; Social Support   Sleep  Sleep: Sleep: Good    Physical Exam: Physical Exam Vitals and nursing note reviewed.  Constitutional:      Appearance: Normal appearance.  HENT:     Head: Normocephalic and atraumatic.     Nose: Nose normal.  Cardiovascular:     Rate and Rhythm: Normal rate.  Pulmonary:      Effort: Pulmonary effort is normal.  Musculoskeletal:        General: Normal range of motion.     Cervical back: Normal range of motion.  Skin:    General: Skin is warm and dry.  Neurological:     General: No focal deficit present.     Mental Status: He is alert and oriented to person, place, and time.    Review of Systems  Constitutional: Negative.   HENT: Negative.    Eyes: Negative.   Respiratory: Negative.    Cardiovascular: Negative.   Genitourinary:        New onset Urine retention.  Musculoskeletal: Negative.   Skin: Negative.   Neurological: Negative.   Endo/Heme/Allergies:  Negative.   Psychiatric/Behavioral:  Positive for hallucinations and suicidal ideas. The patient is nervous/anxious.    Blood pressure 115/80, pulse 71, temperature 98.8 F (37.1 C), resp. rate 19, SpO2 93 %. There is no height or weight on file to calculate BMI.   Medical Decision Making: Patient continues to need inpatient Psychiatry hospitalization.  He is also needing Urology care due to Urinary retention.  If he gets Medically admitted Psychiatry will follow him upstairs.  He continues to take his medications.  Problem 1: Undifferentiated Schizophrenia   Problem 2: Suicide ideation  Disposition:  Admit seek bed placement  when Medically cleared. Delfin Gant, NP--PMHNP-BC 06/14/2022, 7:51 PM

## 2022-06-14 NOTE — ED Notes (Addendum)
Foley catheter attempt was unsuccessful. Pt refuses additional attempts. MD aware.

## 2022-06-14 NOTE — ED Provider Notes (Addendum)
Emergency Medicine Observation Re-evaluation Note  Paul Bray is a 57 y.o. male, seen on rounds today.  Pt initially presented to the ED for complaints of Psychiatric Evaluation (/) Currently, the patient is awaiting placement for psychosis.  Patient is IVC.  Patient has been somewhat fixated on Flomax.  Has it ordered once a day at 1800 which is his normal meds.  But he did get some at 1 in the morning is requesting it again.  It is not clear whether he is having difficulty voiding or not or whether he is just fixated on this.  But Flomax is designed to be given once a day.  Physical Exam  BP 105/69 (BP Location: Right Arm)   Pulse 69   Temp 97.7 F (36.5 C) (Oral)   Resp 18   SpO2 97%  Physical Exam General: Nontoxic no acute distress Cardiac:  Lungs: No respiratory distress Psych: Psychosis auditory hallucination  ED Course / MDM  EKG:   I have reviewed the labs performed to date as well as medications administered while in observation.  Recent changes in the last 24 hours include patient requesting Flomax frequently.  Has it ordered at 1800 every day which is appropriate.  Got a dose of 1 in the mornings wanting another dose now.  Not clear whether he is having any difficulty voiding.  We will monitor that.  But is not appropriate to give him Flomax again now.  I am thinking this may be a fixation of his.  Plan  Current plan is for psychiatric placement.    Fredia Sorrow, MD 06/14/22 1021  Addendum: Patient stating that he is unable to void.  Will go and place Foley catheter if he is greater than 800 cc of urine out will switch it to a leg bag.  If its last we will go ahead and remove it.    Fredia Sorrow, MD 06/14/22 1242

## 2022-06-14 NOTE — ED Notes (Signed)
Pt remains asleep with no signs of distress or sleep disturbance respirations appear easy.

## 2022-06-15 MED ORDER — NICOTINE 14 MG/24HR TD PT24
14.0000 mg | MEDICATED_PATCH | Freq: Every day | TRANSDERMAL | Status: DC
  Administered 2022-06-15 – 2022-06-17 (×3): 14 mg via TRANSDERMAL
  Filled 2022-06-15 (×3): qty 1

## 2022-06-15 MED ORDER — FLUOXETINE HCL 20 MG PO CAPS
20.0000 mg | ORAL_CAPSULE | Freq: Every day | ORAL | Status: DC
  Administered 2022-06-16 – 2022-06-17 (×2): 20 mg via ORAL
  Filled 2022-06-15 (×3): qty 1

## 2022-06-15 NOTE — Progress Notes (Signed)
LCSW Progress Note  [id number]   Paul Bray  06/15/2022  10:44 AM  Description:   Inpatient Psychiatric Referral  Patient was recommended inpatient per [name], NP. There are no available beds at [facility]. Patient was referred to the following facilities:   Destination  Service Provider Address Phone Fax  [facility]  [place], [place] [address] [place]  [facility]  [address] [place] [address] [phone] [place]  [place], [place] [address] [phone]  [facility]  [address] [place] [address] [place]  [facility]  [address], [place] [address] [phone]  [place] [address], [place] [phone]  [facility]  [place] [address] [phone]  [facility]  [address], [place] [address] [place]  [address] [place] [address] [phone] [facility]  [address] [place] [address] [phone]  [facility]  [address] [place], [employer] [place] [address] [place]  [facility], [place]  [address] [place] [address] [phone] [place], [place] [phone] [place]  [facility]  [address] [place] [address] [phone]  [place]  [place], [place] [address] [phone] [place]  [address], [place] [address] [phone]  [phone]    Situation ongoing, CSW to continue following and update chart as more information becomes available.      Denna Haggard, Latanya Presser  06/15/2022 10:44 AM

## 2022-06-15 NOTE — ED Notes (Signed)
Pt resting at this time, no distress noted

## 2022-06-15 NOTE — Progress Notes (Signed)
[facility] Psych ED Progress Note  06/15/2022 12:06 PM Paul Bray  MRN:  [id number]   Subjective:   Pt seen at [facility] for face to face reevaluation. Per chart review, it does appear patient has made significant improvements. He is still irritable and agitated at times, however this could be baseline for him. He cooperative and engages in assessment. Pt tells me since taking haldol 27m Qhs his auditory hallucinations have decreased significantly. Pt stated he is currently not hearing any voices this morning. He denies VH.   Pt also denying suicidal or homicidal ideations stating, "if there are no voices telling me it then I don't feel that way so for right now I am good." Pt denies any side effects such as N/V/D, muscle cramping, tremors. He reports "okay" sleep, reports difficulties sleeping due to environmental factors such as loud noises and lights while in ED. No problems with appetite.   Will continue to recommend inpatient psychiatric treatment at this time. However, if patient continues to show significant improvements could consider discharge tomorrow if there are no available inpatient units prior to then.   Principal Problem: Undifferentiated schizophrenia ([place] Diagnosis:  Principal Problem:   Undifferentiated schizophrenia ([place] Active Problems:   Substance induced mood disorder ([place]   Opioid use disorder, moderate, on maintenance therapy ([facility]   ED Assessment Time Calculation: Start Time: 1200 Stop Time: 1225 Total Time in Minutes (Assessment Completion): 2ElbertScale:  [place] [facility] at [place] No Risk No Risk       Past Medical History:  Past Medical History:  Diagnosis Date   ADHD    Anxiety    Brain tumor (HBr[place]     balance and occ memory issues and headaches   Brain tumor ([place]    [facility]   Depression    Headache    migraine and tension   Hypothyroidism    Soft tissue mass    left shoulder   Substance [facility] ([facility]     Past Surgical History:  Procedure Laterality Date   colonscopy     gamma knife radiation 2018 for brain tumor'     hematoma removed from arm Left    MASS EXCISION Left 06/30/2019   Procedure: EXCISION OF SOFT TISSUE  MASS LEFT SHOULDER;  Surgeon: GArmandina Gemma MD;  Location: [place]  Service: General;  Laterality: Left;  LMA   Family History: History reviewed. No pertinent family history. Social History:  Social History   Substance and Sexual Activity  Alcohol Use Not Currently   Comment: quit Oct 2019     Social History   Substance and Sexual Activity  Drug Use Not Currently   Types: Cocaine, IV, Heroin, MDMA (Ecstacy)   Comment: last reported use; cocaine and heroin in 2019; MDMA Feb 2023    Social History   Socioeconomic History   Marital status: Single    Spouse name: Not on file   Number of children: Not on file   Years of education: Not on file   Highest education level: Not on file  Occupational History   Not on file  Tobacco Use   Smoking status: Former    Types: Cigarettes   Smokeless tobacco: Current   Tobacco comments:  quit jan 2020  Vaping Use   Vaping Use: Every day  Substance and Sexual Activity   Alcohol use: Not Currently    Comment: quit Oct 2019   Drug use: Not Currently    Types: Cocaine, IV, Heroin, MDMA (Ecstacy)    Comment: last reported use; cocaine and heroin in 2019; MDMA Feb 2023   Sexual activity: Not Currently  Other Topics Concern   Not on file  Social History Narrative   Not on file   Social Determinants of Health   Financial Resource Strain: Not on file  Food Insecurity: Not on file  Transportation Needs: Not on file  Physical Activity: Not on file  Stress: Not on file   Social Connections: Not on file    Sleep: Fair  Appetite:  Good  Current Medications: Current Facility-Administered Medications  Medication Dose Route Frequency Provider Last Rate Last Admin   benztropine (COGENTIN) tablet 1 mg  1 mg Oral QHS Onuoha, Josephine C, NP   1 mg at 06/14/22 2143   buprenorphine (SUBUTEX) sublingual tablet 8 mg  8 mg Sublingual TID Charmaine Downs C, NP   8 mg at 06/15/22 1030   doxycycline (VIBRA-TABS) tablet 50 mg  50 mg Oral Daily Quincy Carnes M, PA-C   50 mg at 06/15/22 1031   gabapentin (NEURONTIN) capsule 400 mg  400 mg Oral BID Charmaine Downs C, NP   400 mg at 06/15/22 1031   haloperidol (HALDOL) tablet 10 mg  10 mg Oral QHS Onuoha, Josephine C, NP   10 mg at 06/14/22 2143   hydrOXYzine (ATARAX) tablet 25 mg  25 mg Oral TID PRN Charmaine Downs C, NP   25 mg at 06/14/22 2258   levothyroxine (SYNTHROID) tablet 150 mcg  150 mcg Oral Daily Larene Pickett, PA-C   150 mcg at 06/15/22 [id number]   tamsulosin (FLOMAX) capsule 0.4 mg  0.4 mg Oral QPM Larene Pickett, PA-C   0.4 mg at 06/14/22 [id number]   Current Outpatient Medications  Medication Sig Dispense Refill   buprenorphine (SUBUTEX) 8 MG SUBL SL tablet Place 8 mg under the tongue 3 (three) times daily.     diphenhydrAMINE (BENADRYL) 50 MG tablet Take 2-4 tablets q6h as needed for restlessness related to Haldol (Patient taking differently: Take 25 mg by mouth every 8 (eight) hours as needed (For restlessness per patient).) 100 tablet 2   doxycycline (VIBRAMYCIN) 50 MG capsule Take 1 capsule (50 mg total) by mouth daily. 90 capsule 1   gabapentin (NEURONTIN) 400 MG capsule Take 1 capsule (400 mg total) by mouth 2 (two) times daily for 180 doses. 180 capsule 0   haloperidol (HALDOL) 10 MG tablet Take 1 tablet (10 mg total) by mouth daily. (Patient taking differently: Take 10 mg by mouth at bedtime.) 90 tablet 0   hydrOXYzine (ATARAX) 25 MG tablet Take 25 mg by mouth in the morning and at bedtime.      levothyroxine (SYNTHROID) 150 MCG tablet Take 1 tablet (150 mcg total) by mouth daily. 30 tablet 11   modafinil (PROVIGIL) 200 MG tablet Take 600 mg by mouth daily.     nicotine (NICODERM CQ - DOSED IN MG/24 HOURS) 21 mg/24hr patch Place 21 mg onto the skin daily.     propranolol (INDERAL) 20 MG tablet Take 20 mg by mouth daily as needed (For blood pressure).     tamsulosin (FLOMAX) 0.4 MG CAPS capsule Take 0.4 mg by mouth every evening.      Lab  Results:  Results for orders placed or performed during the hospital encounter of 06/13/22 (from the past 48 hour(s))  Urine rapid drug screen (hosp performed)     Status: None   Collection Time: 06/13/22 12:45 PM  Result Value Ref Range   Opiates NONE DETECTED NONE DETECTED   Cocaine NONE DETECTED NONE DETECTED   Benzodiazepines NONE DETECTED NONE DETECTED   Amphetamines NONE DETECTED NONE DETECTED   Tetrahydrocannabinol NONE DETECTED NONE DETECTED   Barbiturates NONE DETECTED NONE DETECTED    Comment: (NOTE) DRUG SCREEN FOR MEDICAL PURPOSES ONLY.  IF CONFIRMATION IS NEEDED FOR ANY PURPOSE, NOTIFY LAB WITHIN 5 DAYS.  LOWEST DETECTABLE LIMITS FOR URINE DRUG SCREEN Drug Class                     Cutoff (ng/mL) Amphetamine and metabolites    1000 Barbiturate and metabolites    200 Benzodiazepine                 200 Opiates and metabolites        300 Cocaine and metabolites        300 THC                            50 Performed at [facility], [place] [address] [place], [place] [address]     Blood Alcohol level:  Lab Results  Component Value Date   [facility] <10 06/13/2022   ETH <10 05/25/2022   Psychiatric Specialty Exam:  Presentation  General Appearance:  Appropriate for Environment  Eye Contact: Good  Speech: Clear and Coherent  Speech Volume: Normal  Handedness: Right   Mood and Affect  Mood: Irritable  Affect: Congruent   Thought Process  Thought Processes: Coherent;  Linear  Descriptions of Associations:Intact  Orientation:Full (Time, Place and Person)  Thought Content:Logical  History of Schizophrenia/Schizoaffective disorder:Yes  Duration of Psychotic Symptoms:Greater than six months  Hallucinations:Hallucinations: None Description of Auditory Hallucinations: Hears different voices at different times  Ideas of Reference:None  Suicidal Thoughts:Suicidal Thoughts: No  Homicidal Thoughts:Homicidal Thoughts: No   Sensorium  Memory: Immediate Fair; Recent Fair  Judgment: Fair  Insight: Fair   Community education officer  Concentration: Good  Attention Span: Good  Recall: Good  Fund of Knowledge: Good  Language: Good   Psychomotor Activity  Psychomotor Activity: Psychomotor Activity: Normal   Assets  Assets: Leisure Time; Physical Health; Resilience; Social Support   Sleep  Sleep: Sleep: Good    Physical Exam: Physical Exam Neurological:     Mental Status: He is alert and oriented to person, place, and time.  Psychiatric:        Attention and Perception: Attention normal.        Mood and Affect: Affect is angry.        Speech: Speech normal.        Behavior: Behavior is agitated.        Thought Content: Thought content normal.    Review of Systems  Psychiatric/Behavioral:  Positive for depression, hallucinations and substance abuse.        Irritable  All other systems reviewed and are negative.  Blood pressure (!) 103/56, pulse 66, temperature 98.2 F (36.8 C), temperature source Oral, resp. rate 16, SpO2 97 %. There is no height or weight on file to calculate BMI.   Medical Decision Making: Pt case reviewed and discussed with Dr. Leverne Humbles. Will continue to monitor for improvements and  recommend inpatient psychiatric treatment at this time. No current bed availability at [facility] [place] notified to fax out.   - no medication changes at this time  [name], NP 06/15/2022, 12:06 PM

## 2022-06-15 NOTE — ED Notes (Signed)
Pt. Belonging have been moved to locker 35.

## 2022-06-15 NOTE — ED Notes (Signed)
Pt states he has been able to void multiple times throughout the night and is feeling some relief. Still refusing scan/foley

## 2022-06-16 DIAGNOSIS — F203 Undifferentiated schizophrenia: Secondary | ICD-10-CM | POA: Diagnosis not present

## 2022-06-16 LAB — URINALYSIS, ROUTINE W REFLEX MICROSCOPIC
Bilirubin Urine: NEGATIVE
Glucose, UA: NEGATIVE mg/dL
Hgb urine dipstick: NEGATIVE
Ketones, ur: NEGATIVE mg/dL
Leukocytes,Ua: NEGATIVE
Nitrite: NEGATIVE
Protein, ur: NEGATIVE mg/dL
Specific Gravity, Urine: 1.013 (ref 1.005–1.030)
pH: 6 (ref 5.0–8.0)

## 2022-06-16 MED ORDER — TAMSULOSIN HCL 0.4 MG PO CAPS
0.4000 mg | ORAL_CAPSULE | Freq: Every evening | ORAL | Status: DC
  Administered 2022-06-16: 0.4 mg via ORAL
  Filled 2022-06-16 (×3): qty 1

## 2022-06-16 MED ORDER — TAMSULOSIN HCL 0.4 MG PO CAPS
0.4000 mg | ORAL_CAPSULE | Freq: Once | ORAL | Status: AC
  Administered 2022-06-16: 0.4 mg via ORAL
  Filled 2022-06-16: qty 1

## 2022-06-16 MED ORDER — LIDOCAINE HCL URETHRAL/MUCOSAL 2 % EX GEL
1.0000 | Freq: Once | CUTANEOUS | Status: AC
  Administered 2022-06-16: 1 via URETHRAL
  Filled 2022-06-16: qty 11
  Filled 2022-06-16: qty 5

## 2022-06-16 NOTE — Progress Notes (Signed)
Per provider Delfin Gant, NP pt has been psych cleared. This CSW will now remove pt from the [facility] shift report.TOC will assist with any discharge needs.    Benjaman Kindler, MSW, [facility] 06/16/2022 4:49 PM

## 2022-06-16 NOTE — Discharge Summary (Addendum)
[facility] Psych ED Discharge  06/16/2022 3:37 PM Paul Bray  MRN:  [id number]  Principal Problem: Undifferentiated schizophrenia [facility]) Discharge Diagnoses: Principal Problem:   Undifferentiated schizophrenia ([place]) Active Problems:   Substance induced mood disorder ([place])   Opioid use disorder, moderate, on maintenance therapy (HCC)  Clinical Impression:  Final diagnoses:  Auditory hallucination   Subjective:  Paul Bray is a 57 y.o. male patient admitted with hx of Schizophrenia, ADHD, Substance abuse-Opiate Dependence on Maintenance  came in with complaint of Auditory hallucination hearing many voices saying derogatory things to him and about him.  Patient left [place] in [place] two weeks ago.  On arrival patient was restless, agitated moving around his stretcher and asking for his Surbitex.  Patient remains calm and cooperative but worried about urinary retention.  He denies SI/HI/AVH and no mention of Paranoia.  Patient will continue to receive Mental health care at [facility] Psychiatry.  He will continue to take his current medications.  Patient is Psychiatrically cleared and stable for Discharge.  ED Assessment Time Calculation: Start Time: 1452 Stop Time: 1512 Total Time in Minutes (Assessment Completion): 20   Past Psychiatric History: see initial Psychiatry evaluation note  Past Medical History:  Past Medical History:  Diagnosis Date   ADHD    Anxiety    Brain tumor ([place])    balance and occ memory issues and headaches   Brain tumor ([place])    [facility]   Depression    Headache    migraine and tension   Hypothyroidism    Soft tissue mass    left shoulder   Substance abuse ([place])     Past Surgical History:  Procedure Laterality Date   colonscopy     gamma knife radiation 2018 for brain tumor'     hematoma removed from arm Left    MASS EXCISION Left 06/30/2019   Procedure: EXCISION OF SOFT TISSUE  MASS LEFT SHOULDER;  Surgeon: Armandina Gemma, MD;  Location:  [place];  Service: General;  Laterality: Left;  LMA   Family History: History reviewed. No pertinent family history. Family Psychiatric  History: see initial Psychiatry evaluation note Social History:  Social History   Substance and Sexual Activity  Alcohol Use Not Currently   Comment: quit Oct 2019     Social History   Substance and Sexual Activity  Drug Use Not Currently   Types: Cocaine, IV, Heroin, MDMA (Ecstacy)   Comment: last reported use; cocaine and heroin in 2019; MDMA Feb 2023    Social History   Socioeconomic History   Marital status: Single    Spouse name: Not on file   Number of children: Not on file   Years of education: Not on file   Highest education level: Not on file  Occupational History   Not on file  Tobacco Use   Smoking status: Former    Types: Cigarettes   Smokeless tobacco: Current   Tobacco comments:    quit jan 2020  Vaping Use   Vaping Use: Every day  Substance and Sexual Activity   Alcohol use: Not Currently    Comment: quit Oct 2019   Drug use: Not Currently    Types: Cocaine, IV, Heroin, MDMA (Ecstacy)    Comment: last reported use; cocaine and heroin in 2019; MDMA Feb 2023   Sexual activity: Not Currently  Other Topics Concern   Not on file  Social History Narrative   Not on file  Social Determinants of Health   Financial Resource Strain: Not on file  Food Insecurity: Not on file  Transportation Needs: Not on file  Physical Activity: Not on file  Stress: Not on file  Social Connections: Not on file    Tobacco Cessation:  A prescription for an FDA-approved tobacco cessation medication was offered at discharge and the patient refused  Current Medications: Current Facility-Administered Medications  Medication Dose Route Frequency Provider Last Rate Last Admin   benztropine (COGENTIN) tablet 1 mg  1 mg Oral QHS Rasul Decola C, NP   1 mg at 06/15/22 2200   buprenorphine (SUBUTEX) sublingual tablet 8  mg  8 mg Sublingual TID Charmaine Downs C, NP   8 mg at 06/16/22 0932   doxycycline (VIBRA-TABS) tablet 50 mg  50 mg Oral Daily Larene Pickett, PA-C   50 mg at 06/16/22 0933   FLUoxetine (PROZAC) capsule 20 mg  20 mg Oral Daily Charmaine Downs C, NP   20 mg at 06/16/22 0932   gabapentin (NEURONTIN) capsule 400 mg  400 mg Oral BID Charmaine Downs C, NP   400 mg at 06/16/22 0932   haloperidol (HALDOL) tablet 10 mg  10 mg Oral QHS Carlyon Nolasco C, NP   10 mg at 06/15/22 2200   hydrOXYzine (ATARAX) tablet 25 mg  25 mg Oral TID PRN Charmaine Downs C, NP   25 mg at 06/14/22 2258   levothyroxine (SYNTHROID) tablet 150 mcg  150 mcg Oral Daily Larene Pickett, PA-C   150 mcg at 06/16/22 [id number]   nicotine (NICODERM CQ - dosed in mg/24 hours) patch 14 mg  14 mg Transdermal Daily Charmaine Downs C, NP   14 mg at 06/16/22 0930   tamsulosin (FLOMAX) capsule 0.4 mg  0.4 mg Oral QPM Lacretia Leigh, MD   0.4 mg at 06/16/22 1442   Current Outpatient Medications  Medication Sig Dispense Refill   buprenorphine (SUBUTEX) 8 MG SUBL SL tablet Place 8 mg under the tongue 3 (three) times daily.     diphenhydrAMINE (BENADRYL) 50 MG tablet Take 2-4 tablets q6h as needed for restlessness related to Haldol (Patient taking differently: Take 25 mg by mouth every 8 (eight) hours as needed (For restlessness per patient).) 100 tablet 2   doxycycline (VIBRAMYCIN) 50 MG capsule Take 1 capsule (50 mg total) by mouth daily. 90 capsule 1   gabapentin (NEURONTIN) 400 MG capsule Take 1 capsule (400 mg total) by mouth 2 (two) times daily for 180 doses. 180 capsule 0   haloperidol (HALDOL) 10 MG tablet Take 1 tablet (10 mg total) by mouth daily. (Patient taking differently: Take 10 mg by mouth at bedtime.) 90 tablet 0   hydrOXYzine (ATARAX) 25 MG tablet Take 25 mg by mouth in the morning and at bedtime.     levothyroxine (SYNTHROID) 150 MCG tablet Take 1 tablet (150 mcg total) by mouth daily. 30 tablet 11   modafinil  (PROVIGIL) 200 MG tablet Take 600 mg by mouth daily.     nicotine (NICODERM CQ - DOSED IN MG/24 HOURS) 21 mg/24hr patch Place 21 mg onto the skin daily.     propranolol (INDERAL) 20 MG tablet Take 20 mg by mouth daily as needed (For blood pressure).     tamsulosin (FLOMAX) 0.4 MG CAPS capsule Take 0.4 mg by mouth every evening.     PTA Medications: (Not in a hospital admission)   [place] Scale:  [place] ED from 06/13/2022 in [facility] Department at [facility]  [facility] ED from 05/25/2022 in [facility] Department at [facility] ED from 05/16/2022 in [facility] Emergency Department at [place] No Risk No Risk No Risk       Musculoskeletal: Strength & Muscle Tone: within normal limits Gait & Station: normal Patient leans: Front  Psychiatric Specialty Exam: Presentation  General Appearance:  Appropriate for Environment  Eye Contact: Good  Speech: Clear and Coherent  Speech Volume: Normal  Handedness: Right   Mood and Affect  Mood: Irritable  Affect: Congruent   Thought Process  Thought Processes: Coherent; Linear  Descriptions of Associations:Intact  Orientation:Full (Time, Place and Person)  Thought Content:Logical  History of Schizophrenia/Schizoaffective disorder:Yes  Duration of Psychotic Symptoms:Greater than six months  Hallucinations:Hallucinations: None  Ideas of Reference:None  Suicidal Thoughts:Suicidal Thoughts: No  Homicidal Thoughts:Homicidal Thoughts: No   Sensorium  Memory: Immediate Fair; Recent Fair  Judgment: Fair  Insight: Fair   Community education officer  Concentration: Good  Attention Span: Good  Recall: Good  Fund of Knowledge: Good  Language: Good   Psychomotor Activity  Psychomotor Activity: Psychomotor Activity: Normal   Assets  Assets: Leisure Time; Physical Health; Resilience; Social Support   Sleep  Sleep: Sleep:  Good    Physical Exam: Physical Exam Constitutional:      Appearance: Normal appearance.  HENT:     Head: Normocephalic and atraumatic.     Nose: Nose normal.  Cardiovascular:     Rate and Rhythm: Normal rate and regular rhythm.  Pulmonary:     Effort: Pulmonary effort is normal.  Musculoskeletal:        General: Normal range of motion.     Cervical back: Normal range of motion.  Skin:    General: Skin is warm and dry.  Neurological:     General: No focal deficit present.     Mental Status: He is alert and oriented to person, place, and time.    Review of Systems  Constitutional: Negative.   HENT: Negative.    Eyes: Negative.   Respiratory: Negative.    Cardiovascular: Negative.   Gastrointestinal: Negative.   Genitourinary:        Urine retention   Musculoskeletal: Negative.   Skin: Negative.   Neurological: Negative.   Endo/Heme/Allergies: Negative.   Psychiatric/Behavioral:  The patient is nervous/anxious.    Blood pressure (!) 167/76, pulse 80, temperature 98.2 F (36.8 C), temperature source Oral, resp. rate 20, SpO2 100 %. There is no height or weight on file to calculate BMI.   Demographic Factors:  Male  Loss Factors: NA  Historical Factors: NA  Risk Reduction Factors:   Living with another person, especially a relative and Positive social support  Continued Clinical Symptoms:  Schizophrenia:   Paranoid or undifferentiated type  Cognitive Features That Contribute To Risk:  None    Suicide Risk:  Minimal: No identifiable suicidal ideation.  Patients presenting with no risk factors but with morbid ruminations; may be classified as minimal risk based on the severity of the depressive symptoms    Plan Of Care/Follow-up recommendations:  Activity:  As tolerated Diet:  Regular  Medical Decision Making: Patient denies SI/HI/AVH.  Patient denies feeling depressed and states he is Psychiatrically stable.  Patient  will continue to get outpatient  treatment from [facility] with [facility]ry clinic.  He will continue with his medicatiopns as prescribed.  We reviewed safety plan-call [phone] or [phone] for mental health crisis including but not  limited to Suicide ideation.  Patient is advised to keep his outpatient Psychiatric care as scheduled. Problem 1: Undifferentiated Schizophrenia  Disposition: Psychiatrically Cleared.  Delfin Gant, NP-PMHNP-BC 06/16/2022, 3:37 PM

## 2022-06-16 NOTE — ED Notes (Signed)
Bladder Scan -  827 cc

## 2022-06-16 NOTE — Progress Notes (Signed)
[facility] Psych ED Progress Note  06/16/2022 3:28 PM Paul Bray  MRN:  [id number]   Subjective:  Paul Bray is a 57 y.o. male patient admitted with hx of Schizophrenia, ADHD, Substance abuse-Opiate Dependence on Maintenance  came in with complaint of Auditory hallucination hearing many voices saying derogatory things to him and about him.  Patient left [place] in [place] two weeks ago.  On arrival patient was restless, agitated moving around his stretcher and asking for his Surbitex.  Patient remains calm and cooperative but worried about urinary retention.  He denies SI/HI/AVH and no mention of Paranoia.  Patient will continue to receive Mental health care at [facility] Psychiatry.  He will continue to take his current medications.  Patient is Psychiatrically cleared and stable for Discharge. Principal Problem: Undifferentiated schizophrenia ([place]) Diagnosis:  Principal Problem:   Undifferentiated schizophrenia ([place]) Active Problems:   Substance induced mood disorder ([place])   Opioid use disorder, moderate, on maintenance therapy [facility])   ED Assessment Time Calculation: Start Time: [id number] Stop Time: 1510 Total Time in Minutes (Assessment Completion): 18   Past Psychiatric History: see initial psychiatry evaluation note  [place]:  [place] ED from 06/13/2022 in [facility] Emergency Department at [facility] ED from [facility]20[facility] Department at [facility] ED from 05/16/2022 in [facility] Emergency Department at [place] No Risk No Risk No Risk       Past Medical History:  Past Medical History:  Diagnosis Date   ADHD    Anxiety    Brain tumor ([place])    balance and occ memory issues and headaches   Brain tumor ([place])    sees [facility]   Depression    Headache    migraine and tension   Hypothyroidism    Soft tissue mass    left shoulder   Substance abuse [facility])     Past Surgical History:   Procedure Laterality Date   colonscopy     gamma knife radiation 2018 for brain tumor'     hematoma removed from arm Left    MASS EXCISION Left 06/30/2019   Procedure: EXCISION OF SOFT TISSUE  MASS LEFT SHOULDER;  Surgeon: Armandina Gemma, MD;  Location: [place];  Service: General;  Laterality: Left;  LMA   Family History: History reviewed. No pertinent family history. Family Psychiatric  History: See initial Psychiatry evaluation note Social History:  Social History   Substance and Sexual Activity  Alcohol Use Not Currently   Comment: quit Oct 2019     Social History   Substance and Sexual Activity  Drug Use Not Currently   Types: Cocaine, IV, Heroin, MDMA (Ecstacy)   Comment: last reported use; cocaine and heroin in 2019; MDMA Feb 2023    Social History   Socioeconomic History   Marital status: Single    Spouse name: Not on file   Number of children: Not on file   Years of education: Not on file   Highest education level: Not on file  Occupational History   Not on file  Tobacco Use   Smoking status: Former    Types: Cigarettes   Smokeless tobacco: Current   Tobacco comments:    quit jan 2020  Vaping Use   Vaping Use: Every day  Substance and Sexual Activity   Alcohol use: Not Currently    Comment: quit Oct 2019   Drug use: Not Currently  Types: Cocaine, IV, Heroin, MDMA (Ecstacy)    Comment: last reported use; cocaine and heroin in 2019; MDMA Feb 2023   Sexual activity: Not Currently  Other Topics Concern   Not on file  Social History Narrative   Not on file   Social Determinants of Health   Financial Resource Strain: Not on file  Food Insecurity: Not on file  Transportation Needs: Not on file  Physical Activity: Not on file  Stress: Not on file  Social Connections: Not on file    Sleep: Good  Appetite:  Fair  Current Medications: Current Facility-Administered Medications  Medication Dose Route Frequency Provider Last Rate Last  Admin   benztropine (COGENTIN) tablet 1 mg  1 mg Oral QHS Demesha Boorman C, NP   1 mg at 06/15/22 2200   buprenorphine (SUBUTEX) sublingual tablet 8 mg  8 mg Sublingual TID Charmaine Downs C, NP   8 mg at 06/16/22 0932   doxycycline (VIBRA-TABS) tablet 50 mg  50 mg Oral Daily Larene Pickett, PA-C   50 mg at 06/16/22 0933   FLUoxetine (PROZAC) capsule 20 mg  20 mg Oral Daily Charmaine Downs C, NP   20 mg at 06/16/22 0932   gabapentin (NEURONTIN) capsule 400 mg  400 mg Oral BID Charmaine Downs C, NP   400 mg at 06/16/22 0932   haloperidol (HALDOL) tablet 10 mg  10 mg Oral QHS Scharlene Catalina C, NP   10 mg at 06/15/22 2200   hydrOXYzine (ATARAX) tablet 25 mg  25 mg Oral TID PRN Charmaine Downs C, NP   25 mg at 06/14/22 2258   levothyroxine (SYNTHROID) tablet 150 mcg  150 mcg Oral Daily Larene Pickett, PA-C   150 mcg at 06/16/22 [id number]   nicotine (NICODERM CQ - dosed in mg/24 hours) patch 14 mg  14 mg Transdermal Daily Charmaine Downs C, NP   14 mg at 06/16/22 0930   tamsulosin (FLOMAX) capsule 0.4 mg  0.4 mg Oral QPM Lacretia Leigh, MD   0.4 mg at 06/16/22 1442   Current Outpatient Medications  Medication Sig Dispense Refill   buprenorphine (SUBUTEX) 8 MG SUBL SL tablet Place 8 mg under the tongue 3 (three) times daily.     diphenhydrAMINE (BENADRYL) 50 MG tablet Take 2-4 tablets q6h as needed for restlessness related to Haldol (Patient taking differently: Take 25 mg by mouth every 8 (eight) hours as needed (For restlessness per patient).) 100 tablet 2   doxycycline (VIBRAMYCIN) 50 MG capsule Take 1 capsule (50 mg total) by mouth daily. 90 capsule 1   gabapentin (NEURONTIN) 400 MG capsule Take 1 capsule (400 mg total) by mouth 2 (two) times daily for 180 doses. 180 capsule 0   haloperidol (HALDOL) 10 MG tablet Take 1 tablet (10 mg total) by mouth daily. (Patient taking differently: Take 10 mg by mouth at bedtime.) 90 tablet 0   hydrOXYzine (ATARAX) 25 MG tablet Take 25 mg by mouth in  the morning and at bedtime.     levothyroxine (SYNTHROID) 150 MCG tablet Take 1 tablet (150 mcg total) by mouth daily. 30 tablet 11   modafinil (PROVIGIL) 200 MG tablet Take 600 mg by mouth daily.     nicotine (NICODERM CQ - DOSED IN MG/24 HOURS) 21 mg/24hr patch Place 21 mg onto the skin daily.     propranolol (INDERAL) 20 MG tablet Take 20 mg by mouth daily as needed (For blood pressure).     tamsulosin (FLOMAX) 0.4 MG CAPS capsule Take  0.4 mg by mouth every evening.      Lab Results:  Results for orders placed or performed during the hospital encounter of 06/13/22 (from the past 48 hour(s))  Urinalysis, Routine w reflex microscopic -Urine, Catheterized     Status: None   Collection Time: 06/16/22  3:06 PM  Result Value Ref Range   Color, Urine YELLOW YELLOW   APPearance CLEAR CLEAR   Specific Gravity, Urine 1.013 1.005 - 1.030   pH 6.0 5.0 - 8.0   Glucose, UA NEGATIVE NEGATIVE mg/dL   Hgb urine dipstick NEGATIVE NEGATIVE   Bilirubin Urine NEGATIVE NEGATIVE   Ketones, ur NEGATIVE NEGATIVE mg/dL   Protein, ur NEGATIVE NEGATIVE mg/dL   Nitrite NEGATIVE NEGATIVE   Leukocytes,Ua NEGATIVE NEGATIVE    Comment: Performed at [facility], [place] [address] [place], [place] [address]    Blood Alcohol level:  Lab Results  Component Value Date   [facility] <10 06/13/2022   ETH <10 05/25/2022    Physical Findings:  CIWA:    COWS:     Musculoskeletal: Strength & Muscle Tone: within normal limits Gait & Station: normal Patient leans: Front  Psychiatric Specialty Exam:  Presentation  General Appearance:  Appropriate for Environment  Eye Contact: Good  Speech: Clear and Coherent  Speech Volume: Normal  Handedness: Right   Mood and Affect  Mood: Irritable  Affect: Congruent   Thought Process  Thought Processes: Coherent; Linear  Descriptions of Associations:Intact  Orientation:Full (Time, Place and Person)  Thought Content:Logical  History of  Schizophrenia/Schizoaffective disorder:Yes  Duration of Psychotic Symptoms:Greater than six months  Hallucinations:Hallucinations: None  Ideas of Reference:None  Suicidal Thoughts:Suicidal Thoughts: No  Homicidal Thoughts:Homicidal Thoughts: No   Sensorium  Memory: Immediate Fair; Recent Fair  Judgment: Fair  Insight: Fair   Community education officer  Concentration: Good  Attention Span: Good  Recall: Good  Fund of Knowledge: Good  Language: Good   Psychomotor Activity  Psychomotor Activity: Psychomotor Activity: Normal   Assets  Assets: Leisure Time; Physical Health; Resilience; Social Support   Sleep  Sleep: Sleep: Good    Physical Exam: Physical Exam Vitals and nursing note reviewed.  Constitutional:      Appearance: Normal appearance.  HENT:     Head: Normocephalic and atraumatic.     Nose: Nose normal.  Cardiovascular:     Rate and Rhythm: Normal rate.  Pulmonary:     Effort: Pulmonary effort is normal.  Musculoskeletal:        General: Normal range of motion.     Cervical back: Normal range of motion.  Skin:    General: Skin is warm and dry.  Neurological:     General: No focal deficit present.     Mental Status: He is alert and oriented to person, place, and time.    Review of Systems  Constitutional: Negative.   HENT: Negative.    Eyes: Negative.   Respiratory: Negative.    Cardiovascular: Negative.   Genitourinary:        New onset Urine retention.  Musculoskeletal: Negative.   Skin: Negative.   Neurological: Negative.   Endo/Heme/Allergies: Negative.    Blood pressure (!) 167/76, pulse 80, temperature 98.2 F (36.8 C), temperature source Oral, resp. rate 20, SpO2 100 %. There is no height or weight on file to calculate BMI.   Medical Decision Making: Patient denies SI/HI/AVH.  Patient denies feeling depressed and states he is Psychiatrically stable.  Patient  will continue to get outpatient treatment from  Kirby Funk with [facility] Psychiatry clinic.  He will continue with his medicatiopns as prescribed.  We reviewed safety plan-call 911 or [phone] for mental health crisis including but not limited to Suicide ideation.  Patient is advised to keep his outpatient Psychiatric care as scheduled. Problem 1: Undifferentiated Schizophrenia   Disposition:  Patient is Psychiatrically cleared. Delfin Gant, NP--PMHNP-BC 06/16/2022, 3:28 PM

## 2022-06-16 NOTE — Progress Notes (Signed)
CSW called [facility] and spoke with Intake who confirmed that referral was received. CSW was informed the referral is currently being reviewed. First shift CSW to follow up with [facility] [phone].    Benjaman Kindler, MSW, [facility] 06/16/2022 12:55 AM

## 2022-06-16 NOTE — Progress Notes (Signed)
Inpatient Behavioral Health Placement  Pt meets inpatient criteria per [name], NP. There are no available beds at [facility] per [facility]. CSW sent referral to [facility] as well as the other listed out of network providers:   Destination  Service Provider Address Phone Fax  [facility]  [place], [place] [address] [place]  [facility]  [address] [place] [address] [phone] [place]  [place], [place] [address] [phone]  [facility]  [address] [place] [address] [place]  [facility]  [address], [place] [address] [phone]  [place] [address], [place] [address] [phone]  [facility]  [place] [address] [phone]  [facility]  [address] [place] [address] [place]  [address], [place] [address] [phone]  [facility]  [address] [place] [address] [phone]  [facility]  [address] [place], [place] [address] [phone]  [facility]  [address], [place] [address] [phone] [place]  [address], [place] [phone] [place]  [facility]  [address] [place] [address] [phone]  [place]  [place], [place] [address] [phone]  [place] [address] [place] [address] [phone]  [facility]   [address], [place] [address] [phone]  [facility]  [address] [place] [address] [place]  [address], [place] [address] [phone] [facility]  [address] [place] [address] [place]  [facility]  [address], [place] [address] [phone] [place]  [address] [place] [address] [phone]  [facility] Office  [address], [place] [address] [phone]  [facility]  [place], [place] [address] [phone]  [facility]  [address] [place] [address] [phone]    Situation ongoing,  CSW will follow up.   Benjaman Kindler, MSW, [facility] 06/16/2022  @ 12:29 AM

## 2022-06-17 ENCOUNTER — Other Ambulatory Visit: Payer: Self-pay

## 2022-06-17 ENCOUNTER — Emergency Department (HOSPITAL_COMMUNITY): Payer: Medicare Other

## 2022-06-17 MED ORDER — TAMSULOSIN HCL 0.4 MG PO CAPS: 0.4000 mg | ORAL_CAPSULE | Freq: Every evening | ORAL | 0 refills | Status: DC

## 2022-06-17 NOTE — Progress Notes (Signed)
TOC consulted for "discharge resources." Resources provided and attached. Per chart review, pt is already connected to an outpatient mental health provider and discussed continuing services as planned with Psych NP. No further needs.

## 2022-06-17 NOTE — ED Notes (Signed)
Pt educated demonstrated proper care and emptying of foley catheter. Pt given extra supplies for foley cath. Pt verb understanding of these teachings. OK for discharge by md branman. Pt dressed himself, UAL with steady gait to waiting room with dc paperwork and his personal belongings in hand.

## 2022-06-17 NOTE — ED Notes (Signed)
Sleeping with no signs of distress or sleep disturbance respirations are easy as noted by the rise and fall of pt chest.

## 2022-06-17 NOTE — Discharge Instructions (Addendum)
Your ultrasound of your kidneys and bladder were reassuring and showed no abnormalities, follow the instructions listed in your discharge paperwork on how to care for your Foley catheter.  You will need to make an appointment with urology Dr. Tresa Moore listed in your discharge paperwork for follow-up for urinary challenge to make sure your catheter can be removed.  Keep taking the Flomax nightly as prescribed.  Come back if any issues with the Foley catheter, high fevers, worsening pain, inability to urinate.  Please follow up with your behavioral health provider as planned.   Substance Abuse Resources   [facility] - Admissions are currently completed Monday through Friday at 8 am; both appointments and walk-ins are accepted.  Any individual that is a [facility] resident may present for a substance abuse screening and assessment for admission.  A person may be referred by numerous sources or self-refer.   Potential clients will be screened for medical necessity and appropriateness for the program.  Clients must meet criteria for high-intensity residential treatment services.  If clinically appropriate, a client will continue with the comprehensive clinical assessment and intake process, as well as enrollment in the [place].   Address: [address] [place], [place] [address] Admin Hours: Mon-Fri 8AM to [place] Hours: 24/7 Phone: [phone] Fax: [phone]   [facility]Det[facility]) Facility Based Crisis:  These are 3 locations for services: Please call before arrival    Address: [address] [place], [place] [address] Phone: [phone]   Address: [address] [place], [place] [address] Phone#: [phone]   Address: [address] [place], [place] [address] Phone#: [phone]     [facility] ([facility]): (offers outpatient therapy and intensive outpatient substance abuse therapy).  [address]  [place], [place] [address] Phone: [phone]   [facility]: Offers FREE recovery skills classes, support groups, 1:1 Peer Support, and Compeer Classes. [address] [place], [place] [address] Phone: [phone] (Call to complete intake).  [facility] [address] [place], [place] [address] Phone: [phone] ext: [place] provides food, shelter and other programs and services to the homeless men of [place] through our [employer].   By offering safe shelter, three meals a day, clean clothing, Biblical counseling, financial planning, vocational training, GED/education and employment assistance, we've helped mend the shattered lives of many homeless men since opening in 1974.   We have approximately 267 beds available, with a max of 312 beds including mats for emergency situations and currently house an average of 270 men a night.   Prospective Client Check-In Information Photo ID Required (State/ Out of State/ [facility]) - if photo ID is not available, clients are required to have a printout of a police/sheriff's criminal history report. Help out with chores around the [place]. No sex offender of any type (pending, charged, registered and/or any other sex related offenses) will be permitted to check in. Must be willing to abide by all rules, regulations, and policies established by the [employer]. The following will be provided - shelter, food, clothing, and biblical counseling. If you or someone you know is in need of assistance at our [facility] shelter in [place], [place], please call [phone] ext. WW:2075573.   [facility]-will provide timely access to mental health services for children and adolescents (4-17) and adults presenting in a mental health crisis. The program  is designed for those who need urgent Behavioral Health or Substance Use treatment and are not  experiencing a medical crisis that would typically require an emergency room visit.    [place], [place] [address] Phone: [phone] [email or web address]: Phone#: [phone]   The [facility] SA Intensive Outpatient Program (SAIOP) means structured individual and group addiction activities and services that are provided at an outpatient program designed to assist adult and adolescent consumers to begin recovery and learn skills for recovery maintenance. The [place] program is offered at least 3 hours a day, 3 days a week.SAIOP services shall include a structured program consisting of, but not limited to, the following services: Individual counseling and support; Group counseling and support; Family counseling, training or support; Biochemical assays to identify recent drug use (e.g., urine drug screens); Strategies for relapse prevention to include community and social support systems in treatment; Life skills; Crisis contingency planning; Disease Management; and Treatment support activities that have been adapted or specifically designed for persons with physical disabilities, or persons with co-occurring disorders of mental illness and substance abuse/dependence or mental retardation/developmental disability and substance abuse/dependence. Phone: [phone]  Address:    [facility] will also offer the following outpatient services: (Monday through Friday 8am-5pm)   Partial Hospitalization Program (PHP) Substance Abuse Intensive Outpatient Program (SA-IOP) Group Therapy Medication Management Peer Living Room We also provide (24/7):  Assessments: Our mental health clinician and providers will conduct a focused mental health evaluation, assessing for immediate safety concerns and further mental health needs. Referral: Our team will provide resources and help connect to community based mental health  treatment, when indicated, including psychotherapy, psychiatry, and other specialized behavioral health or substance use disorder services (for those not already in treatment). Transitional Care: Our team providers in person bridging and/or telephonic follow-up during the patient's transition to outpatient services.    [facility] 24-Hour Call Center: [phone]   Behavioral Health Crisis Line: [phone]

## 2022-06-17 NOTE — ED Notes (Signed)
Remains asleep with no signs of distress or discomfort respirations are easy.

## 2022-06-17 NOTE — Progress Notes (Signed)
Transition of Care [facility]) - Emergency Department Mini Assessment   Patient Details  Name: Paul Bray MRN: [id number] Date of Birth: August 07, 1965  Transition of Care [facility]) CM/SW Contact:    Roseanne Kaufman, RN Phone Number: 06/17/2022, 12:58 PM   Clinical Narrative: Received secure chat requesting [facility] for new foley and urology follow up appointment. This RNCM notified multiple [place] agencies awaiting response. The following [place] agencies have declined: [place], [place], [place], [place]. This RNCM attempt to notify patient via phone, reached recording that voicemail box has not been set up. Patient was discharged prior to [facility] completion.  Patient will need to follow up with his PCP as no home health agency has accepted patient for [facility] services.   ED Mini Assessment: What brought you to the Emergency Department? 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   Nicotine dependence 10/04/2021   GAD (generalized anxiety disorder) 10/04/2021   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   Undifferentiated schizophrenia ([place]) 10/02/2021   Delusional disorder, persecutory type ([place]) 09/29/2021   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[place]) 08/20/2021   History of traumatic brain injury 08/18/2021   Low testosterone 07/10/2020   Vitamin D deficiency 07/10/2020   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    Nondependent alcohol abuse, in remission 06/19/2020   Encounter for monitoring Suboxone maintenance therapy 05/20/2020   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   Mass of soft tissue of shoulder 06/21/2019   Nose fracture 01/11/2018   Opioid use with withdrawal ([place]) 01/10/2018   Hypothyroidism 01/10/2018   Opioid use disorder, moderate, on maintenance therapy ([place]) [id number]   Uncomplicated opioid dependence ([place]) 12/24/2017   Cigarette nicotine dependence without complication [id number]   Substance induced mood disorder ([place]) 08/04/2017   Alcohol use disorder, severe, dependence ([place]) 02/22/2017   Bipolar and related disorder due to another medical condition with mixed features 01/20/2017   Acquired hypothyroidism 01/17/2017   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

## 2022-06-17 NOTE — ED Provider Notes (Signed)
Patient has been psychiatrically cleared and provided resources.  Renal ultrasound was obtained due to continued urinary retention and Foley placement while in ED which showed no acute findings.  Creatinine 1.26 only slightly elevated.  Provided urology follow-up, social work consulted to help arrange follow-up appointment and any needed home health.  Flomax prescription written.  No evidence of UTI on his urine.  He is cleared for discharge medically.   Elgie Congo, MD 06/17/22 1316

## 2022-06-17 NOTE — ED Provider Notes (Signed)
Emergency Medicine Observation Re-evaluation Note  Paul Bray is a 57 y.o. male, seen on rounds today.  Pt initially presented to the ED for complaints of Psychiatric Evaluation (/) Currently, the patient is resting comfortably awaiting placement for psychosis.  Physical Exam  BP 102/77 (BP Location: Left Arm)   Pulse 63   Temp 98.6 F (37 C) (Oral)   Resp 19   SpO2 94%  Physical Exam General: Resting comfortably Cardiac: Regular rate Lungs: Normal effort Psych: Calm  ED Course / MDM  EKG:EKG Interpretation  Date/Time:  Saturday June 13 2022 11:38:48 EST Ventricular Rate:  108 PR Interval:  151 QRS Duration: 86 QT Interval:  326 QTC Calculation: 437 R Axis:   22 Text Interpretation: Sinus tachycardia Consider anterior infarct Confirmed by Aletta Edouard [phone]) on 06/14/2022 10:33:23 AM  I have reviewed the labs performed to date as well as medications administered while in observation.  Recent changes in the last 24 hours include no acute events.  Plan  Current plan is for psychiatric clearance and discharge.    Elgie Congo, MD 06/17/22 [phone]

## 2022-06-17 NOTE — ED Notes (Signed)
Pt sitting up in bed eating breakfast

## 2022-06-17 NOTE — ED Notes (Signed)
Pt foley emptied.

## 2022-06-18 ENCOUNTER — Ambulatory Visit (HOSPITAL_COMMUNITY): Payer: Medicare Other | Admitting: Medical

## 2022-06-20 ENCOUNTER — Inpatient Hospital Stay (HOSPITAL_COMMUNITY)
Admission: EM | Admit: 2022-06-20 | Discharge: 2022-06-29 | DRG: 918 | Disposition: A | Discharge status: Home / Self Care | Payer: Medicare Other | Attending: Internal Medicine | Admitting: Internal Medicine

## 2022-06-20 ENCOUNTER — Emergency Department (HOSPITAL_COMMUNITY): Payer: Medicare Other

## 2022-06-20 ENCOUNTER — Encounter (HOSPITAL_COMMUNITY): Payer: Self-pay | Admitting: Emergency Medicine

## 2022-06-20 ENCOUNTER — Other Ambulatory Visit: Payer: Self-pay

## 2022-06-20 DIAGNOSIS — Z79899 Other long term (current) drug therapy: Secondary | ICD-10-CM

## 2022-06-20 DIAGNOSIS — L719 Rosacea, unspecified: Secondary | ICD-10-CM | POA: Diagnosis present

## 2022-06-20 DIAGNOSIS — E669 Obesity, unspecified: Secondary | ICD-10-CM | POA: Insufficient documentation

## 2022-06-20 DIAGNOSIS — F1120 Opioid dependence, uncomplicated: Secondary | ICD-10-CM | POA: Diagnosis present

## 2022-06-20 DIAGNOSIS — E872 Acidosis, unspecified: Secondary | ICD-10-CM | POA: Insufficient documentation

## 2022-06-20 DIAGNOSIS — Z85841 Personal history of malignant neoplasm of brain: Secondary | ICD-10-CM

## 2022-06-20 DIAGNOSIS — N401 Enlarged prostate with lower urinary tract symptoms: Secondary | ICD-10-CM | POA: Diagnosis present

## 2022-06-20 DIAGNOSIS — T50901A Poisoning by unspecified drugs, medicaments and biological substances, accidental (unintentional), initial encounter: Secondary | ICD-10-CM | POA: Diagnosis not present

## 2022-06-20 DIAGNOSIS — I959 Hypotension, unspecified: Secondary | ICD-10-CM

## 2022-06-20 DIAGNOSIS — F909 Attention-deficit hyperactivity disorder, unspecified type: Secondary | ICD-10-CM

## 2022-06-20 DIAGNOSIS — F1590 Other stimulant use, unspecified, uncomplicated: Secondary | ICD-10-CM | POA: Diagnosis present

## 2022-06-20 DIAGNOSIS — Z6830 Body mass index (BMI) 30.0-30.9, adult: Secondary | ICD-10-CM

## 2022-06-20 DIAGNOSIS — T447X2A Poisoning by beta-adrenoreceptor antagonists, intentional self-harm, initial encounter: Secondary | ICD-10-CM | POA: Diagnosis not present

## 2022-06-20 DIAGNOSIS — Z923 Personal history of irradiation: Secondary | ICD-10-CM

## 2022-06-20 DIAGNOSIS — Z5181 Encounter for therapeutic drug level monitoring: Secondary | ICD-10-CM

## 2022-06-20 DIAGNOSIS — F411 Generalized anxiety disorder: Secondary | ICD-10-CM | POA: Diagnosis present

## 2022-06-20 DIAGNOSIS — Z888 Allergy status to other drugs, medicaments and biological substances status: Secondary | ICD-10-CM

## 2022-06-20 DIAGNOSIS — F200 Paranoid schizophrenia: Secondary | ICD-10-CM | POA: Diagnosis present

## 2022-06-20 DIAGNOSIS — F419 Anxiety disorder, unspecified: Secondary | ICD-10-CM

## 2022-06-20 DIAGNOSIS — N179 Acute kidney failure, unspecified: Secondary | ICD-10-CM | POA: Insufficient documentation

## 2022-06-20 DIAGNOSIS — F203 Undifferentiated schizophrenia: Secondary | ICD-10-CM | POA: Diagnosis present

## 2022-06-20 DIAGNOSIS — F32A Depression, unspecified: Secondary | ICD-10-CM | POA: Diagnosis present

## 2022-06-20 DIAGNOSIS — T447X4A Poisoning by beta-adrenoreceptor antagonists, undetermined, initial encounter: Secondary | ICD-10-CM | POA: Diagnosis not present

## 2022-06-20 DIAGNOSIS — R338 Other retention of urine: Secondary | ICD-10-CM | POA: Diagnosis present

## 2022-06-20 DIAGNOSIS — I5022 Chronic systolic (congestive) heart failure: Secondary | ICD-10-CM | POA: Diagnosis present

## 2022-06-20 DIAGNOSIS — I110 Hypertensive heart disease with heart failure: Secondary | ICD-10-CM | POA: Diagnosis present

## 2022-06-20 DIAGNOSIS — E039 Hypothyroidism, unspecified: Secondary | ICD-10-CM | POA: Diagnosis present

## 2022-06-20 DIAGNOSIS — F17210 Nicotine dependence, cigarettes, uncomplicated: Secondary | ICD-10-CM | POA: Diagnosis present

## 2022-06-20 DIAGNOSIS — T447X1A Poisoning by beta-adrenoreceptor antagonists, accidental (unintentional), initial encounter: Secondary | ICD-10-CM | POA: Diagnosis present

## 2022-06-20 DIAGNOSIS — R440 Auditory hallucinations: Secondary | ICD-10-CM

## 2022-06-20 DIAGNOSIS — T447X5A Adverse effect of beta-adrenoreceptor antagonists, initial encounter: Secondary | ICD-10-CM

## 2022-06-20 DIAGNOSIS — Z79890 Hormone replacement therapy (postmenopausal): Secondary | ICD-10-CM

## 2022-06-20 DIAGNOSIS — E860 Dehydration: Secondary | ICD-10-CM | POA: Diagnosis present

## 2022-06-20 LAB — ACETAMINOPHEN LEVEL: Acetaminophen (Tylenol), Serum: <10 ug/mL — ABNORMAL LOW (ref 10–30)

## 2022-06-20 LAB — CBC
HCT: 44 % (ref 39.0–52.0)
Hemoglobin: 15.6 g/dL (ref 13.0–17.0)
MCH: 32.1 pg (ref 26.0–34.0)
MCHC: 35.5 g/dL (ref 30.0–36.0)
MCV: 90.5 fL (ref 80.0–100.0)
Platelets: 325 10*3/uL (ref 150–400)
RBC: 4.86 MIL/uL (ref 4.22–5.81)
RDW: 13.3 % (ref 11.5–15.5)
WBC: 10.3 10*3/uL (ref 4.0–10.5)
nRBC: 0 % (ref 0.0–0.2)

## 2022-06-20 LAB — COMPREHENSIVE METABOLIC PANEL
ALT: 27 U/L (ref 0–44)
AST: 27 U/L (ref 15–41)
Albumin: 4.4 g/dL (ref 3.5–5.0)
Alkaline Phosphatase: 74 U/L (ref 38–126)
Anion gap: 13 (ref 5–15)
BUN: 18 mg/dL (ref 6–20)
CO2: 24 mmol/L (ref 22–32)
Calcium: 9.3 mg/dL (ref 8.9–10.3)
Chloride: 99 mmol/L (ref 98–111)
Creatinine, Ser: 1.48 mg/dL — ABNORMAL HIGH (ref 0.61–1.24)
GFR, Estimated: 56 mL/min — ABNORMAL LOW (ref >60)
Glucose, Bld: 160 mg/dL — ABNORMAL HIGH (ref 70–99)
Potassium: 4.3 mmol/L (ref 3.5–5.1)
Sodium: 136 mmol/L (ref 135–145)
Total Bilirubin: 0.9 mg/dL (ref 0.3–1.2)
Total Protein: 7 g/dL (ref 6.5–8.1)

## 2022-06-20 LAB — SALICYLATE LEVEL: Salicylate Lvl: <7 mg/dL — ABNORMAL LOW (ref 7.0–30.0)

## 2022-06-20 LAB — ETHANOL: Alcohol, Ethyl (B): <10 mg/dL (ref <10)

## 2022-06-20 MED ORDER — SODIUM CHLORIDE 0.9 % IV BOLUS
1000.0000 mL | Freq: Once | INTRAVENOUS | Status: AC
  Administered 2022-06-21: 1000 mL via INTRAVENOUS

## 2022-06-20 NOTE — ED Notes (Signed)
Accidentally click off height and weight

## 2022-06-20 NOTE — ED Notes (Signed)
Sitter at bedside.

## 2022-06-20 NOTE — ED Triage Notes (Signed)
Pt c/o auditory hallucinations with suicidal thoughts without a plan. Pt denies chest pain, shortness of breath, or dizziness.

## 2022-06-20 NOTE — ED Provider Notes (Incomplete)
[facility] Provider Note   CSN: [id number] Arrival date & time: 06/20/22  2115     History {Add pertinent medical, surgical, social history, OB history to HPI:1} Chief Complaint  Patient presents with   Hallucinations    Paul Bray is a 57 y.o. male  HPI     Home Medications Prior to Admission medications   Medication Sig Start Date End Date Taking? Authorizing Provider  buprenorphine (SUBUTEX) 8 MG SUBL SL tablet Place 8 mg under the tongue 3 (three) times daily. 11/11/21   [provider]  diphenhydrAMINE (BENADRYL) 50 MG tablet Take 2-4 tablets q6h as needed for restlessness related to Haldol Patient taking differently: Take 25 mg by mouth every 8 (eight) hours as needed (For restlessness per patient). 06/11/22   Dara Hoyer, PA-C  doxycycline (VIBRAMYCIN) 50 MG capsule Take 1 capsule (50 mg total) by mouth daily. 12/25/21 06/23/22  Dara Hoyer, PA-C  gabapentin (NEURONTIN) 400 MG capsule Take 1 capsule (400 mg total) by mouth 2 (two) times daily for 180 doses. 06/11/22 09/09/22  Dara Hoyer, PA-C  haloperidol (HALDOL) 10 MG tablet Take 1 tablet (10 mg total) by mouth daily. Patient taking differently: Take 10 mg by mouth at bedtime. 06/11/22 09/09/22  Dara Hoyer, PA-C  hydrOXYzine (ATARAX) 25 MG tablet Take 25 mg by mouth in the morning and at bedtime.    [provider]  levothyroxine (SYNTHROID) 150 MCG tablet Take 1 tablet (150 mcg total) by mouth daily. 06/11/22 06/11/23  Dara Hoyer, PA-C  modafinil (PROVIGIL) 200 MG tablet Take 600 mg by mouth daily.    [provider]  nicotine (NICODERM CQ - DOSED IN MG/24 HOURS) 21 mg/24hr patch Place 21 mg onto the skin daily.    [provider]  propranolol (INDERAL) 20 MG tablet Take 20 mg by mouth daily as needed (For blood pressure). 11/03/21   [provider]  tamsulosin (FLOMAX) 0.4 MG CAPS capsule Take 1 capsule (0.4  mg total) by mouth at bedtime. 06/17/22 07/17/22  Elgie Congo, MD      Allergies    Corena Herter tosylate], Naloxone, and Amphetamine-dextroamphetamine    Review of Systems   Review of Systems  Constitutional: Negative.   HENT: Negative.    Eyes: Negative.   Respiratory: Negative.    Cardiovascular: Negative.   Neurological:  Positive for light-headedness. Negative for dizziness, tremors, facial asymmetry, weakness and headaches.  Psychiatric/Behavioral:  Positive for agitation, hallucinations and suicidal ideas. Negative for self-injury and sleep disturbance. The patient is not nervous/anxious and is not hyperactive.     Physical Exam Updated Vital Signs BP (!) 80/58 (BP Location: Right Arm)   Pulse 79   Temp 97.8 F (36.6 C)   Resp 16   Ht '5\' 10"'$  (1.778 m)   Wt 95.3 kg   SpO2 96%   BMI 30.13 kg/m  Physical Exam Vitals and nursing note reviewed.  Constitutional:      Appearance: He is not ill-appearing or toxic-appearing.  HENT:     Head: Normocephalic and atraumatic.     Mouth/Throat:     Mouth: Mucous membranes are moist.     Pharynx: No oropharyngeal exudate or posterior oropharyngeal erythema.  Eyes:     General:        Right eye: No discharge.        Left eye: No discharge.     Conjunctiva/sclera: Conjunctivae normal.  Cardiovascular:  Rate and Rhythm: Normal rate and regular rhythm.     Pulses: Normal pulses.     Heart sounds: Normal heart sounds. No murmur heard. Pulmonary:     Effort: Pulmonary effort is normal. No respiratory distress.     Breath sounds: Normal breath sounds. No wheezing or rales.  Abdominal:     General: Bowel sounds are normal. There is no distension.     Palpations: Abdomen is soft.     Tenderness: There is no abdominal tenderness. There is no guarding or rebound.  Musculoskeletal:        General: No deformity.     Cervical back: Neck supple.  Skin:    General: Skin is warm and dry.     Capillary Refill:  Capillary refill takes 2 to 3 seconds.  Neurological:     General: No focal deficit present.     Mental Status: He is alert and oriented to person, place, and time. Mental status is at baseline.  Psychiatric:        Attention and Perception: He perceives auditory hallucinations.        Mood and Affect: Mood is anxious.        Behavior: Behavior is agitated and hyperactive.        Thought Content: Thought content includes suicidal ideation. Thought content does not include homicidal ideation. Thought content does not include suicidal plan.     Comments: Does appear to be responding to internal stimuli     ED Results / Procedures / Treatments   Labs (all labs ordered are listed, but only abnormal results are displayed) Labs Reviewed  COMPREHENSIVE METABOLIC PANEL - Abnormal; Notable for the following components:      Result Value   Glucose, Bld 160 (*)    Creatinine, Ser 1.48 (*)    GFR, Estimated 56 (*)    All other components within normal limits  SALICYLATE LEVEL - Abnormal; Notable for the following components:   Salicylate Lvl [id number] (*)    All other components within normal limits  ACETAMINOPHEN LEVEL - Abnormal; Notable for the following components:   Acetaminophen (Tylenol), Serum <10 (*)    All other components within normal limits  CULTURE, BLOOD (SINGLE)  ETHANOL  CBC  RAPID URINE DRUG SCREEN, HOSP PERFORMED  LACTIC ACID, PLASMA  LACTIC ACID, PLASMA  PROTIME-INR  APTT  URINALYSIS, W/ REFLEX TO CULTURE (INFECTION SUSPECTED)    EKG None  Radiology No results found.  Procedures Procedures  {Document cardiac monitor, telemetry assessment procedure when appropriate:1}  Medications Ordered in ED Medications  sodium chloride 0.9 % bolus 1,000 mL (has no administration in time range)    ED Course/ Medical Decision Making/ A&P   {   Click here for ABCD2, HEART and other calculatorsREFRESH Note before signing :1}                          Medical Decision  Making Amount and/or Complexity of Data Reviewed Labs: ordered.   ***  {Document critical care time when appropriate:1} {Document review of labs and clinical decision tools ie heart score, Chads2Vasc2 etc:1}  {Document your independent review of radiology images, and any outside records:1} {Document your discussion with family members, caretakers, and with consultants:1} {Document social determinants of health affecting pt's care:1} {Document your decision making why or why not admission, treatments were needed:1} Final Clinical Impression(s) / ED Diagnoses Final diagnoses:  None    Rx / DC Orders ED Discharge  Orders     None

## 2022-06-20 NOTE — ED Notes (Signed)
Attempted IV x2 with failure, Theresia Lo RN will attempt [place] IV

## 2022-06-20 NOTE — ED Notes (Signed)
PA notified of low BP's and instructed this writer to chart both.

## 2022-06-21 ENCOUNTER — Other Ambulatory Visit (HOSPITAL_COMMUNITY): Payer: Medicare Other

## 2022-06-21 ENCOUNTER — Inpatient Hospital Stay (HOSPITAL_COMMUNITY): Payer: Medicare Other

## 2022-06-21 DIAGNOSIS — T50902A Poisoning by unspecified drugs, medicaments and biological substances, intentional self-harm, initial encounter: Secondary | ICD-10-CM | POA: Diagnosis not present

## 2022-06-21 DIAGNOSIS — E039 Hypothyroidism, unspecified: Secondary | ICD-10-CM

## 2022-06-21 DIAGNOSIS — F32A Depression, unspecified: Secondary | ICD-10-CM | POA: Diagnosis present

## 2022-06-21 DIAGNOSIS — E669 Obesity, unspecified: Secondary | ICD-10-CM | POA: Diagnosis present

## 2022-06-21 DIAGNOSIS — F411 Generalized anxiety disorder: Secondary | ICD-10-CM | POA: Diagnosis present

## 2022-06-21 DIAGNOSIS — E860 Dehydration: Secondary | ICD-10-CM | POA: Diagnosis present

## 2022-06-21 DIAGNOSIS — T50901A Poisoning by unspecified drugs, medicaments and biological substances, accidental (unintentional), initial encounter: Secondary | ICD-10-CM | POA: Diagnosis present

## 2022-06-21 DIAGNOSIS — I959 Hypotension, unspecified: Secondary | ICD-10-CM | POA: Diagnosis present

## 2022-06-21 DIAGNOSIS — T447X5A Adverse effect of beta-adrenoreceptor antagonists, initial encounter: Secondary | ICD-10-CM | POA: Diagnosis not present

## 2022-06-21 DIAGNOSIS — F209 Schizophrenia, unspecified: Secondary | ICD-10-CM | POA: Diagnosis not present

## 2022-06-21 DIAGNOSIS — Z923 Personal history of irradiation: Secondary | ICD-10-CM | POA: Diagnosis not present

## 2022-06-21 DIAGNOSIS — I110 Hypertensive heart disease with heart failure: Secondary | ICD-10-CM | POA: Diagnosis present

## 2022-06-21 DIAGNOSIS — F1120 Opioid dependence, uncomplicated: Secondary | ICD-10-CM | POA: Diagnosis present

## 2022-06-21 DIAGNOSIS — F200 Paranoid schizophrenia: Secondary | ICD-10-CM | POA: Diagnosis present

## 2022-06-21 DIAGNOSIS — F909 Attention-deficit hyperactivity disorder, unspecified type: Secondary | ICD-10-CM

## 2022-06-21 DIAGNOSIS — Z85841 Personal history of malignant neoplasm of brain: Secondary | ICD-10-CM | POA: Diagnosis not present

## 2022-06-21 DIAGNOSIS — F17210 Nicotine dependence, cigarettes, uncomplicated: Secondary | ICD-10-CM | POA: Diagnosis present

## 2022-06-21 DIAGNOSIS — N179 Acute kidney failure, unspecified: Secondary | ICD-10-CM

## 2022-06-21 DIAGNOSIS — Z888 Allergy status to other drugs, medicaments and biological substances status: Secondary | ICD-10-CM | POA: Diagnosis not present

## 2022-06-21 DIAGNOSIS — Z6830 Body mass index (BMI) 30.0-30.9, adult: Secondary | ICD-10-CM | POA: Diagnosis not present

## 2022-06-21 DIAGNOSIS — Z79899 Other long term (current) drug therapy: Secondary | ICD-10-CM | POA: Diagnosis not present

## 2022-06-21 DIAGNOSIS — T447X4A Poisoning by beta-adrenoreceptor antagonists, undetermined, initial encounter: Secondary | ICD-10-CM

## 2022-06-21 DIAGNOSIS — I5022 Chronic systolic (congestive) heart failure: Secondary | ICD-10-CM | POA: Diagnosis present

## 2022-06-21 DIAGNOSIS — F1590 Other stimulant use, unspecified, uncomplicated: Secondary | ICD-10-CM | POA: Diagnosis not present

## 2022-06-21 DIAGNOSIS — N401 Enlarged prostate with lower urinary tract symptoms: Secondary | ICD-10-CM | POA: Diagnosis present

## 2022-06-21 DIAGNOSIS — E872 Acidosis, unspecified: Secondary | ICD-10-CM | POA: Diagnosis not present

## 2022-06-21 DIAGNOSIS — T447X2A Poisoning by beta-adrenoreceptor antagonists, intentional self-harm, initial encounter: Secondary | ICD-10-CM | POA: Diagnosis present

## 2022-06-21 DIAGNOSIS — F203 Undifferentiated schizophrenia: Secondary | ICD-10-CM | POA: Diagnosis not present

## 2022-06-21 DIAGNOSIS — Z79890 Hormone replacement therapy (postmenopausal): Secondary | ICD-10-CM | POA: Diagnosis not present

## 2022-06-21 DIAGNOSIS — R338 Other retention of urine: Secondary | ICD-10-CM | POA: Diagnosis present

## 2022-06-21 DIAGNOSIS — T447X1A Poisoning by beta-adrenoreceptor antagonists, accidental (unintentional), initial encounter: Secondary | ICD-10-CM | POA: Diagnosis present

## 2022-06-21 DIAGNOSIS — R440 Auditory hallucinations: Secondary | ICD-10-CM

## 2022-06-21 DIAGNOSIS — I5021 Acute systolic (congestive) heart failure: Secondary | ICD-10-CM | POA: Diagnosis not present

## 2022-06-21 DIAGNOSIS — L719 Rosacea, unspecified: Secondary | ICD-10-CM | POA: Diagnosis present

## 2022-06-21 DIAGNOSIS — F419 Anxiety disorder, unspecified: Secondary | ICD-10-CM

## 2022-06-21 LAB — URINALYSIS, W/ REFLEX TO CULTURE (INFECTION SUSPECTED)
Bilirubin Urine: NEGATIVE
Glucose, UA: NEGATIVE mg/dL
Ketones, ur: NEGATIVE mg/dL
Nitrite: NEGATIVE
Protein, ur: 30 mg/dL — AB
Specific Gravity, Urine: 1.009 (ref 1.005–1.030)
pH: 6 (ref 5.0–8.0)

## 2022-06-21 LAB — CREATININE, SERUM
Creatinine, Ser: 1.17 mg/dL (ref 0.61–1.24)
GFR, Estimated: >60 mL/min (ref >60)

## 2022-06-21 LAB — CBC
HCT: 35.6 % — ABNORMAL LOW (ref 39.0–52.0)
Hemoglobin: 12.2 g/dL — ABNORMAL LOW (ref 13.0–17.0)
MCH: 31.8 pg (ref 26.0–34.0)
MCHC: 34.3 g/dL (ref 30.0–36.0)
MCV: 92.7 fL (ref 80.0–100.0)
Platelets: 207 10*3/uL (ref 150–400)
RBC: 3.84 MIL/uL — ABNORMAL LOW (ref 4.22–5.81)
RDW: 13.7 % (ref 11.5–15.5)
WBC: 7.7 10*3/uL (ref 4.0–10.5)
nRBC: 0 % (ref 0.0–0.2)

## 2022-06-21 LAB — LACTIC ACID, PLASMA
Lactic Acid, Venous: 0.9 mmol/L (ref 0.5–1.9)
Lactic Acid, Venous: 0.9 mmol/L (ref 0.5–1.9)
Lactic Acid, Venous: 2.3 mmol/L (ref 0.5–1.9)

## 2022-06-21 LAB — TROPONIN I (HIGH SENSITIVITY)
Troponin I (High Sensitivity): 3 ng/L (ref <18)
Troponin I (High Sensitivity): 4 ng/L (ref <18)

## 2022-06-21 LAB — PROTIME-INR
INR: 1 (ref 0.8–1.2)
Prothrombin Time: 13.1 seconds (ref 11.4–15.2)

## 2022-06-21 LAB — MRSA NEXT GEN BY PCR, NASAL: MRSA by PCR Next Gen: NOT DETECTED

## 2022-06-21 LAB — APTT: aPTT: 22 seconds — ABNORMAL LOW (ref 24–36)

## 2022-06-21 MED ORDER — HYDROXYZINE HCL 25 MG PO TABS
25.0000 mg | ORAL_TABLET | Freq: Two times a day (BID) | ORAL | Status: DC
  Administered 2022-06-21 – 2022-06-29 (×15): 25 mg via ORAL
  Filled 2022-06-21 (×18): qty 1

## 2022-06-21 MED ORDER — CHLORHEXIDINE GLUCONATE CLOTH 2 % EX PADS
6.0000 | MEDICATED_PAD | Freq: Every day | CUTANEOUS | Status: DC
  Administered 2022-06-21 – 2022-06-29 (×9): 6 via TOPICAL

## 2022-06-21 MED ORDER — PROMETHAZINE HCL 25 MG PO TABS: 12.5000 mg | ORAL_TABLET | Freq: Four times a day (QID) | ORAL | Status: DC | PRN

## 2022-06-21 MED ORDER — ACETAMINOPHEN 325 MG PO TABS: 650.0000 mg | ORAL_TABLET | Freq: Four times a day (QID) | ORAL | Status: DC | PRN

## 2022-06-21 MED ORDER — TAMSULOSIN HCL 0.4 MG PO CAPS
0.4000 mg | ORAL_CAPSULE | Freq: Every day | ORAL | Status: DC
  Administered 2022-06-21 – 2022-06-22 (×2): 0.4 mg via ORAL
  Filled 2022-06-21 (×2): qty 1

## 2022-06-21 MED ORDER — ACETAMINOPHEN 650 MG RE SUPP: 650.0000 mg | Freq: Four times a day (QID) | RECTAL | Status: DC | PRN

## 2022-06-21 MED ORDER — MODAFINIL 100 MG PO TABS
600.0000 mg | ORAL_TABLET | Freq: Every day | ORAL | Status: DC
  Administered 2022-06-22: 600 mg via ORAL
  Filled 2022-06-21: qty 6

## 2022-06-21 MED ORDER — LEVOTHYROXINE SODIUM 75 MCG PO TABS
150.0000 ug | ORAL_TABLET | Freq: Every day | ORAL | Status: DC
  Administered 2022-06-21 – 2022-06-29 (×9): 150 ug via ORAL
  Filled 2022-06-21 (×9): qty 2

## 2022-06-21 MED ORDER — BUPRENORPHINE HCL 8 MG SL SUBL
8.0000 mg | SUBLINGUAL_TABLET | Freq: Three times a day (TID) | SUBLINGUAL | Status: DC
  Administered 2022-06-21 – 2022-06-29 (×24): 8 mg via SUBLINGUAL
  Filled 2022-06-21 (×6): qty 1
  Filled 2022-06-21 (×3): qty 4
  Filled 2022-06-21 (×7): qty 1
  Filled 2022-06-21 (×2): qty 4
  Filled 2022-06-21: qty 1
  Filled 2022-06-21 (×2): qty 4
  Filled 2022-06-21 (×2): qty 1
  Filled 2022-06-21: qty 4
  Filled 2022-06-21: qty 1

## 2022-06-21 MED ORDER — DIPHENHYDRAMINE HCL 25 MG PO CAPS
25.0000 mg | ORAL_CAPSULE | Freq: Three times a day (TID) | ORAL | Status: DC | PRN
  Administered 2022-06-27 (×2): 25 mg via ORAL
  Filled 2022-06-21 (×2): qty 1

## 2022-06-21 MED ORDER — BUPRENORPHINE HCL 8 MG SL SUBL: 8.0000 mg | SUBLINGUAL_TABLET | Freq: Three times a day (TID) | SUBLINGUAL | Status: DC

## 2022-06-21 MED ORDER — GLUCAGON HCL RDNA (DIAGNOSTIC) 1 MG IJ SOLR
1.0000 mg/h | INTRAVENOUS | Status: DC
  Administered 2022-06-21 – 2022-06-22 (×5): 1 mg/h via INTRAVENOUS
  Filled 2022-06-21 (×9): qty 5

## 2022-06-21 MED ORDER — GLUCAGON HCL RDNA (DIAGNOSTIC) 1 MG IJ SOLR
3.0000 mg | Freq: Once | INTRAMUSCULAR | Status: AC
  Administered 2022-06-21: 3 mg via INTRAVENOUS

## 2022-06-21 MED ORDER — ALUM & MAG HYDROXIDE-SIMETH 200-200-20 MG/5ML PO SUSP
30.0000 mL | Freq: Once | ORAL | Status: AC
  Administered 2022-06-21: 30 mL via ORAL
  Filled 2022-06-21: qty 30

## 2022-06-21 MED ORDER — LACTATED RINGERS IV BOLUS: 1000.0000 mL | Freq: Once | INTRAVENOUS | Status: DC

## 2022-06-21 MED ORDER — HALOPERIDOL 5 MG PO TABS
10.0000 mg | ORAL_TABLET | Freq: Every day | ORAL | Status: DC
  Administered 2022-06-21: 10 mg via ORAL
  Filled 2022-06-21 (×2): qty 2

## 2022-06-21 MED ORDER — ACETAMINOPHEN 325 MG PO TABS
650.0000 mg | ORAL_TABLET | Freq: Four times a day (QID) | ORAL | Status: DC | PRN
  Administered 2022-06-21 – 2022-06-24 (×3): 650 mg via ORAL
  Filled 2022-06-21 (×3): qty 2

## 2022-06-21 MED ORDER — ENOXAPARIN SODIUM 40 MG/0.4ML IJ SOSY
40.0000 mg | PREFILLED_SYRINGE | INTRAMUSCULAR | Status: DC
  Administered 2022-06-21 – 2022-06-28 (×8): 40 mg via SUBCUTANEOUS
  Filled 2022-06-21 (×8): qty 0.4

## 2022-06-21 MED ORDER — SODIUM CHLORIDE 0.9 % IV BOLUS
1000.0000 mL | Freq: Once | INTRAVENOUS | Status: AC
  Administered 2022-06-21: 1000 mL via INTRAVENOUS

## 2022-06-21 MED ORDER — ONDANSETRON HCL 4 MG/2ML IJ SOLN
4.0000 mg | Freq: Once | INTRAMUSCULAR | Status: AC
  Administered 2022-06-21: 4 mg via INTRAVENOUS
  Filled 2022-06-21: qty 2

## 2022-06-21 MED ORDER — MODAFINIL 100 MG PO TABS: 200.0000 mg | ORAL_TABLET | Freq: Every day | ORAL | Status: DC

## 2022-06-21 MED ORDER — GABAPENTIN 400 MG PO CAPS
400.0000 mg | ORAL_CAPSULE | Freq: Two times a day (BID) | ORAL | Status: DC
  Administered 2022-06-21 – 2022-06-29 (×17): 400 mg via ORAL
  Filled 2022-06-21 (×17): qty 1

## 2022-06-21 MED ORDER — SODIUM CHLORIDE 0.9 % IV SOLN: INTRAVENOUS | Status: DC

## 2022-06-21 MED ORDER — PANTOPRAZOLE SODIUM 40 MG PO TBEC
40.0000 mg | DELAYED_RELEASE_TABLET | Freq: Every day | ORAL | Status: DC
  Administered 2022-06-21 – 2022-06-29 (×9): 40 mg via ORAL
  Filled 2022-06-21 (×9): qty 1

## 2022-06-21 MED ORDER — NICOTINE 21 MG/24HR TD PT24
21.0000 mg | MEDICATED_PATCH | Freq: Every day | TRANSDERMAL | Status: DC
  Administered 2022-06-21 – 2022-06-29 (×9): 21 mg via TRANSDERMAL
  Filled 2022-06-21 (×9): qty 1

## 2022-06-21 MED ORDER — HYDROXYZINE HCL 25 MG PO TABS
25.0000 mg | ORAL_TABLET | Freq: Once | ORAL | Status: AC
  Administered 2022-06-21: 25 mg via ORAL
  Filled 2022-06-21: qty 1

## 2022-06-21 MED ORDER — SODIUM CHLORIDE 0.9 % IV BOLUS: 1000.0000 mL | Freq: Once | INTRAVENOUS | Status: DC

## 2022-06-21 MED ORDER — DOXYCYCLINE HYCLATE 50 MG PO CAPS
50.0000 mg | ORAL_CAPSULE | Freq: Every day | ORAL | Status: DC
  Administered 2022-06-21 – 2022-06-29 (×9): 50 mg via ORAL
  Filled 2022-06-21 (×10): qty 1

## 2022-06-21 MED ORDER — LIDOCAINE VISCOUS HCL 2 % MT SOLN
15.0000 mL | Freq: Once | OROMUCOSAL | Status: AC
  Administered 2022-06-21: 15 mL via ORAL
  Filled 2022-06-21: qty 15

## 2022-06-21 MED ORDER — CALCIUM GLUCONATE-NACL 1-0.675 GM/50ML-% IV SOLN
1.0000 g | Freq: Once | INTRAVENOUS | Status: AC
  Administered 2022-06-21: 1000 mg via INTRAVENOUS
  Filled 2022-06-21: qty 50

## 2022-06-21 MED ORDER — MODAFINIL 100 MG PO TABS
600.0000 mg | ORAL_TABLET | Freq: Every day | ORAL | Status: DC
  Administered 2022-06-21: 600 mg via ORAL
  Filled 2022-06-21: qty 6

## 2022-06-21 NOTE — H&P (Signed)
History and Physical    Paul Bray [id number] DOB: 10/08/1965 DOA: 06/20/2022  PCP: Jola Baptist, PA-C Patient coming from: home  Chief Complaint: drug OD  HPI: Paul Bray is a 57 y.o. male with medical history significant of schizophrenia with recurrent auditory hallucinations, hypothyroidism, anxiety, depression, ADHD, previous polysubstance abuse (IV heroin, cocaine, MDMA), primary malignant neoplasm of the cerebral ventricle, status post gamma knife radiation in 2018, who presented with propranolol overdose.  Per chart review, patient reported to ED provider that " he is connected to "the human frequency", states that people are haunting him this way. Endorses SI but denies plan or HI".  Per [facility] night shift note, it was noted that "he has been recently hearing voices in his head more frequently, and that yesterday these voices instructed him to take extra of his home propranolol".  It was reported that he took at least 3 propranolol tablets at home. The specific timing of taking the additional propranolol is not entirely clear.    However, he denied any hallucination or hearing voices to me during my interview this morning.  He told me that he took 3 tablets of propranolol because his "blood pressure was high".  He states that he " ran out of the house and walked on street last night, and police called the EMS to bring me here".  He became agitated and hostile at times during my interview with him.  In the ED, Patient received saline bolus 2 L and was started on glucagon drip after ED provider talked to poison control last night.  Review of Systems: As per HPI otherwise 10 point review of systems negative.  Review of Systems Positive for propranolol overdose Patient unable to complete review of system due to his hallucination and mental status change.  Past Medical History:  Diagnosis Date   ADHD    Anxiety    Brain tumor ([place])    balance and occ memory  issues and headaches   Brain tumor ([place])    sees [facility] q year   Depression    Headache    migraine and tension   Hypothyroidism    Soft tissue mass    left shoulder   Substance abuse ([place])     Past Surgical History:  Procedure Laterality Date   colonscopy     gamma knife radiation 2018 for brain tumor'     hematoma removed from arm Left    MASS EXCISION Left 06/30/2019   Procedure: EXCISION OF SOFT TISSUE  MASS LEFT SHOULDER;  Surgeon: Armandina Gemma, MD;  Location: [facility];  Service: General;  Laterality: Left;  LMA    SOCIAL HISTORY:  reports that he has quit smoking. His smoking use included cigarettes. He uses smokeless tobacco. He reports that he does not currently use alcohol. He reports that he does not currently use drugs after having used the following drugs: Cocaine, IV, Heroin, and MDMA (Ecstacy).  Allergies  Allergen Reactions   Ingrezza [Valbenazine Tosylate] Other (See Comments)    Severe tongue chewing at 80 mg Tolerates lower doses   Naloxone Palpitations    Pt report MAT Rx is Subutex/Buprenorphine only   Amphetamine-Dextroamphetamine Other (See Comments)    unknown    FAMILY HISTORY: History reviewed. No pertinent family history.   Prior to Admission medications   Medication Sig Start Date End Date Taking? Authorizing Provider  buprenorphine (SUBUTEX) 8 MG SUBL SL tablet Place 8 mg under the tongue 3 (  three) times daily. 11/11/21  Yes [provider]  diphenhydrAMINE (BENADRYL) 50 MG tablet Take 2-4 tablets q6h as needed for restlessness related to Haldol Patient taking differently: Take 25 mg by mouth every 8 (eight) hours as needed (For restlessness per patient). 06/11/22  Yes Dara Hoyer, PA-C  doxycycline (VIBRAMYCIN) 50 MG capsule Take 1 capsule (50 mg total) by mouth daily. 12/25/21 06/23/22 Yes Dara Hoyer, PA-C  gabapentin (NEURONTIN) 400 MG capsule Take 1 capsule (400 mg total) by mouth 2 (two) times daily for  180 doses. 06/11/22 09/09/22 Yes Dara Hoyer, PA-C  haloperidol (HALDOL) 10 MG tablet Take 1 tablet (10 mg total) by mouth daily. Patient taking differently: Take 10 mg by mouth at bedtime. 06/11/22 09/09/22 Yes Dara Hoyer, PA-C  hydrOXYzine (ATARAX) 25 MG tablet Take 25 mg by mouth in the morning and at bedtime.   Yes [provider]  levothyroxine (SYNTHROID) 150 MCG tablet Take 1 tablet (150 mcg total) by mouth daily. 06/11/22 06/11/23 Yes Dara Hoyer, PA-C  modafinil (PROVIGIL) 200 MG tablet Take 600 mg by mouth daily.   Yes [provider]  nicotine (NICODERM CQ - DOSED IN MG/24 HOURS) 21 mg/24hr patch Place 21 mg onto the skin daily.   Yes [provider]  propranolol (INDERAL) 20 MG tablet Take 20 mg by mouth daily as needed (For blood pressure). 11/03/21  Yes [provider]  tamsulosin (FLOMAX) 0.4 MG CAPS capsule Take 1 capsule (0.4 mg total) by mouth at bedtime. 06/17/22 07/17/22 Yes Elgie Congo, MD    Physical Exam: Vitals:   06/21/22 0830 06/21/22 0859 06/21/22 0900 06/21/22 1000  BP: 107/74  102/77 96/66  Pulse: 61  (!) 54 62  Resp: '12  16 16  '$ Temp:  97.8 F (36.6 C)    TempSrc:      SpO2: 95%  97% 96%  Weight:      Height:          Constitutional: NAD, anxious, agitated at times, Eyes: PERRL, lids and conjunctivae normal ENMT: Mucous membranes are moist. Posterior pharynx clear of any exudate or lesions.Normal dentition.  Neck: normal, supple, no masses, no thyromegaly Respiratory: clear to auscultation bilaterally, no wheezing, no crackles. Normal respiratory effort. No accessory muscle use.  Cardiovascular: Regular rate and rhythm, no murmurs / rubs / gallops. No extremity edema. 2+ pedal pulses. No carotid bruits.  Abdomen: no tenderness, no masses palpated. No hepatosplenomegaly. Bowel sounds positive.  Musculoskeletal: no clubbing / cyanosis. No joint deformity upper and lower extremities. Good ROM, no  contractures. Normal muscle tone.  Skin: no rashes, lesions, ulcers. No induration Neurologic: Not cooperative with neuroexam Psychiatric: Alert and oriented to self and place.  He reports this year is 46.  Agitated and anxious at times   Labs on Admission: I have personally reviewed following labs and imaging studies  CBC: Recent Labs  Lab 06/20/22 2140  WBC 10.3  HGB 15.6  HCT 44.0  MCV 90.5  PLT [id number]   Basic Metabolic Panel: Recent Labs  Lab 06/20/22 2140  Emerick Weatherly 136  K 4.3  CL 99  CO2 24  GLUCOSE 160*  BUN 18  CREATININE 1.48*  CALCIUM 9.3   GFR: Estimated Creatinine Clearance: 66.1 mL/min (A) (by C-G formula based on SCr of 1.48 mg/dL (H)). Liver Function Tests: Recent Labs  Lab 06/20/22 2140  AST 27  ALT 27  ALKPHOS 74  BILITOT 0.9  PROT 7.0  ALBUMIN 4.4  No results for input(s): "LIPASE", "AMYLASE" in the last 168 hours. No results for input(s): "AMMONIA" in the last 168 hours. Coagulation Profile: Recent Labs  Lab 06/20/22 2351  INR 1.0   Cardiac Enzymes: No results for input(s): "CKTOTAL", "CKMB", "CKMBINDEX", "TROPONINI" in the last 168 hours. BNP (last 3 results) No results for input(s): "PROBNP" in the last 8760 hours. HbA1C: No results for input(s): "HGBA1C" in the last 72 hours. CBG: No results for input(s): "GLUCAP" in the last 168 hours. Lipid Profile: No results for input(s): "CHOL", "HDL", "LDLCALC", "TRIG", "CHOLHDL", "LDLDIRECT" in the last 72 hours. Thyroid Function Tests: No results for input(s): "TSH", "T4TOTAL", "FREET4", "T3FREE", "THYROIDAB" in the last 72 hours. Anemia Panel: No results for input(s): "VITAMINB12", "FOLATE", "FERRITIN", "TIBC", "IRON", "RETICCTPCT" in the last 72 hours. Urine analysis:    Component Value Date/Time   COLORURINE YELLOW 06/21/2022 0128   APPEARANCEUR HAZY (A) 06/21/2022 0128   LABSPEC 1.009 06/21/2022 0128   PHURINE 6.0 06/21/2022 0128   GLUCOSEU NEGATIVE 06/21/2022 0128   HGBUR MODERATE  (A) 06/21/2022 0128   BILIRUBINUR NEGATIVE 06/21/2022 0128   KETONESUR NEGATIVE 06/21/2022 0128   PROTEINUR 30 (A) 06/21/2022 0128   UROBILINOGEN 1.0 02/07/2011 2021   NITRITE NEGATIVE 06/21/2022 0128   LEUKOCYTESUR MODERATE (A) 06/21/2022 0128   Sepsis Labs: !!!!!!!!!!!!!!!!!!!!!!!!!!!!!!!!!!!!!!!!!!!! '@LABRCNTIP'$ (procalcitonin:4,lacticidven:4) ) Recent Results (from the past 240 hour(s))  Resp panel by RT-PCR (RSV, Flu A&B, Covid) Anterior Nasal Swab     Status: None   Collection Time: 06/13/22 11:29 AM   Specimen: Anterior Nasal Swab  Result Value Ref Range Status   SARS Coronavirus 2 by RT PCR NEGATIVE NEGATIVE Final    Comment: (NOTE) SARS-CoV-2 target nucleic acids are NOT DETECTED.  The SARS-CoV-2 RNA is generally detectable in upper respiratory specimens during the acute phase of infection. The lowest concentration of SARS-CoV-2 viral copies this assay can detect is 138 copies/mL. A negative result does not preclude SARS-Cov-2 infection and should not be used as the sole basis for treatment or other patient management decisions. A negative result may occur with  improper specimen collection/handling, submission of specimen other than nasopharyngeal swab, presence of viral mutation(s) within the areas targeted by this assay, and inadequate number of viral copies(<138 copies/mL). A negative result must be combined with clinical observations, patient history, and epidemiological information. The expected result is Negative.  Fact Sheet for Patients:  EntrepreneurPulse.com.au  Fact Sheet for Healthcare Providers:  IncredibleEmployment.be  This test is no t yet approved or cleared by the [place] FDA and  has been authorized for detection and/or diagnosis of SARS-CoV-2 by FDA under an Emergency Use Authorization (EUA). This EUA will remain  in effect (meaning this test can be used) for the duration of the COVID-19 declaration under  Section 564(b)(1) of the Act, 21 U.S.C.section 360bbb-3(b)(1), unless the authorization is terminated  or revoked sooner.       Influenza A by PCR NEGATIVE NEGATIVE Final   Influenza B by PCR NEGATIVE NEGATIVE Final    Comment: (NOTE) The Xpert Xpress SARS-CoV-2/FLU/RSV plus assay is intended as an aid in the diagnosis of influenza from Nasopharyngeal swab specimens and should not be used as a sole basis for treatment. Nasal washings and aspirates are unacceptable for Xpert Xpress SARS-CoV-2/FLU/RSV testing.  Fact Sheet for Patients: EntrepreneurPulse.com.au  Fact Sheet for Healthcare Providers: IncredibleEmployment.be  This test is not yet approved or cleared by the [place] FDA and has been authorized for detection and/or diagnosis of SARS-CoV-2 by FDA  under an Emergency Use Authorization (EUA). This EUA will remain in effect (meaning this test can be used) for the duration of the COVID-19 declaration under Section 564(b)(1) of the Act, 21 U.S.C. section 360bbb-3(b)(1), unless the authorization is terminated or revoked.     Resp Syncytial Virus by PCR NEGATIVE NEGATIVE Final    Comment: (NOTE) Fact Sheet for Patients: EntrepreneurPulse.com.au  Fact Sheet for Healthcare Providers: IncredibleEmployment.be  This test is not yet approved or cleared by the [place] FDA and has been authorized for detection and/or diagnosis of SARS-CoV-2 by FDA under an Emergency Use Authorization (EUA). This EUA will remain in effect (meaning this test can be used) for the duration of the COVID-19 declaration under Section 564(b)(1) of the Act, 21 U.S.C. section 360bbb-3(b)(1), unless the authorization is terminated or revoked.  Performed at [facility], [place] [address], [place], [place] [address]   Blood Culture (routine single)     Status: None (Preliminary result)   Collection Time:  06/20/22 11:51 PM   Specimen: BLOOD LEFT HAND  Result Value Ref Range Status   Specimen Description BLOOD LEFT HAND  Final   Special Requests   Final    BOTTLES DRAWN AEROBIC ONLY Blood Culture results may not be optimal due to an inadequate volume of blood received in culture bottles Performed at [facility] [address], [place], [place] [address]    Culture PENDING  Incomplete   Report Status PENDING  Incomplete     Radiological Exams on Admission: DG Chest Portable 1 View  Result Date: 06/20/2022 CLINICAL DATA:  Hypotension EXAM: PORTABLE CHEST 1 VIEW COMPARISON:  01/10/2018 FINDINGS: Stable cardiomediastinal silhouette. Bibasilar atelectasis. No focal pneumonia, pleural effusion, or pneumothorax. No acute osseous abnormality. IMPRESSION: No active disease. Electronically Signed   By: Placido Sou M.D.   On: 06/20/2022 23:44     All images have been reviewed by me personally.  EKG: Independently reviewed.   Assessment/Plan Principal Problem:   Propranolol overdose Active Problems:   Acquired hypothyroidism   Paranoid schizophrenia ([place])   Undifferentiated schizophrenia ([place])   Stimulant use disorder   GAD (generalized anxiety disorder)   Encounter for monitoring Suboxone maintenance therapy   Uncomplicated opioid dependence ([place])   Opioid use disorder, moderate, on maintenance therapy ([facility])   Cigarette nicotine dependence without complication   AKI (acute kidney injury) ([place])   Lactic acid acidosis   Obesity (BMI 30-39.9)     Assessment and Plan:  Assessment Plan  # Propranolol overdose  -Admit to progressive unit -Acetaminophen level less than 10 -Salicylate level less than 7 -UDS negative -Received normal saline bolus 2L at ED -Continue normal saline 125 ml/h -On glucagon infusion - BPs were initially borderline low but normalized after fluids.  Does not require pressors for now   # History of schizophrenia with auditory hallucination  - psych  is consulted  # Anxiety and depression # ADHD - psych is consulted  # Previous polysubstance abuse -UDS negative  # Hypothyroidism - TSH - continue home meds   # Acute kidney injury # Lactic acidosis -Creatinine 1.48 -Lactic acid 2.3--> 1.9 after IV fluids -Renal ultrasound negative -Likely due to dehydration  # Obesity Body mass index is 30.13 kg/m. - weight loss per PCP as outpt             DVT prophylaxis: Lovenox Code Status: Full code Family Communication: none at bedside Consults called: Psych Admission status: obs  Status is: Observation The  patient remains OBS appropriate and will d/c before 2 midnights.   Time Spent: 65 minutes.  >50% of the time was devoted to discussing the patients care, assessment, plan and disposition with other care givers along with counseling the patient about the risks and benefits of treatment.    Charlann Lange MD [facility]  If 7PM-7AM, please contact night-coverage   06/21/2022, 12:03 PM

## 2022-06-21 NOTE — ED Notes (Signed)
The pt is requesting something for anxiety  something strong per the pt

## 2022-06-21 NOTE — ED Notes (Signed)
The pt had been given a liter of lr before I saw the pt  since he came from another  hallway at least 30 minutes before I saw him

## 2022-06-21 NOTE — ED Notes (Signed)
Pt unhappy that he did not get ativan but he took  hydroxine with smart remarks

## 2022-06-21 NOTE — ED Notes (Signed)
Patient reporting acid reflux, requesting medication to help with this. Dr. Nicoletta Dress notified.

## 2022-06-21 NOTE — ED Notes (Signed)
Patient is again asking to have his regular morning medications ordered: Subutex, modafinil, and levothyroxine. Patient also is requesting to speak with the doctor due to medications not yet being ordered. Refusing to take other ordered medications until he can speak with the doctor. Dr. Nicoletta Dress notified

## 2022-06-21 NOTE — BH Assessment (Signed)
Clinician secure chat RN Christine Chrisco and NT Junius Argyle to inquire on completing TTS assessment. Clinician currently waiting for response.  Darra Lis, MSW, LCSW Triage Specialist

## 2022-06-21 NOTE — ED Notes (Signed)
The pt has had 2 liters of fluid the pa does not want to give the 3rd liter

## 2022-06-21 NOTE — ED Notes (Signed)
The pt  requesting to go to the bathroom for a bm  not willing to go unless he can go by himself .  The pt needs to stay on the  monitor  he refuses to go  either on the bedside commode or in the bathroom with someone staying in the br with him

## 2022-06-21 NOTE — ED Notes (Signed)
Pa asking about another bag of fluid  no new order and no order for a lactic acid  I have no explanation  for her orders not reaching my computer when I placed the lactic order myself  a duplicate showed up ?????

## 2022-06-21 NOTE — ED Notes (Signed)
The pt is talking non-stop when asked if he was suicidal he responded with    the voices in my head keep telling me to do things and sometimes you get tired of hearing them

## 2022-06-21 NOTE — ED Notes (Signed)
Lactic acid not collected pt refuse lab RN notified

## 2022-06-21 NOTE — Consult Note (Cosign Needed Addendum)
NAME:  Paul Bray, MRN:  [id number], DOB:  1966-03-30, LOS: 0 ADMISSION DATE:  06/20/2022, CONSULTATION DATE:  06/21/22 REFERRING MD:  Nicoletta Dress - [facility] , CHIEF COMPLAINT:  hypotension    History of Present Illness:  57 yo M PMH Schizophrenia, polysubstance abuse, hypothyroidism, prior cerebral neoplasm s/p gamma knife who presented to ED 2/24 requesting IVF. At home 2/24 his auditory hallucinations were overwhelming and he tool his PRN propranolol to try to help what he felt was high BP and anxiety. Says when he does take propranolol usually takes 2-3 (40-'60mg'$ ) but yesterday took 4 ('80mg'$ ). The hallucinations were ongoing and he presented to the ED (sounds like on foot).  While awaiting IVF he was found to be hypotensive, prompting tx for a possible beta blocker overdose. Admitted to [facility] 2/25 in this setting. Did have slightly elevated LA on presentation which normalized on repeat   2/25 he was hypotensive in the afternoon, SBP 80s PCCM consulted in this setting   Pertinent  Medical History    Significant Hospital Events: Including procedures, antibiotic start and stop dates in addition to other pertinent events   2/24 to ED from home for IVF. Found to be hypotensive, took '80mg'$  propranolol  2/25 was tx for possible beta blocker overdose at advice of poison control w glucagon gtt. Became hypotensive in afternoon so PCCM consulted   Interim History / Subjective:  BP dropped to SBP 80s   He feels better today. Has been up and walking around a fair bit.  Yesterday shares that when he sat up abruptly he felt dizzy but this is no longer happening today  Hallucinations are better too   Looks better than NIBP would suggest   Objective   Blood pressure (!) 74/52, pulse 75, temperature 97.6 F (36.4 C), temperature source Oral, resp. rate 13, height '5\' 10"'$  (1.778 m), weight 95.3 kg, SpO2 96 %.        Intake/Output Summary (Last 24 hours) at 06/21/2022 1521 Last data filed at 06/21/2022 [id number] Gross  per 24 hour  Intake 3000 ml  Output 950 ml  Net 2050 ml   Filed Weights   06/20/22 2123  Weight: 95.3 kg    Examination: General: WDWN middle aged M NAD  HENT: NCAT pink mm Lungs: CTAb Cardiovascular: rrr cap refill brisk  Abdomen: soft ndnt  Extremities: no acute joint deformity Neuro: AAOx4 following commands GU: defer  Resolved Hospital Problem list     Assessment & Plan:   Hypotension, possibly acute on chronic  Adverse effect of beta blocker, improved Chest pain, improved Schizophrenia with auditory hallucinations -took '80mg'$  propranolol. Does not sound like this was related to suicidality. Denies SI.  -looks like there is some variability to his baseline pressures, but I see quite a few SBP low 100s in which case currently we are not that far off from baseline. Clinically he does not seem hypotensive, his has great mentation, LA is normal.  -did c/o new chest pain last night, at that time trops were neg. No known hx cv disease -at this point in clinical course, I would think that propranolol (if cited ingested dose is accurate) should be grossly metabolized by now. His HR would support this as well -LA is normal, no leukocytosis. Has a coude, but urine and clinical picture does not align w septic shock  -does endorse recent incr to haldol Rx, now on '10mg'$   P -I cycled the BP and was SBP 98 which is close to baseline  -  ok for progressive at this time  -will look at other etiologies of hypotension -ECHO, EKG -will check another LA  -add'l bolus IVF , may need further volume  -encourage PO intake  -cont home psych meds -- which notably also may be contributing to BP   Best Practice (right click and "Reselect all SmartList Selections" daily)   Per primary   Labs   CBC: Recent Labs  Lab 06/20/22 2140 06/21/22 1203  WBC 10.3 7.7  HGB 15.6 12.2*  HCT 44.0 35.6*  MCV 90.5 92.7  PLT 325 [id number]    Basic Metabolic Panel: Recent Labs  Lab 06/20/22 2140  06/21/22 1203  NA 136  --   K 4.3  --   CL 99  --   CO2 24  --   GLUCOSE 160*  --   BUN 18  --   CREATININE 1.48* 1.17  CALCIUM 9.3  --    GFR: Estimated Creatinine Clearance: 83.6 mL/min (by C-G formula based on SCr of 1.17 mg/dL). Recent Labs  Lab 06/20/22 2140 06/20/22 2351 06/21/22 0311 06/21/22 1203  WBC 10.3  --   --  7.7  LATICACIDVEN  --  2.3* 0.9  --     Liver Function Tests: Recent Labs  Lab 06/20/22 2140  AST 27  ALT 27  ALKPHOS 74  BILITOT 0.9  PROT 7.0  ALBUMIN 4.4   No results for input(s): "LIPASE", "AMYLASE" in the last 168 hours. No results for input(s): "AMMONIA" in the last 168 hours.  ABG No results found for: "PHART", "PCO2ART", "PO2ART", "HCO3", "TCO2", "ACIDBASEDEF", "O2SAT"   Coagulation Profile: Recent Labs  Lab 06/20/22 2351  INR 1.0    Cardiac Enzymes: No results for input(s): "CKTOTAL", "CKMB", "CKMBINDEX", "TROPONINI" in the last 168 hours.  HbA1C: Hgb A1c MFr Bld  Date/Time Value Ref Range Status  10/03/2021 06:18 AM 5.0 4.8 - 5.6 % Final    Comment:    (NOTE) Pre diabetes:          5.7%-6.4%  Diabetes:              >6.4%  Glycemic control for   <7.0% adults with diabetes     CBG: No results for input(s): "GLUCAP" in the last 168 hours.  Review of Systems:   Review of Systems  Constitutional: Negative.   HENT: Negative.    Eyes: Negative.   Respiratory: Negative.    Cardiovascular:  Positive for chest pain.  Gastrointestinal: Negative.   Genitourinary: Negative.   Musculoskeletal:  Positive for myalgias.  Skin: Negative.   Neurological:  Positive for dizziness.  Endo/Heme/Allergies: Negative.   Psychiatric/Behavioral:  Positive for hallucinations. The patient is nervous/anxious.      Past Medical History:  He,  has a past medical history of ADHD, Anxiety, Brain tumor ([place]), Brain tumor ([place]), Depression, Headache, Hypothyroidism, Soft tissue mass, and Substance abuse ([place]).   Surgical History:    Past Surgical History:  Procedure Laterality Date   colonscopy     gamma knife radiation 2018 for brain tumor'     hematoma removed from arm Left    MASS EXCISION Left 06/30/2019   Procedure: EXCISION OF SOFT TISSUE  MASS LEFT SHOULDER;  Surgeon: Armandina Gemma, MD;  Location: [place];  Service: General;  Laterality: Left;  LMA     Social History:   reports that he has quit smoking. His smoking use included cigarettes. He uses smokeless tobacco. He reports that he does not currently use alcohol.  He reports that he does not currently use drugs after having used the following drugs: Cocaine, IV, Heroin, and MDMA (Ecstacy).   Family History:  His family history is not on file.   Allergies Allergies  Allergen Reactions   Ingrezza [Valbenazine Tosylate] Other (See Comments)    Severe tongue chewing at 80 mg Tolerates lower doses   Naloxone Palpitations    Pt report MAT Rx is Subutex/Buprenorphine only   Amphetamine-Dextroamphetamine Other (See Comments)    unknown     Home Medications  Prior to Admission medications   Medication Sig Start Date End Date Taking? Authorizing Provider  buprenorphine (SUBUTEX) 8 MG SUBL SL tablet Place 8 mg under the tongue 3 (three) times daily. 11/11/21  Yes [provider]  diphenhydrAMINE (BENADRYL) 50 MG tablet Take 2-4 tablets q6h as needed for restlessness related to Haldol Patient taking differently: Take 25 mg by mouth every 8 (eight) hours as needed (For restlessness per patient). 06/11/22  Yes Dara Hoyer, PA-C  doxycycline (VIBRAMYCIN) 50 MG capsule Take 1 capsule (50 mg total) by mouth daily. 12/25/21 06/23/22 Yes Dara Hoyer, PA-C  gabapentin (NEURONTIN) 400 MG capsule Take 1 capsule (400 mg total) by mouth 2 (two) times daily for 180 doses. 06/11/22 09/09/22 Yes Dara Hoyer, PA-C  haloperidol (HALDOL) 10 MG tablet Take 1 tablet (10 mg total) by mouth daily. Patient taking differently: Take 10 mg by mouth  at bedtime. 06/11/22 09/09/22 Yes Dara Hoyer, PA-C  hydrOXYzine (ATARAX) 25 MG tablet Take 25 mg by mouth in the morning and at bedtime.   Yes [provider]  levothyroxine (SYNTHROID) 150 MCG tablet Take 1 tablet (150 mcg total) by mouth daily. 06/11/22 06/11/23 Yes Dara Hoyer, PA-C  modafinil (PROVIGIL) 200 MG tablet Take 600 mg by mouth daily.   Yes [provider]  nicotine (NICODERM CQ - DOSED IN MG/24 HOURS) 21 mg/24hr patch Place 21 mg onto the skin daily.   Yes [provider]  propranolol (INDERAL) 20 MG tablet Take 20 mg by mouth daily as needed (For blood pressure). 11/03/21  Yes [provider]  tamsulosin (FLOMAX) 0.4 MG CAPS capsule Take 1 capsule (0.4 mg total) by mouth at bedtime. 06/17/22 07/17/22 Yes Elgie Congo, MD     Critical care time: n/a    Eliseo Gum MSN, AGACNP-BC [place] for pager  06/21/2022, 3:21 PM

## 2022-06-21 NOTE — Progress Notes (Signed)
Carryover admission to the Day Admitter.  I discussed this case with the EDP, Rebekah Sponseller.  Per these discussions:   This is a 57 year old male with history of schizophrenia with recurrent auditory hallucinations, who is being admitted for propranolol overdose.  The patient conveys that he has been recently hearing voices in his head more frequently, and that yesterday, these voices instructed him to take extra of his home propranolol.  He notes that he took at least 3 propranolol in order to acquiesce with these voices, while noting no independent homicidal or suicidal ideations.  The specific timing of taking the additional propranolol is not entirely clear.  Heart rates have been in the 60s to 70s, while initial blood pressures were hypotensive, systolic values in the [id number] to 80s.  He was simply started on glucagon drip after 3 L of IV fluid, with ensuing improvement in systolic blood pressures now into the low 100s.  EDP discussed case with poison control, who agreed with glucagon drip, and had no additional recommendations other than recommending contingency Levophed if blood pressure decreases on propranolol.  I have placed an order for observation for further evaluation management of the above  I have placed some additional preliminary admit orders via the adult multi-morbid admission order set.     Babs Bertin, DO Hospitalist

## 2022-06-21 NOTE — ED Notes (Signed)
ED TO INPATIENT HANDOFF REPORT  ED Nurse Name and Phone #: Richardson Landry [phone]  S Name/Age/Gender Paul Bray 57 y.o. male Room/Bed: 029C/029C  Code Status   Code Status: Full Code  Home/SNF/Other Home Patient oriented to: self, place, time, and situation Is this baseline? Yes   Triage Complete: Triage complete  Chief Complaint Propranolol overdose [T44.7X1A] Drug overdose [T50.901A]  Triage Note Pt c/o auditory hallucinations with suicidal thoughts without a plan. Pt denies chest pain, shortness of breath, or dizziness.    Allergies Allergies  Allergen Reactions   Ingrezza [Valbenazine Tosylate] Other (See Comments)    Severe tongue chewing at 80 mg Tolerates lower doses   Naloxone Palpitations    Pt report MAT Rx is Subutex/Buprenorphine only   Amphetamine-Dextroamphetamine Other (See Comments)    unknown    Level of Care/Admitting Diagnosis ED Disposition     ED Disposition  Admit   Condition  --   Comment  Hospital Area: [facility] [[id number]]  Level of Care: Telemetry Medical [104]  May admit patient to [facility] or [facility] if equivalent level of care is available:: No  Covid Evaluation: Asymptomatic - no recent exposure (last 10 days) testing not required  Diagnosis: Drug overdose [202575]  Admitting Physician: Nicoletta Dress, [place]  Attending Physician: Nicoletta Dress, NA [id number]  Certification:: I certify this patient will need inpatient services for at least 2 midnights  Estimated Length of Stay: 2          B Medical/Surgery History Past Medical History:  Diagnosis Date   ADHD    Anxiety    Brain tumor ([place])    balance and occ memory issues and headaches   Brain tumor ([place])    sees [facility] q year   Depression    Headache    migraine and tension   Hypothyroidism    Soft tissue mass    left shoulder   Substance abuse ([place])    Past Surgical History:  Procedure Laterality Date   colonscopy     gamma knife radiation 2018 for  brain tumor'     hematoma removed from arm Left    MASS EXCISION Left 06/30/2019   Procedure: EXCISION OF SOFT TISSUE  MASS LEFT SHOULDER;  Surgeon: Armandina Gemma, MD;  Location: [facility];  Service: General;  Laterality: Left;  LMA     A IV Location/Drains/Wounds Patient Lines/Drains/Airways Status     Active Line/Drains/Airways     Name Placement date Placement time Site Days   Peripheral IV 06/20/22 20 G 1" Anterior;Right Forearm 06/20/22  2314  Forearm  1   Urethral Catheter Edd Arbour RN [place] 14 Fr. 06/16/22  1500  [place]  5            Intake/Output Last 24 hours  Intake/Output Summary (Last 24 hours) at 06/21/2022 1704 Last data filed at 06/21/2022 [id number] Gross per 24 hour  Intake 3000 ml  Output 950 ml  Net 2050 ml    Labs/Imaging Results for orders placed or performed during the hospital encounter of 06/20/22 (from the past 48 hour(s))  Comprehensive metabolic panel     Status: Abnormal   Collection Time: 06/20/22  9:40 PM  Result Value Ref Range   Sodium 136 135 - 145 mmol/L   Potassium 4.3 3.5 - 5.1 mmol/L   Chloride 99 98 - 111 mmol/L   CO2 24 22 - 32 mmol/L   Glucose, Bld 160 (H) 70 - 99 mg/dL  Comment: Glucose reference range applies only to samples taken after fasting for at least 8 hours.   BUN 18 6 - 20 mg/dL   Creatinine, Ser 1.48 (H) 0.61 - 1.24 mg/dL   Calcium 9.3 8.9 - 10.3 mg/dL   Total Protein 7.0 6.5 - 8.1 g/dL   Albumin 4.4 3.5 - 5.0 g/dL   AST 27 15 - 41 U/L   ALT 27 0 - 44 U/L   Alkaline Phosphatase 74 38 - 126 U/L   Total Bilirubin 0.9 0.3 - 1.2 mg/dL   GFR, Estimated 56 (L) >60 mL/min    Comment: (NOTE) Calculated using the CKD-EPI Creatinine Equation (2021)    Anion gap 13 5 - 15    Comment: Performed at [facility] [address], [place], [place] [address]  Ethanol     Status: None   Collection Time: 06/20/22  9:40 PM  Result Value Ref Range   Alcohol, Ethyl (B) <10 <10 mg/dL    Comment: (NOTE) Lowest  detectable limit for serum alcohol is 10 mg/dL.  For medical purposes only. Performed at [facility], [place] [address] [place], [place] [id number]   Salicylate level     Status: Abnormal   Collection Time: 06/20/22  9:40 PM  Result Value Ref Range   Salicylate Lvl [id number] (L) 7.0 - 30.0 mg/dL    Comment: Performed at [facility] [address] [place], [place] [address]  Acetaminophen level     Status: Abnormal   Collection Time: 06/20/22  9:40 PM  Result Value Ref Range   Acetaminophen (Tylenol), Serum <10 (L) 10 - 30 ug/mL    Comment: (NOTE) Therapeutic concentrations vary significantly. A range of 10-30 ug/mL  may be an effective concentration for many patients. However, some  are best treated at concentrations outside of this range. Acetaminophen concentrations >150 ug/mL at 4 hours after ingestion  and >50 ug/mL at 12 hours after ingestion are often associated with  toxic reactions.  Performed at [facility], [place] [address] [place], [place] [address]   cbc     Status: None   Collection Time: 06/20/22  9:40 PM  Result Value Ref Range   WBC 10.3 4.0 - 10.5 K/uL   RBC 4.86 4.22 - 5.81 MIL/uL   Hemoglobin 15.6 13.0 - 17.0 g/dL   HCT 44.0 39.0 - 52.0 %   MCV 90.5 80.0 - 100.0 fL   MCH 32.1 26.0 - 34.0 pg   MCHC 35.5 30.0 - 36.0 g/dL   RDW 13.3 11.5 - 15.5 %   Platelets 325 150 - 400 K/uL   nRBC 0.0 0.0 - 0.2 %    Comment: Performed at [facility], [place] [address], [place], [place] [address]  Lactic acid, plasma     Status: Abnormal   Collection Time: 06/20/22 11:51 PM  Result Value Ref Range   Lactic Acid, Venous 2.3 (HH) 0.5 - 1.9 mmol/L    Comment: CRITICAL RESULT CALLED TO, READ BACK BY AND VERIFIED WITH C.CHRISCO,RN. [id number] 06/21/22. LPAIT Performed at [facility], [place] [address], [place], [place] [address]   Protime-INR     Status: None   Collection Time: 06/20/22 11:51 PM  Result Value Ref Range   Prothrombin Time 13.1 11.4 - 15.2  seconds   INR 1.0 0.8 - 1.2    Comment: (NOTE) INR goal varies based on device and disease states. Performed at [facility], [address], [place], [place] [address]   APTT     Status: Abnormal   Collection Time: 06/20/22 11:51 PM  Result Value Ref Range   aPTT 22 (L) 24 - 36 seconds    Comment: Performed at [facility] [address] [place], [place] [address]  Blood Culture (routine single)     Status: None (Preliminary result)   Collection Time: 06/20/22 11:51 PM   Specimen: BLOOD LEFT HAND  Result Value Ref Range   Specimen Description BLOOD LEFT HAND    Special Requests      BOTTLES DRAWN AEROBIC ONLY Blood Culture results may not be optimal due to an inadequate volume of blood received in culture bottles Performed at [facility] [address] [place], [place] [address]    Culture PENDING    Report Status PENDING   Troponin I (High Sensitivity)     Status: None   Collection Time: 06/20/22 11:51 PM  Result Value Ref Range   Troponin I (High Sensitivity) 4 <18 ng/L    Comment: (NOTE) Elevated high sensitivity troponin I (hsTnI) values and significant  changes across serial measurements may suggest ACS but many other  chronic and acute conditions are known to elevate hsTnI results.  Refer to the "Links" section for chest pain algorithms and additional  guidance. Performed at [facility], [place] [address], [place], [place] [address]   Urinalysis, w/ Reflex to Culture (Infection Suspected) -Urine, Clean Catch     Status: Abnormal   Collection Time: 06/21/22  1:28 AM  Result Value Ref Range   Specimen Source URINE, CLEAN CATCH    Color, Urine YELLOW YELLOW   APPearance HAZY (A) CLEAR   Specific Gravity, Urine 1.009 1.005 - 1.030   pH 6.0 5.0 - 8.0   Glucose, UA NEGATIVE NEGATIVE mg/dL   Hgb urine dipstick MODERATE (A) NEGATIVE   Bilirubin Urine NEGATIVE NEGATIVE   Ketones, ur NEGATIVE NEGATIVE mg/dL   Protein, ur 30 (A) NEGATIVE mg/dL    Nitrite NEGATIVE NEGATIVE   Leukocytes,Ua MODERATE (A) NEGATIVE   RBC / HPF 6-10 0 - 5 RBC/hpf   WBC, UA 6-10 0 - 5 WBC/hpf    Comment:        Reflex urine culture not performed if WBC <=10, OR if Squamous epithelial cells >5. If Squamous epithelial cells >5 suggest recollection.    Bacteria, UA FEW (A) NONE SEEN   Squamous Epithelial / HPF 0-5 0 - 5 /HPF   Mucus PRESENT    Hyaline Casts, UA PRESENT     Comment: Performed at [facility], [place] [address], [place], [place] [address]  Lactic acid, plasma     Status: None   Collection Time: 06/21/22  3:11 AM  Result Value Ref Range   Lactic Acid, Venous 0.9 0.5 - 1.9 mmol/L    Comment: Performed at [facility] [address], [place], [place] [address]  Troponin I (High Sensitivity)     Status: None   Collection Time: 06/21/22  3:11 AM  Result Value Ref Range   Troponin I (High Sensitivity) 3 <18 ng/L    Comment: (NOTE) Elevated high sensitivity troponin I (hsTnI) values and significant  changes across serial measurements may suggest ACS but many other  chronic and acute conditions are known to elevate hsTnI results.  Refer to the "Links" section for chest pain algorithms and additional  guidance. Performed at [facility], [place]  [address] [place], [place] [address]   CBC     Status: Abnormal   Collection Time: 06/21/22 12:03 PM  Result Value Ref Range   WBC 7.7 4.0 - 10.5 K/uL   RBC 3.84 (L) 4.22 - 5.81 MIL/uL   Hemoglobin 12.2 (L) 13.0 - 17.0 g/dL   HCT 35.6 (L) 39.0 - 52.0 %   MCV 92.7 80.0 - 100.0 fL   MCH 31.8 26.0 - 34.0 pg   MCHC 34.3 30.0 - 36.0 g/dL   RDW 13.7 11.5 - 15.5 %   Platelets 207 150 - 400 K/uL   nRBC 0.0 0.0 - 0.2 %    Comment: Performed at [facility], [place] [address], [place], [place] [address]  Creatinine, serum     Status: None   Collection Time: 06/21/22 12:03 PM  Result Value Ref Range   Creatinine, Ser 1.17 0.61 - 1.24 mg/dL   GFR, Estimated >60 >60 mL/min     Comment: (NOTE) Calculated using the CKD-EPI Creatinine Equation (2021) Performed at [facility] [address] [place], [place] [address]    DG Chest Portable 1 View  Result Date: 06/20/2022 CLINICAL DATA:  Hypotension EXAM: PORTABLE CHEST 1 VIEW COMPARISON:  01/10/2018 FINDINGS: Stable cardiomediastinal silhouette. Bibasilar atelectasis. No focal pneumonia, pleural effusion, or pneumothorax. No acute osseous abnormality. IMPRESSION: No active disease. Electronically Signed   By: Placido Sou M.D.   On: 06/20/2022 23:44    Pending Labs Unresulted Labs (From admission, onward)     Start     Ordered   06/28/22 0500  Creatinine, serum  (enoxaparin (LOVENOX)    CrCl >/= 30 ml/min)  Weekly,   R     Comments: while on enoxaparin therapy    06/21/22 1203   06/22/22 0500  TSH  Tomorrow morning,   R        06/21/22 1156   06/22/22 0500  CBC  Tomorrow morning,   R        06/21/22 1156   06/22/22 [id number]  Basic metabolic panel  Tomorrow morning,   R        06/21/22 1156   06/21/22 1429  Lactic acid, plasma  STAT Now then every 3 hours,   R (with STAT occurrences)      06/21/22 1428   06/21/22 1428  Comprehensive metabolic panel  ONCE - URGENT,   URGENT        06/21/22 1428   06/21/22 0309  Lactic acid, plasma  Now then every 2 hours,   R (with STAT occurrences)      06/21/22 0308   06/21/22 0309  Lactic acid, plasma  Now then every 2 hours,   R (with STAT occurrences)      06/21/22 0309   06/20/22 2130  Rapid urine drug screen (hospital performed)  Once,   STAT        06/20/22 2129            Vitals/Pain Today's Vitals   06/21/22 1600 06/21/22 1630 06/21/22 1645 06/21/22 1647  BP: 98/67 102/78 (!) 98/57   Pulse: 64 67 68   Resp: '12 15 13   '$ Temp:    98.1 F (36.7 C)  TempSrc:      SpO2: 96% 98% 96%   Weight:      Height:      PainSc:        Isolation Precautions No active isolations  Medications Medications  glucagon (human recombinant) (GLUCAGEN) 5 mg in  dextrose 5 % 50 mL (0.1 mg/mL) infusion (1 mg/hr Intravenous New Bag/Given 06/21/22 1641)  acetaminophen (TYLENOL) tablet 650 mg (has no administration in time range)    Or  acetaminophen (TYLENOL) suppository 650 mg (has no administration in time range)  0.9 %  sodium chloride infusion ( Intravenous New Bag/Given 06/21/22 1644)  pantoprazole (PROTONIX) EC tablet 40 mg (40 mg Oral Given 06/21/22 1300)  haloperidol (HALDOL) tablet 10 mg (has no administration in time range)  hydrOXYzine (ATARAX) tablet 25 mg (has no administration in time range)  nicotine (NICODERM CQ - dosed in mg/24 hours) patch 21 mg (21 mg Transdermal Patch Applied 06/21/22 1301)  levothyroxine (SYNTHROID) tablet 150 mcg (150 mcg Oral Given 06/21/22 1301)  tamsulosin (FLOMAX) capsule 0.4 mg (has no administration in time range)  gabapentin (NEURONTIN) capsule 400 mg (400 mg Oral Given 06/21/22 1301)  diphenhydrAMINE (BENADRYL) capsule 25 mg (has no administration in time range)  enoxaparin (LOVENOX) injection 40 mg (40 mg Subcutaneous Given 06/21/22 1301)  promethazine (PHENERGAN) tablet 12.5 mg (has no administration in time range)  buprenorphine (SUBUTEX) sublingual tablet 8 mg (8 mg Sublingual Not Given 06/21/22 1656)  sodium chloride 0.9 % bolus 1,000 mL (1,000 mLs Intravenous Not Given 06/21/22 1353)  modafinil (PROVIGIL) tablet 200 mg (has no administration in time range)  sodium chloride 0.9 % bolus 1,000 mL (0 mLs Intravenous Stopped 06/21/22 1528)  sodium chloride 0.9 % bolus 1,000 mL (0 mLs Intravenous Stopped 06/21/22 0157)  glucagon (human recombinant) (GLUCAGEN) injection 3 mg (3 mg Intravenous Given 06/21/22 0436)  ondansetron (ZOFRAN) injection 4 mg (4 mg Intravenous Given 06/21/22 0413)  hydrOXYzine (ATARAX) tablet 25 mg (25 mg Oral Given 06/21/22 0546)  alum & mag hydroxide-simeth (MAALOX/MYLANTA) 200-200-20 MG/5ML suspension 30 mL (30 mLs Oral Given 06/21/22 1300)    And  lidocaine (XYLOCAINE) 2 % viscous mouth  solution 15 mL (15 mLs Oral Given 06/21/22 1300)  calcium gluconate 1 g/ 50 mL sodium chloride IVPB (0 mg Intravenous Stopped 06/21/22 1501)  sodium chloride 0.9 % bolus 1,000 mL (0 mLs Intravenous Stopped 06/21/22 1700)    Mobility walks     Focused Assessments Cardiac Assessment Handoff:    Lab Results  Component Value Date   CKTOTAL 113 01/09/2018   TROPONINI <0.03 01/10/2018   No results found for: "DDIMER" Does the Patient currently have chest pain? No    R Recommendations: See Admitting Provider Note  Report given to:   Additional Notes:

## 2022-06-21 NOTE — ED Notes (Signed)
Patient requesting to have his subutex, modafinil, and levothyroxine ordered. Dr. Nicoletta Dress notified

## 2022-06-21 NOTE — BH Assessment (Addendum)
Clinician followed-up in reference to completing TTS assessment. RN Altha Harm Chrisco reports patient just requested anxiety medication and he is trying to sleep. RN reports Pt not medically cleared and may have to be admitted.  Darra Lis, MSW, LCSW  Triage Specialist

## 2022-06-22 ENCOUNTER — Inpatient Hospital Stay (HOSPITAL_COMMUNITY): Payer: Medicare Other

## 2022-06-22 DIAGNOSIS — F1590 Other stimulant use, unspecified, uncomplicated: Secondary | ICD-10-CM

## 2022-06-22 DIAGNOSIS — T447X2A Poisoning by beta-adrenoreceptor antagonists, intentional self-harm, initial encounter: Secondary | ICD-10-CM

## 2022-06-22 DIAGNOSIS — F203 Undifferentiated schizophrenia: Secondary | ICD-10-CM

## 2022-06-22 DIAGNOSIS — I5021 Acute systolic (congestive) heart failure: Secondary | ICD-10-CM | POA: Diagnosis not present

## 2022-06-22 LAB — CBC
HCT: 31.3 % — ABNORMAL LOW (ref 39.0–52.0)
Hemoglobin: 11 g/dL — ABNORMAL LOW (ref 13.0–17.0)
MCH: 32.4 pg (ref 26.0–34.0)
MCHC: 35.1 g/dL (ref 30.0–36.0)
MCV: 92.1 fL (ref 80.0–100.0)
Platelets: 161 10*3/uL (ref 150–400)
RBC: 3.4 MIL/uL — ABNORMAL LOW (ref 4.22–5.81)
RDW: 14 % (ref 11.5–15.5)
WBC: 4.6 10*3/uL (ref 4.0–10.5)
nRBC: 0 % (ref 0.0–0.2)

## 2022-06-22 LAB — ECHOCARDIOGRAM LIMITED
Area-P 1/2: 4.06 cm2
Height: 70 in
S' Lateral: 2.6 cm
Weight: 3396.85 oz

## 2022-06-22 LAB — BASIC METABOLIC PANEL
Anion gap: 6 (ref 5–15)
BUN: 18 mg/dL (ref 6–20)
CO2: 22 mmol/L (ref 22–32)
Calcium: 7.6 mg/dL — ABNORMAL LOW (ref 8.9–10.3)
Chloride: 111 mmol/L (ref 98–111)
Creatinine, Ser: 1.03 mg/dL (ref 0.61–1.24)
GFR, Estimated: >60 mL/min (ref >60)
Glucose, Bld: 113 mg/dL — ABNORMAL HIGH (ref 70–99)
Potassium: 3.7 mmol/L (ref 3.5–5.1)
Sodium: 139 mmol/L (ref 135–145)

## 2022-06-22 LAB — TSH: TSH: 3.257 u[IU]/mL (ref 0.350–4.500)

## 2022-06-22 MED ORDER — HALOPERIDOL 5 MG PO TABS
15.0000 mg | ORAL_TABLET | Freq: Every day | ORAL | Status: DC
  Administered 2022-06-22 – 2022-06-23 (×2): 15 mg via ORAL
  Filled 2022-06-22 (×2): qty 3

## 2022-06-22 NOTE — Consult Note (Addendum)
Addendum: dc modafanil d/t recent hx abuse and hallucinations, spoke with outpt provider who does not plan to restart at [facility] Psychiatry New Face-to-Face Psychiatric Evaluation   Service Date: June 22, 2022 LOS:  LOS: 1 day    Assessment  Paul Bray is a 57 y.o. male admitted medically for 06/20/2022  9:20 PM for an intentional beta-blocker overdose (unclear if suicidal intent - reportedly took for high BP and anxiety). 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 Prior hx polysubstance abuse (cocaine, heroin, MDMA).  Psychiatry was consulted for "Patient with history of schizophrenia and auditory hallucination presented with propranolol overdose.  He is hostile during exam" by Dr. Oleta Bray.    His current presentation of ongoing hallucinations is most consistent with decompensated psychosis - unclear if true schizoprhenia or psychosis 2/2 medical condition; he has improved in response to antipsychotics in the past and enjoyed a long period of stability on relatively low-dose haloperidol until a period of noncompliance this January. He is a danger to himself and meets inpatient criteria and [place] IVC criteria due to command auditory hallucinations; although it is unclear whether or not this overdose represents a true suicide attempt he is responding to command auditory hallucinations which tell him to harm himself. My best estimate, based on prolonged vital sign abnormality, is that he took the extra propranalol his mother gave him (below) as well as some he had (as he endorsed to staff prior to my exam) as 2-3 pills would not be enough to lead to sustained hypotension and bradycardia. Notably his story has shifted several times since admission, which in and of itself makes it difficult to do safety planning as pt does not appear to be forthcoming with  necessary information.  Current outpatient psychotropic medications include haloperidol 10 mg and historically he has had a fair response to these medications - am deferring medication changes until I can connect with his outpatient provider and echo is resulted. He was semi compliant with medications prior to admission as evidenced by mother's collateral. On initial examination, patient was irritable, demanding, paranoid, and had poor insight into current situation. Please see plan below for detailed recommendations.   Diagnoses:  Active Hospital problems: Principal Problem:   Propranolol overdose Active Problems:   Acquired hypothyroidism   Paranoid schizophrenia ([place])   Undifferentiated schizophrenia ([place])   Stimulant use disorder   GAD (generalized anxiety disorder)   Encounter for monitoring Suboxone maintenance therapy   Uncomplicated opioid dependence ([place])   Opioid use disorder, moderate, on maintenance therapy (HCC)   Cigarette nicotine dependence without complication   AKI (acute kidney injury) ([place])   Lactic acidemia   Obesity (BMI 30-39.9)   Auditory hallucination   Anxiety   Attention deficit hyperactivity disorder (ADHD)   Drug overdose   Hypotension   Adverse reaction to beta-blocker     Plan  ## Safety and Observation Level:  - Based on my clinical evaluation, I estimate the patient to be at high risk of self harm in the current setting - At this time, we recommend a 1:1 level of observation. This decision is based on my review of the chart including patient's history and current presentation, interview of the patient, mental status examination, and consideration of suicide risk including evaluating suicidal ideation, plan, intent, suicidal or self-harm behaviors, risk factors, and protective factors. This judgment is based  on our ability to directly address suicide risk, implement suicide prevention strategies and develop a safety plan while the patient is in the  clinical setting. Please contact our team if there is a concern that risk level has changed.   ## Medications:  -- INCREASE haloperidol to 10 mg  ## Medical Decision Making Capacity:  - not formally assessed  ## Further Work-up:  -- none currently    -- most recent EKG on 2/24 had QtC of 430 -- Pertinent labwork reviewed earlier this admission includes: TSH WNL, UDS not resulted,  - No head imaging this admission, but recent CT on 05/16/2022 with no interval change.   ## Disposition:  -- inpt psychiatry when medically clear (catheter largest barrier, likely being removed)  -- have not initiated referral process (needs to pass voiding trial)  ##Legal Status -- filling out IVC 2/26; expires 3/4   Thank you for this consult request. Recommendations have been communicated to the primary team.  We will continue to follow at this time.   [place] A Paul Bray   New history   Relevant Aspects of Hospital Course:  Admitted on 06/20/2022 for a beta-blocker overdose.  Patient Report:  Pt seen in AM. Initially declined interview because waiting on cup of coffee but ultimately assented to continue.   States he is here because he is "hearing voices, elaborate, many, multiple personality, I don't know how they are coming from me" (this is close to a direct transcription). Fairly poor historian. This has been going on for a long time, but went into a tailspin in [place] the increase of haldol helped some. He denied to me taking extra propranalol "that's a stupid theory, I never said I took extra". His main complaints are hearing voices and feeling tired all the time. He wishes medical providers would focus more on his fatigue. Doesn't consent to a couple of suggested medication changes, did seem open to changes if PA Paul Bray approved (outpt provider, good rapport).   States HH was a "joke" and declined inpt hospitalization when discussed.   Gives permission to call mom.   Also stopped  by around 12 to notify pt of IVC. Pt indicated he would consent to whatever plan Mr. Paul Bray recommended but would not consent to med changes without looping him in. Informed him I wouldn't be able to come by and talk about changes to tomorrow which he acknowledged.    ROS:  + AH  Collateral information:  Called mother with pt's permission Mother states he was at [employer] and "farmed" out to [facility]; was discharged while hearing voices. They went back to [facility] a week later (this would be the stay from 2/17 to 2/20) and he couldn't pee so they tried to cath him and he was sent up to urology; he got a catheter and was discharged with the catheter in, has been covered at urine for last few days. Was still hearing voices when discharged, also roaming property which usually means he is having visual hallucinations (although didn't endorse this). The voices had been telling him they were coming to the house to kill him and his mother. Mom gave him propranalol to help deal with this, she has everything else she knows of locked up but he does stash pills so can't be sure. He texted her and told her he called for an ambulance - pt had decided to walk to the hospital. She isn't sure how much of this is related to brain tumor  and how much is intrinsic. It sounds like there was a long period of noncompliance in late Jan after mom had a foot surgery which does explain his recent decompensation. She plans to make him take pills in front of her from here on out.   Has some sort of urology followup on 3/1 but mom isn't sure what it's about.   Spoke to outpt provider: Pt has history of non-response to multiple atypical antipsychoitcs. Initially responded to 20 mg of haldol, then tapered to 1 mg over a long period of time d/t sensitivity to neuroleptics without resumption of psyhcosis until ~jan. Would probably either increase haldol or start another typical such as trilafon. Notes he has a mild tremor @  baseline.    Psychiatric History:  Information collected from pt, medical record Many lifetime hospitalizations, most recently in [facility] in early Feb 2023; was seen in our ED on 06/16/2022 with chronic auditory hallucinations and discharged at that time Has v frequent followup with [name], PA-C  Current oupt meds include: Buprenorphine 8 TID Buproprion 100 BID Divalproex 250 qD (unclear indication - not prescribed by primary psych provider) Gabapentin 400 BID Haldol 10 qD - increased from 1 mg on 2/15 Vistaril 25 TID ?PRN Levothyroxine 150 qD  Modafanil 200 qD (overdosed on this recently)   Family psych history: unknown   Social History:   Previously worked as a Psychologist, 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  Medical History: Past Medical History:  Diagnosis Date   ADHD    Anxiety    Brain tumor ([place])    balance and occ memory issues and headaches   Brain tumor ([place])    sees [facility] q year   Depression    Headache    migraine and tension   Hypothyroidism    Soft tissue mass    left shoulder   Substance abuse ([place])     Surgical History: Past Surgical History:  Procedure Laterality Date   colonscopy     gamma knife radiation 2018 for brain tumor'     hematoma removed from arm Left    MASS EXCISION Left 06/30/2019   Procedure: EXCISION OF SOFT TISSUE  MASS LEFT SHOULDER;  Surgeon: Armandina Gemma, MD;  Location: [facility];  Service: General;  Laterality: Left;  LMA    Medications:   Current Facility-Administered Medications:    acetaminophen (TYLENOL) tablet 650 mg, 650 mg, Oral, Q6H PRN, 650 mg at 06/21/22 2157 **OR** acetaminophen (TYLENOL) suppository 650 mg, 650 mg, Rectal, Q6H PRN, Nicoletta Dress, Na, MD   buprenorphine (SUBUTEX) SL tablet 8 mg, 8 mg, Sublingual, TID, Nicoletta Dress, Na, MD, 8 mg at 06/22/22 1049    Chlorhexidine Gluconate Cloth 2 % PADS 6 each, 6 each, Topical, Daily, Li, Na, MD, 6 each at 06/22/22 [id number]   diphenhydrAMINE (BENADRYL) capsule 25 mg, 25 mg, Oral, Q8H PRN, Nicoletta Dress, Na, MD   doxycycline (VIBRAMYCIN) 50 MG capsule 50 mg, 50 mg, Oral, Daily, Hall, Carole N, DO, 50 mg at 06/22/22 0818   enoxaparin (LOVENOX) injection 40 mg, 40 mg, Subcutaneous, Q24H, Li, Na, MD, 40 mg at 06/22/22 1348   gabapentin (NEURONTIN) capsule 400 mg, 400 mg, Oral, BID, Li, Na, MD, 400 mg at 06/22/22 0818   haloperidol (HALDOL) tablet 10 mg, 10 mg, Oral, QHS, Li, Na, MD, 10 mg at  06/21/22 2143   hydrOXYzine (ATARAX) tablet 25 mg, 25 mg, Oral, BID AC & HS, Li, Na, MD, 25 mg at 06/22/22 0818   levothyroxine (SYNTHROID) tablet 150 mcg, 150 mcg, Oral, Daily, Nicoletta Dress, Na, MD, 150 mcg at 06/22/22 0616   modafinil (PROVIGIL) tablet 600 mg, 600 mg, Oral, Q0600, Nicoletta Dress, Na, MD, 600 mg at 06/22/22 0818   nicotine (NICODERM CQ - dosed in mg/24 hours) patch 21 mg, 21 mg, Transdermal, Daily, Nicoletta Dress, Na, MD, 21 mg at 06/22/22 0821   pantoprazole (PROTONIX) EC tablet 40 mg, 40 mg, Oral, Daily, Nicoletta Dress, Na, MD, 40 mg at 06/22/22 0817   promethazine (PHENERGAN) tablet 12.5 mg, 12.5 mg, Oral, Q6H PRN, Nicoletta Dress, Na, MD   sodium chloride 0.9 % bolus 1,000 mL, 1,000 mL, Intravenous, Once, Li, Na, MD   tamsulosin (FLOMAX) capsule 0.4 mg, 0.4 mg, Oral, QHS, Li, Na, MD, 0.4 mg at 06/21/22 2143  Allergies: Allergies  Allergen Reactions   Ingrezza [Valbenazine Tosylate] Other (See Comments)    Severe tongue chewing at 80 mg Tolerates lower doses   Naloxone Palpitations    Pt report MAT Rx is Subutex/Buprenorphine only   Amphetamine-Dextroamphetamine Other (See Comments)    unknown       Objective  Vital signs:  Temp:  [97.6 F (36.4 C)-98.2 F (36.8 C)] 97.7 F (36.5 C) (02/26 1100) Pulse Rate:  [59-83] 69 (02/26 1100) Resp:  [8-21] 12 (02/26 1100) BP: (81-112)/(51-78) 102/66 (02/26 1100) SpO2:  [92 %-98 %] 94 % (02/26 1100) Weight:  [96.3 kg]  96.3 kg (02/26 [id number])  Psychiatric Specialty Exam:  Presentation  General Appearance:  Appropriate for Environment  Eye Contact: Good  Speech: Clear and Coherent  Speech Volume: Normal  Handedness: Right   Mood and Affect  Mood: -- Engineer, materials)  Affect: -- (Irritable relatively fixed)   Thought Process  Thought Processes: Coherent; Linear  Descriptions of Associations:Intact  Orientation:Full (Time, Place and Person)  Thought Content:Logical  History of Schizophrenia/Schizoaffective disorder:Yes  Duration of Psychotic Symptoms:Greater than six months  Hallucinations:Hallucinations: Auditory  Ideas of Reference:None  Suicidal Thoughts:Suicidal Thoughts: -- (Pt cagey, states + SI but "not often".) SI Passive Intent and/or Plan: Without Intent; Without Plan  Homicidal Thoughts:Homicidal Thoughts: No   Sensorium  Memory: Immediate Fair; Recent Fair  Judgment: Fair  Insight: Fair   Community education officer  Concentration: Fair  Attention Span: Fair  Recall: Good  Fund of Knowledge: Good  Language: Good   Psychomotor Activity  Psychomotor Activity:Psychomotor Activity: -- (mild tremor)   Assets  Assets: Leisure Time; Physical Health; Resilience; Social Support   Sleep  Sleep:Sleep: Fair   Physical Exam: Physical Exam Constitutional:      Appearance: He is obese.  HENT:     Head: Normocephalic.  Eyes:     Conjunctiva/sclera: Conjunctivae normal.  Neurological:     Mental Status: He is oriented to person, place, and time.     Blood pressure 102/66, pulse 69, temperature 97.7 F (36.5 C), temperature source Oral, resp. rate 12, height '5\' 10"'$  (1.778 m), weight 96.3 kg, SpO2 94 %. Body mass index is 30.46 kg/m.

## 2022-06-22 NOTE — Progress Notes (Signed)
Patient IV infiltrated, MD notified and said it was not needed at this time.

## 2022-06-22 NOTE — Progress Notes (Signed)
Patient refusing to have foley removed at this time. Stated he would like for urology to see him regarding this since he has an appointment next week with them. Dr. Wyline Copas made aware.

## 2022-06-22 NOTE — TOC Progression Note (Addendum)
Transition of Care [facility]) - Progression Note    Patient Details  Name: CANDELARIO BELONE MRN: [id number] Date of Birth: 05/09/1965  Transition of Care [facility]) CM/SW [place], LCSW Phone Number: 06/22/2022, 1:33 PM  Clinical Narrative:    CSW completed IVC forms and had MD sign documents. CSW signed as a witness. Documents have been notarized, faxed to magistrate and [employer] has been called to serve paperwork to pt.  IVC ends 7 days from today. MD stated pt can be referral to inpatient psych once medically stable.         Expected Discharge Plan and Services                                               Social Determinants of Health (SDOH) Interventions SDOH Screenings   Alcohol Screen: Low Risk  (10/02/2021)  Tobacco Use: High Risk (06/20/2022)    Readmission Risk Interventions     No data to display         Beckey Rutter, MSW, LCSWA, LCASA Transitions of Care  Clinical Social Worker I

## 2022-06-22 NOTE — Hospital Course (Signed)
57 y.o. male with medical history significant of schizophrenia with recurrent auditory hallucinations, hypothyroidism, anxiety, depression, ADHD, previous polysubstance abuse (IV heroin, cocaine, MDMA), primary malignant neoplasm of the cerebral ventricle, status post gamma knife radiation in 2018, who presented with propranolol overdose. Pt reportedly took 3 propranolol tabs PTA, required IVF bolus and glucagon infusion per recs of poison control

## 2022-06-22 NOTE — Progress Notes (Signed)
  Echocardiogram 2D Echocardiogram has been performed.  Paul Bray 06/22/2022, 11:20 AM

## 2022-06-22 NOTE — Progress Notes (Signed)
  Progress Note   Patient: Paul Bray [id number] DOB: 09-May-1965 DOA: 06/20/2022     1 DOS: the patient was seen and examined on 06/22/2022   Brief hospital course: 57 y.o. male with medical history significant of schizophrenia with recurrent auditory hallucinations, hypothyroidism, anxiety, depression, ADHD, previous polysubstance abuse (IV heroin, cocaine, MDMA), primary malignant neoplasm of the cerebral ventricle, status post gamma knife radiation in 2018, who presented with propranolol overdose. Pt reportedly took 3 propranolol tabs PTA, required IVF bolus and glucagon infusion per recs of poison control  Assessment and Plan: Propranolol OD -BP and HR now stable -Had been on glucagon gtt, now stopped 2/26 as per recommendations of poison control -Stable this AM  Schizophrenia with auditory hallucinations -Psychiatry following -IVC per Psych -At this time, patient is medically stable for inpatient psychiatry if warranted  Anxiety and depression -Seems stable at present  Hx polysubstance abuse -Presenting UDS is negative   Hypothyroid -Presenting TSH normal at 3.257 -continue levothyroxine 192mg   ARF -Cr improved after IVF -renal function normalized -Will hold further IVF  Obesity -recommend diet/lifestyle modification  Indwelling cath -Chart reviewed. Was placed in ED on 2/21 because of "retention" -On flomax -Renal UKoreaon 2/21, reviewed neg for hydronephrosis -Recommend voiding trial  Rosacea -Had been on metrogel topical -Chart reviewed, pt was recommended to stop doxycycline 07/07/21 to avoid photosensitivity on arms. Currently still on doxycycline and pt seems to have been on it chronically since at least 2021.      Subjective: Feeling better  Physical Exam: Vitals:   06/22/22 0737 06/22/22 1000 06/22/22 1005 06/22/22 1100  BP: 101/68 99/74  102/66  Pulse: 71 83 74 69  Resp: 18 (!) 21 16 12  $ Temp: 97.7 F (36.5 C) 97.6 F (36.4 C)  97.7 F  (36.5 C)  TempSrc: Oral Axillary  Oral  SpO2: 98% 96% 96% 94%  Weight:      Height:       General exam: Awake, laying in bed, in nad Respiratory system: Normal respiratory effort, no wheezing Cardiovascular system: regular rate, s1, s2 Gastrointestinal system: Soft, nondistended, positive BS Central nervous system: CN2-12 grossly intact, strength intact Extremities: Perfused, no clubbing Skin: Normal skin turgor, no notable skin lesions seen Psychiatry: Mood normal // affect seems normal  Data Reviewed:  Labs reviewed: Na 139, K 3.7, Cr 1.03, WBC 4.6, Hgb 11.0  Family Communication: Pt in room, family not at bedside  Disposition: Status is: Inpatient Remains inpatient appropriate because: Severity of illness  Planned Discharge Destination:  Unclear at this time     Author: SMarylu Lund MD 06/22/2022 2:38 PM  For on call review [email or web address]

## 2022-06-23 DIAGNOSIS — T447X2A Poisoning by beta-adrenoreceptor antagonists, intentional self-harm, initial encounter: Secondary | ICD-10-CM | POA: Diagnosis not present

## 2022-06-23 MED ORDER — TAMSULOSIN HCL 0.4 MG PO CAPS
0.8000 mg | ORAL_CAPSULE | Freq: Every day | ORAL | Status: DC
  Administered 2022-06-23 – 2022-06-28 (×6): 0.8 mg via ORAL
  Filled 2022-06-23 (×6): qty 2

## 2022-06-23 MED ORDER — MODAFINIL 100 MG PO TABS
600.0000 mg | ORAL_TABLET | Freq: Every day | ORAL | Status: DC
  Administered 2022-06-23 – 2022-06-24 (×2): 600 mg via ORAL
  Filled 2022-06-23 (×2): qty 6

## 2022-06-23 NOTE — Consult Note (Signed)
[facility] Consult   Reason for Consult:  Patient with history of schizophrenia and auditory hallucination presented with propranolol overdosed. He ishostile during exam  Referring Physician:  Charlann Lange Patient Identification: Paul Bray MRN:  [id number] Principal Diagnosis: Propranolol overdose Diagnosis:  Principal Problem:   Propranolol overdose Active Problems:   Acquired hypothyroidism   Paranoid schizophrenia ([place])   Undifferentiated schizophrenia ([place])   Stimulant use disorder   GAD (generalized anxiety disorder)   Encounter for monitoring Suboxone maintenance therapy   Uncomplicated opioid dependence ([place])   Opioid use disorder, moderate, on maintenance therapy (HCC)   Cigarette nicotine dependence without complication   AKI (acute kidney injury) ([place])   Lactic acidemia   Obesity (BMI 30-39.9)   Auditory hallucination   Anxiety   Attention deficit hyperactivity disorder (ADHD)   Drug overdose   Hypotension   Adverse reaction to beta-blocker   Total Time spent with patient: 15 minutes  Subjective:   Paul Bray is a 57 y.o. male seen and evaluated face-to-face by this provider.  Patient presents flat, guarded and slightly irritable during this assessment.  Reports feeling tired all day.  He attributed his tiredness to his current prescription with Provigil.  He stated that this medication "might not be strong enough and/or effective anymore."  Reports he is currently followed by PA Harold Hedge for medication management. Stated he was recently discharged for inpatient admission about 1 month ago, however stated " it's not important why he was admitted."  He reports he has a history with schizophrenia and hallucinations.  States he is prescribed Haldol which he reports he has been taking as directed. Stated that he started hearing voices States he has been taking his medications as indicated. Consult for decision making capacity was ordered due to patient's  refusal to have Foley catheter removed as indicated by urologist.  Paul Bray is resting in bed, safety sitter at bedside; he is alert/oriented x 4; calm/cooperative; and mood congruent with affect.  Patient is speaking in a clear tone at moderate volume, and normal pace; with good eye contact. his thought process is coherent and relevant; There is no indication that he is currently responding to internal/external stimuli or experiencing delusional thought content. He did reported that he was experiencing auditory hallucination however, denied that voice are command in nature. Reported that the voice had improved since this admission.   Patient denies suicidal/self-harm/homicidal ideation, psychosis, and paranoia.  Patient has remained calm throughout assessment and has answered questions appropriately.   Mental Capacity Assessment: I have evaluated the following areas to assess the Paul Bray's mental capacity regarding medical decision-making ability which pertains to competency to accept or refuse medical treatment. For the specific treatment or service in question is: "removal of foley catheter."   Communication: The patient was able to clearly state preferred treatment options for her medical care at this time. The patient was able to decide that he does-not want the removal of Foley catheters due to by mouth medications not been effective for the past 2 years. Javante stated that he has been on Flomax 8 mg for the past 2 years and "it is not helpful."  Reports feeling upset due to no other options has been presented after removal of Foley catheter. He reported he would like to speak to Urologist or who was able to provide multiple options after the Foley catheter has been removed.  Reported he was offered to continue the Flomax in addition to "starting a  new medication."  He is unable to recall the name of the medication that was presented.  Reports concerns that medication has not been  effective in the past, and his treatment team does not have a clear-cut plan after removal of Foley catheter.   Understanding: The patient was able to recall information, link causal relationships, and process general probabilities regarding life situations and medical treatment scenarios. He is able to paraphrase his view of the current situation and his thoughts about having this cathter removed. Paul Bray reported " they said that they are trying to add another medication to the Flomax, but they don't know if this will be helpful or not."   Appreciation: The patient was able to identify and describe his various illnesses and treatment options with potential outcomes. The patient did-not present with concerns such as denial or delusional thought-process.   Conclusion: At this time, there is insufficient evidence to warrant removal of the patient's rights for medical decision-making. We can clearly determine mental capacity for decision-making. Reid was placed under IVC due exacerbation of mental health symptoms.  Was reported that patient was uncooperative hostile during initial exam.  Experiencing command auditory hallucinations.  Continue to recommend inpatient admission once medically cleared.   HPI:" Paul Bray is a 57 y.o. male admitted medically for 06/20/2022  9:20 PM for an intentional beta-blocker overdose (unclear if suicidal intent - reportedly took for high BP and anxiety). 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 Prior hx polysubstance abuse (cocaine, heroin, MDMA)."   Past Psychiatric History: Generalized anxiety disorder, polysubstance abuse, schizophrenia with auditory hallucinations.  Risk to Self:   Risk to Others:   Prior Inpatient Therapy:   Prior Outpatient Therapy:    Past Medical History:  Past Medical History:  Diagnosis Date    ADHD    Anxiety    Brain tumor ([place])    balance and occ memory issues and headaches   Brain tumor ([place])    sees [facility] q year   Depression    Headache    migraine and tension   Hypothyroidism    Soft tissue mass    left shoulder   Substance abuse ([place])     Past Surgical History:  Procedure Laterality Date   colonscopy     gamma knife radiation 2018 for brain tumor'     hematoma removed from arm Left    MASS EXCISION Left 06/30/2019   Procedure: EXCISION OF SOFT TISSUE  MASS LEFT SHOULDER;  Surgeon: Armandina Gemma, MD;  Location: [facility];  Service: General;  Laterality: Left;  LMA   Family History: History reviewed. No pertinent family history. Family Psychiatric  History:  Social History:  Social History   Substance and Sexual Activity  Alcohol Use Not Currently   Comment: quit Oct 2019     Social History   Substance and Sexual Activity  Drug Use Not Currently   Types: Cocaine, IV, Heroin, MDMA (Ecstacy)   Comment: last reported use; cocaine and heroin in 2019; MDMA Feb 2023    Social History   Socioeconomic History   Marital status: Single    Spouse name: Not on file   Number of children: Not on file   Years of education: Not on file   Highest education level: Not on file  Occupational History   Not on file  Tobacco Use  Smoking status: Former    Types: Cigarettes   Smokeless tobacco: Current   Tobacco comments:    quit jan 2020  Vaping Use   Vaping Use: Every day  Substance and Sexual Activity   Alcohol use: Not Currently    Comment: quit Oct 2019   Drug use: Not Currently    Types: Cocaine, IV, Heroin, MDMA (Ecstacy)    Comment: last reported use; cocaine and heroin in 2019; MDMA Feb 2023   Sexual activity: Not Currently  Other Topics Concern   Not on file  Social History Narrative   Not on file   Social Determinants of Health   Financial Resource Strain: Not on file  Food Insecurity: Not on file  Transportation Needs:  Not on file  Physical Activity: Not on file  Stress: Not on file  Social Connections: Not on file   Additional Social History:    Allergies:   Allergies  Allergen Reactions   Ingrezza [Valbenazine Tosylate] Other (See Comments)    Severe tongue chewing at 80 mg Tolerates lower doses   Naloxone Palpitations    Pt report MAT Rx is Subutex/Buprenorphine only   Amphetamine-Dextroamphetamine Other (See Comments)    unknown    Labs:  Results for orders placed or performed during the hospital encounter of 06/20/22 (from the past 48 hour(s))  Lactic acid, plasma     Status: None   Collection Time: 06/21/22  5:13 PM  Result Value Ref Range   Lactic Acid, Venous 0.9 0.5 - 1.9 mmol/L    Comment: Performed at [facility], [address], [place], [place] [address]  MRSA Next Gen by PCR, Nasal     Status: None   Collection Time: 06/21/22  6:27 PM   Specimen: Nasal Mucosa; Nasal Swab  Result Value Ref Range   MRSA by PCR Next Gen NOT DETECTED NOT DETECTED    Comment: (NOTE) The GeneXpert MRSA Assay (FDA approved for NASAL specimens only), is one component of a comprehensive MRSA colonization surveillance program. It is not intended to diagnose MRSA infection nor to guide or monitor treatment for MRSA infections. Test performance is not FDA approved in patients less than 55 years old. Performed at [facility], [place] [address] [place], [place] [address]   TSH     Status: None   Collection Time: 06/22/22 12:25 AM  Result Value Ref Range   TSH 3.257 0.350 - 4.500 uIU/mL    Comment: Performed by a 3rd Generation assay with a functional sensitivity of <=0.01 uIU/mL. Performed at [facility], [place] [address], [place], [place] [address]   CBC     Status: Abnormal   Collection Time: 06/22/22 12:25 AM  Result Value Ref Range   WBC 4.6 4.0 - 10.5 K/uL   RBC 3.40 (L) 4.22 - 5.81 MIL/uL   Hemoglobin 11.0 (L) 13.0 - 17.0 g/dL   HCT 31.3 (L) 39.0 - 52.0 %   MCV 92.1 80.0  - 100.0 fL   MCH 32.4 26.0 - 34.0 pg   MCHC 35.1 30.0 - 36.0 g/dL   RDW 14.0 11.5 - 15.5 %   Platelets 161 150 - 400 K/uL   nRBC 0.0 0.0 - 0.2 %    Comment: Performed at [facility], [place] [address], [place], [place] [id number]  Basic metabolic panel     Status: Abnormal   Collection Time: 06/22/22 12:25 AM  Result Value Ref Range   Sodium 139 135 -  145 mmol/L   Potassium 3.7 3.5 - 5.1 mmol/L   Chloride 111 98 - 111 mmol/L   CO2 22 22 - 32 mmol/L   Glucose, Bld 113 (H) 70 - 99 mg/dL    Comment: Glucose reference range applies only to samples taken after fasting for at least 8 hours.   BUN 18 6 - 20 mg/dL   Creatinine, Ser 1.03 0.61 - 1.24 mg/dL   Calcium 7.6 (L) 8.9 - 10.3 mg/dL   GFR, Estimated >60 >60 mL/min    Comment: (NOTE) Calculated using the CKD-EPI Creatinine Equation (2021)    Anion gap 6 5 - 15    Comment: Performed at [facility] [address], [place], [place] [address]    Current Facility-Administered Medications  Medication Dose Route Frequency Provider Last Rate Last Admin   acetaminophen (TYLENOL) tablet 650 mg  650 mg Oral Q6H PRN Charlann Lange, MD   650 mg at 06/21/22 2157   Or   acetaminophen (TYLENOL) suppository 650 mg  650 mg Rectal Q6H PRN Charlann Lange, MD       buprenorphine (SUBUTEX) SL tablet 8 mg  8 mg Sublingual TID Nicoletta Dress, Na, MD   8 mg at 06/23/22 1004   Chlorhexidine Gluconate Cloth 2 % PADS 6 each  6 each Topical Daily Nicoletta Dress, Na, MD   6 each at 06/23/22 [id number]   diphenhydrAMINE (BENADRYL) capsule 25 mg  25 mg Oral Q8H PRN Charlann Lange, MD       doxycycline (VIBRAMYCIN) 50 MG capsule 50 mg  50 mg Oral Daily Irene Pap N, DO   50 mg at 06/23/22 0925   enoxaparin (LOVENOX) injection 40 mg  40 mg Subcutaneous Q24H Nicoletta Dress, Na, MD   40 mg at 06/22/22 1348   gabapentin (NEURONTIN) capsule 400 mg  400 mg Oral BID Nicoletta Dress, Na, MD   400 mg at 06/23/22 [id number]   haloperidol (HALDOL) tablet 15 mg  15 mg Oral QHS Cinderella, Margaret A   15 mg at 06/22/22 2120   hydrOXYzine  (ATARAX) tablet 25 mg  25 mg Oral BID AC & HS Nicoletta Dress, Na, MD   25 mg at 06/23/22 [id number]   levothyroxine (SYNTHROID) tablet 150 mcg  150 mcg Oral Daily Nicoletta Dress, Na, MD   150 mcg at 06/23/22 [id number]   modafinil (PROVIGIL) tablet 600 mg  600 mg Oral Daily Donne Hazel, MD   600 mg at 06/23/22 1003   nicotine (NICODERM CQ - dosed in mg/24 hours) patch 21 mg  21 mg Transdermal Daily Nicoletta Dress, Na, MD   21 mg at 06/23/22 1004   pantoprazole (PROTONIX) EC tablet 40 mg  40 mg Oral Daily Nicoletta Dress, Na, MD   40 mg at 06/23/22 [id number]   promethazine (PHENERGAN) tablet 12.5 mg  12.5 mg Oral Q6H PRN Nicoletta Dress, Na, MD       sodium chloride 0.9 % bolus 1,000 mL  1,000 mL Intravenous Once Nicoletta Dress, Na, MD       tamsulosin (FLOMAX) capsule 0.8 mg  0.8 mg Oral QHS Donne Hazel, MD        Musculoskeletal:   Psychiatric Specialty Exam:  Presentation  General Appearance:  Appropriate for Environment  Eye Contact: Good  Speech: Clear and Coherent  Speech Volume: Normal  Handedness: Right   Mood and Affect  Mood: -- (Frustrated)  Affect: -- (Irritable relatively fixed)   Thought Process  Thought Processes: Coherent; Linear  Descriptions of Associations:Intact  Orientation:Full (Time, Place and Person)  Thought  Content:Logical  History of Schizophrenia/Schizoaffective disorder:Yes  Duration of Psychotic Symptoms:Greater than six months  Hallucinations:Hallucinations: Auditory  Ideas of Reference:None  Suicidal Thoughts:Suicidal Thoughts: -- (Pt cagey, states + SI but "not often".) SI Passive Intent and/or Plan: Without Intent; Without Plan  Homicidal Thoughts:Homicidal Thoughts: No   Sensorium  Memory: Immediate Fair; Recent Fair  Judgment: Fair  Insight: Fair   Community education officer  Concentration: Fair  Attention Span: Fair  Recall: Good  Fund of Knowledge: Good  Language: Good   Psychomotor Activity  Psychomotor Activity: Psychomotor Activity: -- (mild tremor)   Assets   Assets: Leisure Time; Physical Health; Resilience; Social Support   Sleep  Sleep: Sleep: Fair   Physical Exam: Physical Exam ROS Blood pressure 118/76, pulse 89, temperature 97.6 F (36.4 C), temperature source Oral, resp. rate 18, height '5\' 10"'$  (1.778 m), weight 96.4 kg, SpO2 97 %. Body mass index is 30.49 kg/m.  Treatment Plan Summary: Daily contact with patient to assess and evaluate symptoms and progress in treatment and Medication management  Disposition: Recommend psychiatric Inpatient admission when medically cleared. -Continues to be recommended for inpatient admission once medically cleared due to possible suicide attempt with propranolol overdose.  -Patient was placed under involuntary commitment - Continue with Haloperidol 10 mg daily   -Psychiatry to continue to follow  Derrill [place], NP 06/23/2022 1:08 PM

## 2022-06-23 NOTE — Progress Notes (Signed)
Belongings brought to station from ED and placed in cabinet outside patient's room due to IVC.

## 2022-06-23 NOTE — Progress Notes (Signed)
  Progress Note   Patient: Paul Bray [id number] DOB: 1965/10/08 DOA: 06/20/2022     2 DOS: the patient was seen and examined on 06/23/2022   Brief hospital course: 57 y.o. male with medical history significant of schizophrenia with recurrent auditory hallucinations, hypothyroidism, anxiety, depression, ADHD, previous polysubstance abuse (IV heroin, cocaine, MDMA), primary malignant neoplasm of the cerebral ventricle, status post gamma knife radiation in 2018, who presented with propranolol overdose. Pt reportedly took 3 propranolol tabs PTA, required IVF bolus and glucagon infusion per recs of poison control  Assessment and Plan: Propranolol OD -BP and HR now stable -Had been on glucagon gtt, now stopped 2/26 as per recommendations of poison control -Remains stable this AM  Schizophrenia with auditory hallucinations -Psychiatry following -IVC per Psych -At this time, patient is medically stable for inpatient psychiatry if warranted -Psychiatry recommending inpatient psych admit -Cont haldol 30m qday  Anxiety and depression -Seems stable at present  Hx polysubstance abuse -Presenting UDS is negative   Hypothyroid -Presenting TSH normal at 3.257 -continue levothyroxine 1571m   ARF -renal function normalized after IVF  Obesity -recommend diet/lifestyle modification  Indwelling cath -Chart reviewed. Was placed in ED on 2/21 because of "retention" -On flomax -Renal US[place]orean 2/21, reviewed neg for hydronephrosis -today, discussed case with on call Urologist, Dr. BeGloriann Loanwho recommends voiding trial. Pt still refuses to have foley removed  Rosacea -Had been on metrogel topical -Chart reviewed, pt was recommended to stop doxycycline 07/07/21 to avoid photosensitivity on arms. Currently still on doxycycline and pt seems to have been on it chronically since at least 2021.      Subjective: Without complaints this AM  Physical Exam: Vitals:   06/22/22 1911 06/22/22 2012  06/23/22 0500 06/23/22 0634  BP: (!) 87/54 100/68  118/76  Pulse: 63   89  Resp: 17   18  Temp: (!) 97.4 F (36.3 C)   97.6 F (36.4 C)  TempSrc: Oral   Oral  SpO2: 96%   97%  Weight:   96.4 kg   Height:       General exam: Conversant, in no acute distress Respiratory system: normal chest rise, clear, no audible wheezing Cardiovascular system: regular rhythm, s1-s2 Gastrointestinal system: Nondistended, nontender, pos BS Central nervous system: No seizures, no tremors Extremities: No cyanosis, no joint deformities Skin: No rashes, no pallor Psychiatry: Affect normal // no auditory hallucinations   Data Reviewed:  There are no new results to review at this time.  Family Communication: Pt in room, family not at bedside  Disposition: Status is: Inpatient Remains inpatient appropriate because: Severity of illness  Planned Discharge Destination:  Inpatient psych     Author: StMarylu LundMD 06/23/2022 4:53 PM  For on call review [email or web address]

## 2022-06-23 NOTE — Plan of Care (Signed)
  Problem: Safety: Goal: Ability to remain free from injury will improve Outcome: Not Progressing   Problem: Elimination: Goal: Will not experience complications related to bowel motility Outcome: Not Progressing

## 2022-06-24 DIAGNOSIS — T447X4A Poisoning by beta-adrenoreceptor antagonists, undetermined, initial encounter: Secondary | ICD-10-CM | POA: Diagnosis not present

## 2022-06-24 MED ORDER — HALOPERIDOL 5 MG PO TABS
20.0000 mg | ORAL_TABLET | Freq: Every day | ORAL | Status: DC
  Administered 2022-06-24: 20 mg via ORAL
  Filled 2022-06-24 (×2): qty 4

## 2022-06-24 MED ORDER — MODAFINIL 100 MG PO TABS
300.0000 mg | ORAL_TABLET | Freq: Every day | ORAL | Status: DC
  Administered 2022-06-25 – 2022-06-29 (×5): 300 mg via ORAL
  Filled 2022-06-24 (×5): qty 3

## 2022-06-24 NOTE — Plan of Care (Signed)
  Problem: Safety: Goal: Ability to remain free from injury will improve Outcome: Not Progressing   Problem: Elimination: Goal: Will not experience complications related to bowel motility Outcome: Not Progressing

## 2022-06-24 NOTE — Care Management Important Message (Signed)
Important Message  Patient Details  Name: Paul Bray MRN: [id number] Date of Birth: [birth year]   Medicare Important Message Given:  Yes     Hannah Beat 06/24/2022, 11:25 AM

## 2022-06-24 NOTE — Progress Notes (Signed)
PROGRESS NOTE    Paul Bray  [id number] DOB: Feb 11, 1966 DOA: 06/20/2022 PCP: Jola Baptist, PA-C    Brief Narrative:   Paul Bray is a 57 y.o. 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 ED on 2/24 with complaint of auditory hallucinations and suicidal ideation.  Patient reported that he heard voices in his head more frequently and that day prior to admission these voices instruct him to take extra doses of his home propranolol.  He reportedly took 3 propranolol doses in order to acquiesce to these voices.  EDP discussed case with [facility] and poison control.  Poison control recommended initiation of glucagon drip and medical admission for observation.  TRH was consulted for admission for suicidal ideation with propranolol overdose.  Assessment & Plan:   Propranolol overdose Patient presenting to the ED with increased auditory hallucinations with suicidal ideations in which he took 3 doses of his propranolol.  Patient was hypotensive with BP 90/72 on admission with heart rate 63.  Patient was given IV fluid bolus and was placed on glucagon drip per recommendations of poison control per discussion with ED physician.  Glucagon drip was titrated off on 06/22/2022 and BP and heart rate has remained stable.  Schizophrenia with auditory hallucinations Suicidal ideation Patient recently admitted to inpatient psychiatry.  Continues with auditory hallucinations and with suicidal ideation with propranolol overdose as above. -- Psychiatry following, appreciate assistance -- Remains under IVC -- Haldol 20 mg p.o. daily -- Continue 1:1 sitter; suicide precautions -- Further per psychiatry, remains medically stable for discharge to  inpatient psych  Hx essential hypertension Patient prescribed propranolol 20 mg p.o. daily as needed for "blood pressure".  Given his attempted overdose with blood pressure remaining stable during hospitalization, 127/95 this morning, will discontinue.  History of polysubstance abuse UDS on admission negative  Hypothyroidism TSH 3.257 on admission, within normal limits. --Levothyroxine 150 mcg p.o. daily  Acute renal failure: Resolved Creatinine 1.4 at admission.  Etiology likely prerenal azotemia in the setting of overdose as above.  Supported with IV fluid hydration with improvement of creatinine to 1.03.  Urinary retention Patient had Foley catheter placed on 06/17/2022 due to urinary retention in the ED.  Renal ultrasound 06/17/2022 negative for hydronephrosis.  Case was discussed with urology, Dr. Gloriann Loan by previous hospitalist who recommended voiding trial; patient declined removal -- Order for Foley catheter removal today -- Tamsulosin 0 point milligrams p.o. daily -- Monitor urinary output  Tobacco use disorder Counseled on need for abstinence/cessation -- Nicotine patch    DVT prophylaxis: enoxaparin (LOVENOX) injection 40 mg Start: 06/21/22 1300 SCDs Start: 06/21/22 [id number]    Code Status: Full Code Family Communication: No family present at bedside this morning  Disposition Plan:  Level of care: Telemetry Medical Status is: Inpatient Remains inpatient appropriate because: Under IVC, medically stable for discharge inpatient psych, further per psychiatry    Consultants:  Psychiatry Urology, Dr. Gloriann Loan -discussed with previous hospitalist, Dr. Wyline Copas  Procedures:  None  Antimicrobials:  None   Subjective: Patient seen examined bedside, resting comfortably.  Sleeping on his side but arousable.  Sitter present at bedside.  Continues to endorse auditory hallucinations.  He reports they are just "rambling away".  Does not tell me any specificities.  Seen by psychiatry, Dr.  Lovette Cliche this morning; per her report he  continues to endorse suicidal ideations today.  Remains on IVC.  Heart rate, blood pressure and renal function have been stable since 2/26 and medically stable for discharge to inpatient psych.  Discussed will remove Foley catheter today.  Order placed.  No other questions or concerns at this time.  Denies headache, no chest pain, no shortness of breath, no abdominal pain.  No acute events overnight per nursing staff.  Objective: Vitals:   06/23/22 2021 06/24/22 0500 06/24/22 0626 06/24/22 0811  BP: 123/75  (!) 127/95 125/89  Pulse: 77  80 78  Resp: '16  18 20  '$ Temp: 98.6 F (37 C)  98 F (36.7 C) 98 F (36.7 C)  TempSrc: Oral  Oral Oral  SpO2: 98%  97% 98%  Weight:  94.7 kg    Height:       No intake or output data in the 24 hours ending 06/24/22 1407 Filed Weights   06/22/22 0614 06/23/22 0500 06/24/22 0500  Weight: 96.3 kg 96.4 kg 94.7 kg    Examination:  Physical Exam: GEN: NAD, alert and oriented x 3, chronically ill appearance, appears older than stated age HEENT: NCAT, PERRL, EOMI, sclera clear, MMM PULM: CTAB w/o wheezes/crackles, normal respiratory effort, on room air  CV: RRR w/o M/G/R GI: abd soft, NTND, + BS GU: Foley catheter noted in place, draining clear yellow urine in collection canister MSK: no peripheral edema, moves all extremities independently NEURO: No focal deficits PSYCH: + Auditory hallucinations Integumentary: dry/intact, no rashes or wounds    Data Reviewed: I have personally reviewed following labs and imaging studies  CBC: Recent Labs  Lab 06/20/22 2140 06/21/22 1203 06/22/22 0025  WBC 10.3 7.7 4.6  HGB 15.6 12.2* 11.0*  HCT 44.0 35.6* 31.3*  MCV 90.5 92.7 92.1  PLT 325 207 [id number]   Basic Metabolic Panel: Recent Labs  Lab 06/20/22 2140 06/21/22 1203 06/22/22 0025  NA 136  --  139  K 4.3  --  3.7  CL 99  --  111  CO2 24  --  22  GLUCOSE 160*  --  113*  BUN 18  --  18  CREATININE 1.48*  1.17 1.03  CALCIUM 9.3  --  7.6*   GFR: Estimated Creatinine Clearance: 94.7 mL/min (by C-G formula based on SCr of 1.03 mg/dL). Liver Function Tests: Recent Labs  Lab 06/20/22 2140  AST 27  ALT 27  ALKPHOS 74  BILITOT 0.9  PROT 7.0  ALBUMIN 4.4   No results for input(s): "LIPASE", "AMYLASE" in the last 168 hours. No results for input(s): "AMMONIA" in the last 168 hours. Coagulation Profile: Recent Labs  Lab 06/20/22 2351  INR 1.0   Cardiac Enzymes: No results for input(s): "CKTOTAL", "CKMB", "CKMBINDEX", "TROPONINI" in the last 168 hours. BNP (last 3 results) No results for input(s): "PROBNP" in the last 8760 hours. HbA1C: No results for input(s): "HGBA1C" in the last 72 hours. CBG: No results for input(s): "GLUCAP" in the last 168 hours. Lipid Profile: No results for input(s): "CHOL", "HDL", "LDLCALC", "TRIG", "CHOLHDL", "LDLDIRECT" in the last 72 hours. Thyroid Function Tests: Recent Labs    06/22/22 0025  TSH 3.257   Anemia Panel: No results for input(s): "VITAMINB12", "FOLATE", "FERRITIN", "TIBC", "IRON", "RETICCTPCT" in the last 72 hours. Sepsis Labs: Recent Labs  Lab 06/20/22 2351 06/21/22 0311 06/21/22 1713  LATICACIDVEN 2.3* 0.9 0.9    Recent Results (from the past 240 hour(s))  Blood Culture (routine single)     Status: None (Preliminary  result)   Collection Time: 06/20/22 11:51 PM   Specimen: BLOOD LEFT HAND  Result Value Ref Range Status   Specimen Description BLOOD LEFT HAND  Final   Special Requests   Final    BOTTLES DRAWN AEROBIC ONLY Blood Culture results may not be optimal due to an inadequate volume of blood received in culture bottles   Culture   Final    NO GROWTH 2 DAYS Performed at [facility], [place] [address] [place], [place] [address]    Report Status PENDING  Incomplete  MRSA Next Gen by PCR, Nasal     Status: None   Collection Time: 06/21/22  6:27 PM   Specimen: Nasal Mucosa; Nasal Swab  Result Value Ref Range Status    MRSA by PCR Next Gen NOT DETECTED NOT DETECTED Final    Comment: (NOTE) The GeneXpert MRSA Assay (FDA approved for NASAL specimens only), is one component of a comprehensive MRSA colonization surveillance program. It is not intended to diagnose MRSA infection nor to guide or monitor treatment for MRSA infections. Test performance is not FDA approved in patients less than 72 years old. Performed at [facility], [place] [address], [place], [place] [address]          Radiology Studies: No results found.      Scheduled Meds:  buprenorphine  8 mg Sublingual TID   Chlorhexidine Gluconate Cloth  6 each Topical Daily   doxycycline  50 mg Oral Daily   enoxaparin (LOVENOX) injection  40 mg Subcutaneous Q24H   gabapentin  400 mg Oral BID   haloperidol  20 mg Oral QHS   hydrOXYzine  25 mg Oral BID AC & HS   levothyroxine  150 mcg Oral Daily   [START ON 06/25/2022] modafinil  300 mg Oral Daily   nicotine  21 mg Transdermal Daily   pantoprazole  40 mg Oral Daily   tamsulosin  0.8 mg Oral QHS   Continuous Infusions:  sodium chloride       LOS: 3 days    Time spent: 51 minutes spent on chart review, discussion with nursing staff, consultants, updating family and interview/physical exam; more than 50% of that time was spent in counseling and/or coordination of care.    Luka Stohr J [place] DO [facility] Available via Epic secure chat 7am-7pm After these hours, please refer to coverage provider listed on [email or web address] 06/24/2022, 2:07 PM

## 2022-06-24 NOTE — Consult Note (Signed)
[facility] ASSESSMENT   Reason for Consult:  psychosis/OD Referring Physician:  Dr. Nicoletta Dress Patient Identification: Paul Bray MRN:  [id number] ED Chief Complaint: Propranolol overdose  Diagnosis:  Principal Problem:   Propranolol overdose Active Problems:   Acquired hypothyroidism   Paranoid schizophrenia ([place])   Undifferentiated schizophrenia ([place])   Stimulant use disorder   GAD (generalized anxiety disorder)   Encounter for monitoring Suboxone maintenance therapy   Uncomplicated opioid dependence ([place])   Opioid use disorder, moderate, on maintenance therapy (HCC)   Cigarette nicotine dependence without complication   AKI (acute kidney injury) ([place])   Lactic acidemia   Obesity (BMI 30-39.9)   Auditory hallucination   Anxiety   Attention deficit hyperactivity disorder (ADHD)   Drug overdose   Hypotension   Adverse reaction to beta-blocker   ED Assessment Time Calculation: Start Time: 1100 Stop Time: 1200 Total Time in Minutes (Assessment Completion): 60   HPI:   Paul Bray is a 57 y.o. male patient admitted medically for 06/20/2022  9:20 PM for an intentional beta-blocker overdose (unclear if suicidal intent - reportedly took for high BP and anxiety). 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 Prior hx polysubstance abuse (cocaine, heroin, MDMA)."    Subjective:   Patient seen at [facility] for face to face psychiatric evaluation. Patient's mother, Paul Bray, is also in the room and patient requests she stay for assessment.   Pt is willing to engage in assessment, however he is irritable, preservates on urology concerns and his last IP treatment at [facility], and is easily agitated. Pt immediately begins speaking about how he is being mistreated, how is medications are incorrect, and how he is refusing to remove his catheter. I read over his current  medications and the dosages, in which the patient and mother agreed that they are all correct and at the correct dosing. Pt stated, "now that my Tamsulosin is finally at 0.8 mg where it should of been the entire time, I will take my catheter out in approximately 5 days. I am going to remain right here in this room until my voices are gone and this catheter can come out. I refuse to go anywhere."   Pt does continue to endorse vague auditory hallucinations. Describes them as "low in volume" and unable to understand what they are saying. He denies CAH. He denies VH. He denies auditory or visual hallucinations. Denies any feelings of paranoia. I discussed with patient that his AH hallucinations continue to improve with increase in Haldol dosing. Historically patient has been stabilized on Haldol 20 mg. Pt denying side effects of tremor, restless legs, muscle cramping, N/V/D. We agreed to increase Haldol to 20 mg starting tonight.   Spoke with patient and mother about current treatment options. Ultimately patient does not meet criteria for [place] IVC. I continued to offer inpatient psychiatric treatment, however he continued to decline. I attempted to educate patient multiple times on decisions at hand, and expressed he has been cleared to remove catheter, and if he removes catheter than we can work on getting him transferred to IP unit to further stabilize on Haldol in psychiatric supervised setting. He continued to decline this, refusing to remove his catheter until five days from now, and refusing to want to transfer to IP unit. Pt seen at [facility] for medication noncompliance, substance abuse, and hallucinations on 05/25/22 and transferred to [facility] for  IP treatment. He mentioned multiple times how [facility] treated him poorly, and he never wishes to return there or any inpatient unit. Pt and his mother continued to bring up urology concerns, in which I had to redirect them back to psychiatric conversations multiple times.  It appears there main concerns are with urology and not psychiatry at this time.   Mother did state she is not comfortable with the patient returning to her home at this time. She states he can return when his AH are fully resolved. She did express understanding that he will likely be homeless if he is medically cleared for discharge today. Pt also expressed understanding of this. Again, it appears mother's main concerns are with him not leaving the hospital until he is seen further by urology, and not with psychiatry as she continued to ask me urology questions during psychiatric assessment.  I expressed this to his treatment team, Dr. [place] who informed me patient was seen by Dr. Gloriann Loan with urology yesterday, and the recommendation was to remove foley catheter. An order has been placed to remove catheter, however patient is still disagreeing.   Pt does not meet current [place] IVC criteria. Inpatient psychiatric treatment was offered many times, however patient continued to decline and continues to refuse removal of foley catheter. Pt capacity was assessed yesterday by Ricky Ala, who feels he does have capacity to make his own decisions in which I am agreeable with. Pt has shown improvements daily with hallucinations likely due to haldol increase. Will increase Haldol to 20 mg Qhs, and psychiatrically clear patient with close OP psychiatric follow up. Resources added to AVS. Will request case manager/SW discuss with patient about housing/shelter options if mother does not let him return home.   Past Psychiatric History:  See above  Risk to Self or Others: Is the patient at risk to self? No Has the patient been a risk to self in the past 6 months? Yes Has the patient been a risk to self within the distant past? Yes Is the patient a risk to others? No Has the patient been a risk to others in the past 6 months? No Has the patient been a risk to others within the distant past? No  [place] Scale:   Flowsheet Row ED to Hosp-Admission (Current) from 06/20/2022 in [place] ED from 06/13/2022 in [facility] Emergency Department at [facility] ED from 05/25/2022 in [facility] Emergency Department at [place] No Risk No Risk       Past Medical History:  Past Medical History:  Diagnosis Date   ADHD    Anxiety    Brain tumor ([place])    balance and occ memory issues and headaches   Brain tumor ([place])    sees [facility] q year   Depression    Headache    migraine and tension   Hypothyroidism    Soft tissue mass    left shoulder   Substance abuse [facility])     Past Surgical History:  Procedure Laterality Date   colonscopy     gamma knife radiation 2018 for brain tumor'     hematoma removed from arm Left    MASS EXCISION Left 06/30/2019   Procedure: EXCISION OF SOFT TISSUE  MASS LEFT SHOULDER;  Surgeon: Armandina Gemma, MD;  Location: [place];  Service: General;  Laterality: Left;  LMA   Family History: History reviewed.  No pertinent family history. Social History:  Social History   Substance and Sexual Activity  Alcohol Use Not Currently   Comment: quit Oct 2019     Social History   Substance and Sexual Activity  Drug Use Not Currently   Types: Cocaine, IV, Heroin, MDMA (Ecstacy)   Comment: last reported use; cocaine and heroin in 2019; MDMA Feb 2023    Social History   Socioeconomic History   Marital status: Single    Spouse name: Not on file   Number of children: Not on file   Years of education: Not on file   Highest education level: Not on file  Occupational History   Not on file  Tobacco Use   Smoking status: Former    Types: Cigarettes   Smokeless tobacco: Current   Tobacco comments:    quit jan 2020  Vaping Use   Vaping Use: Every day  Substance and Sexual Activity   Alcohol use: Not Currently    Comment: quit Oct 2019   Drug use: Not Currently     Types: Cocaine, IV, Heroin, MDMA (Ecstacy)    Comment: last reported use; cocaine and heroin in 2019; MDMA Feb 2023   Sexual activity: Not Currently  Other Topics Concern   Not on file  Social History Narrative   Not on file   Social Determinants of Health   Financial Resource Strain: Not on file  Food Insecurity: Not on file  Transportation Needs: Not on file  Physical Activity: Not on file  Stress: Not on file  Social Connections: Not on file   Allergies:   Allergies  Allergen Reactions   Ingrezza [Valbenazine Tosylate] Other (See Comments)    Severe tongue chewing at 80 mg Tolerates lower doses   Naloxone Palpitations    Pt report MAT Rx is Subutex/Buprenorphine only   Amphetamine-Dextroamphetamine Other (See Comments)    unknown    Labs: No results found for this or any previous visit (from the past 48 hour(s)).  Current Facility-Administered Medications  Medication Dose Route Frequency Provider Last Rate Last Admin   acetaminophen (TYLENOL) tablet 650 mg  650 mg Oral Q6H PRN Nicoletta Dress, Na, MD   650 mg at 06/23/22 1323   Or   acetaminophen (TYLENOL) suppository 650 mg  650 mg Rectal Q6H PRN Charlann Lange, MD       buprenorphine (SUBUTEX) SL tablet 8 mg  8 mg Sublingual TID Nicoletta Dress, Na, MD   8 mg at 06/24/22 1029   Chlorhexidine Gluconate Cloth 2 % PADS 6 each  6 each Topical Daily Nicoletta Dress, Na, MD   6 each at 06/24/22 1032   diphenhydrAMINE (BENADRYL) capsule 25 mg  25 mg Oral Q8H PRN Nicoletta Dress, Na, MD       doxycycline (VIBRAMYCIN) 50 MG capsule 50 mg  50 mg Oral Daily Hall, Carole N, DO   50 mg at 06/24/22 1029   enoxaparin (LOVENOX) injection 40 mg  40 mg Subcutaneous Q24H Li, Na, MD   40 mg at 06/23/22 1323   gabapentin (NEURONTIN) capsule 400 mg  400 mg Oral BID Nicoletta Dress, Na, MD   400 mg at 06/24/22 1029   haloperidol (HALDOL) tablet 20 mg  20 mg Oral QHS [name], NP       hydrOXYzine (ATARAX) tablet 25 mg  25 mg Oral BID AC & HS Li, Na, MD   25 mg at 06/24/22 1029   levothyroxine (SYNTHROID)  tablet 150 mcg  150 mcg Oral Daily Charlann Lange, MD  150 mcg at 06/24/22 [id number]   modafinil (PROVIGIL) tablet 600 mg  600 mg Oral Daily Donne Hazel, MD   600 mg at 06/24/22 1028   nicotine (NICODERM CQ - dosed in mg/24 hours) patch 21 mg  21 mg Transdermal Daily Nicoletta Dress, Na, MD   21 mg at 06/24/22 1030   pantoprazole (PROTONIX) EC tablet 40 mg  40 mg Oral Daily Nicoletta Dress, Na, MD   40 mg at 06/24/22 1029   promethazine (PHENERGAN) tablet 12.5 mg  12.5 mg Oral Q6H PRN Nicoletta Dress, Na, MD       sodium chloride 0.9 % bolus 1,000 mL  1,000 mL Intravenous Once Nicoletta Dress, Na, MD       tamsulosin (FLOMAX) capsule 0.8 mg  0.8 mg Oral QHS Donne Hazel, MD   0.8 mg at 06/23/22 2120    Psychiatric Specialty Exam: Presentation  General Appearance:  Fairly Groomed  Eye Contact: Good  Speech: Clear and Coherent  Speech Volume: Normal  Handedness: Right   Mood and Affect  Mood: Irritable  Affect: Congruent   Thought Process  Thought Processes: Coherent  Descriptions of Associations:Intact  Orientation:Full (Time, Place and Person)  Thought Content:WDL  History of Schizophrenia/Schizoaffective disorder:Yes  Duration of Psychotic Symptoms:Greater than six months  Hallucinations:Hallucinations: Auditory Description of Auditory Hallucinations: "very soft. I don't know what they are saying its not anything specific"  Ideas of Reference:None  Suicidal Thoughts:Suicidal Thoughts: No  Homicidal Thoughts:Homicidal Thoughts: No   Sensorium  Memory: Immediate Fair; Recent Fair  Judgment: Fair  Insight: Fair   Community education officer  Concentration: Fair  Attention Span: Fair  Recall: Good  Fund of Knowledge: Good  Language: Good   Psychomotor Activity  Psychomotor Activity: Psychomotor Activity: Restlessness   Assets  Assets: Desire for Improvement; Physical Health; Resilience    Sleep  Sleep: Sleep: Fair   Physical Exam: Physical Exam Neurological:     Mental Status:  He is alert and oriented to person, place, and time.  Psychiatric:        Attention and Perception: He perceives auditory hallucinations.        Mood and Affect: Affect is angry.        Speech: Speech normal.        Behavior: Behavior is agitated.        Thought Content: Thought content normal.    Review of Systems  Constitutional:        Catheter  Psychiatric/Behavioral:  Positive for hallucinations.    Blood pressure 125/89, pulse 78, temperature 98 F (36.7 C), temperature source Oral, resp. rate 20, height '5\' 10"'$  (1.778 m), weight 94.7 kg, SpO2 98 %. Body mass index is 29.96 kg/m.  Medical Decision Making: Pt case reviewed and discussed with Dr. Lovette Cliche. Pt does not meet [place] IVC criteria, and is refusing psychiatric inpatient treatment. Will psychiatrically clear patient and recommend medication compliance with close OP follow up.  - Haldol 20 mg Qhs - resources provided in AVS include therapy, OP follow up, substance abuse treatment, shelter resources - please have case management/SW follow up with patient about further possible shelter/housing resources    Disposition: No evidence of imminent risk to self or others at present.   Patient does not meet criteria for psychiatric inpatient admission. Supportive therapy provided about ongoing stressors. Discussed crisis plan, support from social network, calling 911, coming to the Emergency Department, and calling Suicide Hotline.  [name], NP 06/24/2022 11:09 AM

## 2022-06-24 NOTE — Plan of Care (Signed)

## 2022-06-24 NOTE — Social Work (Signed)
CSW continues to follow and review chart for disposition and ongoing needs. Major barrier continues to be the foley. CSW confirmed with Attending that IVC will remain in place at this time. Will follow for inpatient Psych referrals if indicated. TOC will continue to follow.

## 2022-06-24 NOTE — Discharge Instructions (Signed)

## 2022-06-25 ENCOUNTER — Encounter (HOSPITAL_COMMUNITY): Payer: Self-pay | Admitting: Medical

## 2022-06-25 ENCOUNTER — Ambulatory Visit (HOSPITAL_BASED_OUTPATIENT_CLINIC_OR_DEPARTMENT_OTHER): Payer: Medicare Other | Admitting: Medical

## 2022-06-25 ENCOUNTER — Encounter (HOSPITAL_COMMUNITY): Payer: Self-pay

## 2022-06-25 DIAGNOSIS — L719 Rosacea, unspecified: Secondary | ICD-10-CM

## 2022-06-25 DIAGNOSIS — F1920 Other psychoactive substance dependence, uncomplicated: Secondary | ICD-10-CM

## 2022-06-25 DIAGNOSIS — G250 Essential tremor: Secondary | ICD-10-CM

## 2022-06-25 DIAGNOSIS — F200 Paranoid schizophrenia: Secondary | ICD-10-CM

## 2022-06-25 DIAGNOSIS — F060 Psychotic disorder with hallucinations due to known physiological condition: Secondary | ICD-10-CM

## 2022-06-25 DIAGNOSIS — N401 Enlarged prostate with lower urinary tract symptoms: Secondary | ICD-10-CM

## 2022-06-25 DIAGNOSIS — G4710 Hypersomnia, unspecified: Secondary | ICD-10-CM

## 2022-06-25 DIAGNOSIS — Z91148 Patient's other noncompliance with medication regimen for other reason: Secondary | ICD-10-CM

## 2022-06-25 DIAGNOSIS — G4730 Sleep apnea, unspecified: Secondary | ICD-10-CM

## 2022-06-25 DIAGNOSIS — I6381 Other cerebral infarction due to occlusion or stenosis of small artery: Secondary | ICD-10-CM

## 2022-06-25 DIAGNOSIS — I10 Essential (primary) hypertension: Secondary | ICD-10-CM

## 2022-06-25 DIAGNOSIS — D320 Benign neoplasm of cerebral meninges: Secondary | ICD-10-CM

## 2022-06-25 DIAGNOSIS — E039 Hypothyroidism, unspecified: Secondary | ICD-10-CM

## 2022-06-25 DIAGNOSIS — N138 Other obstructive and reflux uropathy: Secondary | ICD-10-CM

## 2022-06-25 DIAGNOSIS — R338 Other retention of urine: Secondary | ICD-10-CM

## 2022-06-25 DIAGNOSIS — Z923 Personal history of irradiation: Secondary | ICD-10-CM

## 2022-06-25 MED ORDER — HALOPERIDOL 5 MG PO TABS
10.0000 mg | ORAL_TABLET | Freq: Every day | ORAL | Status: DC
  Administered 2022-06-25 – 2022-06-27 (×3): 10 mg via ORAL
  Filled 2022-06-25 (×4): qty 2

## 2022-06-25 MED ORDER — LIDOCAINE HCL URETHRAL/MUCOSAL 2 % EX GEL
1.0000 | Freq: Once | CUTANEOUS | Status: AC
  Administered 2022-06-25: 1 via URETHRAL
  Filled 2022-06-25: qty 6

## 2022-06-25 MED ORDER — FINASTERIDE 5 MG PO TABS
5.0000 mg | ORAL_TABLET | Freq: Every day | ORAL | Status: DC
  Administered 2022-06-25 – 2022-06-29 (×5): 5 mg via ORAL
  Filled 2022-06-25 (×5): qty 1

## 2022-06-25 NOTE — Consult Note (Signed)
Urology Consult   Physician requesting consult: Eric [place] DO  Reason for consult: Urinary retention  History of Present Illness: Paul Bray is a 57 y.o. who is a patient of Dr. Gloriann Loan with a past medical history significant for schizophrenia with recurrent auditory hallucinations, hypothyroidism, anxiety/depression, ADHD, previous polysubstance abuse, primary malignant neoplasm cerebral ventricle s/p radiation in 2018 2 presented to [facility] on 06/20/2022 with complaints of auditory hallucinations and suicidal ideation.  He was found to have a propranolol overdose.  He had Foley catheter placed on 06/17/2022 due to urinary retention in the emergency department.  Renal ultrasound 06/17/2022 was negative for hydronephrosis.  At the time, the case was discussed with Dr. Gloriann Loan with urology who recommended voiding trial.  Patient initially declined removal and then failed void trial on 06/25/2022.  He refused to have Foley catheter replaced by nursing staff or primary team.  Patient states that over the past several months prior to admission, he has had a weak flow stream with frequency and sensation mixed with bladder emptying.  He notes a history of BPH and has been taking tamsulosin 0.8 mg daily.  He last saw Dr. Gloriann Loan in the office in 2022 with lower urinary tract symptoms and testicular discomfort.  Past Medical History:  Diagnosis Date   ADHD    Anxiety    Brain tumor ([place])    balance and occ memory issues and headaches   Brain tumor ([place])    sees [facility] q year   Depression    Headache    migraine and tension   Hypothyroidism    Soft tissue mass    left shoulder   Substance abuse ([place])     Past Surgical History:  Procedure Laterality Date   colonscopy     gamma knife radiation 2018 for brain tumor'     hematoma removed from arm Left    MASS EXCISION Left 06/30/2019   Procedure: EXCISION OF SOFT TISSUE  MASS LEFT SHOULDER;  Surgeon: Armandina Gemma, MD;  Location: [facility];  Service: General;  Laterality: Left;  LMA    Current Hospital Medications:  Home Meds:  No current facility-administered medications on file prior to encounter.   Current Outpatient Medications on File Prior to Encounter  Medication Sig Dispense Refill   buprenorphine (SUBUTEX) 8 MG SUBL SL tablet Place 8 mg under the tongue 3 (three) times daily.     diphenhydrAMINE (BENADRYL) 50 MG tablet Take 2-4 tablets q6h as needed for restlessness related to Haldol (Patient taking differently: Take 25 mg by mouth every 8 (eight) hours as needed (For restlessness per patient).) 100 tablet 2   gabapentin (NEURONTIN) 400 MG capsule Take 1 capsule (400 mg total) by mouth 2 (two) times daily for 180 doses. 180 capsule 0   haloperidol (HALDOL) 10 MG tablet Take 1 tablet (10 mg total) by mouth daily. (Patient taking differently: Take 10 mg by mouth at bedtime.) 90 tablet 0   hydrOXYzine (ATARAX) 25 MG tablet Take 25 mg by mouth in the morning and at bedtime.     levothyroxine (SYNTHROID) 150 MCG tablet Take 1 tablet (150 mcg total) by mouth daily. 30 tablet 11   modafinil (PROVIGIL) 200 MG tablet Take 600 mg by mouth daily.     nicotine (NICODERM CQ - DOSED IN MG/24 HOURS) 21 mg/24hr patch Place 21 mg onto the skin daily.     propranolol (INDERAL) 20 MG tablet Take 20 mg by mouth daily as  needed (For blood pressure).     tamsulosin (FLOMAX) 0.4 MG CAPS capsule Take 1 capsule (0.4 mg total) by mouth at bedtime. 30 capsule 0     Scheduled Meds:  buprenorphine  8 mg Sublingual TID   Chlorhexidine Gluconate Cloth  6 each Topical Daily   doxycycline  50 mg Oral Daily   enoxaparin (LOVENOX) injection  40 mg Subcutaneous Q24H   finasteride  5 mg Oral Daily   gabapentin  400 mg Oral BID   haloperidol  20 mg Oral QHS   hydrOXYzine  25 mg Oral BID AC & HS   levothyroxine  150 mcg Oral Daily   modafinil  300 mg Oral Daily   nicotine  21 mg Transdermal Daily   pantoprazole  40 mg Oral  Daily   tamsulosin  0.8 mg Oral QHS   Continuous Infusions:  sodium chloride     PRN Meds:.acetaminophen **OR** acetaminophen, diphenhydrAMINE, promethazine  Allergies:  Allergies  Allergen Reactions   Ingrezza [Valbenazine Tosylate] Other (See Comments)    Severe tongue chewing at 80 mg Tolerates lower doses   Naloxone Palpitations    Pt report MAT Rx is Subutex/Buprenorphine only   Amphetamine-Dextroamphetamine Other (See Comments)    unknown    History reviewed. No pertinent family history.  Social History:  reports that he has quit smoking. His smoking use included cigarettes. He uses smokeless tobacco. He reports that he does not currently use alcohol. He reports that he does not currently use drugs after having used the following drugs: Cocaine, IV, Heroin, and MDMA (Ecstacy).  ROS: A complete review of systems was performed.  All systems are negative except for pertinent findings as noted.  Physical Exam:  Vital signs in last 24 hours: Temp:  [98.3 F (36.8 C)-99.9 F (37.7 C)] 98.3 F (36.8 C) (02/29 0615) Pulse Rate:  [82-87] 82 (02/29 0615) Resp:  [17-20] 17 (02/29 0615) BP: (100-104)/(62-72) 103/69 (02/29 0615) SpO2:  [94 %-97 %] 95 % (02/29 0615) Constitutional:  Alert and oriented, No acute distress Cardiovascular: Regular rate and rhythm Respiratory: Normal respiratory effort, Lungs clear bilaterally GI: Abdomen is soft, nontender, nondistended, no abdominal masses GU: No CVA tenderness Neurologic: Grossly intact, no focal deficits Psychiatric: Normal mood and affect  Laboratory Data:  No results for input(s): "WBC", "HGB", "HCT", "PLT" in the last 72 hours.  No results for input(s): "NA", "K", "CL", "GLUCOSE", "BUN", "CALCIUM", "CREATININE" in the last 72 hours.  Invalid input(s): "CO3"   No results found for this or any previous visit (from the past 24 hour(s)). Recent Results (from the past 240 hour(s))  Blood Culture (routine single)      Status: None (Preliminary result)   Collection Time: 06/20/22 11:51 PM   Specimen: BLOOD LEFT HAND  Result Value Ref Range Status   Specimen Description BLOOD LEFT HAND  Final   Special Requests   Final    BOTTLES DRAWN AEROBIC ONLY Blood Culture results may not be optimal due to an inadequate volume of blood received in culture bottles   Culture   Final    NO GROWTH 2 DAYS Performed at [facility], [place] [address], [place], [place] [address]    Report Status PENDING  Incomplete  MRSA Next Gen by PCR, Nasal     Status: None   Collection Time: 06/21/22  6:27 PM   Specimen: Nasal Mucosa; Nasal Swab  Result Value Ref Range Status   MRSA by PCR Next Gen NOT DETECTED NOT DETECTED  Final    Comment: (NOTE) The GeneXpert MRSA Assay (FDA approved for NASAL specimens only), is one component of a comprehensive MRSA colonization surveillance program. It is not intended to diagnose MRSA infection nor to guide or monitor treatment for MRSA infections. Test performance is not FDA approved in patients less than 56 years old. Performed at [facility], [place] [address], [place], [place] [address]     Renal Function: Recent Labs    06/20/22 2140 06/21/22 1203 06/22/22 0025  CREATININE 1.48* 1.17 1.03   Estimated Creatinine Clearance: 94.7 mL/min (by C-G formula based on SCr of 1.03 mg/dL).  Radiologic Imaging: No results found.  I independently reviewed the above imaging studies.  Procedure note: Under sterile conditions, his penis was prepped and draped.  16 French coud Foley catheter was both placed without difficulty with immediate return of 1.1 L clear yellow urine.  Impression/Recommendation Acute urinary retention: Etiology likely multifactorial with history of BPH and antipsychotic medications. 2.  Prostatic hyperplasia  -He initially presented with urinary retention and Foley catheter placed on 06/17/2022. -Failed void trial on 06/25/2022. -16 French Foley catheter  placed by urology on 06/25/2022. -Continue tamsulosin 0.8 mg. -Will arrange outpatient follow-up with urology for void trial.  Will likely need BPH evaluation with TRUS for size, cystoscopy, uroflow and ultimately likely bladder obstruction procedure.  Matt R. Olan Kurek MD 06/25/2022, 11:51 AM  [facility]  Pager: [phone]   CC: Eric [place] DO

## 2022-06-25 NOTE — Progress Notes (Signed)
Dr. Abner Greenspan from Urology here to insert [place] catheter with insertion of lidocaine jelly into the urethra prior to insertion for comfort. Pt tolerated the procedure well. CHG already ordered for pt. 1100 cc clear darker yellow urine retrieved. Pt stated he felt a little better after the procedure.

## 2022-06-25 NOTE — Social Work (Signed)
CSW noting documentation indicating medical readiness for inpatient psych. Pt with foley reinserted, this is a barrier to psych placement. Many facilities will not review a referral a second time due to high volumes, will follow for an appropriate time, to maximize ability for placement. Barriers addressed with medical team. TOC will continue to follow.

## 2022-06-25 NOTE — Progress Notes (Signed)
This nurse attempted to in/out cath patient as bladder scan shows 745cc - see flowsheets. Patient agreeable to catheterize as he has tried to void several times throughout the night but was unable to pass urine. This nurse was met with resistance with straight tip cath, unable to get past prostate. Patient started screaming "don't fuckin touch me" This nurse apologized and explained that with bladder being full and a most likely enlarged prostate that the procedure would be uncomfortable. Notified Dr [place] that patient does not want another attempt from nursing staff.

## 2022-06-25 NOTE — Progress Notes (Signed)
Ordered coude catheter and urethral lidocaine jelly for cathing-pt irritated and wants a little time before this is attempted.

## 2022-06-25 NOTE — Progress Notes (Signed)
Patient ID: Paul Bray, male   DOB: 1966/01/05, 57 y.o.   MRN: [id number] Patient is currently in [facility] Observation unit unable to go to [facility] due to urinary outlet obstruction reqiuiring catheter.Mom reports he is currently on 20 mg of Haldol with significant tremors Will schedule to see S/P release/DC

## 2022-06-25 NOTE — Progress Notes (Signed)
PROGRESS NOTE    Paul Bray  [id number] DOB: 04-Jun-1965 DOA: 06/20/2022 PCP: Paul Baptist, PA-C    Brief Narrative:   Paul Bray is a 57 y.o. male with past medical history significant for schizophrenia with recurrent auditory hallucinations, hypothyroidism, anxiety/depression, ADHD, previous polysubstance abuse (IV heroin, cocaine, MDMA), primary malignant neoplasm cerebral ventricle s/p gamma knife radiation 2018 who presented to [facility] ED on 2/24 with complaint of auditory hallucinations and suicidal ideation.  Patient reported that he heard voices in his head more frequently and that day prior to admission these voices instruct him to take extra doses of his home propranolol.  He reportedly took 3 propranolol doses in order to acquiesce to these voices.  EDP discussed case with [facility] and poison control.  Poison control recommended initiation of glucagon drip and medical admission for observation.  [facility] was consulted for admission for suicidal ideation with propranolol overdose.  Assessment & Plan:   Propranolol overdose Patient presenting to the ED with increased auditory hallucinations with suicidal ideations in which he took 3 doses of his propranolol.  Patient was hypotensive with BP 90/72 on admission with heart rate 63.  Patient was given IV fluid bolus and was placed on glucagon drip per recommendations of poison control per discussion with ED physician.  Glucagon drip was titrated off on 06/22/2022 and BP and heart rate has remained stable.  Schizophrenia with auditory hallucinations Suicidal ideation Patient recently admitted to inpatient psychiatry.  Continues with auditory hallucinations and with suicidal ideation with propranolol overdose as above. -- Psychiatry following, appreciate assistance -- Remains under IVC -- Haldol 20 mg p.o. daily -- Continue 1:1 sitter; suicide precautions -- Further per psychiatry, remains medically stable for discharge to  inpatient psych  Hx essential hypertension Patient prescribed propranolol 20 mg p.o. daily as needed for "blood pressure".  Given his attempted overdose with blood pressure remaining stable during hospitalization, 103/69 this morning, will discontinue.  History of polysubstance abuse UDS on admission negative  Hypothyroidism TSH 3.257 on admission, within normal limits. --Levothyroxine 150 mcg p.o. daily  Acute renal failure: Resolved Creatinine 1.4 at admission.  Etiology likely prerenal azotemia in the setting of overdose as above.  Supported with IV fluid hydration with improvement of creatinine to 1.03.  Urinary retention Prostatic hyperplasia Patient had Foley catheter placed on 06/17/2022 due to urinary retention in the ED.  Renal ultrasound 06/17/2022 negative for hydronephrosis.  Case was discussed with urology, Dr. Gloriann Loan by previous hospitalist who recommended voiding trial and Foley catheter was removed on 2/28.  Patient with decreased urinary output and urinary retention noted on bladder scan up to 1 L.  Attempts at Foley catheter placement by RN unsuccessful and urology was consulted and patient underwent coud catheter placement by Dr. Abner Greenspan on 06/25/2022. -- Tamsulosin 0.8 mg p.o. daily -- Finasteride 5 mg p.o. daily -- Outpatient follow-up with urology for void trial, likely will need BPH evaluation with TRUS for size, cystoscopy, uroflow and ultimately likely bladder obstruction procedure  Tobacco use disorder Counseled on need for abstinence/cessation -- Nicotine patch    DVT prophylaxis: enoxaparin (LOVENOX) injection 40 mg Start: 06/21/22 1300 SCDs Start: 06/21/22 [id number]    Code Status: Full Code Family Communication: No family present at bedside this morning  Disposition Plan:  Level of care: Telemetry Medical Status is: Inpatient Remains inpatient appropriate because: Under IVC, medically stable for discharge inpatient psych, further per psychiatry    Consultants:   Psychiatry Urology, Dr. Abner Greenspan  Procedures:  None  Antimicrobials:  None   Subjective: Patient seen examined bedside, lying in bed.  Sitter present.  RN reported continues with urinary retention following Foley catheter removal yesterday.  Attempts at reinsertion of Foley catheter by RN this morning unsuccessful.  Patient belligerent, with expletives and denying any further attempts by staff for replacement.  Consulted nephrology who replaced Foley catheter this afternoon.  Under IVC.  Will need to remain with Foley catheter in place until outpatient follow-up with urology.  No other acute concerns overnight per nursing staff.   Objective: Vitals:   06/24/22 1605 06/24/22 1850 06/24/22 2008 06/25/22 0615  BP: 104/72  100/62 103/69  Pulse: 85  87 82  Resp: '20  18 17  '$ Temp: 99.9 F (37.7 C) 99 F (37.2 C) 99.3 F (37.4 C) 98.3 F (36.8 C)  TempSrc: Oral Oral Oral Oral  SpO2: 97%  94% 95%  Weight:      Height:       No intake or output data in the 24 hours ending 06/25/22 1225 Filed Weights   06/22/22 0614 06/23/22 0500 06/24/22 0500  Weight: 96.3 kg 96.4 kg 94.7 kg    Examination:  Physical Exam: GEN: NAD, alert and oriented x 3, chronically ill appearance, appears older than stated age HEENT: NCAT, PERRL, EOMI, sclera clear, MMM PULM: CTAB w/o wheezes/crackles, normal respiratory effort, on room air  CV: RRR w/o M/G/R GI: abd soft, NTND, + BS MSK: no peripheral edema, moves all extremities independently NEURO: No focal deficits PSYCH: + Auditory hallucinations Integumentary: dry/intact, no rashes or wounds    Data Reviewed: I have personally reviewed following labs and imaging studies  CBC: Recent Labs  Lab 06/20/22 2140 06/21/22 1203 06/22/22 0025  WBC 10.3 7.7 4.6  HGB 15.6 12.2* 11.0*  HCT 44.0 35.6* 31.3*  MCV 90.5 92.7 92.1  PLT 325 207 [id number]   Basic Metabolic Panel: Recent Labs  Lab 06/20/22 2140 06/21/22 1203 06/22/22 0025  NA 136  --  139  K  4.3  --  3.7  CL 99  --  111  CO2 24  --  22  GLUCOSE 160*  --  113*  BUN 18  --  18  CREATININE 1.48* 1.17 1.03  CALCIUM 9.3  --  7.6*   GFR: Estimated Creatinine Clearance: 94.7 mL/min (by C-G formula based on SCr of 1.03 mg/dL). Liver Function Tests: Recent Labs  Lab 06/20/22 2140  AST 27  ALT 27  ALKPHOS 74  BILITOT 0.9  PROT 7.0  ALBUMIN 4.4   No results for input(s): "LIPASE", "AMYLASE" in the last 168 hours. No results for input(s): "AMMONIA" in the last 168 hours. Coagulation Profile: Recent Labs  Lab 06/20/22 2351  INR 1.0   Cardiac Enzymes: No results for input(s): "CKTOTAL", "CKMB", "CKMBINDEX", "TROPONINI" in the last 168 hours. BNP (last 3 results) No results for input(s): "PROBNP" in the last 8760 hours. HbA1C: No results for input(s): "HGBA1C" in the last 72 hours. CBG: No results for input(s): "GLUCAP" in the last 168 hours. Lipid Profile: No results for input(s): "CHOL", "HDL", "LDLCALC", "TRIG", "CHOLHDL", "LDLDIRECT" in the last 72 hours. Thyroid Function Tests: No results for input(s): "TSH", "T4TOTAL", "FREET4", "T3FREE", "THYROIDAB" in the last 72 hours.  Anemia Panel: No results for input(s): "VITAMINB12", "FOLATE", "FERRITIN", "TIBC", "IRON", "RETICCTPCT" in the last 72 hours. Sepsis Labs: Recent Labs  Lab 06/20/22 2351 06/21/22 0311 06/21/22 1713  LATICACIDVEN 2.3* 0.9 0.9    Recent Results (from the past 240  hour(s))  Blood Culture (routine single)     Status: None (Preliminary result)   Collection Time: 06/20/22 11:51 PM   Specimen: BLOOD LEFT HAND  Result Value Ref Range Status   Specimen Description BLOOD LEFT HAND  Final   Special Requests   Final    BOTTLES DRAWN AEROBIC ONLY Blood Culture results may not be optimal due to an inadequate volume of blood received in culture bottles   Culture   Final    NO GROWTH 2 DAYS Performed at [facility], [place] [address] [place], [place] [address]    Report Status PENDING   Incomplete  MRSA Next Gen by PCR, Nasal     Status: None   Collection Time: 06/21/22  6:27 PM   Specimen: Nasal Mucosa; Nasal Swab  Result Value Ref Range Status   MRSA by PCR Next Gen NOT DETECTED NOT DETECTED Final    Comment: (NOTE) The GeneXpert MRSA Assay (FDA approved for NASAL specimens only), is one component of a comprehensive MRSA colonization surveillance program. It is not intended to diagnose MRSA infection nor to guide or monitor treatment for MRSA infections. Test performance is not FDA approved in patients less than 67 years old. Performed at [facility], [place] [address], [place],  [address]          Radiology Studies: No results found.      Scheduled Meds:  buprenorphine  8 mg Sublingual TID   Chlorhexidine Gluconate Cloth  6 each Topical Daily   doxycycline  50 mg Oral Daily   enoxaparin (LOVENOX) injection  40 mg Subcutaneous Q24H   finasteride  5 mg Oral Daily   gabapentin  400 mg Oral BID   haloperidol  20 mg Oral QHS   hydrOXYzine  25 mg Oral BID AC & HS   levothyroxine  150 mcg Oral Daily   modafinil  300 mg Oral Daily   nicotine  21 mg Transdermal Daily   pantoprazole  40 mg Oral Daily   tamsulosin  0.8 mg Oral QHS   Continuous Infusions:  sodium chloride       LOS: 4 days    Time spent: 56 minutes spent on chart review, discussion with nursing staff, consultants, updating family and interview/physical exam; more than 50% of that time was spent in counseling and/or coordination of care.    Lillyanna Glandon J [place] DO [facility] Available via Epic secure chat 7am-7pm After these hours, please refer to coverage provider listed on [email or web address] 06/25/2022, 12:25 PM

## 2022-06-25 NOTE — Consult Note (Signed)
[facility] Consult   Reason for Consult:  Patient with history of schizophrenia and auditory hallucination presented with propranolol overdosed. He ishostile during exam  Referring Physician:  Dr. [place] Patient Identification: Paul Bray MRN:  [id number] Principal Diagnosis: Propranolol overdose Diagnosis:  Principal Problem:   Propranolol overdose Active Problems:   Acquired hypothyroidism   Paranoid schizophrenia ([place])   Undifferentiated schizophrenia ([place])   Stimulant use disorder   GAD (generalized anxiety disorder)   Encounter for monitoring Suboxone maintenance therapy   Uncomplicated opioid dependence ([place])   Opioid use disorder, moderate, on maintenance therapy (HCC)   Cigarette nicotine dependence without complication   AKI (acute kidney injury) ([place])   Lactic acidemia   Obesity (BMI 30-39.9)   Auditory hallucination   Anxiety   Attention deficit hyperactivity disorder (ADHD)   Drug overdose   Hypotension   Adverse reaction to beta-blocker   Total Time spent with patient: 15 minutes  Subjective:   Paul Bray is a 57 y.o. male seen and evaluated face-to-face by this provider.   Patient seen, chart reviewed and case discussed with Dr. Purvis Sheffield for this face-to-face psychiatric consultation and evaluation of behavior and agitation. Patient appeared lying on his bed, somewhat agitated during my evaluation. Patient is easily woken up with verbal stimuli and able to participate. He is very irritable and states " I just had a foley placed how do you think I am doing. " There was some signs of psychomotor agitation but manageable no threats detected.  He denies any behavioral disturbances and or agitation issues while in the hospital. Chart review seems to be consistent with his reports.  He adamantly denies being suicidal and or threatening anyone, even though he is very irritable, labile, and verbally aggressive at times.  Patient is loud and  boisterous, which appears to be his baseline.  He denies any homicidal ideations, violent tendencies, aggressive, and or combative behavior at his during the hospitalization.  He denies any current and or pending legal charges and or substance use.     HPI:" Paul Bray is a 57 y.o. male admitted medically for 06/20/2022  9:20 PM for an intentional beta-blocker overdose (unclear if suicidal intent - reportedly took for high BP and anxiety). 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 Prior hx polysubstance abuse (cocaine, heroin, MDMA)."   Past Psychiatric History: Generalized anxiety disorder, polysubstance abuse, schizophrenia with auditory hallucinations.  Risk to Self:   Risk to Others:   Prior Inpatient Therapy:   Prior Outpatient Therapy:    Past Medical History:  Past Medical History:  Diagnosis Date   ADHD    Anxiety    Brain tumor ([place])    balance and occ memory issues and headaches   Brain tumor ([place])    sees [facility] q year   Depression    Headache    migraine and tension   Hypothyroidism    Soft tissue mass    left shoulder   Substance abuse ([place])     Past Surgical History:  Procedure Laterality Date   colonscopy     gamma knife radiation 2018 for brain tumor'     hematoma removed from arm Left    MASS EXCISION Left 06/30/2019   Procedure: EXCISION OF SOFT TISSUE  MASS LEFT SHOULDER;  Surgeon: Armandina Gemma, MD;  Location: [facility];  Service:  General;  Laterality: Left;  LMA   Family History: History reviewed. No pertinent family history. Family Psychiatric  History:  Social History:  Social History   Substance and Sexual Activity  Alcohol Use Not Currently   Comment: quit Oct 2019     Social History   Substance and Sexual Activity  Drug Use Not Currently   Types: Cocaine, IV, Heroin, MDMA (Ecstacy)    Comment: last reported use; cocaine and heroin in 2019; MDMA Feb 2023    Social History   Socioeconomic History   Marital status: Single    Spouse name: Not on file   Number of children: Not on file   Years of education: Not on file   Highest education level: Not on file  Occupational History   Not on file  Tobacco Use   Smoking status: Former    Types: Cigarettes   Smokeless tobacco: Current   Tobacco comments:    quit jan 2020  Vaping Use   Vaping Use: Every day  Substance and Sexual Activity   Alcohol use: Not Currently    Comment: quit Oct 2019   Drug use: Not Currently    Types: Cocaine, IV, Heroin, MDMA (Ecstacy)    Comment: last reported use; cocaine and heroin in 2019; MDMA Feb 2023   Sexual activity: Not Currently  Other Topics Concern   Not on file  Social History Narrative   Not on file   Social Determinants of Health   Financial Resource Strain: Not on file  Food Insecurity: Not on file  Transportation Needs: Not on file  Physical Activity: Not on file  Stress: Not on file  Social Connections: Not on file   Additional Social History:    Allergies:   Allergies  Allergen Reactions   Ingrezza [Valbenazine Tosylate] Other (See Comments)    Severe tongue chewing at 80 mg Tolerates lower doses   Naloxone Palpitations    Pt report MAT Rx is Subutex/Buprenorphine only   Amphetamine-Dextroamphetamine Other (See Comments)    unknown    Labs:  No results found for this or any previous visit (from the past 48 hour(s)).   Current Facility-Administered Medications  Medication Dose Route Frequency Provider Last Rate Last Admin   acetaminophen (TYLENOL) tablet 650 mg  650 mg Oral Q6H PRN Nicoletta Dress, Na, MD   650 mg at 06/24/22 1643   Or   acetaminophen (TYLENOL) suppository 650 mg  650 mg Rectal Q6H PRN Nicoletta Dress, Na, MD       buprenorphine (SUBUTEX) sublingual tablet 8 mg  8 mg Sublingual TID Nicoletta Dress, Na, MD   8 mg at 06/25/22 1008   Chlorhexidine Gluconate Cloth 2 %  PADS 6 each  6 each Topical Daily Nicoletta Dress, Na, MD   6 each at 06/25/22 1351   diphenhydrAMINE (BENADRYL) capsule 25 mg  25 mg Oral Q8H PRN Nicoletta Dress, Na, MD       doxycycline (VIBRAMYCIN) 50 MG capsule 50 mg  50 mg Oral Daily Hall, Carole N, DO   50 mg at 06/25/22 1007   enoxaparin (LOVENOX) injection 40 mg  40 mg Subcutaneous Q24H Li, Na, MD   40 mg at 06/25/22 1513   finasteride (PROSCAR) tablet 5 mg  5 mg Oral Daily [place] Donnamarie Poag, DO   5 mg at 06/25/22 1008   gabapentin (NEURONTIN) capsule 400 mg  400 mg Oral BID Nicoletta Dress, Na, MD   400 mg at 06/25/22 1009   haloperidol (HALDOL) tablet 20 mg  20 mg Oral  QHS [name], NP   20 mg at 06/24/22 2113   hydrOXYzine (ATARAX) tablet 25 mg  25 mg Oral BID AC & HS Nicoletta Dress, Na, MD   25 mg at 06/24/22 2112   levothyroxine (SYNTHROID) tablet 150 mcg  150 mcg Oral Daily Nicoletta Dress, Na, MD   150 mcg at 06/25/22 [id number]   modafinil (PROVIGIL) tablet 300 mg  300 mg Oral Daily Cinderella, Margaret A   300 mg at 06/25/22 1008   nicotine (NICODERM CQ - dosed in mg/24 hours) patch 21 mg  21 mg Transdermal Daily Nicoletta Dress, Na, MD   21 mg at 06/25/22 1008   pantoprazole (PROTONIX) EC tablet 40 mg  40 mg Oral Daily Li, Na, MD   40 mg at 06/25/22 1007   promethazine (PHENERGAN) tablet 12.5 mg  12.5 mg Oral Q6H PRN Nicoletta Dress, Na, MD       sodium chloride 0.9 % bolus 1,000 mL  1,000 mL Intravenous Once Nicoletta Dress, Na, MD       tamsulosin (FLOMAX) capsule 0.8 mg  0.8 mg Oral QHS Donne Hazel, MD   0.8 mg at 06/24/22 2112    Musculoskeletal:   Psychiatric Specialty Exam:  Presentation  General Appearance:  Fairly Groomed  Eye Contact: Good  Speech: Clear and Coherent  Speech Volume: Normal  Handedness: Right   Mood and Affect  Mood: Irritable  Affect: Appropriate; Congruent   Thought Process  Thought Processes: Coherent; Linear  Descriptions of Associations:Intact  Orientation:Full (Time, Place and Person)  Thought Content:WDL  History of Schizophrenia/Schizoaffective  disorder:Yes  Duration of Psychotic Symptoms:Greater than six months  Hallucinations:Hallucinations: None Description of Auditory Hallucinations: DEnies today  Ideas of Reference:None  Suicidal Thoughts:Suicidal Thoughts: No SI Passive Intent and/or Plan: Without Intent; Without Means to Carry Out; Without Access to Means  Homicidal Thoughts:Homicidal Thoughts: No   Sensorium  Memory: Immediate Fair; Recent Fair  Judgment: Fair  Insight: Fair   Community education officer  Concentration: Fair  Attention Span: Fair  Recall: Good  Fund of Knowledge: Good  Language: Good   Psychomotor Activity  Psychomotor Activity: Psychomotor Activity: Restlessness   Assets  Assets: Desire for Improvement; Physical Health; Resilience   Sleep  Sleep: Sleep: Fair   Physical Exam: Physical Exam ROS Blood pressure 103/69, pulse 82, temperature 98.3 F (36.8 C), temperature source Oral, resp. rate 17, height '5\' 10"'$  (1.778 m), weight 94.7 kg, SpO2 95 %. Body mass index is 29.96 kg/m.  Treatment Plan Summary: Daily contact with patient to assess and evaluate symptoms and progress in treatment and Medication management  Disposition: Recommend psychiatric Inpatient admission when medically cleared.  -Continues to be recommended for inpatient admission once medically cleared due to possible suicide attempt with propranolol overdose.  - Will reduce haldol '10mg'$  po daily to evaluate improvement of urinary retention/dysfunction.   -There are additional options available outside of Haldol, will continue to monitor.   -Psychiatry to continue to follow  Suella Broad, FNP 06/25/2022 3:48 PM

## 2022-06-26 LAB — CULTURE, BLOOD (SINGLE): Culture: NO GROWTH

## 2022-06-26 NOTE — Plan of Care (Signed)
  Problem: Education: Goal: Knowledge of General Education information will improve Description: Including pain rating scale, medication(s)/side effects and non-pharmacologic comfort measures Outcome: Progressing   Problem: Health Behavior/Discharge Planning: Goal: Ability to manage health-related needs will improve Outcome: Progressing   Problem: Clinical Measurements: Goal: Ability to maintain clinical measurements within normal limits will improve Outcome: Progressing Goal: Will remain free from infection Outcome: Progressing Goal: Respiratory complications will improve Outcome: Progressing Goal: Cardiovascular complication will be avoided Outcome: Progressing   Problem: Activity: Goal: Risk for activity intolerance will decrease Outcome: Progressing   Problem: Nutrition: Goal: Adequate nutrition will be maintained Outcome: Progressing   Problem: Coping: Goal: Level of anxiety will decrease Outcome: Progressing   Problem: Elimination: Goal: Will not experience complications related to bowel motility Outcome: Progressing Goal: Will not experience complications related to urinary retention Outcome: Progressing

## 2022-06-26 NOTE — Progress Notes (Signed)
PROGRESS NOTE    Paul Bray  [id number] DOB: 1965-05-23 DOA: 06/20/2022 PCP: Jola Baptist, PA-C    Brief Narrative:   Paul Bray is a 57 y.o. 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  Patient reported that he heard voices in his head more frequently and that day prior to admission these voices instruct him to take extra doses of his home propranolol.  He reportedly took 3 propranolol doses in order to acquiesce to these voices.  EDP discussed case with [facility] and poison control.  Poison control recommended initiation of glucagon drip and medical admission for observation.  TRH was consulted for admission for suicidal ideation with propranolol overdose.  Assessment & Plan:   Propranolol overdose Patient presenting to the ED with increased auditory hallucinations with suicidal ideations in which he took 3 doses of his propranolol.  Patient was hypotensive with BP 90/72 on admission with heart rate 63.  Patient was given IV fluid bolus and was placed on glucagon drip per recommendations of poison control per discussion with ED physician.  Glucagon drip was titrated off on 06/22/2022 and BP and heart rate has remained stable.  Schizophrenia with auditory hallucinations Suicidal ideation Patient recently admitted to inpatient psychiatry.  Continues with auditory hallucinations and with suicidal ideation with propranolol overdose as above. -- Psychiatry following, appreciate assistance -- Remains under IVC -- Haldol 00 mg p.o. daily -- Continue 1:1 sitter; suicide precautions -- Further per psychiatry, remains medically stable for discharge to  inpatient psych  Hx essential hypertension Patient prescribed propranolol 20 mg p.o. daily as needed for "blood pressure".  Given his attempted overdose with blood pressure remaining stable during hospitalization, 103/69 this morning, will discontinue.  History of polysubstance abuse UDS on admission negative  Hypothyroidism TSH 3.257 on admission, within normal limits. --Levothyroxine 150 mcg p.o. daily  Acute renal failure: Resolved Creatinine 1.4 at admission.  Etiology likely prerenal azotemia in the setting of overdose as above.  Supported with IV fluid hydration with improvement of creatinine to 1.03.  Acute urinary retention, recurrent Prostatic hyperplasia Patient had Foley catheter placed on 06/17/2022 due to urinary retention in the ED.  Renal ultrasound 06/17/2022 negative for hydronephrosis.  Case was discussed with urology, Dr. Gloriann Loan by previous hospitalist who recommended voiding trial and Foley catheter was removed on 2/28.  Patient with decreased urinary output and urinary retention noted on bladder scan up to 1 L.  Attempts at Foley catheter placement by RN unsuccessful and urology was consulted and patient underwent coud catheter placement by Dr. Abner Greenspan on 06/25/2022. -- Tamsulosin 0.8 mg p.o. daily -- Finasteride 5 mg p.o. daily -- Outpatient follow-up with urology for void trial, likely will need BPH evaluation with TRUS for size, cystoscopy, uroflow and ultimately likely bladder obstruction procedure  Tobacco use disorder Counseled on need for abstinence/cessation -- Nicotine patch    DVT prophylaxis: enoxaparin (LOVENOX) injection 40 mg Start: 06/21/22 1300 SCDs Start: 06/21/22 [id number]    Code Status: Full Code Family Communication: No family present at bedside this morning  Disposition Plan:  Level of care: Med-Surg Status is: Inpatient Remains inpatient appropriate because: Under IVC, medically stable for discharge inpatient psych, further per psychiatry     Consultants:  Psychiatry Urology, Dr. Abner Greenspan  Procedures:  Foley catheter replacement 06/25/2022  Antimicrobials:  None   Subjective: Patient seen examined bedside, lying in bed.  Sitter present. Under IVC.  Foley replaced yesterday for recurrent retention with plans to continue until outpatient follow-up with urology.  Medically stable for discharge once inpatient psych bed obtained.  No other acute concerns overnight per nursing staff.   Objective: Vitals:   06/24/22 2008 06/25/22 0615 06/25/22 2141 06/26/22 0711  BP: 100/62 103/69 (!) 132/94 108/72  Pulse: 87 82 80 64  Resp: '18 17 19 17  '$ Temp: 99.3 F (37.4 C) 98.3 F (36.8 C) 98.9 F (37.2 C) 97.8 F (36.6 C)  TempSrc: Oral Oral Oral Oral  SpO2: 94% 95% 96% 98%  Weight:      Height:        Intake/Output Summary (Last 24 hours) at 06/26/2022 1105 Last data filed at 06/26/2022 1100 Gross per 24 hour  Intake --  Output 2350 ml  Net -2350 ml   Filed Weights   06/22/22 0614 06/23/22 0500 06/24/22 0500  Weight: 96.3 kg 96.4 kg 94.7 kg    Examination:  Physical Exam: GEN: NAD, alert and oriented x 3, chronically ill appearance, appears older than stated age HEENT: NCAT, PERRL, EOMI, sclera clear, MMM PULM: CTAB w/o wheezes/crackles, normal respiratory effort, on room air  CV: RRR w/o M/G/R GI: abd soft, NTND, + BS GU: Foley catheter noted in place draining clear yellow urine in collection bag MSK: no peripheral edema, moves all extremities independently NEURO: No focal deficits PSYCH: Denies auditory/visual hallucinations, denies SI/HI Integumentary: dry/intact, no rashes or wounds    Data Reviewed: I have personally reviewed following labs and imaging studies  CBC: Recent Labs  Lab 06/20/22 2140 06/21/22 1203 06/22/22 0025  WBC 10.3 7.7 4.6  HGB 15.6 12.2* 11.0*  HCT 44.0 35.6* 31.3*  MCV 90.5 92.7 92.1  PLT 325 207 [id number]   Basic Metabolic Panel: Recent Labs  Lab 06/20/22 2140 06/21/22 1203  06/22/22 0025  NA 136  --  139  K 4.3  --  3.7  CL 99  --  111  CO2 24  --  22  GLUCOSE 160*  --  113*  BUN 18  --  18  CREATININE 1.48* 1.17 1.03  CALCIUM 9.3  --  7.6*   GFR: Estimated Creatinine Clearance: 94.7 mL/min (by C-G formula based on SCr of 1.03 mg/dL). Liver Function Tests: Recent Labs  Lab 06/20/22 2140  AST 27  ALT 27  ALKPHOS 74  BILITOT 0.9  PROT 7.0  ALBUMIN 4.4   No results for input(s): "LIPASE", "AMYLASE" in the last 168 hours. No results for input(s): "AMMONIA" in the last 168 hours. Coagulation Profile: Recent Labs  Lab 06/20/22 2351  INR 1.0   Cardiac Enzymes: No results for input(s): "CKTOTAL", "CKMB", "CKMBINDEX", "TROPONINI" in the last 168 hours. BNP (last 3 results) No results for input(s): "PROBNP" in the last 8760 hours. HbA1C: No results for input(s): "HGBA1C" in the last 72 hours. CBG: No results for input(s): "GLUCAP" in the last 168 hours. Lipid Profile: No results for input(s): "CHOL", "HDL", "LDLCALC", "TRIG", "CHOLHDL", "LDLDIRECT" in the last 72 hours. Thyroid Function Tests: No results for input(s): "TSH", "T4TOTAL", "FREET4", "T3FREE", "THYROIDAB" in the last 72 hours.  Anemia Panel: No results for input(s): "VITAMINB12", "FOLATE", "FERRITIN", "TIBC", "IRON", "RETICCTPCT" in the last 72 hours. Sepsis Labs: Recent Labs  Lab 06/20/22 2351 06/21/22 0311 06/21/22 1713  LATICACIDVEN 2.3* 0.9 0.9    Recent Results (  from the past 240 hour(s))  Blood Culture (routine single)     Status: None   Collection Time: 06/20/22 11:51 PM   Specimen: BLOOD LEFT HAND  Result Value Ref Range Status   Specimen Description BLOOD LEFT HAND  Final   Special Requests   Final    BOTTLES DRAWN AEROBIC ONLY Blood Culture results may not be optimal due to an inadequate volume of blood received in culture bottles   Culture   Final    NO GROWTH 5 DAYS Performed at [facility], [place] [address], [place], [place] [address]    Report  Status 06/26/2022 FINAL  Final  MRSA Next Gen by PCR, Nasal     Status: None   Collection Time: 06/21/22  6:27 PM   Specimen: Nasal Mucosa; Nasal Swab  Result Value Ref Range Status   MRSA by PCR Next Gen NOT DETECTED NOT DETECTED Final    Comment: (NOTE) The GeneXpert MRSA Assay (FDA approved for NASAL specimens only), is one component of a comprehensive MRSA colonization surveillance program. It is not intended to diagnose MRSA infection nor to guide or monitor treatment for MRSA infections. Test performance is not FDA approved in patients less than 52 years old. Performed at [facility], [place] [address], [place], [place] [address]          Radiology Studies: No results found.      Scheduled Meds:  buprenorphine  8 mg Sublingual TID   Chlorhexidine Gluconate Cloth  6 each Topical Daily   doxycycline  50 mg Oral Daily   enoxaparin (LOVENOX) injection  40 mg Subcutaneous Q24H   finasteride  5 mg Oral Daily   gabapentin  400 mg Oral BID   haloperidol  10 mg Oral QHS   hydrOXYzine  25 mg Oral BID AC & HS   levothyroxine  150 mcg Oral Daily   modafinil  300 mg Oral Daily   nicotine  21 mg Transdermal Daily   pantoprazole  40 mg Oral Daily   tamsulosin  0.8 mg Oral QHS   Continuous Infusions:  sodium chloride       LOS: 5 days    Time spent: 48 minutes spent on chart review, discussion with nursing staff, consultants, updating family and interview/physical exam; more than 50% of that time was spent in counseling and/or coordination of care.    Shloimy Michalski J [place] DO [facility] Available via Epic secure chat 7am-7pm After these hours, please refer to coverage provider listed on [email or web address] 06/26/2022, 11:05 AM

## 2022-06-26 NOTE — TOC Progression Note (Signed)
Transition of Care [facility]) - Progression Note    Patient Details  Name: Paul Bray MRN: [id number] Date of Birth: 11-19-1965  Transition of [facility]) CM/SW [place], [place] Phone Number: 06/26/2022, 11:37 AM  Clinical Narrative:    Pt remains under IVC with the recommendation being inpatient psych placement. Pt currently has a foley catheter, being recommended for follow up with OP Urology. This is currently a barrier to pursuing Inpatient Psych placement, Medical team aware. Pt's IVC to be renewed on 3/3 if applicable. Paperwork completed and on chart, will need to be filed on 3/3. MD notified of this and will be available to complete paperwork if needed. TOC will continue to follow for DC needs.         Expected Discharge Plan and Services                                               Social Determinants of Health (SDOH) Interventions SDOH Screenings   Alcohol Screen: Low Risk  (10/02/2021)  Tobacco Use: High Risk (06/25/2022)    Readmission Risk Interventions     No data to display

## 2022-06-27 MED ORDER — SENNOSIDES-DOCUSATE SODIUM 8.6-50 MG PO TABS
1.0000 | ORAL_TABLET | Freq: Two times a day (BID) | ORAL | Status: DC
  Administered 2022-06-27 – 2022-06-29 (×5): 1 via ORAL
  Filled 2022-06-27 (×5): qty 1

## 2022-06-27 MED ORDER — POLYETHYLENE GLYCOL 3350 17 G PO PACK
17.0000 g | PACK | Freq: Once | ORAL | Status: AC
  Administered 2022-06-27: 17 g via ORAL
  Filled 2022-06-27: qty 1

## 2022-06-27 NOTE — Progress Notes (Signed)
PROGRESS NOTE    Paul Bray  [id number] DOB: 10-19-1965 DOA: 06/20/2022 PCP: Jola Baptist, PA-C    Brief Narrative:   Paul Bray is a 57 y.o. 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  Patient reported that he heard voices in his head more frequently and that day prior to admission these voices instruct him to take extra doses of his home propranolol.  He reportedly took 3 propranolol doses in order to acquiesce to these voices.  EDP discussed case with [facility] and poison control.  Poison control recommended initiation of glucagon drip and medical admission for observation.  TR[facility] was consulted for admission for suicidal ideation with propranolol overdose.  Assessment & Plan:   Propranolol overdose Patient presenting to the ED with increased auditory hallucinations with suicidal ideations in which he took 3 doses of his propranolol.  Patient was hypotensive with BP 90/72 on admission with heart rate 63.  Patient was given IV fluid bolus and was placed on glucagon drip per recommendations of poison control per discussion with ED physician.  Glucagon drip was titrated off on 06/22/2022 and BP and heart rate has remained stable.  Schizophrenia with auditory hallucinations Suicidal ideation Patient recently admitted to inpatient psychiatry.  Continues with auditory hallucinations and with suicidal ideation with propranolol overdose as above. -- Psychiatry following, appreciate assistance -- Remains under IVC -- Haldol 00 mg p.o. daily -- Continue 1:1 sitter; suicide precautions -- Further per psychiatry, remains medically stable for discharge to  inpatient psych  Hx essential hypertension Patient prescribed propranolol 20 mg p.o. daily as needed for "blood pressure".  Given his attempted overdose with blood pressure remaining stable during hospitalization, 103/69 this morning, will discontinue.  History of polysubstance abuse UDS on admission negative  Hypothyroidism TSH 3.257 on admission, within normal limits. --Levothyroxine 150 mcg p.o. daily  Acute renal failure: Resolved Creatinine 1.4 at admission.  Etiology likely prerenal azotemia in the setting of overdose as above.  Supported with IV fluid hydration with improvement of creatinine to 1.03.  Acute urinary retention, recurrent Prostatic hyperplasia Patient had Foley catheter placed on 06/17/2022 due to urinary retention in the ED.  Renal ultrasound 06/17/2022 negative for hydronephrosis.  Case was discussed with urology, Dr. Gloriann Loan by previous hospitalist who recommended voiding trial and Foley catheter was removed on 2/28.  Patient with decreased urinary output and urinary retention noted on bladder scan up to 1 L.  Attempts at Foley catheter placement by RN unsuccessful and urology was consulted and patient underwent coud catheter placement by Dr. Abner Greenspan on 06/25/2022. -- Tamsulosin 0.8 mg p.o. daily -- Finasteride 5 mg p.o. daily -- Outpatient follow-up with urology for void trial, likely will need BPH evaluation with TRUS for size, cystoscopy, uroflow and ultimately likely bladder obstruction procedure  Tobacco use disorder Counseled on need for abstinence/cessation -- Nicotine patch    DVT prophylaxis: enoxaparin (LOVENOX) injection 40 mg Start: 06/21/22 1300 SCDs Start: 06/21/22 [id number]    Code Status: Full Code Family Communication: No family present at bedside this morning  Disposition Plan:  Level of care: Med-Surg Status is: Inpatient Remains inpatient appropriate because: Under IVC, medically stable for discharge inpatient psych, further per psychiatry     Consultants:  Psychiatry Urology, Dr. Abner Greenspan  Procedures:  Foley catheter replacement 06/25/2022  Antimicrobials:  None   Subjective: Patient seen examined bedside, lying in bed.  Sitter present. Under IVC.  Continues with auditory hallucinations but unable to verbalize what he is being told, also continues with "fleeting" suicidal ideations with undetermined intent.  Continues with Foley catheter in place.  Reports has not had a bowel movement for a few days.  No other complaints or concerns at this time.  Denies headache, no chest pain, no shortness of breath, no abdominal pain.  Medically stable for discharge once inpatient psych bed obtained.  No acute concerns overnight per nursing staff.   Objective: Vitals:   06/25/22 2141 06/26/22 0711 06/26/22 1910 06/26/22 2345  BP: (!) 132/94 108/72 118/81 132/87  Pulse: 80 64 79 78  Resp: '19 17 18 18  '$ Temp: 98.9 F (37.2 C) 97.8 F (36.6 C) 98.3 F (36.8 C) 98 F (36.7 C)  TempSrc: Oral Oral Oral Oral  SpO2: 96% 98% 98% 98%  Weight:      Height:        Intake/Output Summary (Last 24 hours) at 06/27/2022 [id number] Last data filed at 06/26/2022 1911 Gross per 24 hour  Intake 240 ml  Output 1150 ml  Net -910 ml   Filed Weights   06/22/22 0614 06/23/22 0500 06/24/22 0500  Weight: 96.3 kg 96.4 kg 94.7 kg    Examination:  Physical Exam: GEN: NAD, alert and oriented x 3, chronically ill appearance, appears older than stated age HEENT: NCAT, PERRL, EOMI, sclera clear, MMM PULM: CTAB w/o wheezes/crackles, normal respiratory effort, on room air  CV: RRR w/o M/G/R GI: abd soft, NTND, + BS GU: Foley catheter noted in place draining clear yellow urine in collection bag MSK: no peripheral edema, moves all extremities independently NEURO: No focal deficits PSYCH: Denies visual hallucination but continues with intermittent auditory hallucinations, continues with + SI, denies HI Integumentary: dry/intact, no rashes or wounds    Data  Reviewed: I have personally reviewed following labs and imaging studies  CBC: Recent Labs  Lab 06/20/22 2140 06/21/22 1203 06/22/22 0025  WBC 10.3 7.7 4.6  HGB 15.6 12.2* 11.0*  HCT 44.0 35.6* 31.3*  MCV 90.5 92.7 92.1  PLT 325 207 [id number]   Basic Metabolic Panel: Recent Labs  Lab 06/20/22 2140 06/21/22 1203 06/22/22 0025  NA 136  --  139  K 4.3  --  3.7  CL 99  --  111  CO2 24  --  22  GLUCOSE 160*  --  113*  BUN 18  --  18  CREATININE 1.48* 1.17 1.03  CALCIUM 9.3  --  7.6*   GFR: Estimated Creatinine Clearance: 94.7 mL/min (by C-G formula based on SCr of 1.03 mg/dL). Liver Function Tests: Recent Labs  Lab 06/20/22 2140  AST 27  ALT 27  ALKPHOS 74  BILITOT 0.9  PROT 7.0  ALBUMIN 4.4   No results for input(s): "LIPASE", "AMYLASE" in the last 168 hours. No results for input(s): "AMMONIA" in the last 168 hours. Coagulation Profile: Recent Labs  Lab 06/20/22 2351  INR 1.0   Cardiac Enzymes: No results for input(s): "CKTOTAL", "CKMB", "CKMBINDEX", "TROPONINI" in the last 168 hours. BNP (last 3 results) No results for input(s): "PROBNP" in the last 8760 hours. HbA1C: No results for input(s): "HGBA1C" in the last 72 hours. CBG: No results for input(s): "GLUCAP" in the last 168 hours. Lipid Profile: No results for input(s): "CHOL", "HDL", "LDLCALC", "TRIG", "CHOLHDL", "LDLDIRECT" in the last 72  hours. Thyroid Function Tests: No results for input(s): "TSH", "T4TOTAL", "FREET4", "T3FREE", "THYROIDAB" in the last 72 hours.  Anemia Panel: No results for input(s): "VITAMINB12", "FOLATE", "FERRITIN", "TIBC", "IRON", "RETICCTPCT" in the last 72 hours. Sepsis Labs: Recent Labs  Lab 06/20/22 2351 06/21/22 0311 06/21/22 1713  LATICACIDVEN 2.3* 0.9 0.9    Recent Results (from the past 240 hour(s))  Blood Culture (routine single)     Status: None   Collection Time: 06/20/22 11:51 PM   Specimen: BLOOD LEFT HAND  Result Value Ref Range Status   Specimen  Description BLOOD LEFT HAND  Final   Special Requests   Final    BOTTLES DRAWN AEROBIC ONLY Blood Culture results may not be optimal due to an inadequate volume of blood received in culture bottles   Culture   Final    NO GROWTH 5 DAYS Performed at [facility], [place] [address], [place], [place] [address]    Report Status 06/26/2022 FINAL  Final  MRSA Next Gen by PCR, Nasal     Status: None   Collection Time: 06/21/22  6:27 PM   Specimen: Nasal Mucosa; Nasal Swab  Result Value Ref Range Status   MRSA by PCR Next Gen NOT DETECTED NOT DETECTED Final    Comment: (NOTE) The GeneXpert MRSA Assay (FDA approved for NASAL specimens only), is one component of a comprehensive MRSA colonization surveillance program. It is not intended to diagnose MRSA infection nor to guide or monitor treatment for MRSA infections. Test performance is not FDA approved in patients less than 82 years old. Performed at [facility], [place] [address] [place], [place] [address]          Radiology Studies: No results found.      Scheduled Meds:  buprenorphine  8 mg Sublingual TID   Chlorhexidine Gluconate Cloth  6 each Topical Daily   doxycycline  50 mg Oral Daily   enoxaparin (LOVENOX) injection  40 mg Subcutaneous Q24H   finasteride  5 mg Oral Daily   gabapentin  400 mg Oral BID   haloperidol  10 mg Oral QHS   hydrOXYzine  25 mg Oral BID AC & HS   levothyroxine  150 mcg Oral Daily   modafinil  300 mg Oral Daily   nicotine  21 mg Transdermal Daily   pantoprazole  40 mg Oral Daily   polyethylene glycol  17 g Oral Once   senna-docusate  1 tablet Oral BID   tamsulosin  0.8 mg Oral QHS   Continuous Infusions:  sodium chloride       LOS: 6 days    Time spent: 48 minutes spent on chart review, discussion with nursing staff, consultants, updating family and interview/physical exam; more than 50% of that time was spent in counseling and/or coordination of care.    Ida Uppal J [place]  DO [facility] Available via Epic secure chat 7am-7pm After these hours, please refer to coverage provider listed on [email or web address] 06/27/2022, 9:59 AM

## 2022-06-28 LAB — CBC WITH DIFFERENTIAL/PLATELET
Abs Immature Granulocytes: 0.01 10*3/uL (ref 0.00–0.07)
Basophils Absolute: 0 10*3/uL (ref 0.0–0.1)
Basophils Relative: 0 %
Eosinophils Absolute: 0 10*3/uL (ref 0.0–0.5)
Eosinophils Relative: 1 %
HCT: 37.7 % — ABNORMAL LOW (ref 39.0–52.0)
Hemoglobin: 12.8 g/dL — ABNORMAL LOW (ref 13.0–17.0)
Immature Granulocytes: 0 %
Lymphocytes Relative: 41 %
Lymphs Abs: 1.5 10*3/uL (ref 0.7–4.0)
MCH: 31.8 pg (ref 26.0–34.0)
MCHC: 34 g/dL (ref 30.0–36.0)
MCV: 93.5 fL (ref 80.0–100.0)
Monocytes Absolute: 0.3 10*3/uL (ref 0.1–1.0)
Monocytes Relative: 9 %
Neutro Abs: 1.7 10*3/uL (ref 1.7–7.7)
Neutrophils Relative %: 49 %
Platelets: 177 10*3/uL (ref 150–400)
RBC: 4.03 MIL/uL — ABNORMAL LOW (ref 4.22–5.81)
RDW: 13.3 % (ref 11.5–15.5)
WBC: 3.6 10*3/uL — ABNORMAL LOW (ref 4.0–10.5)
nRBC: 0 % (ref 0.0–0.2)

## 2022-06-28 LAB — COMPREHENSIVE METABOLIC PANEL
ALT: 83 U/L — ABNORMAL HIGH (ref 0–44)
AST: 59 U/L — ABNORMAL HIGH (ref 15–41)
Albumin: 3.4 g/dL — ABNORMAL LOW (ref 3.5–5.0)
Alkaline Phosphatase: 82 U/L (ref 38–126)
Anion gap: 10 (ref 5–15)
BUN: 14 mg/dL (ref 6–20)
CO2: 24 mmol/L (ref 22–32)
Calcium: 8.7 mg/dL — ABNORMAL LOW (ref 8.9–10.3)
Chloride: 104 mmol/L (ref 98–111)
Creatinine, Ser: 0.99 mg/dL (ref 0.61–1.24)
GFR, Estimated: >60 mL/min (ref >60)
Glucose, Bld: 109 mg/dL — ABNORMAL HIGH (ref 70–99)
Potassium: 4.4 mmol/L (ref 3.5–5.1)
Sodium: 138 mmol/L (ref 135–145)
Total Bilirubin: 0.4 mg/dL (ref 0.3–1.2)
Total Protein: 5.8 g/dL — ABNORMAL LOW (ref 6.5–8.1)

## 2022-06-28 MED ORDER — DIPHENHYDRAMINE HCL 25 MG PO CAPS
25.0000 mg | ORAL_CAPSULE | ORAL | Status: DC | PRN
  Administered 2022-06-28 (×3): 25 mg via ORAL
  Filled 2022-06-28 (×3): qty 1

## 2022-06-28 NOTE — Consult Note (Signed)
[facility] Consult   Reason for Consult:  Psychosis Referring Physician:  Dr. [place] Patient Identification: Paul Bray MRN:  [id number] Principal Diagnosis: Propranolol overdose Diagnosis:  Principal Problem:   Propranolol overdose Active Problems:   Acquired hypothyroidism   Paranoid schizophrenia ([place])   Undifferentiated schizophrenia ([place])   Stimulant use disorder   GAD (generalized anxiety disorder)   Encounter for monitoring Suboxone maintenance therapy   Uncomplicated opioid dependence ([place])   Opioid use disorder, moderate, on maintenance therapy ([facility])   Cigarette nicotine dependence without complication   AKI (acute kidney injury) ([place])   Lactic acidemia   Obesity (BMI 30-39.9)   Auditory hallucination   Anxiety   Attention deficit hyperactivity disorder (ADHD)   Drug overdose   Hypotension   Adverse reaction to beta-blocker   Total Time spent with patient: 30 minutes  Subjective:   Paul Bray is a 57 y.o. male patient admitted with Propranolol overdose and acute urinary retention.  HPI:   Patient seen sitting up in bed this afternoon on my approach accompanied by his mother and nursing staff. He reports that prior to coming to the hospital he was experiencing auditory hallucinations and having concerns that there was someone trying to kill his mother.  Since being in the hospital he has been doing well but he continues to experience auditory hallucinations. He denies any paranoid thoughts that anyone wants to harm him and states that he feels safe in the hospital.  Patient's mother at bedside reports that he has been compliant with his home dose of Haldol but she is concerned that with him still having hallucinations he may need a higher dosage.  Patient denies any SI/HI/VH.  Past Psychiatric History: Schizophrenia and Generalized Anxiety Disorder  Risk to Self:   No Risk to Others:   No Prior Inpatient Therapy:   Yes Prior  Outpatient Therapy:   Yes  Past Medical History:  Past Medical History:  Diagnosis Date   ADHD    Anxiety    Brain tumor ([place])    balance and occ memory issues and headaches   Brain tumor ([place])    sees [facility] q year   Depression    Headache    migraine and tension   Hypothyroidism    Soft tissue mass    left shoulder   Substance abuse ([place])     Past Surgical History:  Procedure Laterality Date   colonscopy     gamma knife radiation 2018 for brain tumor'     hematoma removed from arm Left    MASS EXCISION Left 06/30/2019   Procedure: EXCISION OF SOFT TISSUE  MASS LEFT SHOULDER;  Surgeon: Armandina Gemma, MD;  Location: [facility];  Service: General;  Laterality: Left;  LMA   Family Psychiatric  History:  Social History:  Social History   Substance and Sexual Activity  Alcohol Use Not Currently   Comment: quit Oct 2019     Social History   Substance and Sexual Activity  Drug Use Not Currently   Types: Cocaine, IV, Heroin, MDMA (Ecstacy)   Comment: last reported use; cocaine and heroin in 2019; MDMA Feb 2023    Social History   Socioeconomic History   Marital status: Single    Spouse name: Not on file   Number of children: Not on file   Years of education: Not on file   Highest education level: Not on file  Occupational History   Not on file  Tobacco  Use   Smoking status: Former    Types: Cigarettes   Smokeless tobacco: Current   Tobacco comments:    quit jan 2020  Vaping Use   Vaping Use: Every day  Substance and Sexual Activity   Alcohol use: Not Currently    Comment: quit Oct 2019   Drug use: Not Currently    Types: Cocaine, IV, Heroin, MDMA (Ecstacy)    Comment: last reported use; cocaine and heroin in 2019; MDMA Feb 2023   Sexual activity: Not Currently  Other Topics Concern   Not on file  Social History Narrative   Not on file   Social Determinants of Health   Financial Resource Strain: Not on file  Food Insecurity: Not on  file  Transportation Needs: Not on file  Physical Activity: Not on file  Stress: Not on file  Social Connections: Not on file   Additional Social History:    Allergies:   Allergies  Allergen Reactions   Ingrezza [Valbenazine Tosylate] Other (See Comments)    Severe tongue chewing at 80 mg Tolerates lower doses   Naloxone Palpitations    Pt report MAT Rx is Subutex/Buprenorphine only   Amphetamine-Dextroamphetamine Other (See Comments)    unknown    Labs: No results found for this or any previous visit (from the past 48 hour(s)).  Current Facility-Administered Medications  Medication Dose Route Frequency Provider Last Rate Last Admin   acetaminophen (TYLENOL) tablet 650 mg  650 mg Oral Q6H PRN Nicoletta Dress, Na, MD   650 mg at 06/24/22 1643   Or   acetaminophen (TYLENOL) suppository 650 mg  650 mg Rectal Q6H PRN Nicoletta Dress, Na, MD       buprenorphine (SUBUTEX) sublingual tablet 8 mg  8 mg Sublingual TID Nicoletta Dress, Na, MD   8 mg at 06/28/22 0935   Chlorhexidine Gluconate Cloth 2 % PADS 6 each  6 each Topical Daily Nicoletta Dress, Na, MD   6 each at 06/28/22 0935   diphenhydrAMINE (BENADRYL) capsule 25 mg  25 mg Oral Q4H PRN [place] Eric J, DO       doxycycline (VIBRAMYCIN) 50 MG capsule 50 mg  50 mg Oral Daily Hall, Carole N, DO   50 mg at 06/28/22 0935   enoxaparin (LOVENOX) injection 40 mg  40 mg Subcutaneous Q24H Li, Na, MD   40 mg at 06/27/22 1415   finasteride (PROSCAR) tablet 5 mg  5 mg Oral Daily [place] Donnamarie Poag, DO   5 mg at 06/28/22 0935   gabapentin (NEURONTIN) capsule 400 mg  400 mg Oral BID Nicoletta Dress, Na, MD   400 mg at 06/28/22 0935   haloperidol (HALDOL) tablet 10 mg  10 mg Oral QHS Suella Broad, FNP   10 mg at 06/27/22 2216   hydrOXYzine (ATARAX) tablet 25 mg  25 mg Oral BID AC & HS Li, Na, MD   25 mg at 06/28/22 0845   levothyroxine (SYNTHROID) tablet 150 mcg  150 mcg Oral Daily Li, Na, MD   150 mcg at 06/28/22 0616   modafinil (PROVIGIL) tablet 300 mg  300 mg Oral Daily Cinderella, Margaret A    300 mg at 06/28/22 0935   nicotine (NICODERM CQ - dosed in mg/24 hours) patch 21 mg  21 mg Transdermal Daily Li, Na, MD   21 mg at 06/28/22 0934   pantoprazole (PROTONIX) EC tablet 40 mg  40 mg Oral Daily Li, Na, MD   40 mg at 06/28/22 0935   promethazine (PHENERGAN) tablet 12.5 mg  12.5 mg Oral Q6H PRN Charlann Lange, MD       senna-docusate (Senokot-S) tablet 1 tablet  1 tablet Oral BID [place] Eric J, DO   1 tablet at 06/28/22 0935   sodium chloride 0.9 % bolus 1,000 mL  1,000 mL Intravenous Once Nicoletta Dress, Na, MD       tamsulosin (FLOMAX) capsule 0.8 mg  0.8 mg Oral QHS Donne Hazel, MD   0.8 mg at 06/27/22 2213    Musculoskeletal: Strength & Muscle Tone: within normal limits Gait & Station: normal  Psychiatric Specialty Exam:  Presentation  General Appearance:  Appropriate for Environment  Eye Contact: Fair  Speech: Clear and Coherent  Speech Volume: Normal  Handedness: Right   Mood and Affect  Mood: Euthymic  Affect: Appropriate   Thought Process  Thought Processes: Coherent  Descriptions of Associations:Intact  Orientation:Full (Time, Place and Person)  Thought Content:Logical  History of Schizophrenia/Schizoaffective disorder:Yes  Duration of Psychotic Symptoms:Greater than six months  Hallucinations:Hallucinations: Auditory Description of Auditory Hallucinations: hearing whispers  Ideas of Reference:None  Suicidal Thoughts:Suicidal Thoughts: No  Homicidal Thoughts:Homicidal Thoughts: No   Sensorium  Memory: Immediate Fair; Recent Fair  Judgment: Good  Insight: Good   Executive Functions  Concentration: Good  Attention Span: Good  Recall: Good  Fund of Knowledge: Good  Language: Good   Psychomotor Activity  Psychomotor Activity: Psychomotor Activity: Extrapyramidal Side Effects (EPS); Tremor AIMS Completed?: No   Assets  Assets: Desire for Improvement; Physical Health; Resilience   Sleep  Sleep: Sleep:  Good   Physical Exam: Physical Exam ROS Blood pressure 106/73, pulse 81, temperature 98 F (36.7 C), temperature source Oral, resp. rate 19, height '5\' 10"'$  (1.778 m), weight 94.7 kg, SpO2 97 %. Body mass index is 29.96 kg/m.  Treatment Plan Summary: Daily contact with patient to assess and evaluate symptoms and progress in treatment -Recommend Increasing Haldol 15 mg PO QHS due to continued auditory hallucinations -Consider increasing Benadryl to 50-100 mg PO TID PRN anxiety and tremor this could however worsen urinary retention   Disposition: Recommend psychiatric Inpatient admission when medically cleared. -Psychiatry will continue to follow  Pecolia Ades, DO 06/28/2022 12:16 PM

## 2022-06-28 NOTE — Progress Notes (Signed)
PROGRESS NOTE    Paul Bray  [id number] DOB: Nov 27, 1965 DOA: 06/20/2022 PCP: Jola Baptist, PA-C    Brief Narrative:   Paul Bray is a 57 y.o. 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  Patient reported that he heard voices in his head more frequently and that day prior to admission these voices instruct him to take extra doses of his home propranolol.  He reportedly took 3 propranolol doses in order to acquiesce to these voices.  EDP discussed case with [facility] and poison control.  Poison control recommended initiation of glucagon drip and medical admission for observation.  [facility] was consulted for admission for suicidal ideation with propranolol overdose.  Assessment & Plan:   Propranolol overdose Patient presenting to the ED with increased auditory hallucinations with suicidal ideations in which he took 3 doses of his propranolol.  Patient was hypotensive with BP 90/72 on admission with heart rate 63.  Patient was given IV fluid bolus and was placed on glucagon drip per recommendations of poison control per discussion with ED physician.  Glucagon drip was titrated off on 06/22/2022 and BP and heart rate has remained stable.  Schizophrenia with auditory hallucinations Suicidal ideation Patient recently admitted to inpatient psychiatry.  Continues with auditory hallucinations and with suicidal ideation with propranolol overdose as above. -- Psychiatry following, appreciate assistance -- Remains under IVC -- Haldol 10 mg p.o. qHS -- Continue 1:1 sitter; suicide precautions -- Further per psychiatry, remains medically stable for discharge to  inpatient psych  Hx essential hypertension Patient prescribed propranolol 20 mg p.o. daily as needed for "blood pressure".  Given his attempted overdose with blood pressure remaining stable during hospitalization, 106/73 this morning, will discontinue.  History of polysubstance abuse UDS on admission negative  Hypothyroidism TSH 3.257 on admission, within normal limits. --Levothyroxine 150 mcg p.o. daily  Acute renal failure: Resolved Creatinine 1.4 at admission.  Etiology likely prerenal azotemia in the setting of overdose as above.  Supported with IV fluid hydration with improvement of creatinine to 1.03.  Acute urinary retention, recurrent Prostatic hyperplasia Patient had Foley catheter placed on 06/17/2022 due to urinary retention in the ED.  Renal ultrasound 06/17/2022 negative for hydronephrosis.  Case was discussed with urology, Dr. Gloriann Loan by previous hospitalist who recommended voiding trial and Foley catheter was removed on 2/28.  Patient with decreased urinary output and urinary retention noted on bladder scan up to 1 L.  Attempts at Foley catheter placement by RN unsuccessful and urology was consulted and patient underwent coud catheter placement by Dr. Abner Greenspan on 06/25/2022. -- Tamsulosin 0.8 mg p.o. daily -- Finasteride 5 mg p.o. daily -- Outpatient follow-up with urology for void trial, likely will need BPH evaluation with TRUS for size, cystoscopy, uroflow and ultimately likely bladder obstruction procedure  Tobacco use disorder Counseled on need for abstinence/cessation -- Nicotine patch    DVT prophylaxis: enoxaparin (LOVENOX) injection 40 mg Start: 06/21/22 1300 SCDs Start: 06/21/22 [id number]    Code Status: Full Code Family Communication: No family present at bedside this morning  Disposition Plan:  Level of care: Med-Surg Status is: Inpatient Remains inpatient appropriate because: Under IVC, medically stable for discharge inpatient psych, further per psychiatry     Consultants:  Psychiatry Urology, Dr. Abner Greenspan  Procedures:  Foley catheter replacement 06/25/2022  Antimicrobials:  None   Subjective: Patient seen examined bedside, lying in bed.  Sitter present. Under IVC.  Continues with auditory hallucinations but unable to verbalize what he is being told.  Denies any suicidal ideations yesterday or so far today.  Requesting increased frequency of Benadryl.  No other complaints or concerns at this time.  Denies headache, no chest pain, no shortness of breath, no abdominal pain.  Medically stable for discharge once inpatient psych bed obtained.  No acute concerns overnight per nursing staff.   Objective: Vitals:   06/26/22 0711 06/26/22 1910 06/26/22 2345 06/27/22 1156  BP: 108/72 118/81 132/87 106/73  Pulse: 64 79 78 81  Resp: '17 18 18 19  '$ Temp: 97.8 F (36.6 C) 98.3 F (36.8 C) 98 F (36.7 C)   TempSrc: Oral Oral Oral   SpO2: 98% 98% 98% 97%  Weight:      Height:        Intake/Output Summary (Last 24 hours) at 06/28/2022 1001 Last data filed at 06/27/2022 1430 Gross per 24 hour  Intake 240 ml  Output 600 ml  Net -360 ml   Filed Weights   06/22/22 0614 06/23/22 0500 06/24/22 0500  Weight: 96.3 kg 96.4 kg 94.7 kg    Examination:  Physical Exam: GEN: NAD, alert and oriented x 3, chronically ill appearance, appears older than stated age HEENT: NCAT, PERRL, EOMI, sclera clear, MMM PULM: CTAB w/o wheezes/crackles, normal respiratory effort, on room air  CV: RRR w/o M/G/R GI: abd soft, NTND, + BS GU: Foley catheter noted in place draining clear yellow urine in collection bag MSK: no peripheral edema, moves all extremities independently NEURO: No focal deficits PSYCH: Denies visual hallucination but continues with intermittent auditory hallucinations, denies SI/HI Integumentary: dry/intact, no rashes or wounds    Data Reviewed: I have personally reviewed following labs and imaging studies  CBC: Recent Labs  Lab 06/21/22 1203  06/22/22 0025  WBC 7.7 4.6  HGB 12.2* 11.0*  HCT 35.6* 31.3*  MCV 92.7 92.1  PLT 207 [id number]   Basic Metabolic Panel: Recent Labs  Lab 06/21/22 1203 06/22/22 0025  NA  --  139  K  --  3.7  CL  --  111  CO2  --  22  GLUCOSE  --  113*  BUN  --  18  CREATININE 1.17 1.03  CALCIUM  --  7.6*   GFR: Estimated Creatinine Clearance: 94.7 mL/min (by C-G formula based on SCr of 1.03 mg/dL). Liver Function Tests: No results for input(s): "AST", "ALT", "ALKPHOS", "BILITOT", "PROT", "ALBUMIN" in the last 168 hours.  No results for input(s): "LIPASE", "AMYLASE" in the last 168 hours. No results for input(s): "AMMONIA" in the last 168 hours. Coagulation Profile: No results for input(s): "INR", "PROTIME" in the last 168 hours.  Cardiac Enzymes: No results for input(s): "CKTOTAL", "CKMB", "CKMBINDEX", "TROPONINI" in the last 168 hours. BNP (last 3 results) No results for input(s): "PROBNP" in the last 8760 hours. HbA1C: No results for input(s): "HGBA1C" in the last 72 hours. CBG: No results for input(s): "GLUCAP" in the last 168 hours. Lipid Profile: No results for input(s): "CHOL", "HDL", "LDLCALC", "TRIG", "CHOLHDL", "LDLDIRECT" in the last 72 hours. Thyroid Function Tests: No results for input(s): "TSH", "T4TOTAL", "FREET4", "T3FREE", "THYROIDAB" in the last 72 hours.  Anemia Panel: No results for input(s): "VITAMINB12", "FOLATE", "FERRITIN", "TIBC", "IRON", "RETICCTPCT" in the last 72 hours. Sepsis Labs: Recent Labs  Lab 06/21/22 1713  LATICACIDVEN  0.9    Recent Results (from the past 240 hour(s))  Blood Culture (routine single)     Status: None   Collection Time: 06/20/22 11:51 PM   Specimen: BLOOD LEFT HAND  Result Value Ref Range Status   Specimen Description BLOOD LEFT HAND  Final   Special Requests   Final    BOTTLES DRAWN AEROBIC ONLY Blood Culture results may not be optimal due to an inadequate volume of blood received in culture bottles   Culture   Final    NO  GROWTH 5 DAYS Performed at [facility], [place] [address] [place], [place] [address]    Report Status 06/26/2022 FINAL  Final  MRSA Next Gen by PCR, Nasal     Status: None   Collection Time: 06/21/22  6:27 PM   Specimen: Nasal Mucosa; Nasal Swab  Result Value Ref Range Status   MRSA by PCR Next Gen NOT DETECTED NOT DETECTED Final    Comment: (NOTE) The GeneXpert MRSA Assay (FDA approved for NASAL specimens only), is one component of a comprehensive MRSA colonization surveillance program. It is not intended to diagnose MRSA infection nor to guide or monitor treatment for MRSA infections. Test performance is not FDA approved in patients less than 49 years old. Performed at [facility], [place] [address], [place], [place] [address]          Radiology Studies: No results found.      Scheduled Meds:  buprenorphine  8 mg Sublingual TID   Chlorhexidine Gluconate Cloth  6 each Topical Daily   doxycycline  50 mg Oral Daily   enoxaparin (LOVENOX) injection  40 mg Subcutaneous Q24H   finasteride  5 mg Oral Daily   gabapentin  400 mg Oral BID   haloperidol  10 mg Oral QHS   hydrOXYzine  25 mg Oral BID AC & HS   levothyroxine  150 mcg Oral Daily   modafinil  300 mg Oral Daily   nicotine  21 mg Transdermal Daily   pantoprazole  40 mg Oral Daily   senna-docusate  1 tablet Oral BID   tamsulosin  0.8 mg Oral QHS   Continuous Infusions:  sodium chloride       LOS: 7 days    Time spent: 48 minutes spent on chart review, discussion with nursing staff, consultants, updating family and interview/physical exam; more than 50% of that time was spent in counseling and/or coordination of care.    Gennesis Hogland J [place] DO [facility] Available via Epic secure chat 7am-7pm After these hours, please refer to coverage provider listed on [email or web address] 06/28/2022, 10:01 AM

## 2022-06-29 DIAGNOSIS — T50902A Poisoning by unspecified drugs, medicaments and biological substances, intentional self-harm, initial encounter: Secondary | ICD-10-CM

## 2022-06-29 MED ORDER — MODAFINIL 100 MG PO TABS: 300.0000 mg | ORAL_TABLET | Freq: Every day | ORAL | 0 refills | Status: DC

## 2022-06-29 MED ORDER — MIRTAZAPINE 15 MG PO TABS: 7.5000 mg | ORAL_TABLET | Freq: Every day | ORAL | Status: DC

## 2022-06-29 MED ORDER — HALOPERIDOL 5 MG PO TABS: 15.0000 mg | ORAL_TABLET | Freq: Every day | ORAL | 2 refills | Status: DC

## 2022-06-29 MED ORDER — DIPHENHYDRAMINE HCL 25 MG PO CAPS
50.0000 mg | ORAL_CAPSULE | ORAL | Status: DC | PRN
  Administered 2022-06-29: 50 mg via ORAL
  Filled 2022-06-29: qty 2

## 2022-06-29 MED ORDER — HALOPERIDOL 5 MG PO TABS
15.0000 mg | ORAL_TABLET | Freq: Every day | ORAL | Status: DC
  Filled 2022-06-29: qty 3

## 2022-06-29 MED ORDER — DOXYCYCLINE HYCLATE 50 MG PO CAPS: 50.0000 mg | ORAL_CAPSULE | Freq: Every day | ORAL | 1 refills | Status: DC

## 2022-06-29 MED ORDER — MIRTAZAPINE 7.5 MG PO TABS: 7.5000 mg | ORAL_TABLET | Freq: Every day | ORAL | 2 refills | Status: DC

## 2022-06-29 MED ORDER — FINASTERIDE 5 MG PO TABS: 5.0000 mg | ORAL_TABLET | Freq: Every day | ORAL | 2 refills | Status: AC

## 2022-06-29 NOTE — Discharge Summary (Signed)
Physician Discharge Summary  Paul Bray [id number] DOB: 07-01-1965 DOA: 06/20/2022  PCP: Jola Baptist, PA-C  Admit date: 06/20/2022 Discharge date: 06/29/2022  Admitted From: Home Disposition: Home  Recommendations for Outpatient Follow-up:  Follow up with PCP in 1-2 weeks Outpatient follow-up with [name], PA-C with psychiatry Follow-up with Dr. Tammi Klippel for further management of obstructive uropathy likely related to BPH Haldol increased to 15 mg p.o. nightly, started on Remeron 7.5 mg p.o. nightly Modafinil to creased to 300 mg p.o. daily, consider further titration off outpatient. Propranolol discontinued Started on finasteride for bladder outlet obstruction Please obtain BMP/CBC in one week Please follow up on the following pending results:  Home Health: No Equipment/Devices: Foley catheter  Discharge Condition: Stable CODE STATUS: Full code Diet recommendation: Regular diet  History of present illness:  Paul Bray is a 57 y.o. 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  Patient reported that he heard voices in his head more frequently and that day prior to admission these voices instruct him to take extra doses of his home propranolol.  He reportedly took 3 propranolol doses in order to acquiesce to these voices.  EDP discussed case with [facility] and poison control.  Poison control recommended initiation of glucagon drip and medical admission for observation.  TRH was consulted for admission for suicidal ideation with propranolol overdose.   Hospital course:  Propranolol overdose Patient presenting to the ED with increased  auditory hallucinations with suicidal ideations in which he took 3 doses of his propranolol.  Patient was hypotensive with BP 90/72 on admission with heart rate 63.  Patient was given IV fluid bolus and was placed on glucagon drip per recommendations of poison control per discussion with ED physician.  Glucagon drip was titrated off on 06/22/2022 and BP and heart rate has remained stable.  Propranolol was discontinued.   Schizophrenia with auditory hallucinations Suicidal ideation Patient recently admitted to inpatient psychiatry.  Continues with auditory hallucinations and with suicidal ideation with propranolol overdose as above.  Patient was placed under involuntary commitment.  Psychiatry was consulted and followed during hospital course.  Haldol was titrated to 15 mg p.o. nightly.  Patient was started on Remeron 7.5 mg p.o. nightly.  Patient now without hallucinations and denies suicidal ideations.  Psychiatry recommend resending IVC.  Outpatient follow-up with [name], PA-C outpatient.   Hx essential hypertension Patient prescribed propranolol 20 mg p.o. daily as needed for "blood pressure". Given his attempted overdose with blood pressure remaining stable during hospitalization, will discontinue propranolol.   History of polysubstance abuse UDS on admission negative   Hypothyroidism TSH 3.257 on admission, within normal limits. Levothyroxine 150 mcg p.o. daily   Acute renal failure: Resolved Creatinine 1.4 at admission.  Etiology likely prerenal azotemia in the setting of overdose as above.  Supported with IV fluid hydration with improvement of creatinine to 1.03.   Acute urinary retention, recurrent Prostatic hyperplasia Patient had Foley catheter placed on 06/17/2022 due to urinary retention in the ED.  Renal ultrasound 06/17/2022 negative for hydronephrosis.  Case was discussed with urology, Dr. Gloriann Loan by previous hospitalist who recommended voiding trial and Foley catheter was removed  on 2/28.  Patient with decreased urinary output and urinary retention noted on bladder scan up  to 1 L.  Attempts at Foley catheter placement by RN unsuccessful and urology was consulted and patient underwent coud catheter placement by Dr. Abner Greenspan on 06/25/2022. Outpatient follow-up with urology for void trial, likely will need BPH evaluation with TRUS for size, cystoscopy, uroflow and ultimately likely bladder obstruction procedure.  Continue   Tobacco use disorder Counseled on need for abstinence/cessation  Discharge Diagnoses:  Principal Problem:   Propranolol overdose Active Problems:   Acquired hypothyroidism   Paranoid schizophrenia ([place])   Undifferentiated schizophrenia ([place])   Stimulant use disorder   GAD (generalized anxiety disorder)   Encounter for monitoring Suboxone maintenance therapy   Uncomplicated opioid dependence ([place])   Opioid use disorder, moderate, on maintenance therapy ([facility])   Cigarette nicotine dependence without complication   AKI (acute kidney injury) ([place])   Lactic acidemia   Obesity (BMI 30-39.9)   Auditory hallucination   Anxiety   Attention deficit hyperactivity disorder (ADHD)   Drug overdose   Hypotension   Adverse reaction to beta-blocker    Discharge Instructions  Discharge Instructions     Call MD for:  difficulty breathing, headache or visual disturbances   Complete by: As directed    Call MD for:  extreme fatigue   Complete by: As directed    Call MD for:  persistant dizziness or light-headedness   Complete by: As directed    Call MD for:  persistant nausea and vomiting   Complete by: As directed    Call MD for:  severe uncontrolled pain   Complete by: As directed    Call MD for:  temperature >100.4   Complete by: As directed    Diet - low sodium heart healthy   Complete by: As directed    Increase activity slowly   Complete by: As directed       Allergies as of 06/29/2022       Reactions   Ingrezza [valbenazine Tosylate] Other  (See Comments)   Severe tongue chewing at 80 mg Tolerates lower doses   Naloxone Palpitations   Pt report MAT Rx is Subutex/Buprenorphine only   Amphetamine-dextroamphetamine Other (See Comments)   unknown        Medication List     STOP taking these medications    propranolol 20 MG tablet Commonly known as: INDERAL       TAKE these medications    buprenorphine 8 MG Subl SL tablet Commonly known as: SUBUTEX Place 8 mg under the tongue 3 (three) times daily.   diphenhydrAMINE 50 MG tablet Commonly known as: BENADRYL Take 2-4 tablets q6h as needed for restlessness related to Haldol What changed:  how much to take how to take this when to take this reasons to take this additional instructions   doxycycline 50 MG capsule Commonly known as: VIBRAMYCIN Take 1 capsule (50 mg total) by mouth daily.   finasteride 5 MG tablet Commonly known as: PROSCAR Take 1 tablet (5 mg total) by mouth daily. Start taking on: June 30, 2022   gabapentin 400 MG capsule Commonly known as: NEURONTIN Take 1 capsule (400 mg total) by mouth 2 (two) times daily for 180 doses.   haloperidol 5 MG tablet Commonly known as: HALDOL Take 3 tablets (15 mg total) by mouth at bedtime. What changed:  medication strength how much to take when to take this   hydrOXYzine 25 MG tablet Commonly known as: ATARAX Take 25 mg by mouth in the morning and at bedtime.   levothyroxine 150 MCG tablet Commonly  known as: Synthroid Take 1 tablet (150 mcg total) by mouth daily.   mirtazapine 7.5 MG tablet Commonly known as: REMERON Take 1 tablet (7.5 mg total) by mouth at bedtime.   modafinil 100 MG tablet Commonly known as: PROVIGIL Take 3 tablets (300 mg total) by mouth daily. Start taking on: June 30, 2022 What changed:  medication strength how much to take   nicotine 21 mg/24hr patch Commonly known as: NICODERM CQ - dosed in mg/24 hours Place 21 mg onto the skin daily.   tamsulosin 0.4 MG  Caps capsule Commonly known as: Flomax Take 1 capsule (0.4 mg total) by mouth at bedtime.        Follow-up Information     Jola Baptist, PA-C. Schedule an appointment as soon as possible for a visit.   Specialty: Physician Assistant Contact information: [place] [address] [phone]         Alexis Frock, MD. Schedule an appointment as soon as possible for a visit.   Specialty: Urology Contact information: [place] [address] [phone]         Dara Hoyer, PA-C. Schedule an appointment as soon as possible for a visit.   Specialty: Psychiatry Contact information: [place] [address] [phone]                Allergies  Allergen Reactions   Ingrezza [Valbenazine Tosylate] Other (See Comments)    Severe tongue chewing at 80 mg Tolerates lower doses   Naloxone Palpitations    Pt report MAT Rx is Subutex/Buprenorphine only   Amphetamine-Dextroamphetamine Other (See Comments)    unknown    Consultations: Urology Psychiatry   Procedures/Studies: ECHOCARDIOGRAM LIMITED  Result Date: 06/22/2022    ECHOCARDIOGRAM LIMITED REPORT   Patient Name:   Paul Bray Date of Exam: 06/22/2022 Medical Rec #:  [id number]        Height:       70.0 in Accession #:    [id number]       Weight:       212.3 lb Date of Birth:  July 13, 1965        BSA:          2.141 m Patient Age:    57 years         BP:           99/74 mmHg Patient Gender: M                HR:           70 bpm. Exam Location:  Inpatient Procedure: Limited Echo, Cardiac Doppler and Limited Color Doppler Indications:    CHF-Acute Systolic [id number]  History:        Patient has no prior history of Echocardiogram examinations.                 Drug Overdose, Signs/Symptoms:Hypotension; Risk Factors:Current                 Smoker.  Sonographer:    Greer Pickerel Referring Phys: Cristal Generous  Sonographer Comments: Patient is obese. Image  acquisition challenging due to respiratory motion. IMPRESSIONS  1. Left ventricular ejection fraction, by estimation, is 65 to 70%. The left ventricle has normal function. The left ventricle has no regional wall motion abnormalities. Left ventricular diastolic function could not be evaluated.  2. Right ventricular systolic function is normal. The right ventricular size is normal. There is  normal pulmonary artery systolic pressure. The estimated right ventricular systolic pressure is Q000111Q mmHg.  3. The mitral valve is normal in structure. Trivial mitral valve regurgitation. No evidence of mitral stenosis.  4. The aortic valve was not well visualized. Aortic valve regurgitation is not visualized. No aortic stenosis is present.  5. The inferior vena cava is dilated in size with >50% respiratory variability, suggesting right atrial pressure of 8 mmHg. FINDINGS  Left Ventricle: Left ventricular ejection fraction, by estimation, is 65 to 70%. The left ventricle has normal function. The left ventricle has no regional wall motion abnormalities. The left ventricular internal cavity size was normal in size. There is  no left ventricular hypertrophy. Left ventricular diastolic function could not be evaluated. Right Ventricle: The right ventricular size is normal. No increase in right ventricular wall thickness. Right ventricular systolic function is normal. There is normal pulmonary artery systolic pressure. The tricuspid regurgitant velocity is 1.01 m/s, and  with an assumed right atrial pressure of 8 mmHg, the estimated right ventricular systolic pressure is Q000111Q mmHg. Left Atrium: Left atrial size was normal in size. Right Atrium: Right atrial size was normal in size. Pericardium: There is no evidence of pericardial effusion. Mitral Valve: The mitral valve is normal in structure. Trivial mitral valve regurgitation. No evidence of mitral valve stenosis. Tricuspid Valve: The tricuspid valve is normal in structure. Tricuspid  valve regurgitation is trivial. No evidence of tricuspid stenosis. Aortic Valve: The aortic valve was not well visualized. Aortic valve regurgitation is not visualized. No aortic stenosis is present. Pulmonic Valve: The pulmonic valve was normal in structure. Pulmonic valve regurgitation is not visualized. No evidence of pulmonic stenosis. Aorta: The aortic root is normal in size and structure. Venous: The inferior vena cava is dilated in size with greater than 50% respiratory variability, suggesting right atrial pressure of 8 mmHg. IAS/Shunts: No atrial level shunt detected by color flow Doppler. Additional Comments: Spectral Doppler performed. Color Doppler performed.  LEFT VENTRICLE PLAX 2D LVIDd:         4.00 cm LVIDs:         2.60 cm LV PW:         1.10 cm LV IVS:        0.80 cm  MITRAL VALVE               TRICUSPID VALVE MV Area (PHT): 4.06 cm    TR Peak grad:   4.1 mmHg MV Decel Time: 187 msec    TR Vmax:        101.00 cm/s MV E velocity: 93.50 cm/s MV A velocity: 78.30 cm/s MV E/A ratio:  1.19 Fransico Him MD Electronically signed by Fransico Him MD Signature Date/Time: 06/22/2022/12:41:58 PM    Final    DG Chest Portable 1 View  Result Date: 06/20/2022 CLINICAL DATA:  Hypotension EXAM: PORTABLE CHEST 1 VIEW COMPARISON:  01/10/2018 FINDINGS: Stable cardiomediastinal silhouette. Bibasilar atelectasis. No focal pneumonia, pleural effusion, or pneumothorax. No acute osseous abnormality. IMPRESSION: No active disease. Electronically Signed   By: Placido Sou M.D.   On: 06/20/2022 23:44   US Renal  Result Date: 06/17/2022 CLINICAL DATA:  urinary retention EXAM: RENAL / URINARY TRACT ULTRASOUND COMPLETE COMPARISON:  CT 12/24/2020 FINDINGS: Right Kidney: Renal measurements: 11.4 x 5.2 x 5.2 cm = volume: 162 mL. Echogenicity within normal limits. No mass or hydronephrosis visualized. Left Kidney: Renal measurements: 12.0 x 5.3 x 5.7 cm = volume: 190 mL. Echogenicity within normal limits. No mass or  hydronephrosis visualized. Bladder: Decompressed by Foley catheter. Other: None. IMPRESSION: No evidence of obstructive uropathy. Electronically Signed   By: Davina Poke D.O.   On: 06/17/2022 12:10     Subjective: Patient seen examined bedside, resting comfortably.  Mother present.  Seen by psychiatry with recommendations of resending IVC as an outpatient has stabilized with no further hallucinations/suicidal ideations.  Patient with no other questions or concerns at this time.  Denies headache, no dizziness, no chest pain, no palpitations, no shortness of breath, no abdominal pain, no fever/chills/night sweats, no nausea/vomiting/diarrhea, no focal weakness, no auditory/visual donations and denies suicidal ideations.  Discharge Exam: Vitals:   06/29/22 0604 06/29/22 0720  BP: 98/67 107/80  Pulse: 60 81  Resp: 19 17  Temp: (!) 97.5 F (36.4 C) (!) 97.5 F (36.4 C)  SpO2: 95% 96%   Vitals:   06/28/22 1905 06/29/22 0500 06/29/22 0604 06/29/22 0720  BP: 99/63  98/67 107/80  Pulse: 65  60 81  Resp: '19  19 17  '$ Temp:   (!) 97.5 F (36.4 C) (!) 97.5 F (36.4 C)  TempSrc:   Oral Oral  SpO2: 97%  95% 96%  Weight:  89.9 kg    Height:        Physical Exam: GEN: NAD, alert and oriented x 3, slightly anxious, appears older than stated age HEENT: NCAT, PERRL, EOMI, sclera clear, MMM PULM: CTAB w/o wheezes/crackles, normal respiratory effort, on room air CV: RRR w/o M/G/R GI: abd soft, NTND, NABS, no R/G/M GU: Foley catheter noted draining clear yellow urine in collection canister MSK: no peripheral edema, muscle strength globally intact 5/5 bilateral upper/lower extremities NEURO: CN II-XII intact, no focal deficits, sensation to light touch intact PSYCH: Anxious mood, denies SI/HI, no auditory/visual hallucinations Integumentary: dry/intact, no rashes or wounds    The results of significant diagnostics from this hospitalization (including imaging, microbiology, ancillary and  laboratory) are listed below for reference.     Microbiology: Recent Results (from the past 240 hour(s))  Blood Culture (routine single)     Status: None   Collection Time: 06/20/22 11:51 PM   Specimen: BLOOD LEFT HAND  Result Value Ref Range Status   Specimen Description BLOOD LEFT HAND  Final   Special Requests   Final    BOTTLES DRAWN AEROBIC ONLY Blood Culture results may not be optimal due to an inadequate volume of blood received in culture bottles   Culture   Final    NO GROWTH 5 DAYS Performed at [facility], [place] [address], [place], [place] [address]    Report Status 06/26/2022 FINAL  Final  MRSA Next Gen by PCR, Nasal     Status: None   Collection Time: 06/21/22  6:27 PM   Specimen: Nasal Mucosa; Nasal Swab  Result Value Ref Range Status   MRSA by PCR Next Gen NOT DETECTED NOT DETECTED Final    Comment: (NOTE) The GeneXpert MRSA Assay (FDA approved for NASAL specimens only), is one component of a comprehensive MRSA colonization surveillance program. It is not intended to diagnose MRSA infection nor to guide or monitor treatment for MRSA infections. Test performance is not FDA approved in patients less than 72 years old. Performed at [facility], [place] [address] [place], [place] [address]      Labs: BNP (last 3 results) No results for input(s): "BNP" in the last 8760 hours. Basic Metabolic Panel: Recent Labs  Lab 06/28/22 1755  NA 138  K 4.4  CL 104  CO2 24  GLUCOSE 109*  BUN 14  CREATININE 0.99  CALCIUM 8.7*   Liver Function Tests: Recent Labs  Lab 06/28/22 1755  AST 59*  ALT 83*  ALKPHOS 82  BILITOT 0.4  PROT 5.8*  ALBUMIN 3.4*   No results for input(s): "LIPASE", "AMYLASE" in the last 168 hours. No results for input(s): "AMMONIA" in the last 168 hours. CBC: Recent Labs  Lab 06/28/22 1755  WBC 3.6*  NEUTROABS 1.7  HGB 12.8*  HCT 37.7*  MCV 93.5  PLT 177   Cardiac Enzymes: No results for input(s): "CKTOTAL", "CKMB",  "CKMBINDEX", "TROPONINI" in the last 168 hours. BNP: Invalid input(s): "POCBNP" CBG: No results for input(s): "GLUCAP" in the last 168 hours. D-Dimer No results for input(s): "DDIMER" in the last 72 hours. Hgb A1c No results for input(s): "HGBA1C" in the last 72 hours. Lipid Profile No results for input(s): "CHOL", "HDL", "LDLCALC", "TRIG", "CHOLHDL", "LDLDIRECT" in the last 72 hours. Thyroid function studies No results for input(s): "TSH", "T4TOTAL", "T3FREE", "THYROIDAB" in the last 72 hours.  Invalid input(s): "FREET3" Anemia work up No results for input(s): "VITAMINB12", "FOLATE", "FERRITIN", "TIBC", "IRON", "RETICCTPCT" in the last 72 hours. Urinalysis    Component Value Date/Time   COLORURINE YELLOW 06/21/2022 0128   APPEARANCEUR HAZY (A) 06/21/2022 0128   LABSPEC 1.009 06/21/2022 0128   PHURINE 6.0 06/21/2022 0128   GLUCOSEU NEGATIVE 06/21/2022 0128   HGBUR MODERATE (A) 06/21/2022 0128   BILIRUBINUR NEGATIVE 06/21/2022 0128   KETONESUR NEGATIVE 06/21/2022 0128   PROTEINUR 30 (A) 06/21/2022 0128   UROBILINOGEN 1.0 02/07/2011 2021   NITRITE NEGATIVE 06/21/2022 0128   LEUKOCYTESUR MODERATE (A) 06/21/2022 0128   Sepsis Labs Recent Labs  Lab 06/28/22 1755  WBC 3.6*   Microbiology Recent Results (from the past 240 hour(s))  Blood Culture (routine single)     Status: None   Collection Time: 06/20/22 11:51 PM   Specimen: BLOOD LEFT HAND  Result Value Ref Range Status   Specimen Description BLOOD LEFT HAND  Final   Special Requests   Final    BOTTLES DRAWN AEROBIC ONLY Blood Culture results may not be optimal due to an inadequate volume of blood received in culture bottles   Culture   Final    NO GROWTH 5 DAYS Performed at [facility], [place] [address], [place], [place] [address]    Report Status 06/26/2022 FINAL  Final  MRSA Next Gen by PCR, Nasal     Status: None   Collection Time: 06/21/22  6:27 PM   Specimen: Nasal Mucosa; Nasal Swab  Result Value Ref  Range Status   MRSA by PCR Next Gen NOT DETECTED NOT DETECTED Final    Comment: (NOTE) The GeneXpert MRSA Assay (FDA approved for NASAL specimens only), is one component of a comprehensive MRSA colonization surveillance program. It is not intended to diagnose MRSA infection nor to guide or monitor treatment for MRSA infections. Test performance is not FDA approved in patients less than 61 years old. Performed at [facility], [place] [address], [place], [place] [address]      Time coordinating discharge: Over 30 minutes  SIGNED:   Armetta Henri J [place] DO  [facility] 06/29/2022, 11:42 AM

## 2022-06-29 NOTE — Consult Note (Addendum)
[name] Consult   Reason for Consult:  Patient with history of schizophrenia and auditory hallucination presented with propranolol overdosed. He is hostile during exam  Referring Physician:  Dr. [place] Patient Identification: Paul Bray MRN:  [id number] Principal Diagnosis: Propranolol overdose Diagnosis:  Principal Problem:   Propranolol overdose Active Problems:   Acquired hypothyroidism   Paranoid schizophrenia ([place])   Undifferentiated schizophrenia ([place])   Stimulant use disorder   GAD (generalized anxiety disorder)   Encounter for monitoring Suboxone maintenance therapy   Uncomplicated opioid dependence ([place])   Opioid use disorder, moderate, on maintenance therapy (H[facility])   Cigarette nicotine dependence without complication   AKI (acute kidney injury) ([place])   Lactic acidemia   Obesity (BMI 30-39.9)   Auditory hallucination   Anxiety   Attention deficit hyperactivity disorder (ADHD)   Drug overdose   Hypotension   Adverse reaction to beta-blocker   Total Time spent with patient: 15 minutes  Subjective:   Paul Bray is a 57 y.o. male seen and evaluated face-to-face by this provider.   Patient seen, chart reviewed. Mother present for majority of exam with pt consent.   Overall, pt with significant improvement over last week. He denies any hallucinations since ~Th or F, and we were able to have a frank back and forth discussion about his overall mental health. He is much less internally distracted than previously. In particular, was able to engage in a r/b/se discussion of modafanil without pt resorting to yelling, demands, etc. We discussed he likely requires a higher dose of haldol than otherwise due to this medication, and in the future it might need to be decreased especially if akathisia worsens. Continues to deny SI, HI, AH/VH - pt's mother at bedside says she noticed him potentially responding on Friday but not since then.  Discussed that he  was being kept in the hospital due to meeting IVC criteria (and would be kept as long as he did) but would be more likely to be treated to discharge while medically admitted than transfer to [facility] due to [place]. We discussed original reason for IVC (CAH with harm to self and others) and how this no longer applied; all parties in agreement without safety concerns expressed. Consented to trial mirtazapine for akathisia (see physical exam), discussed orthostasis. Discussed potential future med changes as below, but pt, mother, and myself in agreement that he needs to be stable before attempting cross taper (especially as he still has abilify in his system from [place] [facility]). We discussed extensively return precautions, including as always suicidality but also worsening akathisia or inability to sleep - sleeping OK in hospital per report.   HPI:" Paul Bray is a 57 y.o. male admitted medically for 06/20/2022  9:20 PM for an intentional beta-blocker overdose (unclear if suicidal intent - reportedly took for high BP and anxiety). 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 Prior hx polysubstance abuse (cocaine, heroin, MDMA)."   Past Psychiatric History: Generalized anxiety disorder, polysubstance abuse, schizophrenia with auditory hallucinations.  Risk to Self:   prior Risk to Others:   prior Prior Inpatient Therapy:   yes Prior Outpatient Therapy:   yes  Past Medical History:  Past Medical History:  Diagnosis Date   ADHD    Anxiety    Brain tumor ([place])    balance and occ memory issues and headaches  Brain tumor ([place])    sees [facility] q year   Depression    Headache    migraine and tension   Hypothyroidism    Soft tissue mass    left shoulder   Substance abuse [facility])     Past Surgical History:  Procedure Laterality Date   colonscopy     gamma knife  radiation 2018 for brain tumor'     hematoma removed from arm Left    MASS EXCISION Left 06/30/2019   Procedure: EXCISION OF SOFT TISSUE  MASS LEFT SHOULDER;  Surgeon: Armandina Gemma, MD;  Location: [place];  Service: General;  Laterality: Left;  LMA   Family History: History reviewed. No pertinent family history. Family Psychiatric  History:  Social History:  Social History   Substance and Sexual Activity  Alcohol Use Not Currently   Comment: quit Oct 2019     Social History   Substance and Sexual Activity  Drug Use Not Currently   Types: Cocaine, IV, Heroin, MDMA (Ecstacy)   Comment: last reported use; cocaine and heroin in 2019; MDMA Feb 2023    Social History   Socioeconomic History   Marital status: Single    Spouse name: Not on file   Number of children: Not on file   Years of education: Not on file   Highest education level: Not on file  Occupational History   Not on file  Tobacco Use   Smoking status: Former    Types: Cigarettes   Smokeless tobacco: Current   Tobacco comments:    quit jan 2020  Vaping Use   Vaping Use: Every day  Substance and Sexual Activity   Alcohol use: Not Currently    Comment: quit Oct 2019   Drug use: Not Currently    Types: Cocaine, IV, Heroin, MDMA (Ecstacy)    Comment: last reported use; cocaine and heroin in 2019; MDMA Feb 2023   Sexual activity: Not Currently  Other Topics Concern   Not on file  Social History Narrative   Not on file   Social Determinants of Health   Financial Resource Strain: Not on file  Food Insecurity: Not on file  Transportation Needs: Not on file  Physical Activity: Not on file  Stress: Not on file  Social Connections: Not on file   Additional Social History:    Allergies:   Allergies  Allergen Reactions   Ingrezza [Valbenazine Tosylate] Other (See Comments)    Severe tongue chewing at 80 mg Tolerates lower doses   Naloxone Palpitations    Pt report MAT Rx is  Subutex/Buprenorphine only   Amphetamine-Dextroamphetamine Other (See Comments)    unknown    Labs:  Results for orders placed or performed during the hospital encounter of 06/20/22 (from the past 48 hour(s))  CBC with Differential/Platelet     Status: Abnormal   Collection Time: 06/28/22  5:55 PM  Result Value Ref Range   WBC 3.6 (L) 4.0 - 10.5 K/uL   RBC 4.03 (L) 4.22 - 5.81 MIL/uL   Hemoglobin 12.8 (L) 13.0 - 17.0 g/dL   HCT 37.7 (L) 39.0 - 52.0 %   MCV 93.5 80.0 - 100.0 fL   MCH 31.8 26.0 - 34.0 pg   MCHC 34.0 30.0 - 36.0 g/dL   RDW 13.3 11.5 - 15.5 %   Platelets 177 150 - 400 K/uL   nRBC 0.0 0.0 - 0.2 %   Neutrophils Relative % 49 %   Neutro Abs 1.7 1.7 -  7.7 K/uL   Lymphocytes Relative 41 %   Lymphs Abs 1.5 0.7 - 4.0 K/uL   Monocytes Relative 9 %   Monocytes Absolute 0.3 0.1 - 1.0 K/uL   Eosinophils Relative 1 %   Eosinophils Absolute 0.0 0.0 - 0.5 K/uL   Basophils Relative 0 %   Basophils Absolute 0.0 0.0 - 0.1 K/uL   Immature Granulocytes 0 %   Abs Immature Granulocytes 0.01 0.00 - 0.07 K/uL    Comment: Performed at [facility] [address], [place], [place] [address]  Comprehensive metabolic panel     Status: Abnormal   Collection Time: 06/28/22  5:55 PM  Result Value Ref Range   Sodium 138 135 - 145 mmol/L   Potassium 4.4 3.5 - 5.1 mmol/L   Chloride 104 98 - 111 mmol/L   CO2 24 22 - 32 mmol/L   Glucose, Bld 109 (H) 70 - 99 mg/dL    Comment: Glucose reference range applies only to samples taken after fasting for at least 8 hours.   BUN 14 6 - 20 mg/dL   Creatinine, Ser 0.99 0.61 - 1.24 mg/dL   Calcium 8.7 (L) 8.9 - 10.3 mg/dL   Total Protein 5.8 (L) 6.5 - 8.1 g/dL   Albumin 3.4 (L) 3.5 - 5.0 g/dL   AST 59 (H) 15 - 41 U/L   ALT 83 (H) 0 - 44 U/L   Alkaline Phosphatase 82 38 - 126 U/L   Total Bilirubin 0.4 0.3 - 1.2 mg/dL   GFR, Estimated >60 >60 mL/min    Comment: (NOTE) Calculated using the CKD-EPI Creatinine Equation (2021)    Anion gap 10 5  - 15    Comment: Performed at [facility], [place] [address], [place], [place] [address]     Current Facility-Administered Medications  Medication Dose Route Frequency Provider Last Rate Last Admin   acetaminophen (TYLENOL) tablet 650 mg  650 mg Oral Q6H PRN Nicoletta Dress, Na, MD   650 mg at 06/24/22 1643   Or   acetaminophen (TYLENOL) suppository 650 mg  650 mg Rectal Q6H PRN Nicoletta Dress, Na, MD       buprenorphine (SUBUTEX) sublingual tablet 8 mg  8 mg Sublingual TID Nicoletta Dress, Na, MD   8 mg at 06/29/22 0807   Chlorhexidine Gluconate Cloth 2 % PADS 6 each  6 each Topical Daily Nicoletta Dress, Na, MD   6 each at 06/29/22 0808   diphenhydrAMINE (BENADRYL) capsule 50 mg  50 mg Oral Q4H PRN [place] Eric J, DO   50 mg at 06/29/22 [id number]   doxycycline (VIBRAMYCIN) 50 MG capsule 50 mg  50 mg Oral Daily Irene Pap N, DO   50 mg at 06/29/22 0807   enoxaparin (LOVENOX) injection 40 mg  40 mg Subcutaneous Q24H Nicoletta Dress, Na, MD   40 mg at 06/28/22 1237   finasteride (PROSCAR) tablet 5 mg  5 mg Oral Daily [place] Donnamarie Poag, DO   5 mg at 06/29/22 [id number]   gabapentin (NEURONTIN) capsule 400 mg  400 mg Oral BID Nicoletta Dress, Na, MD   400 mg at 06/29/22 [id number]   haloperidol (HALDOL) tablet 15 mg  15 mg Oral QHS [place] Donnamarie Poag, DO       hydrOXYzine (ATARAX) tablet 25 mg  25 mg Oral BID AC & HS Li, Na, MD   25 mg at 06/29/22 [id number]   levothyroxine (SYNTHROID) tablet 150 mcg  150 mcg Oral Daily Nicoletta Dress, Na, MD   150 mcg at 06/29/22 [id number]  mirtazapine (REMERON) tablet 7.5 mg  7.5 mg Oral QHS [place] A       modafinil (PROVIGIL) tablet 300 mg  300 mg Oral Daily Katha Kuehne A   300 mg at 06/29/22 [id number]   nicotine (NICODERM CQ - dosed in mg/24 hours) patch 21 mg  21 mg Transdermal Daily Li, Na, MD   21 mg at 06/29/22 0809   pantoprazole (PROTONIX) EC tablet 40 mg  40 mg Oral Daily Li, Na, MD   40 mg at 06/29/22 [id number]   promethazine (PHENERGAN) tablet 12.5 mg  12.5 mg Oral Q6H PRN Charlann Lange, MD       senna-docusate (Senokot-S) tablet 1 tablet  1 tablet Oral  BID [place] Eric J, DO   1 tablet at 06/29/22 [id number]   sodium chloride 0.9 % bolus 1,000 mL  1,000 mL Intravenous Once Nicoletta Dress, Na, MD       tamsulosin (FLOMAX) capsule 0.8 mg  0.8 mg Oral QHS Donne Hazel, MD   0.8 mg at 06/28/22 2152   Current Outpatient Medications  Medication Sig Dispense Refill   buprenorphine (SUBUTEX) 8 MG SUBL SL tablet Place 8 mg under the tongue 3 (three) times daily.     diphenhydrAMINE (BENADRYL) 50 MG tablet Take 2-4 tablets q6h as needed for restlessness related to Haldol (Patient taking differently: Take 25 mg by mouth every 8 (eight) hours as needed (For restlessness per patient).) 100 tablet 2   gabapentin (NEURONTIN) 400 MG capsule Take 1 capsule (400 mg total) by mouth 2 (two) times daily for 180 doses. 180 capsule 0   hydrOXYzine (ATARAX) 25 MG tablet Take 25 mg by mouth in the morning and at bedtime.     levothyroxine (SYNTHROID) 150 MCG tablet Take 1 tablet (150 mcg total) by mouth daily. 30 tablet 11   nicotine (NICODERM CQ - DOSED IN MG/24 HOURS) 21 mg/24hr patch Place 21 mg onto the skin daily.     tamsulosin (FLOMAX) 0.4 MG CAPS capsule Take 1 capsule (0.4 mg total) by mouth at bedtime. 30 capsule 0   doxycycline (VIBRAMYCIN) 50 MG capsule Take 1 capsule (50 mg total) by mouth daily. 90 capsule 1   [START ON 06/30/2022] finasteride (PROSCAR) 5 MG tablet Take 1 tablet (5 mg total) by mouth daily. 30 tablet 2   haloperidol (HALDOL) 5 MG tablet Take 3 tablets (15 mg total) by mouth at bedtime. 90 tablet 2   mirtazapine (REMERON) 7.5 MG tablet Take 1 tablet (7.5 mg total) by mouth at bedtime. 30 tablet 2   [START ON 06/30/2022] modafinil (PROVIGIL) 100 MG tablet Take 3 tablets (300 mg total) by mouth daily. 90 tablet 0    Musculoskeletal:   Psychiatric Specialty Exam:  Presentation  General Appearance:  Appropriate for Environment  Eye Contact: Fair  Speech: Clear and Coherent  Speech Volume: Normal  Handedness: Right   Mood and Affect   Mood: Euthymic  Affect: Appropriate   Thought Process  Thought Processes: Coherent  Descriptions of Associations:Intact  Orientation:Full (Time, Place and Person)  Thought Content:Logical  History of Schizophrenia/Schizoaffective disorder:Yes  Duration of Psychotic Symptoms:Greater than six months  Hallucinations:Hallucinations: Auditory Description of Auditory Hallucinations: hearing whispers  Ideas of Reference:None  Suicidal Thoughts:Suicidal Thoughts: No  Homicidal Thoughts:Homicidal Thoughts: No   Sensorium  Memory: Immediate Fair; Recent Fair  Judgment: Fair  Insight: Fair   Executive Functions  Concentration: Good  Attention Span: Good  Recall: Good  Fund of Knowledge: Good  Language: Good  Psychomotor Activity  Psychomotor Activity: Psychomotor Activity: Extrapyramidal Side Effects (EPS) (suppressible l/e movement) Extrapyramidal Side Effects (EPS): Akathisia AIMS Completed?: No   Assets  Assets: Desire for Improvement   Sleep  Sleep: Sleep: Good   Physical Exam: Physical Exam HENT:     Head: Normocephalic.  Pulmonary:     Effort: Pulmonary effort is normal.  Musculoskeletal:     Comments: Tremor of b/l l/e which is suppressible when sitting (generates anxiety) and completely resolves when walking.   Neurological:     Mental Status: He is alert.    ROS Blood pressure 107/80, pulse 81, temperature (!) 97.5 F (36.4 C), temperature source Oral, resp. rate 17, height '5\' 10"'$  (1.778 m), weight 89.9 kg, SpO2 96 %. Body mass index is 28.44 kg/m.  Briefly, this pt originally presented with hallucinations and psychosis. Some concern that hallucinations were command in nature. Put under IVC for this and also for ?OD on beta-blocker. Over course of hospitalization, haloperidol was increased to 15 mg (also had only been at 10 for a short period of time) and halluciantions gradually improved. This did come at the cost of  worsening akathisia, which pt was fully educated on prior to discharge - started mirtazapine to hopefully combat this (gabapentin without much effect). Discussed importance of short followup.   Treatment Plan Summary: Daily contact with patient to assess and evaluate symptoms and progress in treatment and Medication management  Disposition: Recommend psychiatric Inpatient admission when medically cleared.  -Continues to be recommended for inpatient admission once medically cleared due to possible suicide attempt with propranolol overdose.  - haldol now at 15 mg, no changes - s mirtazapine 7.5 mg - ok to c modafanil 300 mg, no psychosis at this dose  -- potentially switch to trilafon? Would wait until abilify LAI mostly washed out  -Psychiatry to sign off in anticipation of dc.   Joycelyn Schmid A Rayan Dyal 06/29/2022 4:05 PM

## 2022-06-29 NOTE — Progress Notes (Signed)
PROGRESS NOTE    Paul Bray  [id number] DOB: 04-01-1966 DOA: 06/20/2022 PCP: Jola Baptist, PA-C    Brief Narrative:   Paul Bray is a 57 y.o. 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  Patient reported that he heard voices in his head more frequently and that day prior to admission these voices instruct him to take extra doses of his home propranolol.  He reportedly took 3 propranolol doses in order to acquiesce to these voices.  EDP discussed case with [facility] and poison control.  Poison control recommended initiation of glucagon drip and medical admission for observation.  TRH was consulted for admission for suicidal ideation with propranolol overdose.  Assessment & Plan:   Propranolol overdose Patient presenting to the ED with increased auditory hallucinations with suicidal ideations in which he took 3 doses of his propranolol.  Patient was hypotensive with BP 90/72 on admission with heart rate 63.  Patient was given IV fluid bolus and was placed on glucagon drip per recommendations of poison control per discussion with ED physician.  Glucagon drip was titrated off on 06/22/2022 and BP and heart rate has remained stable.  Schizophrenia with auditory hallucinations Suicidal ideation Patient recently admitted to inpatient psychiatry.  Continues with auditory hallucinations and with suicidal ideation with propranolol overdose as above. -- Psychiatry following, appreciate assistance -- Remains under IVC, renewed 3/4 -- Haldol 15 mg p.o. qHS -- Benadryl 50 mg p.o. every 4 hours as needed anxiety/agitation -- Continue 1:1 sitter; suicide  precautions -- Further per psychiatry, remains medically stable for discharge to inpatient psych  Hx essential hypertension Patient prescribed propranolol 20 mg p.o. daily as needed for "blood pressure".  Given his attempted overdose with blood pressure remaining stable during hospitalization, 107/69 this morning, will discontinue.  History of polysubstance abuse UDS on admission negative  Hypothyroidism TSH 3.257 on admission, within normal limits. --Levothyroxine 150 mcg p.o. daily  Acute renal failure: Resolved Creatinine 1.4 at admission.  Etiology likely prerenal azotemia in the setting of overdose as above.  Supported with IV fluid hydration with improvement of creatinine to 1.03.  Acute urinary retention, recurrent Prostatic hyperplasia Patient had Foley catheter placed on 06/17/2022 due to urinary retention in the ED.  Renal ultrasound 06/17/2022 negative for hydronephrosis.  Case was discussed with urology, Dr. Gloriann Loan by previous hospitalist who recommended voiding trial and Foley catheter was removed on 2/28.  Patient with decreased urinary output and urinary retention noted on bladder scan up to 1 L.  Attempts at Foley catheter placement by RN unsuccessful and urology was consulted and patient underwent coud catheter placement by Dr. Abner Greenspan on 06/25/2022. -- Tamsulosin 0.8 mg p.o. daily -- Finasteride 5 mg p.o. daily -- Outpatient follow-up with urology for void trial, likely will need BPH evaluation with TRUS for size, cystoscopy, uroflow and ultimately likely bladder obstruction procedure  Tobacco use disorder Counseled on need for abstinence/cessation -- Nicotine patch    DVT prophylaxis: enoxaparin (LOVENOX) injection 40 mg Start: 06/21/22 1300 SCDs Start: 06/21/22 [id number]    Code Status: Full Code Family Communication: No family present at bedside this morning  Disposition Plan:  Level of care: Med-Surg Status is: Inpatient Remains inpatient appropriate because: Under IVC,  medically stable for discharge inpatient psych  although having Foley catheter inhibits this process per [facility], further per psychiatry    Consultants:  Psychiatry Urology, Dr. Abner Greenspan  Procedures:  Foley catheter replacement 06/25/2022  Antimicrobials:  None   Subjective: Patient seen examined bedside, lying in bed.  Sitter present. Under IVC.  Denies auditory hallucinations today, also continues to deny suicidal ideations.  Seen by psychiatry yesterday with recommendation of increasing Haldol to 15 mg nightly.  No other complaints or concerns at this time.  Denies headache, no chest pain, no shortness of breath, no abdominal pain.  Medically stable for discharge once inpatient psych bed obtained.  No acute concerns overnight per nursing staff.   Objective: Vitals:   06/28/22 1905 06/29/22 0500 06/29/22 0604 06/29/22 0720  BP: 99/63  98/67 107/80  Pulse: 65  60 81  Resp: '19  19 17  '$ Temp:   (!) 97.5 F (36.4 C) (!) 97.5 F (36.4 C)  TempSrc:   Oral Oral  SpO2: 97%  95% 96%  Weight:  89.9 kg    Height:       No intake or output data in the 24 hours ending 06/29/22 1031  Filed Weights   06/23/22 0500 06/24/22 0500 06/29/22 0500  Weight: 96.4 kg 94.7 kg 89.9 kg    Examination:  Physical Exam: GEN: NAD, alert and oriented x 3, chronically ill appearance, appears older than stated age HEENT: NCAT, PERRL, EOMI, sclera clear, MMM PULM: CTAB w/o wheezes/crackles, normal respiratory effort, on room air  CV: RRR w/o M/G/R GI: abd soft, NTND, + BS GU: Foley catheter noted in place draining clear yellow urine in collection bag MSK: no peripheral edema, moves all extremities independently NEURO: No focal deficits PSYCH: Denies auditory/visual hallucination, denies SI/HI Integumentary: dry/intact, no rashes or wounds    Data Reviewed: I have personally reviewed following labs and imaging studies  CBC: Recent Labs  Lab 06/28/22 1755  WBC 3.6*  NEUTROABS 1.7  HGB 12.8*  HCT  37.7*  MCV 93.5  PLT [id number]   Basic Metabolic Panel: Recent Labs  Lab 06/28/22 1755  NA 138  K 4.4  CL 104  CO2 24  GLUCOSE 109*  BUN 14  CREATININE 0.99  CALCIUM 8.7*   GFR: Estimated Creatinine Clearance: 96.3 mL/min (by C-G formula based on SCr of 0.99 mg/dL). Liver Function Tests: Recent Labs  Lab 06/28/22 1755  AST 59*  ALT 83*  ALKPHOS 82  BILITOT 0.4  PROT 5.8*  ALBUMIN 3.4*    No results for input(s): "LIPASE", "AMYLASE" in the last 168 hours. No results for input(s): "AMMONIA" in the last 168 hours. Coagulation Profile: No results for input(s): "INR", "PROTIME" in the last 168 hours.  Cardiac Enzymes: No results for input(s): "CKTOTAL", "CKMB", "CKMBINDEX", "TROPONINI" in the last 168 hours. BNP (last 3 results) No results for input(s): "PROBNP" in the last 8760 hours. HbA1C: No results for input(s): "HGBA1C" in the last 72 hours. CBG: No results for input(s): "GLUCAP" in the last 168 hours. Lipid Profile: No results for input(s): "CHOL", "HDL", "LDLCALC", "TRIG", "CHOLHDL", "LDLDIRECT" in the last 72 hours. Thyroid Function Tests: No results for input(s): "TSH", "T4TOTAL", "FREET4", "T3FREE", "THYROIDAB" in the last 72 hours.  Anemia Panel: No results for input(s): "VITAMINB12", "FOLATE", "FERRITIN", "TIBC", "IRON", "RETICCTPCT" in the last 72 hours. Sepsis Labs: No results for input(s): "PROCALCITON", "LATICACIDVEN" in the last 168 hours.   Recent Results (from the past 240 hour(s))  Blood Culture (routine single)     Status: None   Collection  Time: 06/20/22 11:51 PM   Specimen: BLOOD LEFT HAND  Result Value Ref Range Status   Specimen Description BLOOD LEFT HAND  Final   Special Requests   Final    BOTTLES DRAWN AEROBIC ONLY Blood Culture results may not be optimal due to an inadequate volume of blood received in culture bottles   Culture   Final    NO GROWTH 5 DAYS Performed at [facility], [place] [address] [place], [place] [address]     Report Status 06/26/2022 FINAL  Final  MRSA Next Gen by PCR, Nasal     Status: None   Collection Time: 06/21/22  6:27 PM   Specimen: Nasal Mucosa; Nasal Swab  Result Value Ref Range Status   MRSA by PCR Next Gen NOT DETECTED NOT DETECTED Final    Comment: (NOTE) The GeneXpert MRSA Assay (FDA approved for NASAL specimens only), is one component of a comprehensive MRSA colonization surveillance program. It is not intended to diagnose MRSA infection nor to guide or monitor treatment for MRSA infections. Test performance is not FDA approved in patients less than 17 years old. Performed at [facility], [place] [address] [place],  [address]          Radiology Studies: No results found.      Scheduled Meds:  buprenorphine  8 mg Sublingual TID   Chlorhexidine Gluconate Cloth  6 each Topical Daily   doxycycline  50 mg Oral Daily   enoxaparin (LOVENOX) injection  40 mg Subcutaneous Q24H   finasteride  5 mg Oral Daily   gabapentin  400 mg Oral BID   haloperidol  15 mg Oral QHS   hydrOXYzine  25 mg Oral BID AC & HS   levothyroxine  150 mcg Oral Daily   modafinil  300 mg Oral Daily   nicotine  21 mg Transdermal Daily   pantoprazole  40 mg Oral Daily   senna-docusate  1 tablet Oral BID   tamsulosin  0.8 mg Oral QHS   Continuous Infusions:  sodium chloride       LOS: 8 days    Time spent: 48 minutes spent on chart review, discussion with nursing staff, consultants, updating family and interview/physical exam; more than 50% of that time was spent in counseling and/or coordination of care.    Shamekia Tippets J [place] DO [facility] Available via Epic secure chat 7am-7pm After these hours, please refer to coverage provider listed on [email or web address] 06/29/2022, 10:31 AM

## 2022-06-29 NOTE — Progress Notes (Signed)
RN gave patient DC instructions and his mother was present as well. Pt did not have an IV in, Medications escribed to his home pharmacy for pick up. His foley has been emptied and is working fine prior to DC, Therapist, sports also gave pt a leg bag to exchange out.

## 2022-06-29 NOTE — TOC Progression Note (Addendum)
Transition of Care [facility]) - Progression Note    Patient Details  Name: Paul Bray MRN: [id number] Date of Birth: January 11, 1966  Transition of [facility]) CM/SW [place], [place] Phone Number: 06/29/2022, 10:09 AM  Clinical Narrative:    CSW completed IVC process and requested [employer] serve the paperwork. Barriers to inpatient psych placement continue to be the Foley Catheter. TOC will continue to follow for DC needs.     11:25 CSW advised pt is cleared from psych and IVC can be rescinded. Paperwork signed by Dr. [place] faxed in to the magistrate. Change of commitment completed.     Expected Discharge Plan and Services                                               Social Determinants of Health (SDOH) Interventions SDOH Screenings   Alcohol Screen: Low Risk  (10/02/2021)  Tobacco Use: High Risk (06/25/2022)    Readmission Risk Interventions     No data to display

## 2022-07-01 ENCOUNTER — Other Ambulatory Visit (HOSPITAL_COMMUNITY): Payer: Self-pay | Admitting: Medical

## 2022-07-02 ENCOUNTER — Encounter (HOSPITAL_COMMUNITY): Payer: Self-pay | Admitting: Medical

## 2022-07-02 ENCOUNTER — Telehealth (HOSPITAL_BASED_OUTPATIENT_CLINIC_OR_DEPARTMENT_OTHER): Payer: Medicare Other | Admitting: Medical

## 2022-07-02 DIAGNOSIS — D320 Benign neoplasm of cerebral meninges: Secondary | ICD-10-CM

## 2022-07-02 DIAGNOSIS — Z923 Personal history of irradiation: Secondary | ICD-10-CM

## 2022-07-02 DIAGNOSIS — G4730 Sleep apnea, unspecified: Secondary | ICD-10-CM

## 2022-07-02 DIAGNOSIS — L719 Rosacea, unspecified: Secondary | ICD-10-CM

## 2022-07-02 DIAGNOSIS — F060 Psychotic disorder with hallucinations due to known physiological condition: Secondary | ICD-10-CM | POA: Diagnosis not present

## 2022-07-02 DIAGNOSIS — N138 Other obstructive and reflux uropathy: Secondary | ICD-10-CM

## 2022-07-02 DIAGNOSIS — G2571 Drug induced akathisia: Secondary | ICD-10-CM

## 2022-07-02 DIAGNOSIS — I6381 Other cerebral infarction due to occlusion or stenosis of small artery: Secondary | ICD-10-CM

## 2022-07-02 DIAGNOSIS — Z79899 Other long term (current) drug therapy: Secondary | ICD-10-CM

## 2022-07-02 DIAGNOSIS — Z91148 Patient's other noncompliance with medication regimen for other reason: Secondary | ICD-10-CM | POA: Diagnosis not present

## 2022-07-02 DIAGNOSIS — N401 Enlarged prostate with lower urinary tract symptoms: Secondary | ICD-10-CM

## 2022-07-02 DIAGNOSIS — G4710 Hypersomnia, unspecified: Secondary | ICD-10-CM

## 2022-07-02 DIAGNOSIS — E039 Hypothyroidism, unspecified: Secondary | ICD-10-CM

## 2022-07-02 DIAGNOSIS — G250 Essential tremor: Secondary | ICD-10-CM

## 2022-07-02 DIAGNOSIS — I10 Essential (primary) hypertension: Secondary | ICD-10-CM

## 2022-07-02 DIAGNOSIS — F1920 Other psychoactive substance dependence, uncomplicated: Secondary | ICD-10-CM | POA: Diagnosis not present

## 2022-07-02 MED ORDER — DOXYCYCLINE HYCLATE 50 MG PO CAPS: 50.0000 mg | ORAL_CAPSULE | Freq: Every day | ORAL | 1 refills | Status: DC

## 2022-07-02 NOTE — Progress Notes (Signed)
Virtual Visit via Video Note  I connected with Paul Bray on 07/02/22 at  4:00 PM EST by a video enabled telemedicine application and verified that I am speaking with the correct person using two identifiers.  Location: Patient: At Home s/p Discharge Provider: [place]   I discussed the limitations of evaluation and management by telemedicine and the availability of in person appointments. The patient expressed understanding and agreed to proceed.   History of Present Illness:See EPIC note    Observations/Objective:See EPIC note   Assessment and Plan:See EPIC note   Follow Up Instructions:See EPIC note   I discussed the assessment and treatment plan with the patient. The patient was provided an opportunity to ask questions and all were answered. The patient agreed with the plan and demonstrated an understanding of the instructions.   The patient was advised to call back or seek an in-person evaluation if the symptoms worsen or if the condition fails to improve as anticipated.  I provided 20 minutes of non-face-to-face time during this encounter.   Paul Bray, Hershal Coria  [facility] MD/PA/NP OP Progress Note  07/02/2022 4:38 PM Paul Bray  MRN:  [id number]  Chief Complaint:  Chief Complaint  Patient presents with   Follow-up   Hallucinations   Agitation   Addiction Problem   Anxiety   Sleep Disorder   BPH with obstruction   Rosacea   Medication Problem   HPI: Paul Bray is seen post discharge having cleared his Psychosis at 15 mg of Haldol. His Paul Bray is felt to have voluntary component . He tells me he can manage with Benadryl Hospital gave him Remeron nightly. He is seeking Modanafil. Thyroid has been switched without further Thyroid studies (he has bad veins). Wants another sleep study. Needs refill on Doxycycline.  Visit Diagnosis:    ICD-10-CM   1. 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 Multiple lacunar infarcts  (HCC)  I63.81     3. Polysubstance dependence ([place])  F19.20     4. Noncompliance with medication treatment due to abuse of medication  Z91.148     5. Benign localized hyperplasia of prostate with urinary obstruction  N40.1    N13.8     6. Meningioma, cerebral ([place])  D32.0     7. Status post gamma knife treatment  Z92.3     8. Familial tremor  G25.0     9. Akathisia  G25.71     10. Hypersomnia with sleep apnea  G47.10    G47.30     11. Rosacea  L71.9     12. Essential hypertension  I10     13. Acquired hypothyroidism  E03.9     14. Medication management  Z79.899       Past Psychiatric History: Care Everywhere reviewed-No New Info 03/26/2022 but old records from [facility] are now present and copied below for his record [place] Date of recent admission: 10/29/21 Date of recent discharge: 11/01/21 [facility] [place] [facility] [place] 6/17/-03/04/2020  Pt with Opiate dependence MAT Suboxone at [facility] Dr Lynnda Shields C/O of paranoia he is aware of but cant stop/completely control.Has rx for Risperdal from [employer]. Also hx of Gamma Knife tradiation fo benign brain tumor ?2018 Says has records Requesting Psychiatrist/medication mangement for paranoia hx [place] [facility] [place] 08/21/2021 - NOW [facility] INPATIENT  Date of Admission:  10/02/2021 Date of Discharge: 10/13/2021   [place] Alcohol intoxication 03/26/2017  Alcohol withdrawal delirium 03/26/2017  Chronic daily headache 02/25/2017  Alcohol use disorder, severe, dependence 02/22/2017  Heroin dependence MAT Methadone [place]/Subutex TBR 01/2018  Anxiety disorder, unspecified 01/20/2017  Paranoia 03/2018    [place], Stella O, NP - 04/12/2017 12:38 PM EST Formatting of this note might be different from the original. [facility] Psychiatry - Substance Abuse PHP/IOP Intake Assessment  BH Formulate an individual and appropriate relapse prevention plan.   [place] SU Problem:  Formation of a relapse prevention plan    [facility] Uncomplicated opioid dependence (CMS/HCC) 12/24/2017  1. Discharge from hospital. 2. Follow up at [facility]    Past Medical History:  Care Everywhere reviewed-No New Info    Past Medical History:  Diagnosis Date   ADHD    Anxiety    Brain tumor ([place])    balance and occ memory issues and headaches   Brain tumor ([place])    sees [facility] q year   Depression    Headache    migraine and tension   Hypothyroidism    Soft tissue mass    left shoulder   Substance abuse ([place])     Past Surgical History:  Procedure Laterality Date   colonscopy     gamma knife radiation 2018 for brain tumor'     hematoma removed from arm Left    MASS EXCISION Left 06/30/2019   Procedure: EXCISION OF SOFT TISSUE  MASS LEFT SHOULDER;  Surgeon: Armandina Gemma, MD;  Location: [place];  Service: General;  Laterality: Left;  LMA    Family Psychiatric History: Alcoholism and drug abuse on father's side    Family History:  Hypertension Mother   Hypertension/Alcoholism/Addiction Father   Social History:  Social History   Socioeconomic History    Marital status: Single      Spouse name: Not on file   Number of children: NA   Years of education: Not on file   Highest education level: Not on file  Occupational History   Computer Programmer until 2016  Tobacco Use   Smoking status: Former      Types: Cigarettes   Smokeless tobacco: Current Vape   Tobacco comments:      quit cigarettes jan 2020  Vaping Use   Vaping Use: Every day  Substance and Sexual Activity   Alcohol use: Not Currently      Comment: quit Oct 2019   Drug use: Not Currently      Types: Cocaine, IV, Heroin, MDMA (Ecstacy)      Comment: last reported use; cocaine and heroin in 2019; MDMA Feb 2023   Sexual activity: Not Currently  Other Topics Concern   Not on file  Social History Narrative    Living with mother now       Social Determinants of Health     Difficulty of Paying Living Expenses: Disability  Food Insecurity:    Worried About Charity fundraiser in the Last Year: On disability   Arboriculturist in the Last Year:   Transportation Needs:    Film/video editor (Medical): Uses Ecologist (Non-Medical):   Physical Activity:    Days of Exercise per Week:    Minutes of Exercise per Session:   Stress:    Feeling of Stress : Constant   Social Connections:    Frequency of Communication with Friends and Family: Mother recently kicked him out of house   Frequency  of Social Gatherings with Friends and Family: Lives alone-   Attends Religious Services: No   Marine scientist or Organizations: Restarting active with AA /NA      Social Connections: Mother/Computer/Had started AA until breakdown    Allergies:  Allergies  Allergen Reactions   Ingrezza [Valbenazine Tosylate] Other (See Comments)    Severe tongue chewing at 80 mg Tolerates lower doses   Naloxone Palpitations    Pt report MAT Rx is Subutex/Buprenorphine only   Amphetamine-Dextroamphetamine Other (See Comments)    unknown    Metabolic Disorder Labs: Lab Results  Component Value Date   HGBA1C 5.0 10/03/2021   MPG 96.8 10/03/2021   No results found for: "PROLACTIN" Lab Results  Component Value Date   CHOL 191 10/03/2021   TRIG 92 10/03/2021   HDL 48 10/03/2021   CHOLHDL 4.0 10/03/2021   VLDL 18 10/03/2021   LDLCALC 125 (H) 10/03/2021   Lab Results  Component Value Date   TSH 3.257 06/22/2022   TSH 1.500 02/04/2022    Therapeutic Level Labs: No results found for: "LITHIUM" No results found for: "VALPROATE" No results found for: "CBMZ"  Current Medications: Current Outpatient Medications  Medication Sig Dispense Refill   buprenorphine (SUBUTEX) 8 MG SUBL SL tablet Place 8 mg under the tongue 3 (three) times daily.     diphenhydrAMINE (BENADRYL) 50 MG tablet Take 2-4 tablets q6h as  needed for restlessness related to Haldol (Patient taking differently: Take 25 mg by mouth every 8 (eight) hours as needed (For restlessness per patient).) 100 tablet 2   doxycycline (VIBRAMYCIN) 50 MG capsule Take 1 capsule (50 mg total) by mouth daily. 90 capsule 1   finasteride (PROSCAR) 5 MG tablet Take 1 tablet (5 mg total) by mouth daily. 30 tablet 2   Fluocinolone Acetonide 0.01 % OIL      gabapentin (NEURONTIN) 400 MG capsule Take 1 capsule (400 mg total) by mouth 2 (two) times daily for 180 doses. 180 capsule 0   haloperidol (HALDOL) 5 MG tablet Take 3 tablets (15 mg total) by mouth at bedtime. 90 tablet 2   hydrocortisone 2.5 % lotion Apply topically.     hydrOXYzine (ATARAX) 25 MG tablet Take 25 mg by mouth in the morning and at bedtime.     levothyroxine (SYNTHROID) 150 MCG tablet Take 1 tablet (150 mcg total) by mouth daily. 30 tablet 11   metroNIDAZOLE (METROGEL) 0.75 % gel      mirtazapine (REMERON) 7.5 MG tablet Take 1 tablet (7.5 mg total) by mouth at bedtime. 30 tablet 2   nicotine (NICODERM CQ - DOSED IN MG/24 HOURS) 21 mg/24hr patch Place 21 mg onto the skin daily.     tamsulosin (FLOMAX) 0.4 MG CAPS capsule Take by mouth.     No current facility-administered medications for this visit.    Musculoskeletal: Strength & Muscle Tone: Telepsych visit-Grossly normal Musculoskeletal and cranial nerve inspections Gait & Station: NA Patient leans: N/A  Psychiatric Specialty Exam: Review of Systems  Constitutional:  Positive for activity change. Negative for appetite change, chills, diaphoresis, fatigue, fever and unexpected weight change.  HENT: Negative.    Eyes: Negative.   Respiratory: Negative.    Cardiovascular:  Negative for chest pain, palpitations and leg swelling.       Hypertension  Gastrointestinal: Negative.   Endocrine: Negative for cold intolerance, heat intolerance, polydipsia, polyphagia and polyuria.       Hypothyroid  Genitourinary:  Positive for  difficulty urinating (BPH with  obstruction has Cath in). Negative for dysuria, enuresis, flank pain, frequency, genital sores and hematuria.  Musculoskeletal: Negative.   Skin:  Positive for color change and rash. Negative for pallor and wound.       ROSACEA  Allergic/Immunologic: Negative for environmental allergies, food allergies and immunocompromised state.  Neurological:  Positive for tremors. Negative for dizziness, seizures, syncope, facial asymmetry, speech difficulty, weakness, light-headedness, numbness and headaches.  Hematological:  Negative for adenopathy. Does not bruise/bleed easily.  Psychiatric/Behavioral:  Positive for dysphoric mood. Negative for agitation, behavioral problems, confusion, decreased concentration, hallucinations, self-injury, sleep disturbance and suicidal ideas. The patient is nervous/anxious. The patient is not hyperactive.     There were no vitals taken for this visit.There is no height or weight on file to calculate BMI.MY CHART VISIT  General Appearance: Casual  Eye Contact:  Good  Speech:  Clear and Coherent  Volume:  Normal  Mood:  Anxious  Affect:  Appropriate and Congruent  Thought Process:  Coherent, Goal Directed, and Descriptions of Associations: Intact  Orientation:  Full (Time, Place, and Person)  Thought Content: WDL   Suicidal Thoughts:  No  Homicidal Thoughts:  No  Memory:   Affected by SUD/Stroke/Psychosis  Judgement:  Impaired  Insight:  Fair  Psychomotor Activity:  Increased  Concentration:  Concentration: Good and Attention Span: Good  Recall:   See memory  Fund of Knowledge:  WDL  Language: Good  Akathisia:   See HPI   Handed:  Right  AIMS (if indicated): not done  Assets:  Desire for Improvement Financial Resources/Insurance Housing Leisure Time Physical Health Resilience Social Support Talents/Skills Transportation Vocational/Educational  ADL's:  Intact  Cognition: Impaired,  Moderate  Sleep:   Hypersomnia history  ? Etiology   Screenings: [place] Office Visit from 11/27/2021 in [facility] Visit from 11/06/2021 in [facility] Visit from 10/23/2021 in [facility] Office Visit from 10/16/2021 in [facility] Admission (Discharged) from 10/02/2021 in [facility]  AIMS Total Score '4 7 5 21 '$ 0      Flowsheet Row ED to Hosp-Admission (Discharged) from 06/20/2022 in [place] ED from 06/13/2022 in [facility] Emergency Department at [facility] ED from 05/25/2022 in [facility] Emergency Department at [place] No Risk No Risk        Assessment  Psychosis cleared  Believe has an addiction to Modanafil Requests sleep study Needs refill of Doxycycline   and Plan:  Request Sleep study Refill Doxy FU 1 week    Paul Russian, PA-C 07/02/2022, 4:38 PM

## 2022-07-09 ENCOUNTER — Other Ambulatory Visit (HOSPITAL_COMMUNITY): Payer: Self-pay | Admitting: *Deleted

## 2022-07-09 ENCOUNTER — Telehealth (HOSPITAL_BASED_OUTPATIENT_CLINIC_OR_DEPARTMENT_OTHER): Payer: Medicare Other | Admitting: Medical

## 2022-07-09 DIAGNOSIS — G4710 Hypersomnia, unspecified: Secondary | ICD-10-CM

## 2022-07-09 DIAGNOSIS — F1920 Other psychoactive substance dependence, uncomplicated: Secondary | ICD-10-CM

## 2022-07-09 DIAGNOSIS — I10 Essential (primary) hypertension: Secondary | ICD-10-CM

## 2022-07-09 DIAGNOSIS — L719 Rosacea, unspecified: Secondary | ICD-10-CM

## 2022-07-09 DIAGNOSIS — G4730 Sleep apnea, unspecified: Secondary | ICD-10-CM

## 2022-07-09 DIAGNOSIS — I6381 Other cerebral infarction due to occlusion or stenosis of small artery: Secondary | ICD-10-CM

## 2022-07-09 DIAGNOSIS — F060 Psychotic disorder with hallucinations due to known physiological condition: Secondary | ICD-10-CM | POA: Diagnosis not present

## 2022-07-09 DIAGNOSIS — N401 Enlarged prostate with lower urinary tract symptoms: Secondary | ICD-10-CM

## 2022-07-09 DIAGNOSIS — D320 Benign neoplasm of cerebral meninges: Secondary | ICD-10-CM

## 2022-07-09 DIAGNOSIS — G250 Essential tremor: Secondary | ICD-10-CM

## 2022-07-09 DIAGNOSIS — Z79899 Other long term (current) drug therapy: Secondary | ICD-10-CM

## 2022-07-09 DIAGNOSIS — N138 Other obstructive and reflux uropathy: Secondary | ICD-10-CM

## 2022-07-09 DIAGNOSIS — Z923 Personal history of irradiation: Secondary | ICD-10-CM

## 2022-07-09 DIAGNOSIS — E039 Hypothyroidism, unspecified: Secondary | ICD-10-CM

## 2022-07-09 MED ORDER — DOXYCYCLINE HYCLATE 50 MG PO CAPS: 50.0000 mg | ORAL_CAPSULE | Freq: Every day | ORAL | 1 refills | Status: DC

## 2022-07-09 NOTE — Progress Notes (Signed)
Trent Woods MD/PA/NP OP Progress Note  07/09/2022 3:41 PM KHORY MAZZARESE  MRN:  [id number] Virtual Visit via Video Note  I connected with [name] on 07/09/22 at  3:00 PM EDT by a video enabled telemedicine application and verified that I am speaking with the correct person using two identifiers.  Location: Patient: at home Provider: [facility]   I discussed the limitations of evaluation and management by telemedicine and the availability of in person appointments. The patient expressed understanding and agreed to proceed.   History of Present Illness:See EPIC note    Observations/Objective:See EPIC note   Assessment and Plan:See EPIC note   Follow Up Instructions:See EPIC note   I discussed the assessment and treatment plan with the patient. The patient was provided an opportunity to ask questions and all were answered. The patient agreed with the plan and demonstrated an understanding of the instructions.   The patient was advised to call back or seek an in-person evaluation if the symptoms worsen or if the condition fails to improve as anticipated.  I provided 20 minutes of non-face-to-face time during this encounter.   [name], PA-C   Chief Complaint:  Chief Complaint  Patient presents with   Follow-up   Addiction Problem   Hallucinations   Medication Refill   Hypersomnia   Hypothyroidism   HPI: Weekly FU on Psychosis 2 weeks s/p discharge on 15 mg of Haldol. Continues hallucination free. No complaints other than his Doxycycline refill did not go thru and ongoing sleep issues.Asks about taper of Haldol.Wants Thyroid checked as it was not done while inpatient. Medications are in lock box now. He has not had any recent sleep study in years,the last being in [place].Marland Kitchenand wants a new one which may help sort out his sleep pattern.Claims he needs high dose of Modafanil. No reports of conflict at [facility]. Care Everywhere reviewed. Urology consult  coming up.  Visit Diagnosis:    ICD-10-CM   1. 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 Multiple lacunar infarcts (HCC)  I63.81     3. Polysubstance dependence ([place])  F19.20     4. Benign localized hyperplasia of prostate with urinary obstruction  N40.1    N13.8     5. Meningioma, cerebral ([place])  D32.0     6. Status post gamma knife treatment  Z92.3     7. Familial tremor  G25.0     8. Hypersomnia with sleep apnea  G47.10    G47.30     9. Rosacea  L71.9     10. Essential hypertension  I10     11. Acquired hypothyroidism  E03.9     12. Medication management  Z79.899      Past Psychiatric History: Care Everywhere reviewed-No New Info 03/26/2022 but old records from [facility] are now present and copied below for his record [place] Date of recent admission: 10/29/21 Date of recent discharge: 11/01/21 [facility] [place] [facility] [place] 6/17/-03/04/2020  Pt with Opiate dependence MAT Suboxone at [facility] Lynnda Shields C/O of paranoia he is aware of but cant stop/completely control.Has rx for Risperdal from [employer]. Also hx of Gamma Knife tradiation fo benign brain tumor ?2018 Says has records Requesting Psychiatrist/medication mangement for paranoia hx [place] [facility] [place] 08/21/2021 - NOW [facility] INPATIENT  Date of Admission:  10/02/2021 Date of Discharge: 10/13/2021  Date of Admission:06/13/2022 [facility] ED)  Date  of Discharge:06/16/22  Date of Admission: 06/25/2022  Date of Discharge:06/29/2022  [place] 05/27/2022- 2/ [facility] Alcohol intoxication 03/26/2017  Alcohol withdrawal delirium 03/26/2017  Chronic daily headache 02/25/2017  Alcohol use disorder, severe, dependence 02/22/2017  Heroin dependence MAT Methadone Metro/Subutex TBR 01/2018  Anxiety disorder, unspecified 01/20/2017  Paranoia 03/2018    [place], Stella O, NP - 04/12/2017 12:38 PM EST Formatting of this  note might be different from the original. [facility] Psychiatry - Substance Abuse PHP/IOP Intake Assessment  BH Formulate an individual and appropriate relapse prevention plan.   [place] SU Problem: Formation of a relapse prevention plan    [facility] Uncomplicated opioid dependence (CMS/HCC) 12/24/2017  1. Discharge from hospital. 2. Follow up at [facility]    Past Surgical History:  Procedure Laterality Date   colonscopy     gamma knife radiation 2018 for brain tumor'     hematoma removed from arm Left    MASS EXCISION Left 06/30/2019   Procedure: EXCISION OF SOFT TISSUE  MASS LEFT SHOULDER;  Surgeon: Armandina Gemma, MD;  Location: [place];  Service: General;  Laterality: Left;  LMA    Family Psychiatric History:   Family History: No family history on file.  Social History:  Social History   Expand All Collapse All  Virtual Visit via Video Note   I connected with NIKLAS FAIRFIELD on 07/02/22 at  4:00 PM EST by a video enabled telemedicine application and verified that I am speaking with the correct person using two identifiers.   Location: Patient: At Home s/p Discharge Provider: [place]   I discussed the limitations of evaluation and management by telemedicine and the availability of in person appointments. The patient expressed understanding and agreed to proceed.     History of Present Illness:See EPIC note     Observations/Objective:See EPIC note     Assessment and Plan:See EPIC note     Follow Up Instructions:See EPIC note   I discussed the assessment and treatment plan with the patient. The patient was provided an opportunity to ask questions and all were answered. The patient agreed with the plan and demonstrated an understanding of the instructions.   The patient was advised to call back or seek an in-person evaluation if the symptoms worsen or if the condition fails to improve as anticipated.   I provided  20 minutes of non-face-to-face time during this encounter.     [name], Hershal Coria   [facility] MD/PA/NP OP Progress Note   07/02/2022 4:38 PM MACKINNON GENOVA  MRN:  [id number]   Chief Complaint:     Chief Complaint  Patient presents with   Follow-up   Hallucinations   Agitation   Addiction Problem   Anxiety   Sleep Disorder   BPH with obstruction   Rosacea   Medication Problem    HPI: [name] is seen post discharge having cleared his Psychosis at 15 mg of Haldol. His Karena Addison is felt to have voluntary component . He tells me he can manage with [facility] gave him Remeron nightly. He is seeking Modanafil. Thyroid has been switched without further Thyroid studies (he has bad veins). Wants another sleep study. Needs refill on Doxycycline.   Visit Diagnosis:      ICD-10-CM    1. 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 Multiple lacunar infarcts (HCC)  I63.81  3. Polysubstance dependence ([place])  F19.20       4. Noncompliance with medication treatment due to abuse of medication  Z91.148       5. Benign localized hyperplasia of prostate with urinary obstruction  N40.1      N13.8       6. Meningioma, cerebral ([place])  D32.0       7. Status post gamma knife treatment  Z92.3       8. Familial tremor  G25.0       9. Akathisia  G25.71       10. Hypersomnia with sleep apnea  G47.10      G47.30       11. Rosacea  L71.9       12. Essential hypertension  I10       13. Acquired hypothyroidism  E03.9       14. Medication management  Z79.899           Past Psychiatric History: Care Everywhere reviewed-No New Info 03/26/2022 but old records from [facility] are now present and copied below for his record [place] Date of recent admission: 10/29/21 Date of recent discharge: 11/01/21 [facility] [place] [facility] [place] 6/17/-03/04/2020  Pt with Opiate dependence MAT Suboxone at [facility]  Lynnda Shields C/O of paranoia he is aware of but cant stop/completely control.Has rx for Risperdal from [employer]. Also hx of Gamma Knife tradiation fo benign brain tumor ?2018 Says has records Requesting Psychiatrist/medication mangement for paranoia hx [place] [facility] [place] 08/21/2021 - NOW [facility] INPATIENT  Date of Admission:  10/02/2021 Date of Discharge: 10/13/2021   [place] Alcohol intoxication 03/26/2017  Alcohol withdrawal delirium 03/26/2017  Chronic daily headache 02/25/2017  Alcohol use disorder, severe, dependence 02/22/2017  Heroin dependence MAT Methadone Metro/Subutex TBR 01/2018  Anxiety disorder, unspecified 01/20/2017  Paranoia 03/2018    [facility] Unk Pinto, NP - 04/12/2017 12:38 PM EST Formatting of this note might be different from the original. [facility] Psychiatry - Substance Abuse PHP/IOP Intake Assessment  BH Formulate an individual and appropriate relapse prevention plan.   [place] SU Problem: Formation of a relapse prevention plan    [facility] Uncomplicated opioid dependence (CMS/HCC) 12/24/2017  1. Discharge from hospital. 2. Follow up at [facility]      Past Medical History:  Care Everywhere reviewed-No New Info        Past Medical History:  Diagnosis Date   ADHD     Anxiety     Brain tumor ([place])      balance and occ memory issues and headaches   Brain tumor ([place])      sees [facility] q year   Depression     Headache      migraine and tension   Hypothyroidism     Soft tissue mass      left shoulder   Substance abuse ([place])           Past Surgical History:  Procedure Laterality Date   colonscopy       gamma knife radiation 2018 for brain tumor'       hematoma removed from arm Left     MASS EXCISION Left 06/30/2019    Procedure: EXCISION OF SOFT TISSUE  MASS LEFT SHOULDER;  Surgeon: Armandina Gemma, MD;  Location: [place];  Service: General;  Laterality: Left;  LMA      Family  Psychiatric History: Alcoholism and drug abuse  on father's side     Family History:  Hypertension Mother   Hypertension/Alcoholism/Addiction Father    Social History:  Social History    Socioeconomic History          Marital status: Single      Spouse name: Not on file   Number of children: NA   Years of education: Not on file   Highest education level: Not on file  Occupational History   Computer Programmer until 2016  Tobacco Use   Smoking status: Former      Types: Cigarettes   Smokeless tobacco: Current Vape   Tobacco comments:      quit cigarettes jan 2020  Vaping Use   Vaping Use: Every day  Substance and Sexual Activity   Alcohol use: Not Currently      Comment: quit Oct 2019   Drug use: Not Currently      Types: Cocaine, IV, Heroin, MDMA (Ecstacy)      Comment: last reported use; cocaine and heroin in 2019; MDMA Feb 2023   Sexual activity: Not Currently  Other Topics Concern   Recent hospitalization  Admit date: 06/20/2022 Discharge date: 06/29/2022  Social History Narrative    Living with mother now      Social Determinants of Health     Difficulty of Paying Living Expenses: Disability  Food Insecurity:    Worried About Charity fundraiser in the Last Year: On disability   Arboriculturist in the Last Year: None reported  Transportation Needs:    Film/video editor (Medical): relies on mother   Lack of Transportation (Non-Medical)relies on mother  Physical Activity:    Days of Exercise per Week: Claims to walk daily with dog   Minutes of Exercise per Session:   Stress:    Feeling of Stress : Constant c/o hypersomnia /fatigue and dpendence on high dose Modafanil ("My life would be unbearable without it")  Social Connections:    Frequency of Communication with Friends and Family: Mother recently brought him back to house as he was unable to live in [address] unsupervised due to abusing medication and starting to use ectasy which brought on  uncontrollable psychosis Unfortunately priorto this most recent hospitalization he went into mother's room and abused Modafanil and Suboxonre precipitating another psychotic episode requiring hospitalization   Frequency of Social Gatherings with Friends and Family: Lives with mother   Attends Religious Services: No   Active Member of Clubs or Organizations: Restarting active with AA /NA        Social Connections: Mother/Computer/Had started AA until breakdown           Allergies:  Allergies  Allergen Reactions   Ingrezza [Valbenazine Tosylate] Other (See Comments)    Severe tongue chewing at 80 mg Tolerates lower doses   Naloxone Palpitations    Pt report MAT Rx is Subutex/Buprenorphine only   Amphetamine-Dextroamphetamine Other (See Comments)    unknown    Metabolic Disorder Labs: Lab Results  Component Value Date   HGBA1C 5.0 10/03/2021   MPG 96.8 10/03/2021   No results found for: "PROLACTIN" Lab Results  Component Value Date   CHOL 191 10/03/2021   TRIG 92 10/03/2021   HDL 48 10/03/2021   CHOLHDL 4.0 10/03/2021   VLDL 18 10/03/2021   LDLCALC 125 (H) 10/03/2021   Lab Results  Component Value Date   TSH 3.257 06/22/2022   TSH 1.500 02/04/2022    Therapeutic Level Labs: No results found for: "  LITHIUM" No results found for: "VALPROATE" No results found for: "CBMZ"  Current Medications: Current Outpatient Medications  Medication Sig Dispense Refill   doxycycline (VIBRAMYCIN) 50 MG capsule Take 1 capsule (50 mg total) by mouth daily. 90 capsule 1   buprenorphine (SUBUTEX) 8 MG SUBL SL tablet Place 8 mg under the tongue 3 (three) times daily.     diphenhydrAMINE (BENADRYL) 50 MG tablet Take 2-4 tablets q6h as needed for restlessness related to Haldol (Patient taking differently: Take 25 mg by mouth every 8 (eight) hours as needed (For restlessness per patient).) 100 tablet 2   finasteride (PROSCAR) 5 MG tablet Take 1 tablet (5 mg total) by mouth daily. 30 tablet  2   Fluocinolone Acetonide 0.01 % OIL      gabapentin (NEURONTIN) 400 MG capsule Take 1 capsule (400 mg total) by mouth 2 (two) times daily for 180 doses. 180 capsule 0   haloperidol (HALDOL) 5 MG tablet Take 3 tablets (15 mg total) by mouth at bedtime. 90 tablet 2   hydrocortisone 2.5 % lotion Apply topically.     hydrOXYzine (ATARAX) 25 MG tablet Take 25 mg by mouth in the morning and at bedtime.     levothyroxine (SYNTHROID) 150 MCG tablet Take 1 tablet (150 mcg total) by mouth daily. 30 tablet 11   metroNIDAZOLE (METROGEL) 0.75 % gel      mirtazapine (REMERON) 7.5 MG tablet Take 1 tablet (7.5 mg total) by mouth at bedtime. 30 tablet 2   nicotine (NICODERM CQ - DOSED IN MG/24 HOURS) 21 mg/24hr patch Place 21 mg onto the skin daily.     tamsulosin (FLOMAX) 0.4 MG CAPS capsule Take by mouth.     No current facility-administered medications for this visit.   Musculoskeletal: Strength & Muscle Tone: Telepsych visit-Grossly normal Musculoskeletal and cranial nerve inspections Gait & Station: NA Patient leans: N/A  Psychiatric Specialty Exam: Review of Systems  Constitutional:  Positive for activity change and fatigue. Negative for appetite change, chills, diaphoresis, fever and unexpected weight change.  HENT:  Negative for congestion, dental problem, nosebleeds, postnasal drip, rhinorrhea, sinus pressure, sinus pain, sneezing, sore throat, tinnitus, trouble swallowing and voice change.   Eyes:  Negative for photophobia, pain, discharge, redness, itching and visual disturbance.  Respiratory:  Negative for apnea, cough, choking, chest tightness, shortness of breath, wheezing and stridor.        Vapes  Cardiovascular:  Negative for chest pain, palpitations and leg swelling.  Gastrointestinal:  Negative for abdominal distention, abdominal pain, anal bleeding, blood in stool, constipation, diarrhea, nausea, rectal pain and vomiting.  Endocrine: Negative for cold intolerance, heat intolerance,  polydipsia, polyphagia and polyuria.       Hypothyroid  Genitourinary:  Positive for difficulty urinating (BPH requiring Cath). Negative for decreased urine volume, dysuria, enuresis, flank pain, frequency, genital sores, hematuria, penile discharge, penile pain, penile swelling, scrotal swelling, testicular pain and urgency.  Skin:  Positive for color change and rash. Negative for pallor and wound.       Rosacea  Allergic/Immunologic: Negative for environmental allergies, food allergies and immunocompromised state.  Neurological:  Positive for tremors. Negative for dizziness, seizures, syncope, facial asymmetry, speech difficulty, weakness, light-headedness, numbness and headaches.  Psychiatric/Behavioral:  Positive for dysphoric mood and sleep disturbance. Negative for agitation, behavioral problems, confusion, decreased concentration, hallucinations, self-injury and suicidal ideas. The patient is nervous/anxious. The patient is not hyperactive.     There were no vitals taken for this visit.There is no height or weight on  file to calculate BMI.My Chart  General Appearance: Casual  Eye Contact:  Good  Speech:  Clear and Coherent and Normal Rate  Volume:  Normal  Mood:  Anxious and Dysphoric  Affect:  Congruent  Thought Process:  Coherent, Goal Directed, and Descriptions of Associations: Intact  Orientation:  Full (Time, Place, and Person)  Thought Content: WDL Denies any hallucination  Suicidal Thoughts:  No  Homicidal Thoughts:  No  Memory:   Affected by trauma and substance abuse and Lt basilar strokes (unrecognized clinically during a period of IV drug use)  Judgement:  Impaired  Insight:  Fair  Psychomotor Activity:   No hyperactivity seen on videa  Concentration:  Concentration: Good and Attention Span: Good  Recall:   see memory  Fund of Knowledge:  WDL  Language: Good  Akathisia:   None noted today-no complaint  Handed:  Right  AIMS (if indicated): done  Assets:  Desire for  Improvement Financial Resources/Insurance Housing Resilience Social Support Talents/Skills Transportation Vocational/Educational  ADL's:  Intact except for self medication  Cognition: Impaired,  Mild and Moderate  Sleep:   Hyper per HPI/ROS   Screenings: [place] Office Visit from 11/27/2021 in [facility] Visit from 11/06/2021 in [facility] Visit from 10/23/2021 in [facility] Office Visit from 10/16/2021 in [facility] Admission (Discharged) from 10/02/2021 in [place] [facility]  AIMS Total Score 4 7 5 21  0      Flowsheet Row ED to Hosp-Admission (Discharged) from 06/20/2022 in [facility] ED from 06/13/2022 in [facility] Emergency Department at [facility] ED from 05/25/2022 in 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 and Hypertension)) Hallucinosis in remission at 15 mg Haldol No taper today Akathisia absent without complaint today Hypersomnia ?etiology Modanafil Dependence Nicotene Dependence BPH with obstruction (currently catheterized) Hypertension Hypoythyroid  Plan: Per pt request :  Repeat sleep study ordered/ hold off on discontinuing Modanafil pending study  Repeat Thyroid Panel ordered Continue Haldol 15 mg Urology Consult as ordered Continue other medications as before (Doxycyclie reordered-computer error corrected that prevented electronic Rx) FU 1 week      [name], PA-C 07/09/2022, 3:41 PM

## 2022-07-13 ENCOUNTER — Other Ambulatory Visit (HOSPITAL_COMMUNITY): Payer: Self-pay | Admitting: *Deleted

## 2022-07-13 DIAGNOSIS — E039 Hypothyroidism, unspecified: Secondary | ICD-10-CM

## 2022-07-16 ENCOUNTER — Encounter (HOSPITAL_COMMUNITY): Payer: Self-pay | Admitting: Medical

## 2022-07-16 ENCOUNTER — Telehealth (HOSPITAL_BASED_OUTPATIENT_CLINIC_OR_DEPARTMENT_OTHER): Payer: Medicare Other | Admitting: Medical

## 2022-07-16 DIAGNOSIS — G4710 Hypersomnia, unspecified: Secondary | ICD-10-CM

## 2022-07-16 DIAGNOSIS — F1920 Other psychoactive substance dependence, uncomplicated: Secondary | ICD-10-CM | POA: Diagnosis not present

## 2022-07-16 DIAGNOSIS — G250 Essential tremor: Secondary | ICD-10-CM

## 2022-07-16 DIAGNOSIS — Z91148 Patient's other noncompliance with medication regimen for other reason: Secondary | ICD-10-CM

## 2022-07-16 DIAGNOSIS — N401 Enlarged prostate with lower urinary tract symptoms: Secondary | ICD-10-CM

## 2022-07-16 DIAGNOSIS — I10 Essential (primary) hypertension: Secondary | ICD-10-CM

## 2022-07-16 DIAGNOSIS — I6381 Other cerebral infarction due to occlusion or stenosis of small artery: Secondary | ICD-10-CM

## 2022-07-16 DIAGNOSIS — Z923 Personal history of irradiation: Secondary | ICD-10-CM | POA: Diagnosis not present

## 2022-07-16 DIAGNOSIS — G2571 Drug induced akathisia: Secondary | ICD-10-CM

## 2022-07-16 DIAGNOSIS — F1910 Other psychoactive substance abuse, uncomplicated: Secondary | ICD-10-CM

## 2022-07-16 DIAGNOSIS — F060 Psychotic disorder with hallucinations due to known physiological condition: Secondary | ICD-10-CM

## 2022-07-16 DIAGNOSIS — I6930 Unspecified sequelae of cerebral infarction: Secondary | ICD-10-CM

## 2022-07-16 DIAGNOSIS — E039 Hypothyroidism, unspecified: Secondary | ICD-10-CM

## 2022-07-16 DIAGNOSIS — I639 Cerebral infarction, unspecified: Secondary | ICD-10-CM

## 2022-07-16 DIAGNOSIS — L719 Rosacea, unspecified: Secondary | ICD-10-CM

## 2022-07-16 DIAGNOSIS — N138 Other obstructive and reflux uropathy: Secondary | ICD-10-CM

## 2022-07-16 DIAGNOSIS — G4730 Sleep apnea, unspecified: Secondary | ICD-10-CM

## 2022-07-16 DIAGNOSIS — Z79899 Other long term (current) drug therapy: Secondary | ICD-10-CM

## 2022-07-16 DIAGNOSIS — D320 Benign neoplasm of cerebral meninges: Secondary | ICD-10-CM

## 2022-07-16 MED ORDER — BENZTROPINE MESYLATE 2 MG PO TABS: 2.0000 mg | ORAL_TABLET | Freq: Two times a day (BID) | ORAL | 2 refills | Status: DC

## 2022-07-16 NOTE — Progress Notes (Signed)
[place] MD/PA/NP OP [facility] Visit via Video Note  I connected with Paul Bray on 07/16/2022 at  3:30 PM EDT by a video enabled telemedicine application and verified that I am speaking with the correct person using two identifiers.  Location: Patient: At home Provider: [facility]   I discussed the limitations of evaluation and management by telemedicine and the availability of in person appointments. The patient expressed understanding and agreed to proceed.  History of Present Illness:See EPIC note    Observations/Objective:See EPIC note   Assessment and Plan:See EPIC note   Follow Up Instructions:See EPIC note   I discussed the assessment and treatment plan with the patient. The patient was provided an opportunity to ask questions and all were answered. The patient agreed with the plan and demonstrated an understanding of the instructions.   The patient was advised to call back or seek an in-person evaluation if the symptoms worsen or if the condition fails to improve as anticipated.  I provided 20 minutes of non-face-to-face time during this encounter.   [name], PA-C   07/23/2022 1:56 PM Paul Bray  MRN:  [id number]  Chief Complaint:  Chief Complaint  Patient presents with   Follow-up   Addiction Problem   Agitation   Anxiety   Depression   Hallucinations   HPI: Pt seen for weekly FU S/P D/C for psychotic relapse brought back to no hallucinosis at 15mg  of Haldol. Wants to reduce Haldol. C/O tremors. He has gotten catheter out and is going to have a procedure ?what he does not know. Remainder of ROS negative today except for chronic sleep disorder. He has not been contacted regarding getting test. He has not gotten his Thyroid blood work.He did FU with his PCP: Encounter Details Date Type Department Care Team (Latest Contact Info) Description  07/13/2022 4:00 PM EDT [place], [place] [id number]  [id number]  Ronnie Derby, PA-C  [place]  [place]  [place], [place] [address]  [phone] (Work)  Paranoid schizophrenia (CMS/HCC) (Primary Dx); GAD (generalized anxiety disorder); Urinary retention due to benign prostatic hyperplasia; Foley catheter in place; Hospital discharge follow-up   Visit Diagnosis:    ICD-10-CM   1. Acquired hypothyroidism  E03.9     2. 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 Multiple lacunar infarcts (HCC)  I63.81     4. Polysubstance dependence ([place])  F19.20     5. Benign localized hyperplasia of prostate with urinary obstruction  N40.1    N13.8     6. Meningioma, cerebral ([place])  D32.0     7. Status post gamma knife treatment  Z92.3     8. Familial tremor  G25.0     9. Hypersomnia with sleep apnea  G47.10    G47.30     10. Rosacea  L71.9     11. Essential hypertension  I10     12. Medication management  Z79.899     13. Noncompliance with medication treatment due to abuse of medication  Z91.148     14. Akathisia  G25.71     15. Benign prostatic hyperplasia with lower urinary tract symptoms, symptom details unspecified  N40.1     16. Drug abuse, IV ([place])  F19.10     17. Persistent hypersomnia  G47.10     18. Infarction of  left basal ganglia (HCC)  I63.9     19. 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 Dr Lynnda Shields C/O of paranoia he is aware of but cant stop/completely control.Has rx for Risperdal from [employer]. Also hx of Gamma Knife tradiation fo benign brain tumor ?2018 Says has records Requesting  Psychiatrist/medication mangement for paranoia hx [facility] [place] 08/21/2021 - NOW [facility] INPATIENT  Date of Admission:  10/02/2021 Date of Discharge: 10/13/2021  Date of Admission:06/13/2022 [facility] ED)  Date of Discharge:06/16/22  Date of Admission: 06/25/2022  Date of Discharge:06/29/2022   [place] 05/27/2022- 2/ [facility] Alcohol intoxication 03/26/2017  Alcohol withdrawal delirium 03/26/2017  Chronic daily headache 02/25/2017  Alcohol use disorder, severe, dependence 02/22/2017  Heroin dependence MAT Methadone [place]/Subutex TBR 01/2018  Anxiety disorder, unspecified 01/20/2017  Paranoia 03/2018    [place], Stella O, NP - 04/12/2017 12:38 PM EST Formatting of this note might be different from the original. [facility] Psychiatry - Substance Abuse PHP/IOP Intake Assessment  BH Formulate an individual and appropriate relapse prevention plan.   [place] SU Problem: Formation of a relapse prevention plan    [facility] Uncomplicated opioid dependence (CMS/HCC) 12/24/2017  1. Discharge from hospital. 2. Follow up at [place] D/C FU 07/13/2022 as in HPI above Past Medical History:  Diagnosis Date   ADHD    Anxiety    Brain tumor ([place])    balance and occ memory issues and headaches   Brain tumor ([place])    sees [facility] q year   Depression    Headache    migraine and tension   Hypothyroidism    Soft tissue mass    left shoulder   Substance abuse ([place])     Past Surgical History:  Procedure Laterality Date   colonscopy     gamma knife radiation 2018 for brain tumor'     hematoma removed from arm Left    MASS EXCISION Left 06/30/2019   Procedure: EXCISION OF SOFT TISSUE  MASS LEFT SHOULDER;  Surgeon: Armandina Gemma, MD;  Location: [place];  Service: General;  Laterality: Left;  LMA    Family Psychiatric History:   Alcoholism and drug abuse on father's side      Family History:  Hypertension Mother   Hypertension/Alcoholism/Addiction Father   Social History:  Social History   Socioeconomic History    Marital status: Single       Spouse name: Not on file   Number of children: NA   Years of education: Not on file   Highest education level: Not on file  Occupational History   Computer Programmer until 2016  Tobacco Use   Smoking status: Former      Types: Cigarettes   Smokeless tobacco: Current Vape   Tobacco comments:      quit cigarettes jan 2020  Vaping Use   Vaping Use: Every day  Substance and Sexual Activity   Alcohol use: Not Currently      Comment: quit Oct 2019   Drug use: Not Currently      Types: Cocaine, IV, Heroin, MDMA (Ecstacy)      Comment: last  reported use; cocaine and heroin in 2019; MDMA Feb 2023   Sexual activity: Not Currently  Other Topics Concern   Not on file  Social History Narrative    Living with mother now      Social Determinants of Health     Difficulty of Paying Living Expenses: Disability  Food Insecurity:    Worried About Charity fundraiser in the Last Year: On disability   Arboriculturist in the Last Year:   Transportation Needs:    Film/video editor (Medical): Uses Ecologist (Non-Medical):   Physical Activity:    Days of Exercise per Week:    Minutes of Exercise per Session:   Stress:    Feeling of Stress : Constant   Social Connections:    Frequency of Communication with Friends and Family: Mother recently kicked him out of house   Frequency of Social Gatherings with Friends and Family: Lives alone-   Attends Religious Services: No   Active Member of Clubs or Organizations: Restarting active with AA /NA        Social Connections: Mother/Computer/Had started AA until breakdown     Allergies:  Allergies  Allergen Reactions   Ingrezza [Valbenazine Tosylate] Other (See Comments)    Severe tongue chewing at 80 mg Tolerates lower doses   Naloxone  Palpitations    Pt report MAT Rx is Subutex/Buprenorphine only   Amphetamine-Dextroamphetamine Other (See Comments)    unknown    Metabolic Disorder Labs: Lab Results  Component Value Date   HGBA1C 5.0 10/03/2021   MPG 96.8 10/03/2021   No results found for: "PROLACTIN" Lab Results  Component Value Date   CHOL 191 10/03/2021   TRIG 92 10/03/2021   HDL 48 10/03/2021   CHOLHDL 4.0 10/03/2021   VLDL 18 10/03/2021   LDLCALC 125 (H) 10/03/2021   Lab Results  Component Value Date   TSH 3.257 06/22/2022   TSH 1.500 02/04/2022    Therapeutic Level Labs:NA  Current Medications: Current Outpatient Medications  Medication Sig Dispense Refill   benztropine (COGENTIN) 2 MG tablet Take 1 tablet (2 mg total) by mouth 2 (two) times daily. 60 tablet 2   buprenorphine (SUBUTEX) 8 MG SUBL SL tablet Place 8 mg under the tongue 3 (three) times daily.     diphenhydrAMINE (BENADRYL) 50 MG tablet Take 2-4 tablets q6h as needed for restlessness related to Haldol (Patient taking differently: Take 25 mg by mouth every 8 (eight) hours as needed (For restlessness per patient).) 100 tablet 2   doxycycline (VIBRAMYCIN) 50 MG capsule Take 1 capsule (50 mg total) by mouth daily. 90 capsule 1   finasteride (PROSCAR) 5 MG tablet Take 1 tablet (5 mg total) by mouth daily. 30 tablet 2   Fluocinolone Acetonide 0.01 % OIL      gabapentin (NEURONTIN) 400 MG capsule Take 1 capsule (400 mg total) by mouth 2 (two) times daily for 180 doses. 180 capsule 0   haloperidol (HALDOL) 5 MG tablet Take 3 tablets (15 mg total) by mouth at bedtime. 90 tablet 2   hydrocortisone 2.5 % lotion Apply topically.     hydrOXYzine (ATARAX) 25 MG tablet Take 25 mg by mouth in the morning and at bedtime.     levothyroxine (SYNTHROID) 150 MCG tablet Take 1 tablet (150 mcg total) by mouth daily. 30 tablet 11   metroNIDAZOLE (METROGEL) 0.75 % gel      mirtazapine (REMERON) 7.5 MG tablet Take 1 tablet (  7.5 mg total) by mouth at bedtime. 30  tablet 2   nicotine (NICODERM CQ - DOSED IN MG/24 HOURS) 21 mg/24hr patch Place 21 mg onto the skin daily.     tamsulosin (FLOMAX) 0.4 MG CAPS capsule Take by mouth.     No current facility-administered medications for this visit.    Musculoskeletal: Strength & Muscle Tone: Telepsych visit-Grossly normal Musculoskeletal and cranial nerve inspections Gait & Station: NA Patient leans: N/A  Psychiatric Specialty Exam: Review of Systems  Constitutional:  Negative for activity change, appetite change, chills, diaphoresis, fatigue, fever and unexpected weight change.  Respiratory: Negative.    Cardiovascular: Negative.   Gastrointestinal: Negative.   Endocrine: Negative for cold intolerance, heat intolerance, polydipsia, polyphagia and polyuria.  Genitourinary:  Positive for difficulty urinating. Negative for decreased urine volume, dysuria, enuresis, flank pain, frequency, genital sores, hematuria, penile discharge, penile pain, penile swelling, scrotal swelling, testicular pain and urgency.  Neurological:  Positive for tremors. Negative for dizziness, seizures, syncope, facial asymmetry, speech difficulty, weakness, light-headedness, numbness and headaches.  Psychiatric/Behavioral:  Positive for agitation and sleep disturbance (Chronic). Negative for behavioral problems, confusion, decreased concentration, dysphoric mood, hallucinations, self-injury and suicidal ideas. The patient is nervous/anxious. The patient is not hyperactive.     There were no vitals taken for this visit.There is no height or weight on file to calculate BMI.My Chart visit  General Appearance: Casual and Fairly Groomed  Eye Contact:  Good  Speech:  Clear and Coherent  Volume:  Normal  Mood:  Anxious  Affect:  Flat  Thought Process:  Coherent, Goal Directed, and Descriptions of Associations: Intact  Orientation:  Full (Time, Place, and Person)  Thought Content: WDL and Logical   Suicidal Thoughts:  No  Homicidal  Thoughts:  No  Memory:   Affected by SUDS/Psychotic episodes  Judgement:  Impaired  Insight:  Lacking  Psychomotor Activity:  Increased  Concentration:  Concentration: Good and Attention Span: Good  Recall:   See memory  Fund of Knowledge:  WDL  Language: Good  Akathisia:  Yes  Handed:  Right  AIMS (if indicated): not done  Assets:  Desire for Improvement Financial Resources/Insurance Housing Resilience Social Support Talents/Skills Transportation Vocational/Educational  ADL's:  Impaired  Cognition: Impaired,  Mild and Moderate  Sleep:  Poor   Screenings: [place] Office Visit from 11/27/2021 in [facility] Visit from 11/06/2021 in [facility] Visit from 10[facility]23/2021 in [facility] Office Visit from 10/16/2021 in [facility] Admission (Discharged) from 10/02/2021 in [place] [facility]  AIMS Total Score 4 7 5 21  0      Flowsheet Row ED to Hosp-Admission (Discharged) from 06/20/2022 in [facility] ED from 06/13/2022 in [facility] Emergency Department at [facility] ED from 05/25/2022 in [facility] Emergency Department at [place] No Risk No Risk        Assessment  Stable without psychosis Akathisia    and Plan:  Rx Cogentin  Benadryl PRN only Decrease Haldol to 12.5 MG Continue remainder of D/C meds as ordered  FU 1 week Mother informed   3/21/20245:21 PM

## 2022-07-23 ENCOUNTER — Telehealth (HOSPITAL_BASED_OUTPATIENT_CLINIC_OR_DEPARTMENT_OTHER): Payer: Medicare Other | Admitting: Medical

## 2022-07-23 ENCOUNTER — Encounter (HOSPITAL_COMMUNITY): Payer: Self-pay | Admitting: Medical

## 2022-07-23 DIAGNOSIS — G4730 Sleep apnea, unspecified: Secondary | ICD-10-CM

## 2022-07-23 DIAGNOSIS — I6381 Other cerebral infarction due to occlusion or stenosis of small artery: Secondary | ICD-10-CM

## 2022-07-23 DIAGNOSIS — D320 Benign neoplasm of cerebral meninges: Secondary | ICD-10-CM

## 2022-07-23 DIAGNOSIS — I639 Cerebral infarction, unspecified: Secondary | ICD-10-CM

## 2022-07-23 DIAGNOSIS — F1920 Other psychoactive substance dependence, uncomplicated: Secondary | ICD-10-CM

## 2022-07-23 DIAGNOSIS — F0634 Mood disorder due to known physiological condition with mixed features: Secondary | ICD-10-CM

## 2022-07-23 DIAGNOSIS — F1910 Other psychoactive substance abuse, uncomplicated: Secondary | ICD-10-CM

## 2022-07-23 DIAGNOSIS — Z923 Personal history of irradiation: Secondary | ICD-10-CM | POA: Diagnosis not present

## 2022-07-23 DIAGNOSIS — F060 Psychotic disorder with hallucinations due to known physiological condition: Secondary | ICD-10-CM

## 2022-07-23 DIAGNOSIS — G4710 Hypersomnia, unspecified: Secondary | ICD-10-CM

## 2022-07-23 DIAGNOSIS — N401 Enlarged prostate with lower urinary tract symptoms: Secondary | ICD-10-CM

## 2022-07-23 DIAGNOSIS — G250 Essential tremor: Secondary | ICD-10-CM | POA: Diagnosis not present

## 2022-07-23 DIAGNOSIS — G2571 Drug induced akathisia: Secondary | ICD-10-CM

## 2022-07-23 DIAGNOSIS — E039 Hypothyroidism, unspecified: Secondary | ICD-10-CM

## 2022-07-23 DIAGNOSIS — Z8659 Personal history of other mental and behavioral disorders: Secondary | ICD-10-CM

## 2022-07-23 MED ORDER — BUPROPION HCL ER (XL) 150 MG PO TB24: ORAL_TABLET | ORAL | 0 refills | Status: DC

## 2022-07-23 MED ORDER — MODAFINIL 200 MG PO TABS: 400.0000 mg | ORAL_TABLET | Freq: Every day | ORAL | 1 refills | Status: DC

## 2022-07-23 NOTE — Progress Notes (Signed)
[facility] MD/PA/NP OP Progress Note  07/23/2022 4:19 PM Paul Bray  MRN:  [id number] Virtual Visit via Video Note  I connected with Paul Bray on 07/23/22 at  3:00 PM EDT by a video enabled telemedicine application and verified that I am speaking with the correct person using two identifiers.  Location: Patient: Home Provider: [facility]   I discussed the limitations of evaluation and management by telemedicine and the availability of in person appointments. The patient expressed understanding and agreed to proceed.   History of Present Illness:See EPIC note    Observations/Objective:See EPIC note   Assessment and Plan:See EPIC note   Follow Up Instructions:See EPIC note    I discussed the assessment and treatment plan with the patient. The patient was provided an opportunity to ask questions and all were answered. The patient agreed with the plan and demonstrated an understanding of the instructions.   The patient was advised to call back or seek an in-person evaluation if the symptoms worsen or if the condition fails to improve as anticipated.  I provided 30 minutes of non-face-to-face time during this encounter.   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 He is currently on MAT Suboxone for his heroin dependency. He was  recently hospitalized after he went into his medication supply and abused his Modafanil and Suboxone and began to hallucinate. He had had a previous episode put into remission by 20 mg Haldol/unresponsive to 2nd generation antipsychotics (Abilify;Zyprexa;Risperdone). He had been tapered to 1 mg of Haldol as well and in addition to abusing his controlled substance meds , he had not been taking Haldol a prescribed.  He was discharged 06/29/2022 on 15 mg of Haldol free of hallucinations but c/o akathisia in back ground of familial tremor.He does not com[plain of tremors today and reports Cogentin has helped. His case is further complicated by Hypersomnia/?Chronic Fatigue which he claims is alleviated by the Modafinil.(Amphetamines aren't indicated given his history of IV Cocaine dependence) Attempts to seperate him from this medication have met with significant resistance with complaints of poor quality of life/depression.He was discharged on 300mg  of drug having at one point been on 600mg  daily (NIHS review). A sleep study request 3 weeks ago has not been responded to. He wants to return to 400 mg of Modafinil. 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  Visit Diagnosis:    ICD-10-CM   1. Polysubstance dependence (Paul Bray)  F19.20     2. Multiple lacunar infarcts (HCC)  I63.81     3. Infarction of left basal ganglia (HCC)  I63.9     4. Drug abuse, IV ([place])  F19.10     5. 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 Meningioma, cerebral (Paul Bray)  D32.0     7. Status post gamma knife treatment  Z92.3     8. Benign localized hyperplasia of prostate with urinary obstruction  N40.1    N13.8     9. Familial tremor  G25.0     10. Hypersomnia with sleep apnea  G47.10    G47.30     11. Hypothyroidism, unspecified type  E03.9     12. History of  ADHD  Z86.59     13. 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 Drug induced akathisia  G25.71       Past Psychiatric History:  [facility]OVANT Alcohol intoxication 03/26/2017  Alcohol withdrawal delirium 03/26/2017  Chronic daily headache 02/25/2017  Alcohol use disorder, severe, dependence 02/22/2017  Heroin dependence MAT Methadone Metro/Subutex TBR 01/2018  Anxiety disorder, unspecified 01/20/2017  Paranoia 03/2018  [facility] Unk Pinto, NP - 04/12/2017 12:38 PM EST Formatting of this note might be different from the original. [facility] Psychiatry - Substance Abuse PHP/IOP Intake Assessment  BH Formulate an individual and appropriate relapse prevention plan.   [place] SU Problem: Formation of a relapse prevention plan     [facility] Uncomplicated opioid dependence (CMS/HCC) 12/24/2017  1. Discharge from hospital. 2. Follow up at [facility] Date of recent admission: 10/29/21 Date of recent discharge: 11/01/21 [place] Paul Bray 6/17/-03/04/2020  Pt with Opiate dependence MAT Suboxone at [facility] Dr Paul Bray C/O of paranoia he is aware of but cant stop/completely control.Has rx for Risperdal from [employer]. Also hx of Gamma Knife tradiation fo benign brain tumor ?2018 Says has records Requesting Psychiatrist/medication mangement for paranoia hx [facility] [place] 08/21/2021 - NOW [facility] INPATIENT  Date of Admission:  10/02/2021 Date of Discharge: 10/13/2021  Date of Admission:06/13/2022 [facility] ED)  Date of Discharge:06/16/22  Date of Admission: 06/25/2022  Date of Discharge:06/29/2022   [facility] 05/27/2022- 06/03/2022   Past Medical History:  Care Everywhere reviewed-No New Info    07/13/2022  Follow-Up [facility]   Past Medical History:  Diagnosis Date   ADHD    Anxiety    Brain  tumor (Paul Bray)    balance and occ memory issues and headaches   Brain tumor (Paul Bray)    sees [facility] q year   Depression    Headache    migraine and tension   Hypothyroidism    Soft tissue mass    left shoulder   Substance abuse (Paul Bray)     Past Surgical History:  Procedure Laterality Date   colonscopy     gamma knife radiation 2018 for brain tumor'     hematoma removed from arm Left    MASS EXCISION Left 06/30/2019   Procedure: EXCISION OF SOFT TISSUE  MASS LEFT SHOULDER;  Surgeon: Armandina Gemma, MD;  Location: [place];  Service: General;  Laterality: Left;  LMA    Family Psychiatric History:   Alcoholism and drug abuse on father's side    Family History:  Hypertension Mother   Hypertension/Alcoholism/Addiction Father   Social History:  Social History   Socioeconomic History   Marital status: Single    Spouse name: Not on file   Number of children: Not on file   Years of education: Not on file  Highest education level: Not on file  Occupational History   Not on file  Tobacco Use   Smoking status: Former    Types: Cigarettes   Smokeless tobacco: Current   Tobacco comments:    quit jan 2020  Vaping Use   Vaping Use: Every day  Substance and Sexual Activity   Alcohol use: Not Currently    Comment: quit Oct 2019   Drug use: Not Currently    Types: Cocaine, IV, Heroin, MDMA (Ecstacy)    Comment: last reported use; cocaine and heroin in 2019; MDMA Feb 2023   Sexual activity: Not Currently  Other Topics Concern   Not on file  Social History Narrative   Not on file   Social Determinants of Health    Difficulty of Paying Living Expenses: Disability  Food Insecurity:    Worried About [place] in the Last Year: On disability   Ran Out of Food in the Last Year:   Transportation Needs:    Film/video editor (Medical): Uses [employer] (Non-Medical):   Physical Activity:    Days of Exercise per Week:    Minutes  of Exercise per Session:   Stress:    Feeling of Stress : Constant   Social Connections:    Frequency of Communication with Friends and Family: Mother recently kicked him out of house   Frequency of Social Gatherings with Friends and Family: Lives alone-   Attends Religious Services: No   Active Member of Clubs or Organizations: Restarting active with AA /NA        Social Connections: Mother/Computer/Had started AA until breakdown     Allergies:  Allergies  Allergen Reactions   Ingrezza [Valbenazine Tosylate] Other (See Comments)    Severe tongue chewing at 80 mg Tolerates lower doses   Naloxone Palpitations    Pt report MAT Rx is Subutex/Buprenorphine only   Amphetamine-Dextroamphetamine Other (See Comments)    unknown    Metabolic Disorder Labs: Lab Results  Component Value Date   HGBA1C 5.0 10/03/2021   MPG 96.8 10/03/2021   No results found for: "PROLACTIN" Lab Results  Component Value Date   CHOL 191 10/03/2021   TRIG 92 10/03/2021   HDL 48 10/03/2021   CHOLHDL 4.0 10/03/2021   VLDL 18 10/03/2021   LDLCALC 125 (H) 10/03/2021   Lab Results  Component Value Date   TSH 3.257 06/22/2022   TSH 1.500 02/04/2022    Therapeutic Level Labs:NA  Current Medications: Current Outpatient Medications  Medication Sig Dispense Refill   modafinil (PROVIGIL) 200 MG tablet Take 2 tablets (400 mg total) by mouth daily. 60 tablet 1   benztropine (COGENTIN) 2 MG tablet Take 1 tablet (2 mg total) by mouth 2 (two) times daily. 60 tablet 2   buprenorphine (SUBUTEX) 8 MG SUBL SL tablet Place 8 mg under the tongue 3 (three) times daily.     diphenhydrAMINE (BENADRYL) 50 MG tablet Take 2-4 tablets q6h as needed for restlessness related to Haldol (Patient taking differently: Take 25 mg by mouth every 8 (eight) hours as needed (For restlessness per patient).) 100 tablet 2   doxycycline (VIBRAMYCIN) 50 MG capsule Take 1 capsule (50 mg total) by mouth daily. 90 capsule 1   finasteride  (PROSCAR) 5 MG tablet Take 1 tablet (5 mg total) by mouth daily. 30 tablet 2   Fluocinolone Acetonide 0.01 % OIL      gabapentin (NEURONTIN) 400 MG capsule Take 1 capsule (400  mg total) by mouth 2 (two) times daily for 180 doses. 180 capsule 0   haloperidol (HALDOL) 5 MG tablet Take 3 tablets (15 mg total) by mouth at bedtime. 90 tablet 2   hydrocortisone 2.5 % lotion Apply topically.     hydrOXYzine (ATARAX) 25 MG tablet Take 25 mg by mouth in the morning and at bedtime.     levothyroxine (SYNTHROID) 150 MCG tablet Take 1 tablet (150 mcg total) by mouth daily. 30 tablet 11   metroNIDAZOLE (METROGEL) 0.75 % gel      mirtazapine (REMERON) 7.5 MG tablet Take 1 tablet (7.5 mg total) by mouth at bedtime. 30 tablet 2   nicotine (NICODERM CQ - DOSED IN MG/24 HOURS) 21 mg/24hr patch Place 21 mg onto the skin daily.     tamsulosin (FLOMAX) 0.4 MG CAPS capsule Take by mouth.     No current facility-administered medications for this visit.     Musculoskeletal: Strength & Muscle Tone: Telepsych visit-Grossly normal Musculoskeletal and cranial nerve inspections Gait & Station: NA Patient leans: N/A  Psychiatric Specialty Exam: Review of Systems  Constitutional:  Positive for activity change and fatigue. Negative for appetite change, chills, diaphoresis, fever and unexpected weight change.  HENT: Negative.    Eyes: Negative.   Respiratory: Negative.    Cardiovascular: Negative.   Gastrointestinal: Negative.   Endocrine: Negative for cold intolerance, heat intolerance, polydipsia, polyphagia and polyuria.  Genitourinary:  Positive for difficulty urinating. Negative for decreased urine volume, dysuria, enuresis, flank pain, frequency, genital sores, hematuria, penile discharge, penile pain, penile swelling, scrotal swelling, testicular pain and urgency.  Musculoskeletal: Negative.   Skin:  Positive for color change (Rosacea). Negative for pallor, rash and wound.  Neurological:  Negative for  dizziness, tremors, seizures, syncope, facial asymmetry, speech difficulty, weakness, light-headedness, numbness and headaches.  Psychiatric/Behavioral:  Positive for dysphoric mood and sleep disturbance. Negative for agitation, behavioral problems, confusion, decreased concentration, hallucinations, self-injury and suicidal ideas. The patient is not nervous/anxious and is not hyperactive.     There were no vitals taken for this visit.There is no height or weight on file to calculate BMI.MY CHART Visit  General Appearance: Casual and Well Groomed  Eye Contact:  Fair  Speech:  Clear and Coherent and Normal Rate  Volume:  Normal  Mood:  Euthymic  Affect:  Appropriate and Congruent  Thought Process:  Coherent, Goal Directed, and Descriptions of Associations: Intact  Orientation:  Full (Time, Place, and Person)  Thought Content: WDL and Logical   Suicidal Thoughts:  No  Homicidal Thoughts:  No  Memory:  Affected by SUDS/Psychotic episodes   Judgement:  Impaired  Insight:   Limited  Psychomotor Activity:  Normal  Concentration:  Concentration: Good and Attention Span: Good  Recall:   See memory  Fund of Knowledge:  WDL  Language: Good  Akathisia:  Negative  Handed:  Right  AIMS (if indicated): done  Assets:  Desire for Improvement Financial Resources/Insurance Housing Resilience Social Support Talents/Skills Transportation Vocational/Educational  ADL's:  Intact  Cognition: Impaired,  Mild and Moderate  Sleep:  Poor   Screenings: [place] Office Visit from 11/27/2021 in [facility] Visit from 11/06/2021 in [facility] Office Visit from 10/23/2021 in [facility] Office Visit from 10/16/2021 in [facility] Admission (Discharged) from 10/02/2021 in [facility]  AIMS Total Score 4  7 5 21  0  Flowsheet Row ED to Hosp-Admission (Discharged) from 06/20/2022 in [facility] ED from 06/13/2022 in [facility] Emergency Department at [facility] ED from 05/25/2022 in [facility] Emergency Department at [place] No Risk No Risk        Assessment  Remains free of Psychosis Akathisia absent C/O ongoing fatige-requests return to 400mg  Modafinil Sleep study pending Appears to be having some dysthymia in terms of activity  and Plan:  Rerequest sleep study Rx Modanafil 400mg  D/C Remeron and start WellbutriResume computer work FU 1 week sooner if needed    Paul Russian, PA-C 07/23/2022, 4:19 PM

## 2022-07-27 ENCOUNTER — Other Ambulatory Visit (HOSPITAL_COMMUNITY): Payer: Self-pay | Admitting: *Deleted

## 2022-07-30 ENCOUNTER — Telehealth (HOSPITAL_BASED_OUTPATIENT_CLINIC_OR_DEPARTMENT_OTHER): Payer: Medicare Other | Admitting: Medical

## 2022-07-30 ENCOUNTER — Encounter (HOSPITAL_COMMUNITY): Payer: Self-pay | Admitting: Medical

## 2022-07-30 DIAGNOSIS — L719 Rosacea, unspecified: Secondary | ICD-10-CM

## 2022-07-30 DIAGNOSIS — G2571 Drug induced akathisia: Secondary | ICD-10-CM

## 2022-07-30 DIAGNOSIS — F0634 Mood disorder due to known physiological condition with mixed features: Secondary | ICD-10-CM

## 2022-07-30 DIAGNOSIS — I639 Cerebral infarction, unspecified: Secondary | ICD-10-CM

## 2022-07-30 DIAGNOSIS — F1920 Other psychoactive substance dependence, uncomplicated: Secondary | ICD-10-CM

## 2022-07-30 DIAGNOSIS — D320 Benign neoplasm of cerebral meninges: Secondary | ICD-10-CM

## 2022-07-30 DIAGNOSIS — Z8659 Personal history of other mental and behavioral disorders: Secondary | ICD-10-CM

## 2022-07-30 DIAGNOSIS — G4710 Hypersomnia, unspecified: Secondary | ICD-10-CM

## 2022-07-30 DIAGNOSIS — I10 Essential (primary) hypertension: Secondary | ICD-10-CM

## 2022-07-30 DIAGNOSIS — G250 Essential tremor: Secondary | ICD-10-CM

## 2022-07-30 DIAGNOSIS — Z79899 Other long term (current) drug therapy: Secondary | ICD-10-CM

## 2022-07-30 DIAGNOSIS — E039 Hypothyroidism, unspecified: Secondary | ICD-10-CM

## 2022-07-30 DIAGNOSIS — F060 Psychotic disorder with hallucinations due to known physiological condition: Secondary | ICD-10-CM

## 2022-07-30 DIAGNOSIS — G4730 Sleep apnea, unspecified: Secondary | ICD-10-CM

## 2022-07-30 DIAGNOSIS — Z923 Personal history of irradiation: Secondary | ICD-10-CM

## 2022-07-30 DIAGNOSIS — I6381 Other cerebral infarction due to occlusion or stenosis of small artery: Secondary | ICD-10-CM

## 2022-07-30 DIAGNOSIS — N401 Enlarged prostate with lower urinary tract symptoms: Secondary | ICD-10-CM

## 2022-07-30 DIAGNOSIS — N138 Other obstructive and reflux uropathy: Secondary | ICD-10-CM

## 2022-07-30 DIAGNOSIS — F1910 Other psychoactive substance abuse, uncomplicated: Secondary | ICD-10-CM

## 2022-07-30 NOTE — Progress Notes (Signed)
[place] MD/PA/NP OP Progress Note  07/30/2022 5:32 PM Paul Bray  MRN:  [id number]  Chief Complaint:  Chief Complaint  Patient presents with   Follow-up   Medication Management   Addiction Problem   Alcohol Problem   Family Problem   Hallucinations  Virtual Visit via Video Note  I connected with Paul Bray on 07/30/22 at  3:00 PM EDT by a video enabled telemedicine application and verified that I am speaking with the correct person using two identifiers.  Location: Patient: Home Provider: [place]   I discussed the limitations of evaluation and management by telemedicine and the availability of in person appointments. The patient expressed understanding and agreed to proceed.   History of Present Illness:See EPIC note    Observations/Objective:See EPIC note   Assessment and Plan:See EPIC note   Follow Up Instructions:See EPIC note   I discussed the assessment and treatment plan with the patient. The patient was provided an opportunity to ask questions and all were answered. The patient agreed with the plan and demonstrated an understanding of the instructions.   The patient was advised to call back or seek an in-person evaluation if the symptoms worsen or if the condition fails to improve as anticipated.  I provided 20 minutes of non-face-to-face time during this encounter.   [name], PA-C   HPI: [name] RETURNS FOR WEEKLY CHECK ON MEDICATIONS AND PSYCHOSIS triggered by drug abuse and felt to have originated post lt basilar/lacunar infarcts clinically undetected during a time he was using IV cocaine and was known to have hypertension. This recent episode was the second time high dose Haldol restored him to a normal psychiatric cognitive state.He had been reduced to 1 mg and was doing well until he stopped taking 1 mg and broke into his controlled medicine supply. He has had associated akathisia but in the background of familial tremor and some voluntary  shaking it is difficult to know the extent. He has not complained of involuntary movements his last 2 visits. Today he looks and sounds much better. He is walkinng the dog again and has started to back on the computer where he had developed a following on You Tube before his breakdown. He did not start the Wellbutrin but has stoped thr Remeron and says he feels much better in the morning off it.Continues to be dependent on Modanaifil. Sleep study is ordered and pending verification. Continues to urinate without catheter. Visit Diagnosis:    ICD-10-CM   1. Multiple lacunar infarcts  I63.81 Polysomnography 4 or more parameters    2. Hypersomnia with sleep apnea  G47.10 Polysomnography 4 or more parameters   G47.30     3. Polysubstance dependence  F19.20     4. Infarction of left basal ganglia  I63.9     5. Drug abuse, IV  F19.10     6. 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 Meningioma, cerebral  D32.0     8. Status post gamma knife treatment  Z92.3     9. Benign localized hyperplasia of prostate with urinary obstruction  N40.1    N13.8     10. Familial tremor  G25.0     11. Hypothyroidism, unspecified type  E03.9     12. History of ADHD  Z86.59     13. 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 Drug induced akathisia  G25.71     15. Acquired hypothyroidism  E03.9     16. Rosacea  L71.9     17. Essential hypertension  I10     18. Medication management  Z79.899     19. Benign prostatic hyperplasia with lower urinary tract symptoms, symptom details unspecified  N40.1       Past Psychiatric History:     Care Everywhere reviewed-No [place] Date of recent admission: 10/29/21 Date of recent discharge: 11/01/21 [facility] [place] 6/17/-03/04/2020  Pt with Opiate dependence MAT Suboxone at [address]  Lynnda Shields C/O of paranoia he is aware of but cant stop/completely control.Has rx for Risperdal from [employer]. Also hx of Gamma Knife tradiation fo benign brain tumor ?2018 Says has records Requesting Psychiatrist/medication mangement for paranoia hx [facility] [place] 08/21/2021 - NOW [facility] INPATIENT  Date of Admission:  10/02/2021 Date of Discharge: 10/13/2021  Date of Admission:06/13/2022 [facility] ED)  Date of Discharge:06/16/22  Date of Admission: 06/25/2022  Date of Discharge:06/29/2022   [place] 05/27/2022- 2/ [facility] Alcohol intoxication 03/26/2017  Alcohol withdrawal delirium 03/26/2017  Chronic daily headache 02/25/2017  Alcohol use disorder, severe, dependence 02/22/2017  Heroin dependence MAT Methadone [place]/Subutex TBR 01/2018  Anxiety disorder, unspecified 01/20/2017  Paranoia 03/2018    [place], Stella O, NP - 04/12/2017 12:38 PM EST Formatting of this note might be different from the original. [facility] - Substance Abuse PHP/IOP Intake Assessment  BH Formulate an individual and appropriate relapse prevention plan.   [place] SU Problem: Formation of a relapse prevention plan    [facility] Uncomplicated opioid dependence (CMS/HCC) 12/24/2017  1. Discharge from hospital. 2. Follow up at [facility]   Past Medical History:  Care [place] Reviewed  No new visits  Urology Ultrasound Pending  Past Medical History:  Diagnosis Date   ADHD    Anxiety    Brain tumor ([place])    balance and occ memory issues and headaches   Brain tumor ([place])    sees [facility] q year   Depression    Headache    migraine and tension   Hypothyroidism    Soft tissue mass    left shoulder   Substance abuse ([place])     Past Surgical History:  Procedure Laterality Date   colonscopy     gamma knife radiation 2018 for brain tumor'     hematoma removed from arm Left    MASS EXCISION Left 06/30/2019   Procedure: EXCISION OF SOFT TISSUE  MASS LEFT  SHOULDER;  Surgeon: Armandina Gemma, MD;  Location: [place];  Service: General;  Laterality: Left;  LMA    Family Psychiatric History:    Alcoholism and drug abuse on father's side     Family History: No family history on file.  Social History:    Socioeconomic History      Marital status: Single        Spouse name: Not on file   Number of children: NA   Years of education: Not on file   Highest education level: Not on file  Occupational History   Computer Programmer until 2016  Tobacco Use   Smoking status: Former      Types: Cigarettes   Smokeless tobacco: Current Vape   Tobacco comments:      quit cigarettes jan 2020  Vaping Use   Vaping Use: Every day  Substance and Sexual  Activity   Alcohol use: Not Currently      Comment: quit Oct 2019   Drug use: Not Currently      Types: Cocaine, IV, Heroin, MDMA (Ecstacy)      Comment: last reported use; cocaine and heroin in 2019; MDMA Feb 2023   Sexual activity: Not Currently  Other Topics Concern   Not on file  Social History Narrative    Living with mother now      Social Determinants of Health     Difficulty of Paying Living Expenses: Disability  Food Insecurity:    Worried About Charity fundraiser in the Last Year: On disability   Arboriculturist in the Last Year:   Transportation Needs:    Film/video editor (Medical): Uses Ecologist (Non-Medical):   Physical Activity:    Days of Exercise per Week:    Minutes of Exercise per Session:   Stress:    Feeling of Stress : Constant   Social Connections:    Frequency of Communication with Friends and Family: Mother recently kicked him out of house   Frequency of Social Gatherings with Friends and Family: Lives alone-   Attends Religious Services: No   Active Member of Clubs or Organizations: Restarting active with AA /NA        Social Connections: Mother/Computer/Had started AA until breakdown     Allergies:   Allergies  Allergen Reactions   Ingrezza [Valbenazine Tosylate] Other (See Comments)    Severe tongue chewing at 80 mg Tolerates lower doses   Naloxone Palpitations    Pt report MAT Rx is Subutex/Buprenorphine only   Amphetamine-Dextroamphetamine Other (See Comments)    unknown    Metabolic Disorder Labs: Lab Results  Component Value Date   HGBA1C 5.0 10/03/2021   MPG 96.8 10/03/2021   No results found for: "PROLACTIN" Lab Results  Component Value Date   CHOL 191 10/03/2021   TRIG 92 10/03/2021   HDL 48 10/03/2021   CHOLHDL 4.0 10/03/2021   VLDL 18 10/03/2021   LDLCALC 125 (H) 10/03/2021   Lab Results  Component Value Date   TSH 3.257 06/22/2022   TSH 1.500 02/04/2022    Therapeutic Level Labs:NA   Current Medications: Current Outpatient Medications  Medication Sig Dispense Refill   benztropine (COGENTIN) 2 MG tablet Take 1 tablet (2 mg total) by mouth 2 (two) times daily. 60 tablet 2   buprenorphine (SUBUTEX) 8 MG SUBL SL tablet Place 8 mg under the tongue 3 (three) times daily.     buPROPion (WELLBUTRIN XL) 150 MG 24 hr tablet Take 1 tablet in am daily for 2 weeks then increase to 2 tablets in AM Stop Remeron 45 tablet 0   diphenhydrAMINE (BENADRYL) 50 MG tablet Take 2-4 tablets q6h as needed for restlessness related to Haldol (Patient taking differently: Take 25 mg by mouth every 8 (eight) hours as needed (For restlessness per patient).) 100 tablet 2   doxycycline (VIBRAMYCIN) 50 MG capsule Take 1 capsule (50 mg total) by mouth daily. 90 capsule 1   finasteride (PROSCAR) 5 MG tablet Take 1 tablet (5 mg total) by mouth daily. 30 tablet 2   Fluocinolone Acetonide 0.01 % OIL      gabapentin (NEURONTIN) 400 MG capsule Take 1 capsule (400 mg total) by mouth 2 (two) times daily for 180 doses. 180 capsule 0   haloperidol (HALDOL) 5 MG tablet Take 3 tablets (15 mg total) by mouth at bedtime.  90 tablet 2   hydrocortisone 2.5 % lotion Apply topically.     hydrOXYzine  (ATARAX) 25 MG tablet Take 25 mg by mouth in the morning and at bedtime.     levothyroxine (SYNTHROID) 150 MCG tablet Take 1 tablet (150 mcg total) by mouth daily. 30 tablet 11   metroNIDAZOLE (METROGEL) 0.75 % gel      modafinil (PROVIGIL) 200 MG tablet Take 2 tablets (400 mg total) by mouth daily. 60 tablet 1   nicotine (NICODERM CQ - DOSED IN MG/24 HOURS) 21 mg/24hr patch Place 21 mg onto the skin daily.     tamsulosin (FLOMAX) 0.4 MG CAPS capsule Take by mouth.     tamsulosin (FLOMAX) 0.4 MG CAPS capsule Take by mouth.     No current facility-administered medications for this visit.    Musculoskeletal: Strength & Muscle Tone: Telepsych visit-Grossly normal Musculoskeletal and cranial nerve inspections Gait & Station: NA Patient leans: N/A  Psychiatric Specialty Exam: Review of Systems  Constitutional:  Positive for activity change (Improved) and fatigue (Chronic). Negative for appetite change, chills, diaphoresis, fever and unexpected weight change.  HENT: Negative.    Eyes: Negative.   Respiratory: Negative.    Cardiovascular: Negative.   Gastrointestinal: Negative.   Endocrine: Negative for cold intolerance, heat intolerance, polydipsia, polyphagia and polyuria.  Genitourinary:  Negative for decreased urine volume, difficulty urinating, dysuria, enuresis, flank pain, frequency, genital sores, hematuria, penile discharge, penile pain, penile swelling, scrotal swelling, testicular pain and urgency.  Musculoskeletal: Negative.   Skin:  Positive for color change (Rosacea). Negative for pallor, rash and wound.  Allergic/Immunologic: Negative.   Neurological: Negative.   Hematological: Negative.   Psychiatric/Behavioral:  Positive for sleep disturbance. Negative for agitation, behavioral problems, confusion, decreased concentration, dysphoric mood, hallucinations, self-injury and suicidal ideas. The patient is not nervous/anxious and is not hyperactive.     There were no vitals taken  for this visit.There is no height or weight on file to calculate BMI.  General Appearance: Casual and Neat  Eye Contact:  Good  Speech:  Clear and Coherent and Normal Rate  Volume:  Normal  Mood:  Euthymic  Affect:  Appropriate and Congruent  Thought Process:  Coherent, Goal Directed, and Descriptions of Associations: Intact  Orientation:  Full (Time, Place, and Person)  Thought Content: WDL   Suicidal Thoughts:  No  Homicidal Thoughts:  No  Memory:   Affected by SUDS/Psychotic episodes   Judgement:  Impaired  Insight:  Lacking  Psychomotor Activity:  Normal  Concentration:  Concentration: Good and Attention Span: Good  Recall:  See memory  Fund of Knowledge:  WDL  Language: Good  Akathisia:  Negative  Handed:  Right  AIMS (if indicated): not done  Assets:  Communication Skills Desire for Improvement Financial Resources/Insurance Housing Resilience Social Support Talents/Skills Vocational/Educational  ADL's:  Intact  Cognition: WNL  Sleep:   PER History   Screenings: AIMS    Flowsheet Row Video Visit from 07/23/2022 in [facility] Visit from 11/27/2021 in [facility] Visit from 11/06/2021 in [facility] Visit from 10/23/2021 in [facility] Visit from 10/16/2021 in [facility]  AIMS Total Score 0 4 7 5 21       Flowsheet Row ED to Hosp-Admission (Discharged) from 06/20/2022 in [facility] ED from 06/13/2022 in [facility] at [facility] ED from  05/25/2022 in [facility] Emergency Department at [place] Low Risk No Risk No Risk        Assessment Stable and improving     and Plan: Continue medications. Decrease Haldol to 10mg  Hold wellbutrin Keep Remeron  stopped FU 1 week Soon[place] if needed   [name], PA-C 07/30/2022, 5:32 PM

## 2022-08-06 ENCOUNTER — Encounter (HOSPITAL_COMMUNITY): Payer: Self-pay | Admitting: Medical

## 2022-08-06 ENCOUNTER — Telehealth (HOSPITAL_BASED_OUTPATIENT_CLINIC_OR_DEPARTMENT_OTHER): Payer: Medicare Other | Admitting: Medical

## 2022-08-06 DIAGNOSIS — G2571 Drug induced akathisia: Secondary | ICD-10-CM

## 2022-08-06 DIAGNOSIS — F0634 Mood disorder due to known physiological condition with mixed features: Secondary | ICD-10-CM

## 2022-08-06 DIAGNOSIS — Z923 Personal history of irradiation: Secondary | ICD-10-CM

## 2022-08-06 DIAGNOSIS — Z79899 Other long term (current) drug therapy: Secondary | ICD-10-CM

## 2022-08-06 DIAGNOSIS — N401 Enlarged prostate with lower urinary tract symptoms: Secondary | ICD-10-CM

## 2022-08-06 DIAGNOSIS — E039 Hypothyroidism, unspecified: Secondary | ICD-10-CM

## 2022-08-06 DIAGNOSIS — Z91148 Patient's other noncompliance with medication regimen for other reason: Secondary | ICD-10-CM

## 2022-08-06 DIAGNOSIS — I6381 Other cerebral infarction due to occlusion or stenosis of small artery: Secondary | ICD-10-CM

## 2022-08-06 DIAGNOSIS — D320 Benign neoplasm of cerebral meninges: Secondary | ICD-10-CM

## 2022-08-06 DIAGNOSIS — I6930 Unspecified sequelae of cerebral infarction: Secondary | ICD-10-CM

## 2022-08-06 DIAGNOSIS — F060 Psychotic disorder with hallucinations due to known physiological condition: Secondary | ICD-10-CM

## 2022-08-06 DIAGNOSIS — F1910 Other psychoactive substance abuse, uncomplicated: Secondary | ICD-10-CM

## 2022-08-06 DIAGNOSIS — G250 Essential tremor: Secondary | ICD-10-CM | POA: Diagnosis not present

## 2022-08-06 DIAGNOSIS — G4710 Hypersomnia, unspecified: Secondary | ICD-10-CM

## 2022-08-06 DIAGNOSIS — I639 Cerebral infarction, unspecified: Secondary | ICD-10-CM

## 2022-08-06 DIAGNOSIS — G4730 Sleep apnea, unspecified: Secondary | ICD-10-CM

## 2022-08-06 DIAGNOSIS — Z8659 Personal history of other mental and behavioral disorders: Secondary | ICD-10-CM

## 2022-08-06 DIAGNOSIS — F1920 Other psychoactive substance dependence, uncomplicated: Secondary | ICD-10-CM | POA: Diagnosis not present

## 2022-08-06 DIAGNOSIS — N138 Other obstructive and reflux uropathy: Secondary | ICD-10-CM

## 2022-08-06 DIAGNOSIS — L719 Rosacea, unspecified: Secondary | ICD-10-CM

## 2022-08-06 DIAGNOSIS — I10 Essential (primary) hypertension: Secondary | ICD-10-CM
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
## 2022-08-13 ENCOUNTER — Encounter (HOSPITAL_COMMUNITY): Payer: Self-pay | Admitting: Medical

## 2022-08-13 ENCOUNTER — Telehealth (HOSPITAL_BASED_OUTPATIENT_CLINIC_OR_DEPARTMENT_OTHER): Payer: Medicare Other | Admitting: Medical

## 2022-08-13 DIAGNOSIS — I639 Cerebral infarction, unspecified: Secondary | ICD-10-CM

## 2022-08-13 DIAGNOSIS — F1920 Other psychoactive substance dependence, uncomplicated: Secondary | ICD-10-CM | POA: Diagnosis not present

## 2022-08-13 DIAGNOSIS — F0634 Mood disorder due to known physiological condition with mixed features: Secondary | ICD-10-CM

## 2022-08-13 DIAGNOSIS — I10 Essential (primary) hypertension: Secondary | ICD-10-CM

## 2022-08-13 DIAGNOSIS — G4710 Hypersomnia, unspecified: Secondary | ICD-10-CM

## 2022-08-13 DIAGNOSIS — G250 Essential tremor: Secondary | ICD-10-CM

## 2022-08-13 DIAGNOSIS — Z79899 Other long term (current) drug therapy: Secondary | ICD-10-CM

## 2022-08-13 DIAGNOSIS — L719 Rosacea, unspecified: Secondary | ICD-10-CM

## 2022-08-13 DIAGNOSIS — I6381 Other cerebral infarction due to occlusion or stenosis of small artery: Secondary | ICD-10-CM | POA: Diagnosis not present

## 2022-08-13 DIAGNOSIS — Z923 Personal history of irradiation: Secondary | ICD-10-CM

## 2022-08-13 DIAGNOSIS — G2571 Drug induced akathisia: Secondary | ICD-10-CM

## 2022-08-13 DIAGNOSIS — F1910 Other psychoactive substance abuse, uncomplicated: Secondary | ICD-10-CM

## 2022-08-13 DIAGNOSIS — Z8659 Personal history of other mental and behavioral disorders: Secondary | ICD-10-CM

## 2022-08-13 DIAGNOSIS — D320 Benign neoplasm of cerebral meninges: Secondary | ICD-10-CM

## 2022-08-13 DIAGNOSIS — N138 Other obstructive and reflux uropathy: Secondary | ICD-10-CM

## 2022-08-13 DIAGNOSIS — G4730 Sleep apnea, unspecified: Secondary | ICD-10-CM

## 2022-08-13 DIAGNOSIS — E039 Hypothyroidism, unspecified: Secondary | ICD-10-CM

## 2022-08-13 DIAGNOSIS — F060 Psychotic disorder with hallucinations due to known physiological condition: Secondary | ICD-10-CM

## 2022-08-13 DIAGNOSIS — N401 Enlarged prostate with lower urinary tract symptoms: Secondary | ICD-10-CM

## 2022-08-13 MED ORDER — HALOPERIDOL 2 MG PO TABS: ORAL_TABLET | ORAL | 0 refills | Status: DC

## 2022-08-13 MED ORDER — METRONIDAZOLE 1 % EX GEL: Freq: Every day | CUTANEOUS | 0 refills | Status: DC

## 2022-08-13 NOTE — Progress Notes (Signed)
[facility] MD/PA/NP OP Progress Note  4/18/20243:33 PM AAKASH Bray  MRN:  [id number] Virtual Visit via Video Note  I connected with Paul Bray on 08/19/22 at  3:00 PM EDT by a video enabled telemedicine application and verified that I am speaking with the correct person using two identifiers.  Location: Patient: Home Provider: [facility] [place]   I discussed the limitations of evaluation and management by telemedicine and the availability of in person appointments. The patient expressed understanding and agreed to proceed.   History of Present Illness:See EPIC note    Observations/Objective:See EPIC note   Assessment and Plan:See EPIC note   Follow Up Instructions:See EPIC note   I discussed the assessment and treatment plan with the patient. The patient was provided an opportunity to ask questions and all were answered. The patient agreed with the plan and demonstrated an understanding of the instructions.   The patient was advised to call back or seek an in-person evaluation if the symptoms worsen or if the condition fails to improve as anticipated.  I provided 20 minutes of non-face-to-face time during this encounter.   [name], PA-C   Chief Complaint:  Chief Complaint  Patient presents with   Follow-up   Addiction Problem   Medication Management   Lt basilar infarctions (MRI finding)   Post Stroke Hallucinosis   Rt supratentorial Meningiome S/P Gamma radiation   Rosacea   HPI: Paul Bray returns today for his weekly FU on psychotic break most likely post stroke and responsive to Haldol previously. This recent breakdown in February started with his stopping low dose Haldol and going to medicine cabinet and abusing Suboxone and Modanafil. He reports renwed energy with exercise and return to his [employer] computer site. He is urinating freely now. 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 Sleep study will have to wait along with FU Thyroid panel.He  wants to lower his Haldol. He does not request more Modasnafil this week. He does not c/o tremor  Visit Diagnosis:    ICD-10-CM   1. Multiple lacunar infarcts  I63.81     2. Infarction of left basal ganglia  I63.9    FOUND ON MRI 2020    3. Polysubstance dependence  F19.20     4. Hypersomnia with sleep apnea  G47.10    G47.30     5. Drug abuse, IV  F19.10     6. 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 Meningioma, cerebral  D32.0     8. Status post gamma knife treatment  Z92.3     9. Benign localized hyperplasia of prostate with urinary obstruction  N40.1    N13.8     10. Familial tremor  G25.0     11. Hypothyroidism, unspecified type  E03.9     12. History of ADHD  Z86.59     13. 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 Drug induced akathisia  G25.71     15. Acquired hypothyroidism  E03.9     16. Rosacea  L71.9     17. Essential hypertension  I10     18. Medication management  Z79.899       Past Psychiatric History:  Care Everywhere reviewed-No New Info   [facility] Date of recent admission: 10/29/21 Date of recent discharge: 11/01/21 [facility]  [facility]  OP [place] 6/17/-03/04/2020  Pt with Opiate dependence MAT Suboxone at [facility] Dr Rayna Sexton C/O of paranoia he is aware of but cant stop/completely control.Has rx for Risperdal from [employer]. Also hx of Gamma Knife tradiation fo benign brain tumor ?2018 Says has records Requesting Psychiatrist/medication mangement for paranoia hx [facility] OP [place] 08/21/2021 - NOW [facility] INPATIENT  Date of Admission:  10/02/2021 Date of Discharge: 10/13/2021  Date of Admission:06/13/2022 [facility] ED)  Date of Discharge:06/16/22  Date of Admission: 06/25/2022  Date of Discharge:06/29/2022   [name] 05/27/2022- 06/03/2022  [place] Alcohol intoxication 03/26/2017  Alcohol withdrawal delirium  03/26/2017  Chronic daily headache 02/25/2017  Alcohol use disorder, severe, dependence 02/22/2017  Heroin dependence MAT Methadone Metro/Subutex TBR 01/2018  Anxiety disorder, unspecified 01/20/2017  Paranoia 03/2018    [facility] Jacklynn Bue, NP - 04/12/2017 12:38 PM EST Formatting of this note might be different from the original. [facility] Psychiat[facility] - Substance Abuse PHP/IOP Intake Assessment  BH Formulate an individual and appropriate relapse prevention plan.   BH SU Problem: Formation of a relapse prevention plan    [facility] Uncomplicated opioid dependence (CMS/HCC) 12/24/2017  1. Discharge from hospital. 2. Follow up at [facility]   Past Medical History:  Past Medical History:  Diagnosis Date   ADHD    Anxiety    Brain tumor    balance and occ memory issues and headaches   Brain tumor    sees [facility] q year   Depression    Headache    migraine and tension   Hypothyroidism    Soft tissue mass    left shoulder   Substance abuse     Past Surgical History:  Procedure Laterality Date   colonscopy     gamma knife radiation 2018 for brain tumor'     hematoma removed from arm Left    MASS EXCISION Left 06/30/2019   Procedure: EXCISION OF SOFT TISSUE  MASS LEFT SHOULDER;  Surgeon: Darnell Level, MD;  Location: [facility];  Service: General;  Laterality: Left;  LMA    Family Psychiatric History: Alcoholism and drug abuse on father's side     Family History:   Hypertension Mother   Hypertension/Alcoholism/Addiction Father   Social History:  Social History   Socioeconomic History   Marital status: Single    Spouse name: Not on file   Number of children: Not on file   Years of education: Not on file   Highest education level: Not on file  Occupational History   Not on file  Tobacco Use   Smoking status: Former    Types: Cigarettes   Smokeless tobacco: Current   Tobacco comments:    quit jan 2020   Vaping Use   Vaping Use: Every day  Substance and Sexual Activity   Alcohol use: Not Currently    Comment: quit Oct 2019   Drug use: Not Currently    Types: Cocaine, IV, Heroin, MDMA (Ecstacy)    Comment: last reported use; cocaine and heroin in 2019; MDMA Feb 2023   Sexual activity: Not Currently  Other Topics Concern   Not on file  Social History Narrative   Not on file  Social Determinants of Health   Difficulty of Paying Living Expenses: Disability  Food Insecurity:    Worried About Running Out of Food in the Last Year: On disability   Ran Out of Food in the Last Year:   Transportation Needs:  Lack of Transportation (Medical): Uses Consulting civil engineer (Non-Medical):   Physical Activity:    Days of Exercise per Week:    Minutes of Exercise per Session:   Stress:    Feeling of Stress : Constant   Social Connections:    Frequency of Communication with Friends and Family: Mother recently kicked him out of house   Frequency of Social Gatherings with Friends and Family: Lives alone-   Attends Religious Services: No   Active Member of Clubs or Organizations: Restarting active with AA /NA    Social Connections: Mother/Computer/Had started AA until breakdown     Allergies:  Allergies  Allergen Reactions   Ingrezza [Valbenazine Tosylate] Other (See Comments)    Severe tongue chewing at 80 mg Tolerates lower doses   Naloxone Palpitations    Pt report MAT Rx is Subutex/Buprenorphine only   Amphetamine-Dextroamphetamine Other (See Comments)    unknown    Metabolic Disorder Labs: Lab Results  Component Value Date   HGBA1C 5.0 10/03/2021   MPG 96.8 10/03/2021   No results found for: "PROLACTIN" Lab Results  Component Value Date   CHOL 191 10/03/2021   TRIG 92 10/03/2021   HDL 48 10/03/2021   CHOLHDL 4.0 10/03/2021   VLDL 18 10/03/2021   LDLCALC 125 (H) 10/03/2021   Lab Results  Component Value Date   TSH 3.257 06/22/2022   TSH 1.500 02/04/2022     Therapeutic Level Labs:NA  Current Medications: Current Outpatient Medications  Medication Sig Dispense Refill   haloperidol (HALDOL) 2 MG tablet Take 2 tablets with 1  tablet daily 60 tablet 0   metroNIDAZOLE (METROGEL) 1 % gel Apply topically daily. 45 g 0   benztropine (COGENTIN) 2 MG tablet Take 1 tablet (2 mg total) by mouth 2 (two) times daily. 60 tablet 2   buprenorphine (SUBUTEX) 8 MG SUBL SL tablet Place 8 mg under the tongue 3 (three) times daily.     buPROPion (WELLBUTRIN XL) 150 MG 24 hr tablet Take 1 tablet in am daily for 2 weeks then increase to 2 tablets in AM Stop Remeron 45 tablet 0   diphenhydrAMINE (BENADRYL) 50 MG tablet Take 2-4 tablets q6h as needed for restlessness related to Haldol (Patient taking differently: Take 25 mg by mouth every 8 (eight) hours as needed (For restlessness per patient).) 100 tablet 2   doxycycline (VIBRAMYCIN) 50 MG capsule Take 1 capsule (50 mg total) by mouth daily. 90 capsule 1   finasteride (PROSCAR) 5 MG tablet Take 1 tablet (5 mg total) by mouth daily. 30 tablet 2   Fluocinolone Acetonide 0.01 % OIL      gabapentin (NEURONTIN) 400 MG capsule Take 1 capsule (400 mg total) by mouth 2 (two) times daily for 180 doses. 180 capsule 0   haloperidol (HALDOL) 5 MG tablet Take 3 tablets (15 mg total) by mouth at bedtime. 90 tablet 2   hydrOXYzine (ATARAX) 25 MG tablet Take 25 mg by mouth in the morning and at bedtime.     levothyroxine (SYNTHROID) 150 MCG tablet Take 1 tablet (150 mcg total) by mouth daily. 30 tablet 11   metroNIDAZOLE (METROGEL) 0.75 % gel      modafinil (PROVIGIL) 200 MG tablet Take 2 tablets (400 mg total) by mouth daily. 60 tablet 1   nicotine (NICODERM CQ - DOSED IN MG/24 HOURS) 21 mg/24hr patch Place 21 mg onto the skin daily.     tamsulosin (FLOMAX) 0.4 MG CAPS capsule Take by mouth.  tamsulosin (FLOMAX) 0.4 MG CAPS capsule Take by mouth.     No current facility-administered medications for this visit.      MMusculoskeletal: Strength & Muscle Tone: Telepsych visit-Grossly normal Musculoskeletal and cranial nerve inspections Gait & Station: NA Patient leans: N/A  Psychiatric Specialty Exam: Review of Systems  Constitutional:  Positive for activity change (Increasing physical and mental activity). Negative for appetite change, chills, diaphoresis, fatigue, fever and unexpected weight change.  Respiratory: Negative.    Cardiovascular: Negative.   Endocrine: Negative for cold intolerance, heat intolerance, polydipsia, polyphagia and polyuria.       Hypothroid  Genitourinary:  Negative for decreased urine volume, difficulty urinating, dysuria, enuresis, flank pain, frequency, genital sores, hematuria, penile discharge, penile pain, penile swelling, scrotal swelling, testicular pain and urgency.  Neurological:  Negative for dizziness, tremors (Familia tremorDenies Akahtasia today), seizures, syncope, facial asymmetry, speech difficulty, weakness, light-headedness, numbness and headaches.  Psychiatric/Behavioral:  Positive for sleep disturbance. Negative for agitation, behavioral problems, confusion, decreased concentration, dysphoric mood, hallucinations and self-injury. The patient is not nervous/anxious and is not hyperactive.     There were no vitals taken for this visit.There is no height or weight on file to calculate BMI.MY CHART VISIT  General Appearance: Casual and Neat  Eye Contact:  Good  Speech:  Clear and Coherent and Normal Rate  Volume:  Normal  Mood:  Euthymic  Affect:  Appropriate and Congruent  Thought Process:  Coherent, Goal Directed, and Descriptions of Associations: Intact  Orientation:  Full (Time, Place, and Person)  Thought Content: WDL and Logical   Suicidal Thoughts:  No  Homicidal Thoughts:  No  Memory:   Affected by SUDS/Psychotic episodes   Judgement:  Fair  Insight:  Fair  Psychomotor Activity:  Negative  Concentration:  Concentration: Good and Attention  Span: Good  Recall:  Fair  Fund of Knowledge:  WDL  Language: Good  Akathisia:  No  Handed:  Right  AIMS (if indicated): Grossly normal  Assets:  Desire for Improvement Financial Resources/Insurance Housing Resilience Social Support Talents/Skills Vocational/Educational  ADL's:  Intact  Cognition: Impaired,  Moderate  Sleep:   Impaired   Screenings: AIMS    Flowsheet Row Video [facility]/20[facility] in [facility] Office Visit from 11[facility]27/2021 in [facility] Office Visit from 11/06/2021 in [facility] Office Visit from 10[facility]23/2021 in [facility] Office Visit from 10/16/2021 in [facility]  AIMS Total Score 0 4 7 5 21       Flowsheet Row ED to Hosp-Admission (Discharged) from 06/20/2022 in [facility]  SURGICAL ED from 06/13/2022 in [facility] Department at [facility] ED from 05/25/2022 in [facility] Emergency Department at [facility]  C-SSRS RISK CATEGORY Low Risk No Risk No Risk        Assessment : Marked improvement    and Plan: Continue current plan with 1 mg reduction in Haldol to 9mg  Mother informed   [name], [place] 4/18/20243:33 PM

## 2022-08-20 ENCOUNTER — Telehealth (HOSPITAL_COMMUNITY): Payer: Medicare Other | Admitting: Medical

## 2022-08-27 ENCOUNTER — Encounter (HOSPITAL_COMMUNITY): Payer: Self-pay | Admitting: Medical

## 2022-08-27 ENCOUNTER — Telehealth (HOSPITAL_BASED_OUTPATIENT_CLINIC_OR_DEPARTMENT_OTHER): Payer: Medicare Other | Admitting: Medical

## 2022-08-27 DIAGNOSIS — I10 Essential (primary) hypertension: Secondary | ICD-10-CM

## 2022-08-27 DIAGNOSIS — G4730 Sleep apnea, unspecified: Secondary | ICD-10-CM

## 2022-08-27 DIAGNOSIS — F1920 Other psychoactive substance dependence, uncomplicated: Secondary | ICD-10-CM | POA: Diagnosis not present

## 2022-08-27 DIAGNOSIS — Z79899 Other long term (current) drug therapy: Secondary | ICD-10-CM

## 2022-08-27 DIAGNOSIS — F1910 Other psychoactive substance abuse, uncomplicated: Secondary | ICD-10-CM

## 2022-08-27 DIAGNOSIS — G2571 Drug induced akathisia: Secondary | ICD-10-CM

## 2022-08-27 DIAGNOSIS — Z923 Personal history of irradiation: Secondary | ICD-10-CM

## 2022-08-27 DIAGNOSIS — I6381 Other cerebral infarction due to occlusion or stenosis of small artery: Secondary | ICD-10-CM | POA: Diagnosis not present

## 2022-08-27 DIAGNOSIS — D320 Benign neoplasm of cerebral meninges: Secondary | ICD-10-CM

## 2022-08-27 DIAGNOSIS — F0634 Mood disorder due to known physiological condition with mixed features: Secondary | ICD-10-CM

## 2022-08-27 DIAGNOSIS — I639 Cerebral infarction, unspecified: Secondary | ICD-10-CM | POA: Diagnosis not present

## 2022-08-27 DIAGNOSIS — G4710 Hypersomnia, unspecified: Secondary | ICD-10-CM | POA: Diagnosis not present

## 2022-08-27 DIAGNOSIS — N138 Other obstructive and reflux uropathy: Secondary | ICD-10-CM

## 2022-08-27 DIAGNOSIS — N401 Enlarged prostate with lower urinary tract symptoms: Secondary | ICD-10-CM

## 2022-08-27 DIAGNOSIS — F060 Psychotic disorder with hallucinations due to known physiological condition: Secondary | ICD-10-CM

## 2022-08-27 DIAGNOSIS — E039 Hypothyroidism, unspecified: Secondary | ICD-10-CM

## 2022-08-27 DIAGNOSIS — Z8659 Personal history of other mental and behavioral disorders: Secondary | ICD-10-CM

## 2022-08-27 DIAGNOSIS — L719 Rosacea, unspecified: Secondary | ICD-10-CM

## 2022-08-27 DIAGNOSIS — G250 Essential tremor: Secondary | ICD-10-CM

## 2022-08-27 NOTE — Progress Notes (Signed)
BH MD/PA/NP OP Progress Note  08/27/2022 3:24 PM Paul Bray  MRN:  [id number] Virtual Visit via Video Note  I connected with Paul Bray on 08/27/22 at  3:00 PM EDT by a video enabled telemedicine application and verified that I am speaking with the correct person using two identifiers.  Location: Patient: Home Provider: [facility] [place]   I discussed the limitations of evaluation and management by telemedicine and the availability of in person appointments. The patient expressed understanding and agreed to proceed.   History of Present Illness:See EPIC note    Observations/Objective:See EPIC note   Assessment and Plan:See EPIC note   Follow Up Instructions:See EPIC note I discussed the assessment and treatment plan with the patient. The patient was provided an opportunity to ask questions and all were answered. The patient agreed with the plan and demonstrated an understanding of the instructions.   The patient was advised to call back or seek an in-person evaluation if the symptoms worsen or if the condition fails to improve as anticipated.  I provided 20 minutes of non-face-to-face time during this encounter.   Paul Morn, PA-C   Chief Complaint:  Chief Complaint  Patient presents with   Follow-up   Addiction Problem   Lt basilar infarcts   Post stroke Psychosis   Medication Management   HPI: Paul Bray returns for 2 week FU and monitoring of his medications and mental status after multiple episodes of Psychosis triggered by his substance dependency /use of addictive drugs -the last having occurred after he had finally been freed from his hallucinosis by Haldol. The relapse (fortunately) again responded to Haldol to the point that at his last 2 visits he shows no evidence of any of this history.  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 He is exercising daily. His Mom had TKR 4/24 and he is helping her recover at their home. (She is doing well he  reports-"lots of ice") He is not going to any support meetings unfortunately but so far is firm in not using mind/mood altering substances.  His tremors have abated. He is urinating freely and has a FU Ultasound later this month in anticipation of a biopsy.(Psychologically he admits he still feels some hesitancy but no obstructive symptoms) Medications are as listed and he has no problems with any. He has enough to last until next visit in 2 weeks.  Visit Diagnosis:    ICD-10-CM   1. Polysubstance dependence (HCC)  F19.20     2. Multiple lacunar infarcts (HCC)  I63.81     3. Infarction of left basal ganglia (HCC)  I63.9     4. Hypersomnia with sleep apnea  G47.10    G47.30     5. Drug abuse, IV (HCC)  F19.10     6. 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 Meningioma, cerebral (HCC)  D32.0     8. Status post gamma knife treatment  Z92.3     9. Benign localized hyperplasia of prostate with urinary obstruction  N40.1    N13.8     10. Familial tremor  G25.0     11. Hypothyroidism, unspecified type  E03.9     12. History of ADHD  Z86.59     13. 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 Drug induced akathisia  G25.71    Resolved  15. Rosacea  L71.9     16. Essential hypertension  I10     17. Medication management  Z79.899       Past Psychiatric History:   [facility] Date of recent admission: 10/29/21 Date of recent discharge: 11/01/21 [facility]  [facility] OP [place] 6/17/-03/04/2020  Pt with Opiate dependence MAT Suboxone at [facility] Dr Rayna Sexton C/O of paranoia he is aware of but cant stop/completely control.Has rx for Risperdal from [employer]. Also hx of Gamma Knife tradiation fo benign brain tumor ?2018 Says has records Requesting Psychiatrist/medication mangement for paranoia hx [facility] OP [place] 08/21/2021 - NOW [facility] INPATIENT   Date of Admission:  10/02/2021 Date of Discharge: 10/13/2021  Date of Admission:06/13/2022 [facility] ED)  Date of Discharge:06/16/22  Date of Admission: 06/25/2022  Date of Discharge:06/29/2022   [name] 05/27/2022- 06/03/2022   [place] Alcohol intoxication 03/26/2017  Alcohol withdrawal delirium 03/26/2017  Chronic daily headache 02/25/2017  Alcohol use disorder, severe, dependence 02/22/2017  Heroin dependence MAT Methadone [place]/Subutex TBR 01/2018  Anxiety disorder, unspecified 01/20/2017  Paranoia 03/2018    [facility] Jacklynn Bue, NP - 04/12/2017 12:38 PM EST Formatting of this note might be different from the original. [facility] - Substance Abuse PHP/IOP Intake Assessment  BH Formulate an individual and appropriate relapse prevention plan.   BH SU Problem: Formation of a relapse prevention plan    [facility] Uncomplicated opioid dependence (CMS/HCC) 12/24/2017  1. Discharge from hospital. 2. Follow up at [facility]   Past Medical History:  Past Medical History:  Diagnosis Date   ADHD    Anxiety    Brain tumor (HCC)    balance and occ memory issues and headaches   Brain tumor (HCC)    sees [facility] q year   Depression    Headache    migraine and tension   Hypothyroidism    Soft tissue mass    left shoulder   Substance abuse (HCC)     Past Surgical History:  Procedure Laterality Date   colonscopy     gamma knife radiation 2018 for brain tumor'     hematoma removed from arm Left    MASS EXCISION Left 06/30/2019   Procedure: EXCISION OF SOFT TISSUE  MASS LEFT SHOULDER;  Surgeon: Darnell Level, MD;  Location: [facility];  Service: General;  Laterality: Left;  LMA    Family Psychiatric History:  Alcoholism and drug abuse on father's side   Family History:  Hypertension Mother   Hypertension/Alcoholism/Addiction Father   Social History:  Social History   Socioeconomic History    Marital status:  Single      Spouse name: Not on file   Number of children: Not on file   Years of education: Not on file   Highest education level: Not on file  Occupational History   Not on file  Tobacco Use   Smoking status: Former      Types: Cigarettes   Smokeless tobacco: Current   Tobacco comments:      quit jan 2020  Vaping Use   Vaping Use: Every day  Substance and Sexual Activity   Alcohol use: Not Currently      Comment: quit Oct 2019   Drug use: Not Currently      Types: Cocaine, IV, Heroin, MDMA (Ecstacy)      Comment: last reported use; cocaine and heroin in 2019; MDMA Feb 2023   Sexual activity: Not  Currently  Other Topics Concern   Not on file  Social History Narrative   Not on file  Social Determinants of Health   Difficulty of Paying Living Expenses: Disability  Food Insecurity:    Worried About Running Out of Food in the Last Year: On disability   Ran Out of Food in the Last Year:   Transportation Needs:    Freight forwarder (Medical): Uses Consulting civil engineer (Non-Medical):   Physical Activity:    Days of Exercise per Week:    Minutes of Exercise per Session:   Stress:    Feeling of Stress : Constant   Social Connections:    Frequency of Communication with Friends and Family: Mother recently kicked him out of house   Frequency of Social Gatherings with Friends and Family: Lives alone-   Attends Religious Services: No   Active Member of Clubs or Organizations: Restarting active with AA /NA    Social Connections: Mother/Computer/Had started AA until breakdown      Allergies:  Allergies  Allergen Reactions   Ingrezza [Valbenazine Tosylate] Other (See Comments)    Severe tongue chewing at 80 mg Tolerates lower doses   Naloxone Palpitations    Pt report MAT Rx is Subutex/Buprenorphine only   Amphetamine-Dextroamphetamine Other (See Comments)    unknown    Metabolic Disorder Labs: Lab Results  Component Value Date   HGBA1C 5.0 10/03/2021    MPG 96.8 10/03/2021   No results found for: "PROLACTIN" Lab Results  Component Value Date   CHOL 191 10/03/2021   TRIG 92 10/03/2021   HDL 48 10/03/2021   CHOLHDL 4.0 10/03/2021   VLDL 18 10/03/2021   LDLCALC 125 (H) 10/03/2021   Lab Results  Component Value Date   TSH 3.257 06/22/2022   TSH 1.500 02/04/2022    Therapeutic Level Labs:NA  Current Medications: Current Outpatient Medications  Medication Sig Dispense Refill   benztropine (COGENTIN) 2 MG tablet Take 1 tablet (2 mg total) by mouth 2 (two) times daily. 60 tablet 2   buprenorphine (SUBUTEX) 8 MG SUBL SL tablet Place 8 mg under the tongue 3 (three) times daily.     buPROPion (WELLBUTRIN XL) 150 MG 24 hr tablet Take 1 tablet in am daily for 2 weeks then increase to 2 tablets in AM Stop Remeron 45 tablet 0   diphenhydrAMINE (BENADRYL) 50 MG tablet Take 2-4 tablets q6h as needed for restlessness related to Haldol (Patient taking differently: Take 25 mg by mouth every 8 (eight) hours as needed (For restlessness per patient).) 100 tablet 2   doxycycline (VIBRAMYCIN) 50 MG capsule Take 1 capsule (50 mg total) by mouth daily. 90 capsule 1   finasteride (PROSCAR) 5 MG tablet Take 1 tablet (5 mg total) by mouth daily. 30 tablet 2   Fluocinolone Acetonide 0.01 % OIL      gabapentin (NEURONTIN) 400 MG capsule Take 1 capsule (400 mg total) by mouth 2 (two) times daily for 180 doses. 180 capsule 0   haloperidol (HALDOL) 2 MG tablet Take 2 tablets with 1 5mg  tablet daily 60 tablet 0   haloperidol (HALDOL) 5 MG tablet Take 3 tablets (15 mg total) by mouth at bedtime. 90 tablet 2   hydrOXYzine (ATARAX) 25 MG tablet Take 25 mg by mouth in the morning and at bedtime.     levothyroxine (SYNTHROID) 150 MCG tablet Take 1 tablet (150 mcg total) by mouth daily. 30 tablet 11   metroNIDAZOLE (METROGEL) 0.75 % gel  metroNIDAZOLE (METROGEL) 1 % gel Apply topically daily. 45 g 0   modafinil (PROVIGIL) 200 MG tablet Take 2 tablets (400 mg  total) by mouth daily. 60 tablet 1   nicotine (NICODERM CQ - DOSED IN MG/24 HOURS) 21 mg/24hr patch Place 21 mg onto the skin daily.     tamsulosin (FLOMAX) 0.4 MG CAPS capsule Take by mouth.     tamsulosin (FLOMAX) 0.4 MG CAPS capsule Take by mouth.     No current facility-administered medications for this visit.   Musculoskeletal: Strength & Muscle Tone: Telepsych visit-Grossly normal Musculoskeletal and cranial nerve inspections Gait & Station: NA Patient leans: N/A  Psychiatric Specialty Exam: Review of Systems  Constitutional:  Positive for activity change (Continues to improve). Negative for appetite change, chills, diaphoresis, fatigue, fever and unexpected weight change.  HENT: Negative.    Respiratory:  Positive for cough (Smoker (Vapes)). Negative for apnea, choking, chest tightness, shortness of breath, wheezing and stridor.   Cardiovascular:  Negative for chest pain, palpitations and leg swelling.  Gastrointestinal: Negative.   Genitourinary:  Positive for difficulty urinating (Resolving-in process for ?biopsy-has [place] later this month). Negative for decreased urine volume, dysuria, enuresis, flank pain, frequency, genital sores, hematuria, penile discharge, penile pain, penile swelling, scrotal swelling, testicular pain and urgency.  Neurological:  Negative for dizziness, tremors (none now), seizures, syncope, facial asymmetry, speech difficulty, weakness, light-headedness, numbness and headaches.  Hematological: Negative.   Psychiatric/Behavioral:  Positive for sleep disturbance (No complaints !). Negative for agitation, behavioral problems, confusion, decreased concentration, dysphoric mood, hallucinations, self-injury and suicidal ideas. The patient is not nervous/anxious and is not hyperactive.     There were no vitals taken for this visit.There is no height or weight on file to calculate BMI.My Chart visit  General Appearance: Casual and Neat  Eye Contact:  Good  Speech:   Clear and Coherent and Normal Rate  Volume:  Normal  Mood:  Euthymic  Affect:  Appropriate and Congruent  Thought Process:  Coherent, Goal Directed, and Descriptions of Associations: Intact  Orientation:  Full (Time, Place, and Person)  Thought Content: WDL and Logical   Suicidal Thoughts:  No  Homicidal Thoughts:  No  Memory:   Affected by SUDS/Psychotic episodes   Judgement:  Other:  Improved-still susceptible to substance abuse  Insight:  Fair  Psychomotor Activity:  Normal  Concentration:  Concentration: Good and Attention Span: Good  Recall:   See memory  Fund of Knowledge:  WDL  Language: Good  Akathisia:  No  Handed:  Right  AIMS (if indicated): not done grossly undetectable  Assets:  Desire for Improvement Financial Resources/Insurance Housing Resilience Social Support Talents/Skills  ADL's:  Intact  Cognition: Impaired,  Moderate  Sleep:   No complaint today   Screenings: AIMS    Flowsheet Row Video [facility] from [facility]20[facility] in [facility] Office Visit from 11[facility]27/2021 in [facility] Office Visit from 11/06/2021 in [facility] Office Visit from 10[facility]23/2021 in [facility] Office Visit from 10/16/2021 in [facility]  AIMS Total Score 0 4 7 5 21       Flowsheet Row ED to Hosp-Admission (Discharged) from 06/20/2022 in [facility]  SURGICAL ED from 06/13/2022 in [facility] Emergency Department [facility] ED from 05/25/2022 in [facility] Emergency Department [facility]  C-SSRS RISK CATEGORY Low Risk No Risk No Risk  Assessment ;Continues to improve  and Plan: Continue current meds/home activities FU 2 weeks-sooner if needed.  / Paul Morn, PA-C 08/27/2022, 3:24 PM

## 2022-09-10 ENCOUNTER — Encounter (HOSPITAL_COMMUNITY): Payer: Self-pay | Admitting: Medical

## 2022-09-10 ENCOUNTER — Telehealth (HOSPITAL_BASED_OUTPATIENT_CLINIC_OR_DEPARTMENT_OTHER): Payer: Medicare Other | Admitting: Medical

## 2022-09-10 DIAGNOSIS — F1910 Other psychoactive substance abuse, uncomplicated: Secondary | ICD-10-CM

## 2022-09-10 DIAGNOSIS — F1920 Other psychoactive substance dependence, uncomplicated: Secondary | ICD-10-CM

## 2022-09-10 DIAGNOSIS — E039 Hypothyroidism, unspecified: Secondary | ICD-10-CM

## 2022-09-10 DIAGNOSIS — F0634 Mood disorder due to known physiological condition with mixed features: Secondary | ICD-10-CM

## 2022-09-10 DIAGNOSIS — N138 Other obstructive and reflux uropathy: Secondary | ICD-10-CM

## 2022-09-10 DIAGNOSIS — I6381 Other cerebral infarction due to occlusion or stenosis of small artery: Secondary | ICD-10-CM

## 2022-09-10 DIAGNOSIS — G4710 Hypersomnia, unspecified: Secondary | ICD-10-CM

## 2022-09-10 DIAGNOSIS — F060 Psychotic disorder with hallucinations due to known physiological condition: Secondary | ICD-10-CM

## 2022-09-10 DIAGNOSIS — D320 Benign neoplasm of cerebral meninges: Secondary | ICD-10-CM

## 2022-09-10 DIAGNOSIS — Z923 Personal history of irradiation: Secondary | ICD-10-CM

## 2022-09-10 DIAGNOSIS — L719 Rosacea, unspecified: Secondary | ICD-10-CM

## 2022-09-10 DIAGNOSIS — I10 Essential (primary) hypertension: Secondary | ICD-10-CM

## 2022-09-10 DIAGNOSIS — Z79899 Other long term (current) drug therapy: Secondary | ICD-10-CM

## 2022-09-10 DIAGNOSIS — N401 Enlarged prostate with lower urinary tract symptoms: Secondary | ICD-10-CM

## 2022-09-10 DIAGNOSIS — I639 Cerebral infarction, unspecified: Secondary | ICD-10-CM

## 2022-09-10 DIAGNOSIS — Z8659 Personal history of other mental and behavioral disorders: Secondary | ICD-10-CM

## 2022-09-10 DIAGNOSIS — G4730 Sleep apnea, unspecified: Secondary | ICD-10-CM

## 2022-09-10 DIAGNOSIS — G250 Essential tremor: Secondary | ICD-10-CM

## 2022-09-10 MED ORDER — GABAPENTIN 400 MG PO CAPS: 400.0000 mg | ORAL_CAPSULE | Freq: Two times a day (BID) | ORAL | 0 refills | Status: DC

## 2022-09-10 MED ORDER — MODAFINIL 200 MG PO TABS: 400.0000 mg | ORAL_TABLET | Freq: Every day | ORAL | 1 refills | Status: DC

## 2022-09-10 MED ORDER — HALOPERIDOL 2 MG PO TABS: ORAL_TABLET | ORAL | 0 refills | Status: DC

## 2022-09-10 NOTE — Progress Notes (Signed)
BH MD/PA/NP OP Progress Note  09/10/2022 4:06 PM Paul Bray  MRN:  [id number] Virtual Visit via Video Note  I connected with Paul Bray on 09/10/22 at  3:30 PM EDT by a video enabled telemedicine application and verified that I am speaking with the correct person using two identifiers.  Location: Patient: At home Provider: [facility] [place]   I discussed the limitations of evaluation and management by telemedicine and the availability of in person appointments. The patient expressed understanding and agreed to proceed.   History of Present Illness:See EPIC note    Observations/Objective:See EPIC note   Assessment and Plan:See EPIC note   Follow Up Instructions:See EPIC note   I discussed the assessment and treatment plan with the patient. The patient was provided an opportunity to ask questions and all were answered. The patient agreed with the plan and demonstrated an understanding of the instructions.   The patient was advised to call back or seek an in-person evaluation if the symptoms worsen or if the condition fails to improve as anticipated.  I provided 20 minutes of non-face-to-face time during this encounter.   [name], PA-C   Chief Complaint:  Chief Complaint  Patient presents with   Follow-up   Addiction Problem   Post stroke Psychosis   Altered Mental Status   Family Problem   HPI: Paul Bray returns for 2 week FU  and monitoring of his medications and mental status after multiple episodes of Psychosis triggered by his substance dependency /use of addictive drugs -the last having occurred after he had finally been freed from his hallucinosis by Haldol. The relapse (fortunately) again responded to Haldol He remains psychosis free now at 9 mg and his [name] is gone with Cogentin 2 mg. He is now active physically and mentally . 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 He remains abstinence from all substances with the aid of MAT Suboxone. Prostate  evaluation continues. He is going to get his Thyroid panel updated when he goes with Mom for her FU next week  Visit Diagnosis:    ICD-10-CM   1. Polysubstance dependence (HCC)  F19.20     2. Multiple lacunar infarcts (HCC)  I63.81     3. Infarction of left basal ganglia (HCC)  I63.9     4. Hypersomnia with sleep apnea  G47.10    G47.30     5. Drug abuse, IV (HCC)  F19.10     6. 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 Meningioma, cerebral (HCC)  D32.0     8. Status post gamma knife treatment  Z92.3     9. Benign localized hyperplasia of prostate with urinary obstruction  N40.1    N13.8     10. Familial tremor  G25.0     11. Hypothyroidism, unspecified type  E03.9     12. History of ADHD  Z86.59     13. 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 Rosacea  L71.9     15. Essential hypertension  I10     16. Medication management  Z79.899     17. Benign prostatic hyperplasia with lower urinary tract symptoms, symptom details unspecified  N40.1       Past Psychiatric History:  [facility] Date of recent admission: 10/29/21 Date of recent discharge: 11/01/21 [facility]  [facility] OP Kathryne Sharper 6/17/-03/04/2020  Pt with Opiate dependence MAT Suboxone at [facility] Dr Rayna Sexton C/O of paranoia he is aware of but cant stop/completely control.Has rx for Risperdal from [employer]. Also hx of Gamma Knife tradiation fo benign brain tumor ?2018 Says has records Requesting Psychiatrist/medication mangement for paranoia hx [facility] OP [place] 08/21/2021 - NOW [facility] INPATIENT  Date of Admission:  10/02/2021 Date of Discharge: 10/13/2021  Date of Admission:06/13/2022 [facility] ED)  Date of Discharge:06/16/22  Date of Admission: 06/25/2022  Date of Discharge:06/29/2022   [name] 05/27/2022- 06/03/2022   [place] Alcohol intoxication 03/26/2017  Alcohol withdrawal  delirium 03/26/2017  Chronic daily headache 02/25/2017  Alcohol use disorder, severe, dependence 02/22/2017  Heroin dependence MAT Methadone Metro/Subutex TBR 01/2018  Anxiety disorder, unspecified 01/20/2017  Paranoia 03/2018    [facility] Jacklynn Bue, NP - 04/12/2017 12:38 PM EST Formatting of this note might be different from the original. [facility] - Substance Abuse PHP/IOP Intake Assessment  BH Formulate an individual and appropriate relapse prevention plan.   BH SU Problem: Formation of a relapse prevention plan    [facility] Uncomplicated opioid dependence (CMS/HCC) 12/24/2017  1. Discharge from hospital. 2. Follow up at [facility]   Past Medical History:  Past Medical History:  Diagnosis Date   ADHD    Anxiety    Brain tumor (HCC)    balance and occ memory issues and headaches   Brain tumor (HCC)    sees [facility] q year   Depression    Headache    migraine and tension   Hypothyroidism    Soft tissue mass    left shoulder   Substance abuse (HCC)     Past Surgical History:  Procedure Laterality Date   colonscopy     gamma knife radiation 2018 for brain tumor'     hematoma removed from arm Left    MASS EXCISION Left 06/30/2019   Procedure: EXCISION OF SOFT TISSUE  MASS LEFT SHOULDER;  Surgeon: Darnell Level, MD;  Location: [facility];  Service: General;  Laterality: Left;  LMA    Family Psychiatric History:  Alcoholism and drug abuse on father's side   Family History:  *Hypertension Mother   Hypertension/Alcoholism/Addiction Father    Social History:  Social History   Socioeconomic History    Marital status: Single       Spouse name: Not on file   Number of children: Not on file   Years of education: Not on file   Highest education level: Not on file  Occupational History   Not on file  Tobacco Use   Smoking status: Former      Types: Cigarettes   Smokeless tobacco: Current    Tobacco comments:      quit jan 2020  Vaping Use   Vaping Use: Every day  Substance and Sexual Activity   Alcohol use: Not Currently      Comment: quit Oct 2019   Drug use: Not Currently      Types: Cocaine, IV, Heroin, MDMA (Ecstacy)      Comment: last reported use; cocaine and heroin in 2019; MDMA Feb 2023   Sexual activity: Not Currently  Other Topics Concern   Not on file  Social History Narrative   Not on file  Social Determinants of Health   Difficulty of Paying Living Expenses: Disability  Food Insecurity:    Worried About Running Out of Food in the Last Year: On disability   Ran Out  of Food in the Last Year:   Transportation Needs:    Lack of Transportation (Medical): Uses Consulting civil engineer (Non-Medical):   Physical Activity:    Days of Exercise per Week:    Minutes of Exercise per Session:   Stress:    Feeling of Stress : Constant   Social Connections:    Frequency of Communication with Friends and Family: Mother recently kicked him out of house   Frequency of Social Gatherings with Friends and Family: Lives alone-   Attends Religious Services: No   Active Member of Clubs or Organizations: Restarting active with AA /NA    Social Connections: Mother/Computer/Had started AA until breakdown    Allergies:  Allergies  Allergen Reactions   Ingrezza [Valbenazine Tosylate] Other (See Comments)    Severe tongue chewing at 80 mg Tolerates lower doses   Naloxone Palpitations    Pt report MAT Rx is Subutex/Buprenorphine only   Amphetamine-Dextroamphetamine Other (See Comments)    unknown    Metabolic Disorder Labs: Lab Results  Component Value Date   HGBA1C 5.0 10/03/2021   MPG 96.8 10/03/2021   No results found for: "PROLACTIN" Lab Results  Component Value Date   CHOL 191 10/03/2021   TRIG 92 10/03/2021   HDL 48 10/03/2021   CHOLHDL 4.0 10/03/2021   VLDL 18 10/03/2021   LDLCALC 125 (H) 10/03/2021   Lab Results  Component Value Date   TSH  3.257 06/22/2022   TSH 1.500 02/04/2022    Therapeutic Level Labs:NA  Current Medications: Current Outpatient Medications  Medication Sig Dispense Refill   benztropine (COGENTIN) 2 MG tablet Take 1 tablet (2 mg total) by mouth 2 (two) times daily. 60 tablet 2   buprenorphine (SUBUTEX) 8 MG SUBL SL tablet Place 8 mg under the tongue 3 (three) times daily.     diphenhydrAMINE (BENADRYL) 50 MG tablet Take 2-4 tablets q6h as needed for restlessness related to Haldol (Patient taking differently: Take 25 mg by mouth every 8 (eight) hours as needed (For restlessness per patient).) 100 tablet 2   doxycycline (VIBRAMYCIN) 50 MG capsule Take 1 capsule (50 mg total) by mouth daily. 90 capsule 1   finasteride (PROSCAR) 5 MG tablet Take 1 tablet (5 mg total) by mouth daily. 30 tablet 2   Fluocinolone Acetonide 0.01 % OIL      gabapentin (NEURONTIN) 400 MG capsule Take 1 capsule (400 mg total) by mouth 2 (two) times daily for 180 doses. 180 capsule 0   haloperidol (HALDOL) 2 MG tablet Take 1 tablets with 1 5mg  tablet daily 90 tablet 0   haloperidol (HALDOL) 5 MG tablet Take 3 tablets (15 mg total) by mouth at bedtime. 90 tablet 2   hydrOXYzine (ATARAX) 25 MG tablet Take 25 mg by mouth in the morning and at bedtime.     levothyroxine (SYNTHROID) 150 MCG tablet Take 1 tablet (150 mcg total) by mouth daily. 30 tablet 11   metroNIDAZOLE (METROGEL) 0.75 % gel      metroNIDAZOLE (METROGEL) 1 % gel Apply topically daily. 45 g 0   modafinil (PROVIGIL) 200 MG tablet Take 2 tablets (400 mg total) by mouth daily. 60 tablet 1   nicotine (NICODERM CQ - DOSED IN MG/24 HOURS) 21 mg/24hr patch Place 21 mg onto the skin daily.     tamsulosin (FLOMAX) 0.4 MG CAPS capsule Take by mouth.     tamsulosin (FLOMAX) 0.4 MG CAPS capsule Take by mouth.     No current  facility-administered medications for this visit.    Musculoskeletal: Strength & Muscle Tone: Telepsych visit-Grossly normal Musculoskeletal and cranial nerve  inspections Gait & Station: NA Patient leans: N/A  Psychiatric Specialty Exam: Review of Systems  Constitutional:  Negative for activity change, appetite change, chills, diaphoresis, fatigue, fever and unexpected weight change.  HENT: Negative.    Eyes:  Negative for photophobia, pain, discharge, redness, itching and visual disturbance.  Endocrine: Negative for cold intolerance, heat intolerance, polydipsia, polyphagia and polyuria.  Genitourinary:  Positive for difficulty urinating. Negative for decreased urine volume, dysuria, enuresis, flank pain, frequency, genital sores, hematuria, penile discharge, penile pain, penile swelling, scrotal swelling, testicular pain and urgency.       BPH  Musculoskeletal: Negative.   Skin:  Positive for color change and rash.       Rosacea  Neurological:  Negative for dizziness, tremors (Familial), seizures, syncope, facial asymmetry, speech difficulty, weakness, light-headedness, numbness and headaches.       Meningioma  Psychiatric/Behavioral:  Positive for sleep disturbance. Negative for agitation, behavioral problems, confusion, decreased concentration, dysphoric mood, hallucinations, self-injury and suicidal ideas. The patient is not nervous/anxious and is not hyperactive.        Polysubstance dependence in early remission    There were no vitals taken for this visit.There is no height or weight on file to calculate BMI.My Chart visit Most Recent Value BP:107/80  as of 06/29/2022 Pulse Rate:81  as of 06/29/2022 Body Mass Index:None Height:5\' 10"  (1.778 m)  as of 06/20/2022 Weight:198 lb 3.1 oz (89.9 kg)  as of 06/29/2022 Resp:17  as of 06/29/2022 SpO2:96%  as of 06/29/2022 Temp:97.5 F (36.4 C) Abnormal   as of 06/29/2022   General Appearance: Casual and Well Groomed  Eye Contact:  Good  Speech:  Clear and Coherent  Volume:  Normal  Mood:  Euthymic  Affect:  Appropriate and Congruent  Thought Process:  Coherent, Goal Directed, and Descriptions of  Associations: Intact  Orientation:  Full (Time, Place, and Person)  Thought Content: WDL and Logical   Suicidal Thoughts:  No  Homicidal Thoughts:  No  Memory:  Affected by SUDS/Psychotic episodes   Judgement:  Other:  Improving  Insight:  Present  Psychomotor Activity:  Normal  Concentration:  Concentration: Good and Attention Span: Good  Recall:   see memory  Fund of Knowledge:  WDL  Language: Good  Akathisia:  Negative  Handed:  Right  AIMS (if indicated): Grossly Negative  Assets:  Desire for Improvement Financial Resources/Insurance Housing Leisure Time Resilience Social Support Talents/Skills Vocational/Educational  ADL's:  Intact  Cognition: WNL  Sleep:  No complaint today    Screenings: AIMS    Flowsheet Row Video [facility]20[facility] in [facility] Office Visit from 11[facility]27/2021 in [facility] Office Visit from 11/06/2021 in [facility] Office Visit from 10[facility]23/2021 in [facility] Office Visit from 10/16/2021 in [facility]  AIMS Total Score 0 4 7 5 21       Flowsheet Row ED to Hosp-Admission (Discharged) from 06/20/2022 in [facility]  SURGICAL ED from 06/13/2022 in [facility] Department at [facility] ED from 05/25/2022 in [facility] Emergency Department at [name]  C-SSRS RISK CATEGORY Low Risk No Risk No Risk        Assessment : Continues to improve      and Plan: Decrease Haldol to 7 mg RF  Modanifil FU 2 weeks -sooner if needed Get Thyroid panel    [name], PA-C 09/10/2022, 4:06 PM

## 2022-09-30 IMAGING — CT CT HEAD W/O CM
3 series · 16 of 47 positions shown, 19 images · non-contrast
Comparison: July 29, 2021 CT scan of the brain

CLINICAL DATA: Dizziness.



[Series 2: head wo · axial · 0.48mm/px · z∈[-216,-76]mm · 10 of 34 slices shown, 13 images]
[im 3/34  brain]
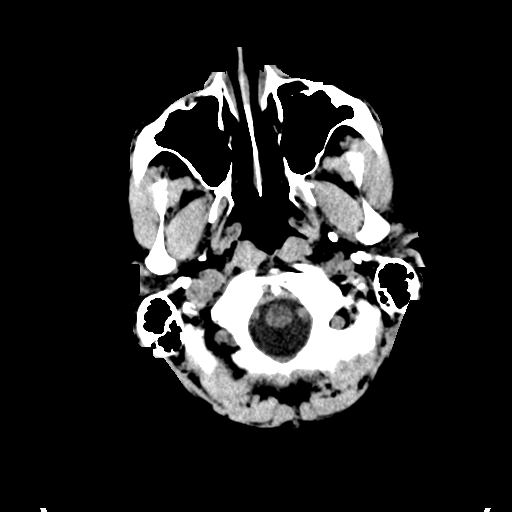
[im 3/34  bone]
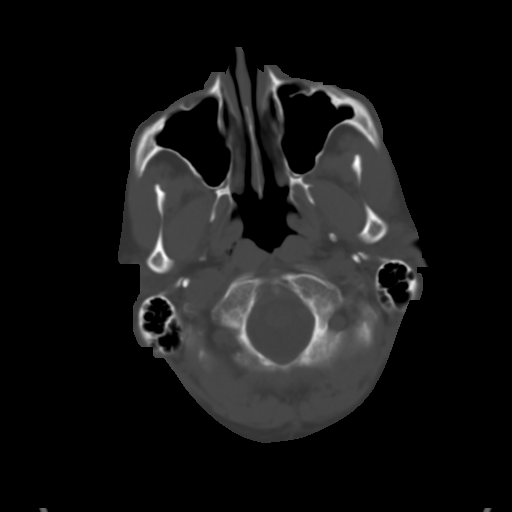
[im 6/34  brain]
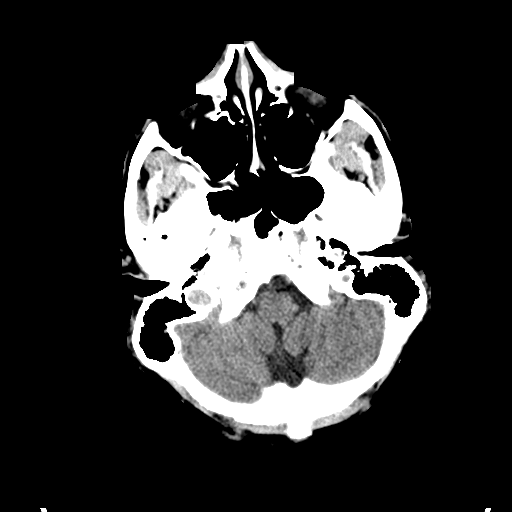
[im 10/34  brain]
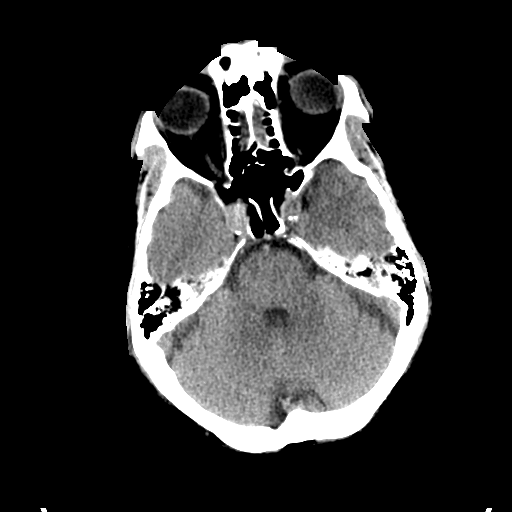
[im 12/34  brain]
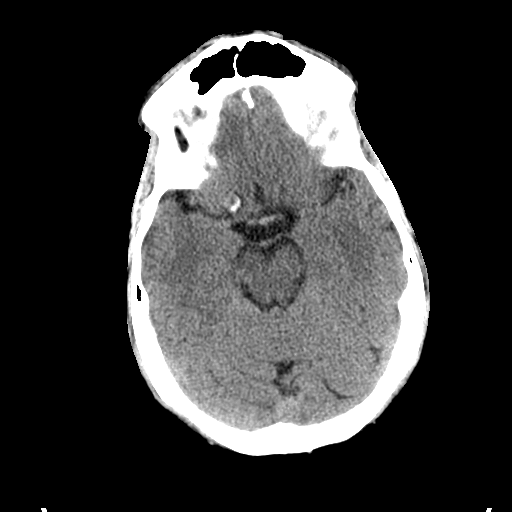
[im 15/34  brain]
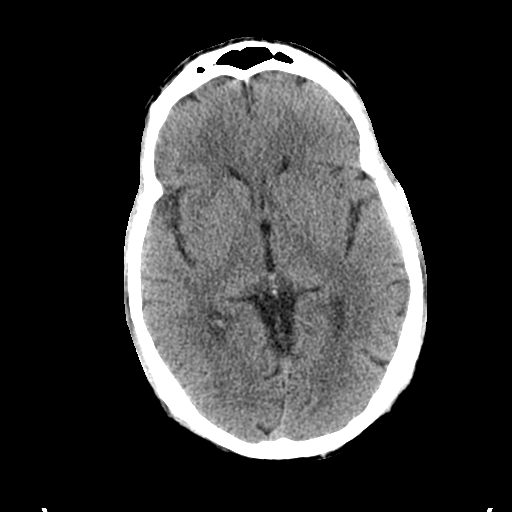
[im 15/34  bone]
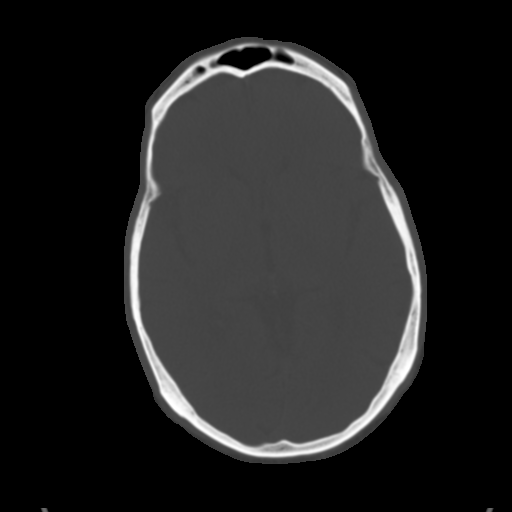
[im 19/34  brain]
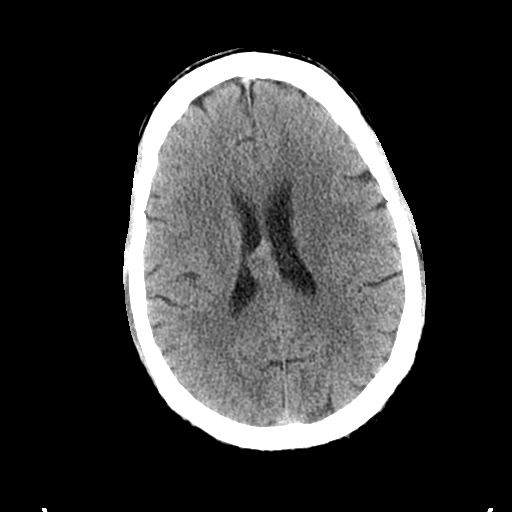
[im 22/34  brain]
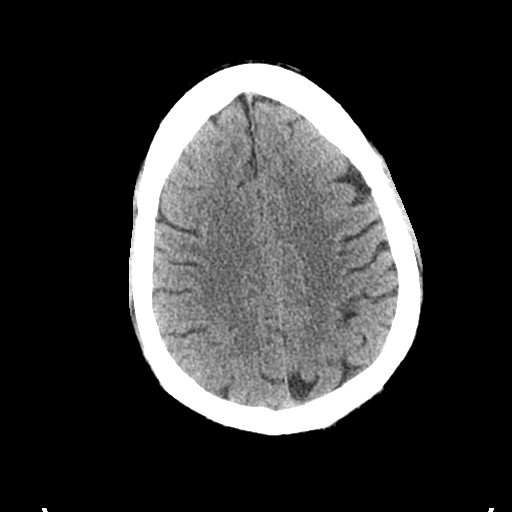
[im 26/34  brain]
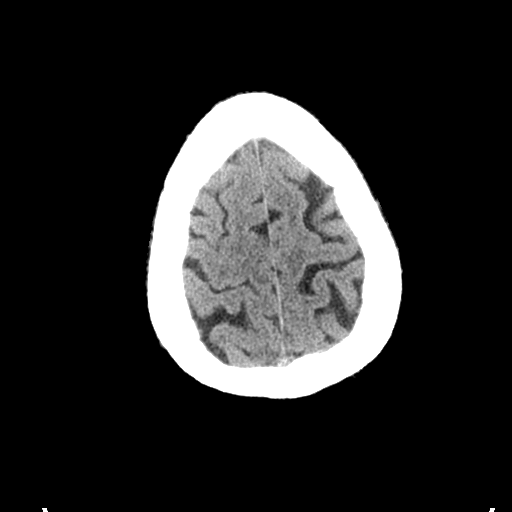
[im 28/34  brain]
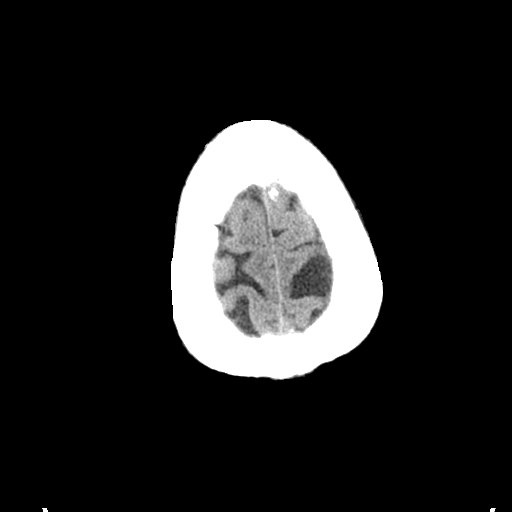
[im 28/34  bone]
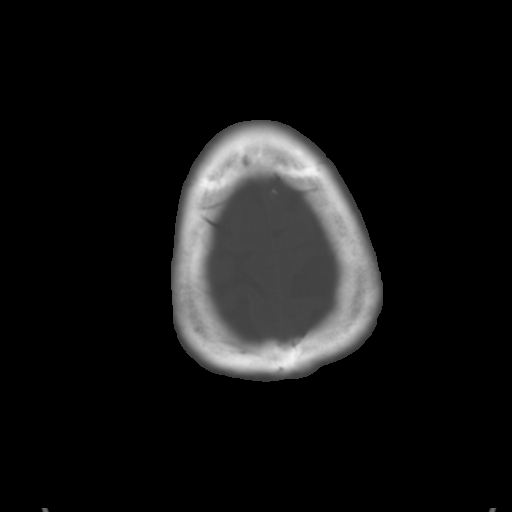
[im 31/34  brain]
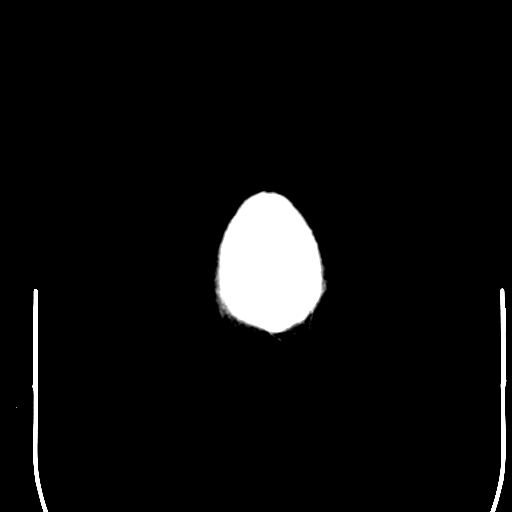

[Series 4: coronal soft tissue · coronal · 0.37mm/px · 3 of 75 slices shown]
[im 25/75  brain]
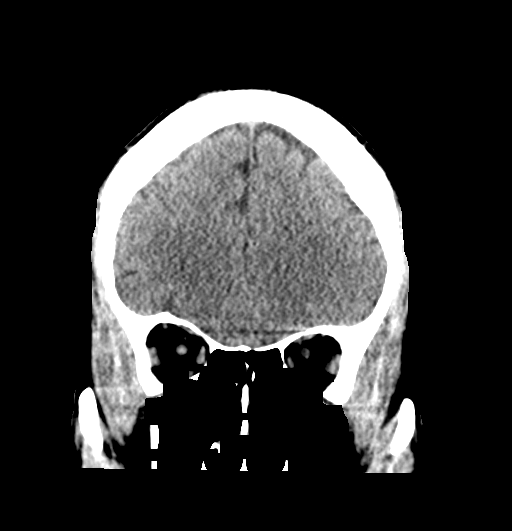
[im 33/75  brain]
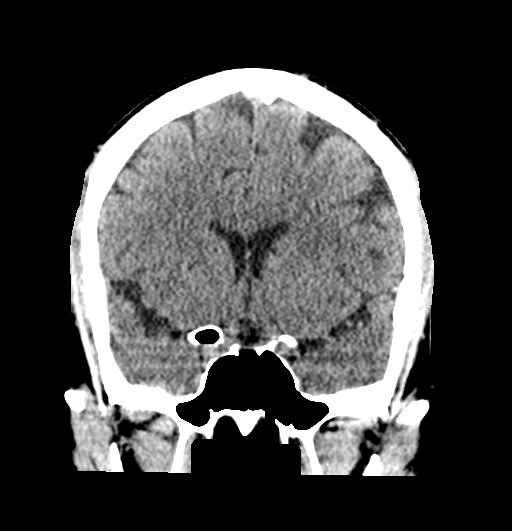
[im 42/75  brain]
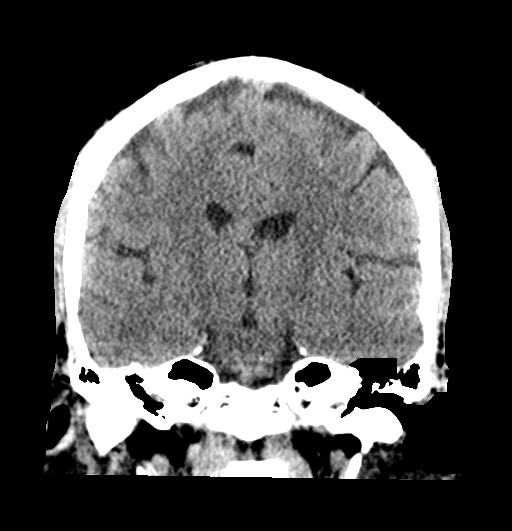

[Series 5: sagittal soft tissue · sagittal · 0.38mm/px · 3 of 61 slices shown]
[im 21/61  brain]
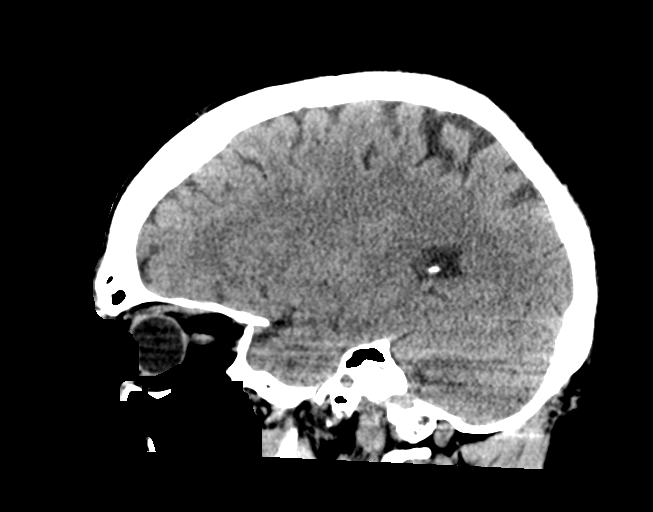
[im 31/61  brain]
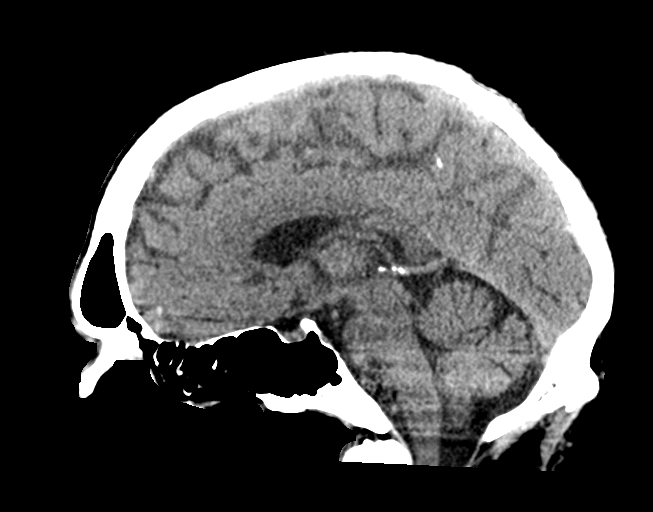
[im 41/61  brain]
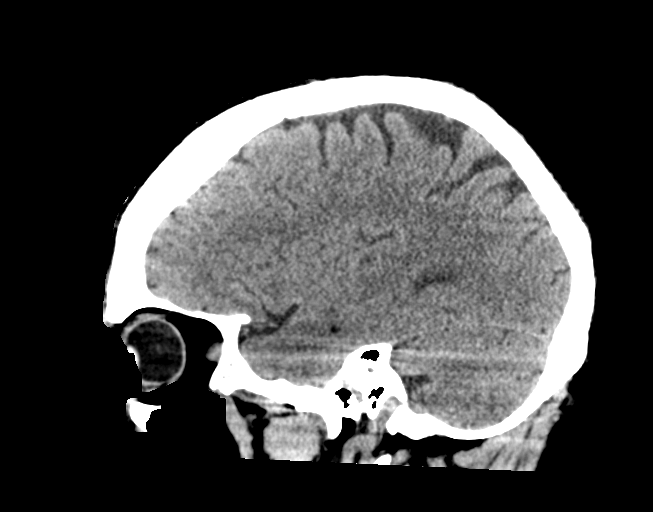

[16 of 47 positions shown; findings below may reference images not displayed]

FINDINGS: Brain: The known right lateral intraventricular mass is again
identified and stable. The ventricles and sulci are unchanged.
Cerebellum, brainstem, and basal cisterns are normal. No midline
shift. No subdural, epidural, or subarachnoid hemorrhage. No acute
cortical ischemia or infarct.

Vascular: No hyperdense vessel or unexpected calcification.

Skull: Normal. Negative for fracture or focal lesion.

Sinuses/Orbits: No acute finding.

Other: None.
IMPRESSION: 1. No acute intracranial abnormalities are identified.
2. Stable right lateral intraventricular mass.

## 2022-10-01 ENCOUNTER — Encounter (HOSPITAL_COMMUNITY): Payer: Self-pay | Admitting: Medical

## 2022-10-01 ENCOUNTER — Telehealth (HOSPITAL_BASED_OUTPATIENT_CLINIC_OR_DEPARTMENT_OTHER): Payer: Medicare Other | Admitting: Medical

## 2022-10-01 DIAGNOSIS — Z923 Personal history of irradiation: Secondary | ICD-10-CM

## 2022-10-01 DIAGNOSIS — F0634 Mood disorder due to known physiological condition with mixed features: Secondary | ICD-10-CM

## 2022-10-01 DIAGNOSIS — I639 Cerebral infarction, unspecified: Secondary | ICD-10-CM

## 2022-10-01 DIAGNOSIS — D320 Benign neoplasm of cerebral meninges: Secondary | ICD-10-CM

## 2022-10-01 DIAGNOSIS — F1910 Other psychoactive substance abuse, uncomplicated: Secondary | ICD-10-CM

## 2022-10-01 DIAGNOSIS — F1920 Other psychoactive substance dependence, uncomplicated: Secondary | ICD-10-CM | POA: Diagnosis not present

## 2022-10-01 DIAGNOSIS — E039 Hypothyroidism, unspecified: Secondary | ICD-10-CM

## 2022-10-01 DIAGNOSIS — I10 Essential (primary) hypertension: Secondary | ICD-10-CM

## 2022-10-01 DIAGNOSIS — G4730 Sleep apnea, unspecified: Secondary | ICD-10-CM

## 2022-10-01 DIAGNOSIS — I6381 Other cerebral infarction due to occlusion or stenosis of small artery: Secondary | ICD-10-CM | POA: Diagnosis not present

## 2022-10-01 DIAGNOSIS — L719 Rosacea, unspecified: Secondary | ICD-10-CM

## 2022-10-01 DIAGNOSIS — G250 Essential tremor: Secondary | ICD-10-CM

## 2022-10-01 DIAGNOSIS — N139 Obstructive and reflux uropathy, unspecified: Secondary | ICD-10-CM

## 2022-10-01 DIAGNOSIS — G4710 Hypersomnia, unspecified: Secondary | ICD-10-CM | POA: Diagnosis not present

## 2022-10-01 DIAGNOSIS — Z79899 Other long term (current) drug therapy: Secondary | ICD-10-CM

## 2022-10-01 DIAGNOSIS — F060 Psychotic disorder with hallucinations due to known physiological condition: Secondary | ICD-10-CM

## 2022-10-01 DIAGNOSIS — Z8659 Personal history of other mental and behavioral disorders: Secondary | ICD-10-CM

## 2022-10-01 MED ORDER — HALOPERIDOL 5 MG PO TABS: 15.0000 mg | ORAL_TABLET | Freq: Every day | ORAL | 2 refills | Status: DC

## 2022-10-01 MED ORDER — BENZTROPINE MESYLATE 2 MG PO TABS: 2.0000 mg | ORAL_TABLET | Freq: Two times a day (BID) | ORAL | 2 refills | Status: DC

## 2022-10-01 NOTE — Progress Notes (Signed)
Virtual Visit via Video Note  I connected with Paul Bray on 10/01/22 at  3:00 PM EDT by a video enabled telemedicine application and verified that I am speaking with the correct person using two identifiers.  Location: Patient: Home Provider: [facility]   I discussed the limitations of evaluation and management by telemedicine and the availability of in person appointments. The patient expressed understanding and agreed to proceed.   History of Present Illness:See EPIC note    Observations/Objective:See EPIC note   Assessment and Plan:See EPIC note   Follow Up Instructions:See EPIC note   I discussed the assessment and treatment plan with the patient. The patient was provided an opportunity to ask questions and all were answered. The patient agreed with the plan and demonstrated an understanding of the instructions.   The patient was advised to call back or seek an in-person evaluation if the symptoms worsen or if the condition fails to improve as anticipated.  I provided 20 minutes of non-face-to-face time during this encounter.   Paul Bray, Paul Bray  [facility] MD/PA/NP OP Progress Note  10/01/2022 3:47 PM JOHNSON NELLESSEN  MRN:  [id number]  Chief Complaint:  Chief Complaint  Patient presents with   Follow-up   Addiction Problem   Stress   Trauma   Meningioma   Post stroke psychosis   HPI: Paul Bray returns for 2 week FU for his Polysubstance dependence and Post Stroke Psychosis. He continues to be stable at current dose of Haldol Visit Diagnosis:    ICD-10-CM   1. Polysubstance dependence (HCC)  F19.20     2. Multiple lacunar infarcts (HCC)  I63.81     3. Infarction of left basal ganglia (HCC)  I63.9     4. Hypersomnia with sleep apnea  G47.10    G47.30     5. Drug abuse, IV (HCC)  F19.10     6. 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 Meningioma, cerebral (HCC)  D32.0     8. Status post gamma knife treatment   Z92.3     9. Familial tremor  G25.0     10. Hypothyroidism, unspecified type  E03.9     11. History of ADHD  Z86.59     12. 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 Rosacea  L71.9     14. Essential hypertension  I10     15. Urinary (tract) obstruction  N13.9     16. Medication management  Z79.899       Past Psychiatric History: ***  Past Medical History:  Past Medical History:  Diagnosis Date   ADHD    Anxiety    Brain tumor (HCC)    balance and occ memory issues and headaches   Brain tumor (HCC)    sees [facility] q year   Depression    Headache    migraine and tension   Hypothyroidism    Soft tissue mass    left shoulder   Substance abuse (HCC)     Past Surgical History:  Procedure Laterality Date   colonscopy     gamma knife radiation 2018 for brain tumor'     hematoma removed from arm Left    MASS EXCISION Left 06/30/2019   Procedure: EXCISION OF SOFT TISSUE  MASS LEFT SHOULDER;  Surgeon: Darnell Level, MD;  Location: [facility] [place];  Service: General;  Laterality: Left;  LMA    Family Psychiatric History: ***  Family History: No family history on file.  Social History:  Social History   Socioeconomic History   Marital status: Single    Spouse name: Not on file   Number of children: Not on file   Years of education: Not on file   Highest education level: Not on file  Occupational History   Not on file  Tobacco Use   Smoking status: Former    Types: Cigarettes   Smokeless tobacco: Current   Tobacco comments:    quit jan 2020  Vaping Use   Vaping Use: Every day  Substance and Sexual Activity   Alcohol use: Not Currently    Comment: quit Oct 2019   Drug use: Not Currently    Types: Cocaine, IV, Heroin, MDMA (Ecstacy)    Comment: last reported use; cocaine and heroin in 2019; MDMA Feb 2023   Sexual activity: Not Currently  Other Topics Concern   Not on file  Social  History Narrative   Not on file   Social Determinants of Health   Financial Resource Strain: Not on file  Food Insecurity: Not on file  Transportation Needs: Not on file  Physical Activity: Not on file  Stress: Not on file  Social Connections: Not on file    Allergies:  Allergies  Allergen Reactions   Ingrezza [Valbenazine Tosylate] Other (See Comments)    Severe tongue chewing at 80 mg Tolerates lower doses   Naloxone Palpitations    Pt report MAT Rx is Subutex/Buprenorphine only   Amphetamine-Dextroamphetamine Other (See Comments)    unknown    Metabolic Disorder Labs: Lab Results  Component Value Date   HGBA1C 5.0 10/03/2021   MPG 96.8 10/03/2021   No results found for: "PROLACTIN" Lab Results  Component Value Date   CHOL 191 10/03/2021   TRIG 92 10/03/2021   HDL 48 10/03/2021   CHOLHDL 4.0 10/03/2021   VLDL 18 10/03/2021   LDLCALC 125 (H) 10/03/2021   Lab Results  Component Value Date   TSH 3.257 06/22/2022   TSH 1.500 02/04/2022    Therapeutic Level Labs: No results found for: "LITHIUM" No results found for: "VALPROATE" No results found for: "CBMZ"  Current Medications: Current Outpatient Medications  Medication Sig Dispense Refill   benztropine (COGENTIN) 2 MG tablet Take 1 tablet (2 mg total) by mouth 2 (two) times daily. 60 tablet 2   buprenorphine (SUBUTEX) 8 MG SUBL SL tablet Place 8 mg under the tongue 3 (three) times daily.     diphenhydrAMINE (BENADRYL) 50 MG tablet Take 2-4 tablets q6h as needed for restlessness related to Haldol (Patient taking differently: Take 25 mg by mouth every 8 (eight) hours as needed (For restlessness per patient).) 100 tablet 2   doxycycline (VIBRAMYCIN) 50 MG capsule Take 1 capsule (50 mg total) by mouth daily. 90 capsule 1   Fluocinolone Acetonide 0.01 % OIL      gabapentin (NEURONTIN) 400 MG capsule Take 1 capsule (400 mg total) by mouth 2 (two) times daily for 180 doses. 180 capsule 0   haloperidol (HALDOL) 2 MG  tablet Take 1 tablets with 1 5mg  tablet daily 90 tablet 0   haloperidol (HALDOL) 5 MG tablet Take 3 tablets (15 mg total) by mouth at bedtime. 90 tablet 2   hydrOXYzine (ATARAX) 25 MG tablet Take 25 mg by mouth in the morning and at bedtime.     levothyroxine (SYNTHROID) 150 MCG tablet Take 1  tablet (150 mcg total) by mouth daily. 30 tablet 11   metroNIDAZOLE (METROGEL) 0.75 % gel      metroNIDAZOLE (METROGEL) 1 % gel Apply topically daily. 45 g 0   modafinil (PROVIGIL) 200 MG tablet Take 2 tablets (400 mg total) by mouth daily. 60 tablet 1   nicotine (NICODERM CQ - DOSED IN MG/24 HOURS) 21 mg/24hr patch Place 21 mg onto the skin daily.     tamsulosin (FLOMAX) 0.4 MG CAPS capsule Take by mouth.     tamsulosin (FLOMAX) 0.4 MG CAPS capsule Take by mouth.     No current facility-administered medications for this visit.     Musculoskeletal: Strength & Muscle Tone: Telepsych visit-Grossly normal Musculoskeletal and cranial nerve inspections Gait & Station: NA Patient leans: N/A  Psychiatric Specialty Exam: Review of Systems  There were no vitals taken for this visit.There is no height or weight on file to calculate BMI.  General Appearance: {Appearance:22683}  Eye Contact:  {BHH EYE CONTACT:22684}  Speech:  {Speech:22685}  Volume:  {Volume (PAA):22686}  Mood:  {BHH MOOD:22306}  Affect:  {Affect (PAA):22687}  Thought Process:  {Thought Process (PAA):22688}  Orientation:  {BHH ORIENTATION (PAA):22689}  Thought Content: {Thought Content:22690}   Suicidal Thoughts:  {ST/HT (PAA):22692}  Homicidal Thoughts:  {ST/HT (PAA):22692}  Memory:  {BHH MEMORY:22881}  Judgement:  {Judgement (PAA):22694}  Insight:  {Insight (PAA):22695}  Psychomotor Activity:  {Psychomotor (PAA):22696}  Concentration:  {Concentration:21399}  Recall:  {BHH GOOD/FAIR/POOR:22877}  Fund of Knowledge: {BHH GOOD/FAIR/POOR:22877}  Language: {BHH GOOD/FAIR/POOR:22877}  Akathisia:  {BHH YES OR NO:22294}  Handed:   {Handed:22697}  AIMS (if indicated): {Desc; done/not:10129}  Assets:  {Assets (PAA):22698}  ADL's:  {BHH ZOX'W:96045}  Cognition: {chl bhh cognition:304700322}  Sleep:  {BHH GOOD/FAIR/POOR:22877}   Screenings: AIMS    Flowsheet Row Video Visit from 07/23/2022 in [facility] Office Visit from 11/27/2021 in [facility] Office Visit from 11/06/2021 in [facility] Office Visit from 10[facility]23/2021 in [facility] Office Visit from 10[facility]16/2021 in [facility]  AIMS Total Score 0 4 7 5 21       Flowsheet Row ED to Hosp-Admission (Discharged) from 06/20/2022 in [facility]  SURGICAL ED from 06/13/2022 in [facility] Emergency Department at [name] ED from 05/25/2022 in [facility] Emergency Department at [facility]  C-SSRS RISK CATEGORY Low Risk No Risk No Risk        Assessment and Plan: ***  Collaboration of Care: Collaboration of Care: [facility] OP Collaboration of Care:[id number]}  Patient/Guardian was advised Release of Information must be obtained prior to any record release in order to collaborate their care with an outside provider. Patient/Guardian was advised if they have not already done so to contact the registration department to sign all necessary forms in order for [place] to release information regarding their care.   Consent: Patient/Guardian gives verbal consent for treatment and assignment of benefits for services provided during this visit. Patient/Guardian expressed understanding and agreed to proceed.    Paul Morn, PA-C 10/01/2022, 3:47 PM

## 2022-10-12 MED ORDER — HALOPERIDOL 5 MG PO TABS: 5.0000 mg | ORAL_TABLET | Freq: Every day | ORAL | 2 refills | Status: DC

## 2022-10-15 ENCOUNTER — Encounter (HOSPITAL_COMMUNITY): Payer: Self-pay | Admitting: Medical

## 2022-10-15 ENCOUNTER — Telehealth (HOSPITAL_BASED_OUTPATIENT_CLINIC_OR_DEPARTMENT_OTHER): Payer: Medicare Other | Admitting: Medical

## 2022-10-15 DIAGNOSIS — G4710 Hypersomnia, unspecified: Secondary | ICD-10-CM | POA: Diagnosis not present

## 2022-10-15 DIAGNOSIS — Z923 Personal history of irradiation: Secondary | ICD-10-CM

## 2022-10-15 DIAGNOSIS — Z79899 Other long term (current) drug therapy: Secondary | ICD-10-CM

## 2022-10-15 DIAGNOSIS — I639 Cerebral infarction, unspecified: Secondary | ICD-10-CM

## 2022-10-15 DIAGNOSIS — F1920 Other psychoactive substance dependence, uncomplicated: Secondary | ICD-10-CM

## 2022-10-15 DIAGNOSIS — I10 Essential (primary) hypertension: Secondary | ICD-10-CM

## 2022-10-15 DIAGNOSIS — N139 Obstructive and reflux uropathy, unspecified: Secondary | ICD-10-CM

## 2022-10-15 DIAGNOSIS — E039 Hypothyroidism, unspecified: Secondary | ICD-10-CM

## 2022-10-15 DIAGNOSIS — I6381 Other cerebral infarction due to occlusion or stenosis of small artery: Secondary | ICD-10-CM

## 2022-10-15 DIAGNOSIS — G4730 Sleep apnea, unspecified: Secondary | ICD-10-CM

## 2022-10-15 DIAGNOSIS — F1910 Other psychoactive substance abuse, uncomplicated: Secondary | ICD-10-CM

## 2022-10-15 DIAGNOSIS — G250 Essential tremor: Secondary | ICD-10-CM

## 2022-10-15 DIAGNOSIS — F060 Psychotic disorder with hallucinations due to known physiological condition: Secondary | ICD-10-CM

## 2022-10-15 DIAGNOSIS — L719 Rosacea, unspecified: Secondary | ICD-10-CM

## 2022-10-15 DIAGNOSIS — F0634 Mood disorder due to known physiological condition with mixed features: Secondary | ICD-10-CM

## 2022-10-15 DIAGNOSIS — Z8659 Personal history of other mental and behavioral disorders: Secondary | ICD-10-CM

## 2022-10-15 DIAGNOSIS — D320 Benign neoplasm of cerebral meninges: Secondary | ICD-10-CM

## 2022-10-15 MED ORDER — MODAFINIL 200 MG PO TABS: 600.0000 mg | ORAL_TABLET | Freq: Every day | ORAL | 2 refills | Status: DC

## 2022-10-15 MED ORDER — DOXYCYCLINE HYCLATE 50 MG PO CAPS: 50.0000 mg | ORAL_CAPSULE | Freq: Every day | ORAL | 1 refills | Status: DC

## 2022-10-15 NOTE — Progress Notes (Signed)
BH MD/PA/NP OP Progress Note  10/15/2022 4:18 PM Paul Bray  MRN:  [id number] Virtual Visit via Video Note  I connected with Paul Bray on 10/15/22 at  4:00 PM EDT by a video enabled telemedicine application and verified that I am speaking with the correct person using two identifiers.  Location: Patient: At Home Provider: [facility] [place]   I discussed the limitations of evaluation and management by telemedicine and the availability of in person appointments. The patient expressed understanding and agreed to proceed.   History of Present Illness:See EPIC note    Observations/Objective:See EPIC note   Assessment and Plan:See EPIC note   Follow Up Instructions:See EPIC note    I discussed the assessment and treatment plan with the patient. The patient was provided an opportunity to ask questions and all were answered. The patient agreed with the plan and demonstrated an understanding of the instructions.   The patient was advised to call back or seek an in-person evaluation if the symptoms worsen or if the condition fails to improve as anticipated.  I provided 20 minutes of non-face-to-face time during this encounter.   [name], PA-C   Chief Complaint:  Chief Complaint  Patient presents with   Follow-up   Addiction Problem   Lt Basilar infarcts   Post stroke Hallucinosis   Hypersomnia   Medication Refill   HPI: Paul Bray returns for scheduled 2 week FU for his complex mental health issues that began with his addiction to multiple substances and IVDU involving cocaine and heroin. Around 2010 he began to c/o hypersomnia. Brain scan revealed an oval, smooth, rounded lesion within the right lateral ventricle, in the posterior ports in the lateral ventricle that seems to be on the medial side of the choroid, slightly less than 2 cm in greatest diameter. It has never since July of 2010 changed in size.Records indicate he became convinced (obsessed) that this meningioma  was the cause of his ongoing sleep disorder. He persisted in complaining and in    2018 was given Gamma knife radiation.There was no change in his symptoms.FU studies continued to show a stable 18 mm mass. All studies prior to 10/29/2018 reported no intracranial hemorrhage the last of which was a CT in 2018.  MRI BRAIN WITHOUT CONTRAST, 10/29/2018 The mass appears to have decreased in size 1 x 1.6 x 1 cm compared to a 2011 MR study where it measured 1.5 x 1.8 x 1 cm.  Remote lacunar infarcts in the left basal ganglia. No evidence of acute abnormality. No significant white matter disease. No evidence of acute infarct. No mass effect, acute hemorrhage, or hydrocephalus. Grossly normal flow-related signal in the major intracranial arteries and dural sinuses.  The strokes most likely occurred during a time when he had history of IV Cocaine use in 2019.Marland Kitchen  His history of hypertension could have contributed as well . It was at this time he began to experience auditory hallucinations and bega to receive antipsychotic medications from his Suboxone providerwho subsequently became unreliable.  On 08/03/2018 he initially contacted this provider:  Pt with Opiate dependence MAT Suboxone at [facility] Dr Paul Bray C/O of paranoia he is aware of but cant stop/completely control.Has rx for Risperdal from [facility]. Also hx of Gamma Knife tradiation fo benign brain tumor ?2018 Says has records Requesting Psychiatrist/medication mangement for paranoia hx     Subsequently he established care here and with [facility] (Suboxone program) Since April of 2020 he has had 12 admissions  with little success until second to the last when he was started on Haldol after failing second generation antipsychotics. He has had 1 relapse on Haldol after stopping medication while on a low dose taper as well as breaking into his medicine supply managed by his mother due to history of abuse and noncompliance that led to a number  of earlier hospitalizations.He took Modafanil and Suboxone the 2 controlled substances in his medication regimin (indicating he still has an addicted brain). Attempts to get him off Modafanil have met with significant resistance including today. He c/o inability to engage in ADL which include his computer work/skills. He is "miserable" and insisting on going back up on his Modafanil. He says Haldol and Cogentin contribute to his somnolence/fatigue. He refuses to schedule a sleep study c/o $5000 medical bill he cannot pay. (The Meningioma was discovered when he was scheduled for Sleep Study but was never done and there is no record one was ever done ). He hasn't gotten a Thyroid study to verify this is not a contributing factor. He seemed better on T3/T4 supplementation but was put on T4 alone during one of his hospitalizations.? He continues to report no hallucinations.  Visit Diagnosis:    ICD-10-CM   1. Polysubstance dependence (HCC)  F19.20     2. Multiple lacunar infarcts (HCC)  I63.81     3. Infarction of left basal ganglia (HCC)  I63.9     4. 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 Hypersomnia with sleep apnea  G47.10    G47.30     6. Drug abuse, IV (HCC)  F19.10     7. Meningioma, cerebral (HCC)  D32.0     8. Status post gamma knife treatment  Z92.3     9. Familial tremor  G25.0     10. Hypothyroidism, unspecified type  E03.9     11. History of ADHD  Z86.59     12. 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 Rosacea  L71.9     14. Essential hypertension  I10     15. Urinary (tract) obstruction  N13.9     16. Medication management  Z79.899       Past Psychiatric History:  [facility] Date of recent admission: 10/29/21 Date of recent discharge: 11/01/21 [facility]  [facility] [place] 6/17/-03/04/2020  Pt with Opiate dependence MAT Suboxone at  [facility] Dr Paul Bray C/O of paranoia he is aware of but cant stop/completely control.Has rx for Risperdal from [employer]. Also hx of Gamma Knife tradiation fo benign brain tumor ?2018 Says has records Requesting Psychiatrist/medication mangement for paranoia hx [facility] [place] 08/21/2021 - NOW [facility] INPATIENT  Date of Admission:  10/02/2021 Date of Discharge: 10/13/2021  Date of Admission:06/13/2022 [facility] ED)  Date of Discharge:06/16/22  Date of Admission: 06/25/2022  Date of Discharge:06/29/2022   [name] 05/27/2022- 06/03/2022   [place] Alcohol intoxication 03/26/2017  Alcohol withdrawal delirium 03/26/2017  Chronic daily headache 02/25/2017  Alcohol use disorder, severe, dependence 02/22/2017  Heroin dependence MAT Methadone [facility]/Subutex TBR 01/2018  Anxiety disorder, unspecified 01/20/2017  Paranoia 03/2018    [facility] Jacklynn Bue, NP - 04/12/2017 12:38 PM EST Formatting of this note might be different from the original. [facility] - Substance Abuse PHP/IOP Intake Assessment  BH Formulate an individual and appropriate relapse prevention plan.  BH SU Problem: Formation of a relapse prevention plan    [facility] Uncomplicated opioid dependence (CMS/HCC) 12/24/2017  1. Discharge from hospital. 2. Follow up at [facility]   Past Medical History:  Past Medical History:  Diagnosis Date   ADHD    Anxiety    Brain tumor (HCC)    balance and occ memory issues and headaches   Brain tumor (HCC)    sees [facility] q year   Depression    Headache    migraine and tension   Hypothyroidism    Soft tissue mass    left shoulder   Substance abuse (HCC)     Past Surgical History:  Procedure Laterality Date   colonscopy     gamma knife radiation 2018 for brain tumor'     hematoma removed from arm Left    MASS EXCISION Left 06/30/2019   Procedure: EXCISION OF SOFT TISSUE  MASS LEFT SHOULDER;  Surgeon: Darnell Level, MD;   Location: [facility];  Service: General;  Laterality: Left;  LMA    Family Psychiatric History:  Alcoholism and drug abuse on father's side    Family History: No family history on file.  Social History:  Social History   Socioeconomic History   Marital status: Single    Spouse name: Not on file   Number of children: Not on file   Years of education: Not on file   Highest education level: Not on file  Occupational History   Not on file  Tobacco Use   Smoking status: Former    Types: Cigarettes   Smokeless tobacco: Current   Tobacco comments:    quit jan 2020  Vaping Use   Vaping Use: Every day  Substance and Sexual Activity   Alcohol use: Not Currently    Comment: quit Oct 2019   Drug use: Not Currently    Types: Cocaine, IV, Heroin, MDMA (Ecstacy)    Comment: last reported use; cocaine and heroin in 2019; MDMA Feb 2023   Sexual activity: Not Currently  Other Topics Concern   Not on file  Social History Narrative   Not on file   Social Determinants of Health   Financial Resource Strain: Not on file  Food Insecurity: Not on file  Transportation Needs: Not on file  Physical Activity: Not on file  Stress: Not on file  Social Connections: Not on file    Allergies:  Allergies  Allergen Reactions   Ingrezza [Valbenazine Tosylate] Other (See Comments)    Severe tongue chewing at 80 mg Tolerates lower doses   Naloxone Palpitations    Pt report MAT Rx is Subutex/Buprenorphine only   Amphetamine-Dextroamphetamine Other (See Comments)    unknown    Metabolic Disorder Labs: Lab Results  Component Value Date   HGBA1C 5.0 10/03/2021   MPG 96.8 10/03/2021   No results found for: "PROLACTIN" Lab Results  Component Value Date   CHOL 191 10/03/2021   TRIG 92 10/03/2021   HDL 48 10/03/2021   CHOLHDL 4.0 10/03/2021   VLDL 18 10/03/2021   LDLCALC 125 (H) 10/03/2021   Lab Results  Component Value Date   TSH 3.257 06/22/2022   TSH 1.500  02/04/2022    Therapeutic Level Labs:NA  Current Medications: Current Outpatient Medications  Medication Sig Dispense Refill   benztropine (COGENTIN) 2 MG tablet Take 1 tablet (2 mg total) by mouth 2 (two) times daily. 60 tablet 2   buprenorphine (SUBUTEX) 8 MG SUBL SL tablet  Place 8 mg under the tongue 3 (three) times daily.     diphenhydrAMINE (BENADRYL) 50 MG tablet Take 2-4 tablets q6h as needed for restlessness related to Haldol (Patient taking differently: Take 25 mg by mouth every 8 (eight) hours as needed (For restlessness per patient).) 100 tablet 2   doxycycline (VIBRAMYCIN) 50 MG capsule Take 1 capsule (50 mg total) by mouth daily. 90 capsule 1   Fluocinolone Acetonide 0.01 % OIL      gabapentin (NEURONTIN) 400 MG capsule Take 1 capsule (400 mg total) by mouth 2 (two) times daily for 180 doses. 180 capsule 0   haloperidol (HALDOL) 5 MG tablet Take 1 tablet (5 mg total) by mouth at bedtime. 30 tablet 2   hydrOXYzine (ATARAX) 25 MG tablet Take 25 mg by mouth in the morning and at bedtime.     levothyroxine (SYNTHROID) 150 MCG tablet Take 1 tablet (150 mcg total) by mouth daily. 30 tablet 11   metroNIDAZOLE (METROGEL) 0.75 % gel      metroNIDAZOLE (METROGEL) 1 % gel Apply topically daily. 45 g 0   modafinil (PROVIGIL) 200 MG tablet Take 3 tablets (600 mg total) by mouth daily. 90 tablet 2   nicotine (NICODERM CQ - DOSED IN MG/24 HOURS) 21 mg/24hr patch Place 21 mg onto the skin daily.     tamsulosin (FLOMAX) 0.4 MG CAPS capsule Take by mouth.     No current facility-administered medications for this visit.    Musculoskeletal: Strength & Muscle Tone: Telepsych visit-Grossly normal Musculoskeletal and cranial nerve inspections Gait & Station: NA Patient leans: N/A  Psychiatric Specialty Exam: Review of Systems  Constitutional:  Positive for activity change and fatigue. Negative for appetite change, chills, diaphoresis, fever and unexpected weight change.       C/O inability to  perform at current dose of Modafinil  Eyes: Negative.   Respiratory: Negative.         Vapes  Cardiovascular: Negative.   Gastrointestinal: Negative.   Endocrine: Negative for cold intolerance, heat intolerance, polydipsia, polyphagia and polyuria.  Genitourinary: Negative.  Negative for difficulty urinating.  Musculoskeletal: Negative.   Skin:  Positive for color change and rash. Negative for pallor and wound.       Rosacea  Allergic/Immunologic: Negative for environmental allergies, food allergies and immunocompromised state.  Neurological:  Negative for dizziness, tremors, seizures, syncope, facial asymmetry, speech difficulty, weakness, light-headedness, numbness and headaches.  Psychiatric/Behavioral:  Positive for agitation, dysphoric mood and sleep disturbance. Negative for behavioral problems, confusion, decreased concentration (c/o due to hypersomnia), hallucinations, self-injury and suicidal ideas. The patient is nervous/anxious. The patient is not hyperactive.     There were no vitals taken for this visit.There is no height or weight on file to calculate BMI.MY CHART VISIT  General Appearance: Casual and Fairly Groomed  Eye Contact:  NA  Speech:  Clear and Coherent  Volume:   variable  Mood:  Angry and Dysphoric  Affect:  Congruent  Thought Process:  Coherent, Goal Directed, and Descriptions of Associations: Intact  Orientation:  Full (Time, Place, and Person)  Thought Content: WDL, Logical, and Obsessions   Suicidal Thoughts:  No  Homicidal Thoughts:  No  Memory:  Affected by SUDS/Psychotic episodes   Judgement:  Impaired  Insight:  Lacking  Psychomotor Activity:  Negative  Concentration:  Concentration: Negative and Attention Span: Negative  Recall:   see memory  Fund of Knowledge:  WDL  Language: Good  Akathisia:  Negative  Handed:  Right  AIMS (  if indicated): not done  Assets:  Desire for Improvement Financial Resources/Insurance Housing Resilience Social  Support Talents/Skills Vocational/Educational  ADL's:  Impaired  Cognition:Impaired  Sleep:  Poor   Screenings: AIMS    Flowsheet Row Video Visit from [facility]20[facility] in [facility] Office Visit from 11[facility]27/2021 in [facility] Office Visit from 11/06/2021 in [facility] Office Visit from 10[facility]23/2021 in [facility] Office Visit from 10/16/2021 in [facility]  AIMS Total Score 0 4 7 5 21       Flowsheet Row ED to Hosp-Admission (Discharged) from 06/20/2022 in [facility]  SURGICAL ED from 06/13/2022 in [facility] Emergency Department at [facility] ED from 05/25/2022 in [facility] Emergency Department at [facility]  C-SSRS RISK CATEGORY Low Risk No Risk No Risk        Assessment  Modanafil obsession-with associated belief that it makes it possible for him to be functional especially at computer for "5-6 hours" Continued freedom from hallucinosis Needs Sleep Study/Specialist evaluation Needs Thyroid panel  and Plan:  \Will palliate his Modanafil obsession as can control his prescription-if he abuses he will be without the medication Refill Doxycycline for rosacea at his request Decrease Haldol to 5mg  and stay-if he has hallucuination goback to 7 mg stat FU 2 weeks-sooner if needed [name], PA-C 10/15/2022, 4:18 PM

## 2022-11-05 ENCOUNTER — Encounter (HOSPITAL_COMMUNITY): Payer: Self-pay

## 2022-11-05 ENCOUNTER — Encounter (HOSPITAL_COMMUNITY): Payer: Self-pay | Admitting: Medical

## 2022-11-05 ENCOUNTER — Telehealth (HOSPITAL_COMMUNITY): Payer: Medicare Other | Admitting: Medical

## 2022-11-05 DIAGNOSIS — Z923 Personal history of irradiation: Secondary | ICD-10-CM

## 2022-11-05 DIAGNOSIS — F1920 Other psychoactive substance dependence, uncomplicated: Secondary | ICD-10-CM | POA: Diagnosis not present

## 2022-11-05 DIAGNOSIS — Z79899 Other long term (current) drug therapy: Secondary | ICD-10-CM

## 2022-11-05 DIAGNOSIS — E039 Hypothyroidism, unspecified: Secondary | ICD-10-CM

## 2022-11-05 DIAGNOSIS — I639 Cerebral infarction, unspecified: Secondary | ICD-10-CM

## 2022-11-05 DIAGNOSIS — F060 Psychotic disorder with hallucinations due to known physiological condition: Secondary | ICD-10-CM | POA: Diagnosis not present

## 2022-11-05 DIAGNOSIS — I10 Essential (primary) hypertension: Secondary | ICD-10-CM

## 2022-11-05 DIAGNOSIS — L719 Rosacea, unspecified: Secondary | ICD-10-CM

## 2022-11-05 DIAGNOSIS — F0634 Mood disorder due to known physiological condition with mixed features: Secondary | ICD-10-CM

## 2022-11-05 DIAGNOSIS — F1910 Other psychoactive substance abuse, uncomplicated: Secondary | ICD-10-CM

## 2022-11-05 DIAGNOSIS — G250 Essential tremor: Secondary | ICD-10-CM

## 2022-11-05 DIAGNOSIS — D320 Benign neoplasm of cerebral meninges: Secondary | ICD-10-CM

## 2022-11-05 DIAGNOSIS — I6381 Other cerebral infarction due to occlusion or stenosis of small artery: Secondary | ICD-10-CM | POA: Diagnosis not present

## 2022-11-05 DIAGNOSIS — N139 Obstructive and reflux uropathy, unspecified: Secondary | ICD-10-CM

## 2022-11-05 DIAGNOSIS — Z8659 Personal history of other mental and behavioral disorders: Secondary | ICD-10-CM

## 2022-11-05 NOTE — Progress Notes (Signed)
BH MD/PA/NP OP Progress Note  11/05/2022 3:26 PM Paul Bray  MRN:  [id number] Virtual Visit via Video Note  I connected with Paul Bray on 11/05/22 at  3:00 PM EDT by a video enabled telemedicine application and verified that I am speaking with the correct person using two identifiers.  Location: Patient: Home Provider: [facility] [place]   I discussed the limitations of evaluation and management by telemedicine and the availability of in person appointments. The patient expressed understanding and agreed to proceed.  History of Present Illness:    Observations/Objective:   Assessment and Plan:   Follow Up Instructions:    I discussed the assessment and treatment plan with the patient. The patient was provided an opportunity to ask questions and all were answered. The patient agreed with the plan and demonstrated an understanding of the instructions.   The patient was advised to call back or seek an in-person evaluation if the symptoms worsen or if the condition fails to improve as anticipated.  I provided 20 minutes of non-face-to-face time during this encounter.   [name], PA-C   Chief Complaint:  Chief Complaint  Patient presents with   Follow-up   Medication Refill   Addiction Problem   Lacunar infarcts with post stroke psychosis   HPI: Paul Bray returns for scheduled 2 week FU for his complex mental health issues that began with his addiction to multiple substances and IVDU involving cocaine and heroin. Around 2010 he began to c/o hypersomnia. Brain scan revealed an oval, smooth,rounded lesion within the right lateral ventricle, in the posterior ports in the lateral ventricle that seems to be on the medial side of the choroid, slightly less than 2 cm in greatest diameter. It has never since July of 2010 changed in size  Records indicate he became convinced (obsessed) that this meningioma was the cause of his ongoing sleep disorder. He persisted in complaining and  in    2018 was given Gamma knife radiation.There was no change in his symptoms.FU studies continued to show a stable 18 mm mass. Gamma Knife tradiation fo benign brain tumor ?2018 Says has records Requesting Psychiatrist/medication mangement for paranoia hx All studies prior to 10/29/2018 reported no intracranial hemorrhage   MRI BRAIN WITHOUT CONTRAST, 10/29/2018  The mass appears to have decreased in size 1 x 1.6 x 1 cm compared to a 2011 MR study where it measured 1.5 x 1.8 x 1 cm.  Remote lacunar infarcts in the left basal ganglia. No evidence of acute abnormality. No significant white matter disease. No evidence of acute infarct. No mass effect, acute hemorrhage, or hydrocephalus. Grossly normal flow-related signal in the major intracranial arteries and dural sinuses.  On 08/03/2018 he initially contacted this provider:because of inability to contact Provider Dr Rayna Sexton treating him at [facility] Dr Rayna Sexton for Opiate dependence MAT Suboxone and C/O of paranoia he is aware of but cant stop/completely control.with rx for Risperdal      Subsequently he established care here and with [facility] (Suboxone program) Since April of 2020 he has had 12 admissions with little success until second to the last when he was started on Haldol after failing second generation antipsychotics. He has had 1 relapse on Haldol after stopping medication while on a low dose taper as well as breaking into his medicine supply managed by his mother due to history of abuse and noncompliance that led to a number of earlier hospitalizations.He took Modafanil and Suboxone the 2 controlled substances in  his medication regimin (indicating he still has an addicted brain). Attempts to get him off Modafanil have met with significant resistance including today. He c/o inability to engage in ADL which include his computer work/skills. He is "miserable" and insisting on going back up on his Modafanil. He says Haldol and  Cogentin contribute to his somnolence/fatigue. He refuses to schedule a sleep study c/o $5000 medical bill he cannot pay. (The Meningioma was discovered when he was scheduled for Sleep Study but was never done and there is no record one was ever done ). He hasn't gotten a Thyroid study to verify this is not a contributing factor. He seemed better on T3/T4 supplementation but was put on T4 alone during one of his hospitalizations.? He continues to report no hallucinations doing well at home    Visit Diagnosis:    ICD-10-CM   1. Polysubstance dependence (HCC)  F19.20     2. Multiple lacunar infarcts (HCC)  I63.81     3. Infarction of left basal ganglia (HCC)  I63.9     4. 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 Drug abuse, IV (HCC)  F19.10     6. Meningioma, cerebral (HCC)  D32.0     7. Status post gamma knife treatment  Z92.3     8. Familial tremor  G25.0     9. Hypothyroidism, unspecified type  E03.9     10. History of ADHD  Z86.59     11. 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 Rosacea  L71.9     13. Essential hypertension  I10     14. Urinary (tract) obstruction  N13.9     15. Medication management  Z79.899       Past Psychiatric History:   Past Psychiatric History:  [facility] Date of recent admission: 10/29/21 Date of recent discharge: 11/01/21 [facility]  [facility] [place] 6/17/-03/04/2020  Pt with Opiate dependence MAT Suboxone at [facility] Dr Rayna Sexton C/O of paranoia he is aware of but cant stop/completely control.Has rx for Risperdal from [employer]. Also hx of Gamma Knife tradiation fo benign brain tumor ?2018 Says has records Requesting Psychiatrist/medication mangement for paranoia hx [facility] OP [place] 08/21/2021 - NOW [facility] INPATIENT  Date of Admission:  10/02/2021 Date of Discharge: 10/13/2021  Date of  Admission:06/13/2022 [facility] ED)  Date of Discharge:06/16/22  Date of Admission: 06/25/2022  Date of Discharge:06/29/2022   [name] 05/27/2022- 06/03/2022   [place] Alcohol intoxication 03/26/2017  Alcohol withdrawal delirium 03/26/2017  Chronic daily headache 02/25/2017  Alcohol use disorder, severe, dependence 02/22/2017  Heroin dependence MAT Methadone [place]/Subutex TBR 01/2018  Anxiety disorder, unspecified 01/20/2017  Paranoia 03/2018    [facility] Jacklynn Bue, NP - 04/12/2017 12:38 PM EST Formatting of this note might be different from the original. [facility] - Substance Abuse PHP/IOP Intake Assessment  BH Formulate an individual and appropriate relapse prevention plan.   BH SU Problem: Formation of a relapse prevention plan    Create Note  Est Patient 1   [facility] Uncomplicated opioid dependence (CMS/HCC) 12/24/2017  1. Discharge from hospital. 2. Follow up at    Past Medical History:  Past Medical History:  Diagnosis Date   ADHD    Anxiety    Brain tumor (HCC)    balance and occ memory issues and headaches  Brain tumor (HCC)    sees [facility] q year   Depression    Headache    migraine and tension   Hypothyroidism    Soft tissue mass    left shoulder   Substance abuse [facility])     Past Surgical History:  Procedure Laterality Date   colonscopy     gamma knife radiation 2018 for brain tumor'     hematoma removed from arm Left    MASS EXCISION Left 06/30/2019   Procedure: EXCISION OF SOFT TISSUE  MASS LEFT SHOULDER;  Surgeon: Darnell Level, MD;  Location: [facility];  Service: General;  Laterality: Left;  LMA    Family Psychiatric History:  Alcoholism and drug abuse on father's side  Family History: History reviewed. No pertinent family history.  Social History:  Social History   Socioeconomic History   Marital status: Single    Spouse name: Not on file   Number of children: Not  on file   Years of education: Not on file   Highest education level: Not on file  Occupational History   Not on file  Tobacco Use   Smoking status: Former    Types: Cigarettes   Smokeless tobacco: Current   Tobacco comments:    quit jan 2020  Vaping Use   Vaping status: Every Day  Substance and Sexual Activity   Alcohol use: Not Currently    Comment: quit Oct 2019   Drug use: Not Currently    Types: Cocaine, IV, Heroin, MDMA (Ecstacy)    Comment: last reported use; cocaine and heroin in 2019; MDMA Feb 2023   Sexual activity: Not Currently  Other Topics Concern   Not on file  Social History Narrative   Not on file   Social Determinants of Health   Financial Resource Strain: Low Risk  (07/10/2020)   Received from [facility] visits prior to 06/27/2022.   Overall Financial Resource Strain (CARDIA)    Difficulty of Paying Living Expenses: Not hard at all  Food Insecurity: No Food Insecurity (07/10/2020)   Received from [facility] visits prior to 06/27/2022.   Hunger Vital Sign    Worried About Running Out of Food in the Last Year: Never true    Ran Out of Food in the Last Year: Never true  Transportation Needs: No Transportation Needs (07/10/2020)   Received from [facility] visits prior to 06/27/2022.   PRAPARE - Administrator, Civil Service (Medical): No    Lack of Transportation (Non-Medical): No  Physical Activity: Unknown (07/10/2020)   Received from [facility] visits prior to 06/27/2022.   Exercise Vital Sign    Days of Exercise per Week: Not on file    Minutes of Exercise per Session: 0 min  Stress: No Stress Concern Present (07/10/2020)   Received from [facility] visits prior to 06/27/2022.   [employer] of Occupational Health - Occupational Stress Questionnaire    Feeling of Stress : Not at all  Social Connections: Unknown (09/07/2021)   Received  from [facility]   Social Network    Social Network: Not on file    Allergies:  Allergies  Allergen Reactions   Ingrezza [Valbenazine Tosylate] Other (See Comments)    Severe tongue chewing at 80 mg Tolerates lower doses   Naloxone Palpitations    Pt report MAT Rx is Subutex/Buprenorphine only   Amphetamine-Dextroamphetamine Other (See  Comments)    unknown    Metabolic Disorder Labs: Lab Results  Component Value Date   HGBA1C 5.0 10/03/2021   MPG 96.8 10/03/2021   No results found for: "PROLACTIN" Lab Results  Component Value Date   CHOL 191 10/03/2021   TRIG 92 10/03/2021   HDL 48 10/03/2021   CHOLHDL 4.0 10/03/2021   VLDL 18 10/03/2021   LDLCALC 125 (H) 10/03/2021   Lab Results  Component Value Date   TSH 3.257 06/22/2022   TSH 1.500 02/04/2022    Therapeutic Level Labs:NA  Current Medications: Current Outpatient Medications  Medication Sig Dispense Refill   benztropine (COGENTIN) 2 MG tablet Take 1 tablet (2 mg total) by mouth 2 (two) times daily. 60 tablet 2   buprenorphine (SUBUTEX) 8 MG SUBL SL tablet Place 8 mg under the tongue 3 (three) times daily.     diphenhydrAMINE (BENADRYL) 50 MG tablet Take 2-4 tablets q6h as needed for restlessness related to Haldol (Patient taking differently: Take 25 mg by mouth every 8 (eight) hours as needed (For restlessness per patient).) 100 tablet 2   doxycycline (VIBRAMYCIN) 50 MG capsule Take 1 capsule (50 mg total) by mouth daily. 90 capsule 1   Fluocinolone Acetonide 0.01 % OIL      gabapentin (NEURONTIN) 400 MG capsule Take 1 capsule (400 mg total) by mouth 2 (two) times daily for 180 doses. 180 capsule 0   haloperidol (HALDOL) 5 MG tablet Take 1 tablet (5 mg total) by mouth at bedtime. 30 tablet 2   hydrOXYzine (ATARAX) 25 MG tablet Take 25 mg by mouth in the morning and at bedtime.     levothyroxine (SYNTHROID) 150 MCG tablet Take 1 tablet (150 mcg total) by mouth daily. 30 tablet 11   metroNIDAZOLE (METROGEL) 0.75  % gel      metroNIDAZOLE (METROGEL) 1 % gel Apply topically daily. 45 g 0   modafinil (PROVIGIL) 200 MG tablet Take 3 tablets (600 mg total) by mouth daily. 90 tablet 2   nicotine (NICODERM CQ - DOSED IN MG/24 HOURS) 21 mg/24hr patch Place 21 mg onto the skin daily.     tamsulosin (FLOMAX) 0.4 MG CAPS capsule Take by mouth.     No current facility-administered medications for this visit.      Psychiatric Specialty Exam: Review of Systems  Constitutional:  Negative for activity change, appetite change, chills, diaphoresis, fatigue, fever and unexpected weight change.  HENT: Negative.    Eyes: Negative.   Respiratory: Negative.    Cardiovascular: Negative.   Gastrointestinal: Negative.   Endocrine: Negative.   Genitourinary:  Positive for difficulty urinating (improved).  Musculoskeletal: Negative.   Skin: Negative.   Allergic/Immunologic: Negative.   Neurological:  Positive for tremors (Improved). Negative for dizziness, seizures, syncope, facial asymmetry, speech difficulty, weakness, light-headedness, numbness and headaches.  Hematological: Negative.   Psychiatric/Behavioral:  Positive for agitation, dysphoric mood and sleep disturbance. Negative for behavioral problems, confusion, decreased concentration, hallucinations, self-injury and suicidal ideas. The patient is not nervous/anxious and is not hyperactive.     There were no vitals taken for this visit.There is no height or weight on file to calculate BMI.MY CHART VISIT  General Appearance: Fairly Groomed  Eye Contact:  NA  Speech:  Clear and Coherent  Volume:   variable  Mood:  Angry and Dysphoric  Affect:  Congruent  Thought Process:  Coherent, Goal Directed, and Descriptions of Associations: Intact  Orientation:  Full (Time, Place, and Person)  Thought Content: WDL, Logical, and  Obsessions   Suicidal Thoughts:  No  Homicidal Thoughts:  No  Memory:  Affected by SUDS/Psychotic episodes   Judgement:  Improved  Insight:   Lacking  Psychomotor Activity:  Negative  Concentration:  Concentration: Negative and Attention Span: Negative  Recall:   see memory  Fund of Knowledge:  WDL  Language: Good  Akathisia:  Grossly negatiive  Handed:  Right  AIMS (if indicated): not done  Assets:  Desire for Improvement Financial Resources/Insurance Housing Resilience Social Support Talents/Skills Vocational/Educational  ADL's:  Improved/Intact but medications are locked up  Cognition:Impaired  Sleep:  Poor      AIMS    Flowsheet Row Video [facility] Office Visit from 11/27/2021 in [facility] Office Visit from 11/06/2021 in [facility] Office Visit from 10[facility]23/2021 in [facility] Office Visit from 10/16/2021 in [facility]  AIMS Total Score 0 4 7 5 21       Flowsheet Row ED to Hosp-Admission (Discharged) from 06/20/2022 in [place] [facility]  SURGICAL ED from 06/13/2022 in [facility] Emergency Department at [facility] ED from 05/25/2022 in [facility] Department at [facility]  C-SSRS RISK CATEGORY Low Risk No Risk No Risk        Assessment  Stable with improvement  and Plan: Continues current meds. FU 4 weeks  Collaboration of Care: Collaboration of Care: Medication Management AEB    Patient/Guardian was advised Release of Information must be obtained prior to any record release in order to collaborate their care with an outside provider. Patient/Guardian was advised if they have not already done so to contact the registration department to sign all necessary forms in order for [place] to release information regarding their care.   Consent: Patient/Guardian gives verbal consent for treatment and assignment of benefits for services provided during this  visit. Patient/Guardian expressed understanding and agreed to proceed.    [name], PA-C 11/05/2022, 3:26 PM

## 2022-11-12 ENCOUNTER — Ambulatory Visit (HOSPITAL_COMMUNITY): Payer: Medicare Other | Admitting: Medical

## 2022-12-03 ENCOUNTER — Telehealth (HOSPITAL_BASED_OUTPATIENT_CLINIC_OR_DEPARTMENT_OTHER): Payer: Medicare Other | Admitting: Medical

## 2022-12-03 ENCOUNTER — Encounter (HOSPITAL_COMMUNITY): Payer: Self-pay | Admitting: Medical

## 2022-12-03 DIAGNOSIS — F060 Psychotic disorder with hallucinations due to known physiological condition: Secondary | ICD-10-CM

## 2022-12-03 DIAGNOSIS — I10 Essential (primary) hypertension: Secondary | ICD-10-CM

## 2022-12-03 DIAGNOSIS — Z79899 Other long term (current) drug therapy: Secondary | ICD-10-CM

## 2022-12-03 DIAGNOSIS — I6381 Other cerebral infarction due to occlusion or stenosis of small artery: Secondary | ICD-10-CM | POA: Diagnosis not present

## 2022-12-03 DIAGNOSIS — Z923 Personal history of irradiation: Secondary | ICD-10-CM

## 2022-12-03 DIAGNOSIS — D320 Benign neoplasm of cerebral meninges: Secondary | ICD-10-CM

## 2022-12-03 DIAGNOSIS — E039 Hypothyroidism, unspecified: Secondary | ICD-10-CM

## 2022-12-03 DIAGNOSIS — F1920 Other psychoactive substance dependence, uncomplicated: Secondary | ICD-10-CM | POA: Diagnosis not present

## 2022-12-03 DIAGNOSIS — I639 Cerebral infarction, unspecified: Secondary | ICD-10-CM

## 2022-12-03 DIAGNOSIS — F0634 Mood disorder due to known physiological condition with mixed features: Secondary | ICD-10-CM

## 2022-12-03 DIAGNOSIS — F1910 Other psychoactive substance abuse, uncomplicated: Secondary | ICD-10-CM

## 2022-12-03 DIAGNOSIS — Z8659 Personal history of other mental and behavioral disorders: Secondary | ICD-10-CM

## 2022-12-03 DIAGNOSIS — L719 Rosacea, unspecified: Secondary | ICD-10-CM

## 2022-12-03 DIAGNOSIS — G250 Essential tremor: Secondary | ICD-10-CM

## 2022-12-03 DIAGNOSIS — N139 Obstructive and reflux uropathy, unspecified: Secondary | ICD-10-CM

## 2022-12-03 MED ORDER — BENZTROPINE MESYLATE 2 MG PO TABS: 2.0000 mg | ORAL_TABLET | Freq: Two times a day (BID) | ORAL | 2 refills | Status: DC

## 2022-12-03 MED ORDER — GABAPENTIN 400 MG PO CAPS: 400.0000 mg | ORAL_CAPSULE | Freq: Two times a day (BID) | ORAL | 0 refills | Status: DC

## 2022-12-03 NOTE — Progress Notes (Signed)
BH MD/PA/NP OP Progress Note  12/03/2022 3:15 PM Paul Bray  MRN:  [id number] Virtual Visit via Video Note  I connected with Paul Bray on 12/03/22 at  3:00 PM EDT by a video enabled telemedicine application and verified that I am speaking with the correct person using two identifiers.  Location: Patient: At home Provider: [facility] [place]   I discussed the limitations of evaluation and management by telemedicine and the availability of in person appointments. The patient expressed understanding and agreed to proceed.   History of Present Illness:See EPIC note    Observations/Objective:See EPIC note   Assessment and Plan:See EPIC note   Follow Up Instructions:See EPIC note    I discussed the assessment and treatment plan with the patient. The patient was provided an opportunity to ask questions and all were answered. The patient agreed with the plan and demonstrated an understanding of the instructions.   The patient was advised to call back or seek an in-person evaluation if the symptoms worsen or if the condition fails to improve as anticipated.  I provided 20 minutes of non-face-to-face time during this encounter.   Paul Morn, PA-C   Chief Complaint:  Chief Complaint  Patient presents with   Follow-up   Addiction Problem   Lacunar infarcts   Post stroke psychosis   Fatigue   Anxiety   Medication Refill   HPI: Paul Bray returns for 1 month FU having been followed on a weekly basis since Feb 28 S/P psychotic relapse due to noncompliance with his medications that had brought stability after a prior psychotic episode. 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 His dog has turned out to be an excellent pet and companion. He is getting along at home. He has notb gotten his Thyroid study yet. Medications are in compliance and he  has no complaints regarding same  Visit Diagnosis:    ICD-10-CM   1. Polysubstance dependence (HCC)  F19.20     2. Multiple lacunar infarcts (HCC)  I63.81     3. Infarction of left basal ganglia (HCC)  I63.9     4. 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 Drug abuse, IV (HCC)  F19.10     6. Meningioma, cerebral (HCC)  D32.0     7. Status post gamma knife treatment  Z92.3     8. Familial tremor  G25.0     9. Hypothyroidism, unspecified type  E03.9     10. History of ADHD  Z86.59     11. 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 Rosacea  L71.9     13. Essential hypertension  I10     14. Urinary (tract) obstruction  N13.9     15. Medication management  Z79.899       Past Psychiatric History: [facility] Date of recent admission: 10/29/21 Date of recent discharge: 11/01/21 [facility]  [facility] OP [place] 6/17/-03/04/2020  Pt with Opiate dependence MAT Suboxone at [facility] Dr Paul Bray C/O of paranoia he is aware of but cant stop/completely control.Has rx for Risperdal from [employer]. Also hx of Gamma Knife tradiation fo benign brain tumor ?2018 Says has records Requesting Psychiatrist/medication mangement for paranoia hx [facility] OP  [place] 08/21/2021 - NOW [facility] INPATIENT  Date of Admission:  10/02/2021 Date of Discharge: 10/13/2021  Date of Admission:06/13/2022 [facility] ED)  Date of Discharge:06/16/22  Date of Admission: 06/25/2022  Date of Discharge:06/29/2022   [name] 05/27/2022- 06/03/2022   [place] Alcohol intoxication 03/26/2017  Alcohol withdrawal delirium 03/26/2017  Chronic daily headache 02/25/2017  Alcohol use disorder, severe, dependence 02/22/2017  Heroin dependence MAT Methadone Metro/Subutex TBR 01/2018  Anxiety disorder, unspecified 01/20/2017  Paranoia 03/2018    [facility] Paul Bue, NP - 04/12/2017  12:38 PM EST Formatting of this note might be different from the original. [facility] - Substance Abuse PHP/IOP Intake Assessment  BH Formulate an individual and appropriate relapse prevention plan.   BH SU Problem: Formation of a relapse prevention plan    [facility] Uncomplicated opioid dependence (CMS/HCC) 12/24/2017  1. Discharge from hospital. 2. Follow up at [facility]    Past Medical History:  Past Medical History:  Diagnosis Date   ADHD    Anxiety    Brain tumor (HCC)    balance and occ memory issues and headaches   Brain tumor (HCC)    sees [facility] q year   Depression    Headache    migraine and tension   Hypothyroidism    Soft tissue mass    left shoulder   Substance abuse (HCC)     Past Surgical History:  Procedure Laterality Date   colonscopy     gamma knife radiation 2018 for brain tumor'     hematoma removed from arm Left    MASS EXCISION Left 06/30/2019   Procedure: EXCISION OF SOFT TISSUE  MASS LEFT SHOULDER;  Surgeon: Paul Level, MD;  Location: [facility];  Service: General;  Laterality: Left;  LMA    Family Psychiatric History: Alcoholism and drug abuse on father's side   Family History: No family history on file.  Social History:  Social History   Socioeconomic History   Marital status: Single    Spouse name: NA   Number of children: None   Years of education: 12   Highest education Bray: HS  Occupational History   Quarry manager until disabled  Tobacco Use   Smoking status: Vapes    Types: Cigarettes   Smokeless tobacco: Current   Tobacco comments:      Vaping Use   Vaping status: Every Day  Substance and Sexual Activity   Alcohol use: Not Currently    Comment: quit Oct 2019   Drug use: Not Currently    Types: Cocaine, IV, Heroin, MDMA (Ecstacy)    Comment: last reported use; cocaine and heroin in 2019; MDMA Feb 2023   Sexual activity: Not Currently  Other Topics Concern    Not on file  Social History Narrative   Not on file   Social Determinants of Health   Financial Resource Strain: Low Risk  (07/10/2020)   [facility] prior to 06/27/2022., [facility] visits prior to 06/27/2022.   Overall Financial Resource Strain (CARDIA)    Difficulty of Paying Living Expenses: Not hard at all  Food Insecurity: No Food Insecurity (07/10/2020)   [name] visits prior to 06/27/2022., [facility] visits prior to 06/27/2022.   Hunger Vital Sign    Worried About Running Out of Food in the Last Year: Never true    Ran Out of Food  in the Last Year: Never true  Transportation Needs: No Transportation Needs (07/10/2020)   [facility] from [facility] visits prior to 06/27/2022., [facility] visits prior to 06/27/2022.   PRAPARE - Administrator, Civil Service (Medical): No    Lack of Transportation (Non-Medical): No  Physical Activity: Unknown (07/10/2020)   [facility] visits prior to 06/27/2022., [facility] visits prior to 06/27/2022.   Exercise Vital Sign    Days of Exercise per Week: Not on file    Minutes of Exercise per Session: 0 min  Stress: No Stress Concern Present (07/10/2020)   [facility] prior to 06/27/2022., [facility] visits prior to 06/27/2022.   [employer] of Occupational Health - Occupational Stress Questionnaire    Feeling of Stress : Not at all  Social Connections: Unknown (09/07/2021)   Received from [facility]   Social Network    Social Network: Not on file    Allergies:  Allergies  Allergen Reactions   Ingrezza [Valbenazine Tosylate] Other (See Comments)    Severe tongue chewing at 80 mg Tolerates lower doses   Naloxone Palpitations    Pt report MAT Rx is  Subutex/Buprenorphine only   Amphetamine-Dextroamphetamine Other (See Comments)    unknown    Metabolic Disorder Labs: Lab Results  Component Value Date   HGBA1C 5.0 10/03/2021   MPG 96.8 10/03/2021   No results found for: "PROLACTIN" Lab Results  Component Value Date   CHOL 191 10/03/2021   TRIG 92 10/03/2021   HDL 48 10/03/2021   CHOLHDL 4.0 10/03/2021   VLDL 18 10/03/2021   LDLCALC 125 (H) 10/03/2021   Lab Results  Component Value Date   TSH 3.257 06/22/2022   TSH 1.500 02/04/2022    Therapeutic Bray Labs:NA  Current Medications: Current Outpatient Medications  Medication Sig Dispense Refill   benztropine (COGENTIN) 2 MG tablet Take 1 tablet (2 mg total) by mouth 2 (two) times daily. 60 tablet 2   buprenorphine (SUBUTEX) 8 MG SUBL SL tablet Place 8 mg under the tongue 3 (three) times daily.     diphenhydrAMINE (BENADRYL) 50 MG tablet Take 2-4 tablets q6h as needed for restlessness related to Haldol (Patient taking differently: Take 25 mg by mouth every 8 (eight) hours as needed (For restlessness per patient).) 100 tablet 2   doxycycline (VIBRAMYCIN) 50 MG capsule Take 1 capsule (50 mg total) by mouth daily. 90 capsule 1   Fluocinolone Acetonide 0.01 % OIL      gabapentin (NEURONTIN) 400 MG capsule Take 1 capsule (400 mg total) by mouth 2 (two) times daily for 180 doses. 180 capsule 0   haloperidol (HALDOL) 5 MG tablet Take 1 tablet (5 mg total) by mouth at bedtime. 30 tablet 2   hydrOXYzine (ATARAX) 25 MG tablet Take 25 mg by mouth in the morning and at bedtime.     levothyroxine (SYNTHROID) 150 MCG tablet Take 1 tablet (150 mcg total) by mouth daily. 30 tablet 11   metroNIDAZOLE (METROGEL) 0.75 % gel      metroNIDAZOLE (METROGEL) 1 % gel Apply topically daily. 45 g 0   modafinil (PROVIGIL) 200 MG tablet Take 3 tablets (600 mg total) by mouth daily. 90 tablet 2   nicotine (NICODERM CQ - DOSED IN MG/24 HOURS) 21 mg/24hr patch Place 21 mg onto the skin daily.      tamsulosin (  FLOMAX) 0.4 MG CAPS capsule Take by mouth.     No current facility-administered medications for this visit.   Musculoskeletal: Strength & Muscle Tone: Telepsych visit-Grossly normal Musculoskeletal and cranial nerve inspections Gait & Station: NA Patient leans: N/A  Psychiatric Specialty Exam: Review of Systems  Constitutional:  Positive for fatigue (Chronic but no complaint today). Negative for activity change (Says its best he has felt in years), appetite change, chills, diaphoresis, fever and unexpected weight change.  Endocrine: Negative for cold intolerance, heat intolerance, polydipsia, polyphagia and polyuria.  Genitourinary:  Negative for decreased urine volume, difficulty urinating, dysuria, frequency, hematuria and urgency.  Skin:  Positive for color change and rash. Negative for pallor and wound.       Rosacea  Neurological:  Negative for dizziness, tremors, seizures, syncope, facial asymmetry, speech difficulty, weakness, light-headedness, numbness and headaches.  Psychiatric/Behavioral:  Negative for agitation, behavioral problems, confusion, decreased concentration, dysphoric mood, hallucinations, self-injury, sleep disturbance and suicidal ideas. The patient is not hyperactive.   All other systems reviewed and are negative.   There were no vitals taken for this visit.There is no height or weight on file to calculate BMI.My Chart Visit  General Appearance: Casual and Neat  Eye Contact:  Good  Speech:  Clear and Coherent  Volume:  Normal  Mood:  Euthymic  Affect:  Congruent  Thought Process:  Coherent, Goal Directed, and Descriptions of Associations: Intact  Orientation:  Full (Time, Place, and Person)  Thought Content: WDL and Logical   Suicidal Thoughts:  No  Homicidal Thoughts:  No  Memory:  Affected by SUDS/Psychotic episodes   Judgement:  Other:  Improving  Insight:   Limited  Psychomotor Activity:  Negative  Concentration:  Concentration: Good and  Attention Span: Good  Recall:  Fair  Fund of Knowledge:  WDL  Language: Good  Akathisia:  NA  Handed:  Right  AIMS (if indicated): not done  Assets:  Desire for Improvement Financial Resources/Insurance Housing Resilience Social Support Talents/Skills Transportation  ADL's:  Intact  Cognition: Impaired,  Moderate  Sleep:   Chronic impairment-sleep study pending   Screenings: AIMS    Flowsheet Row Video [facility] Office Visit from 11/27/2021 in [facility] Office Visit from 11/06/2021 in [facility] Office Visit from 10[facility]23/2021 in [facility] Office Visit from 10/16/2021 in [facility]  AIMS Total Score 0 4 7 5 21       Flowsheet Row ED to Hosp-Admission (Discharged) from 06/20/2022 in [place] [facility] ED from 06/13/2022 in [facility] at [facility] ED from 05/25/2022 in [facility] Department at [facility]  C-SSRS RISK CATEGORY Low Risk No Risk No Risk        Assessment : Marked improvement   and Plan: Continue current medications. Refills done.Fu 1 month-sooner if needed   Paul Morn, PA-C 12/03/2022, 3:15 PM

## 2022-12-31 ENCOUNTER — Telehealth (HOSPITAL_BASED_OUTPATIENT_CLINIC_OR_DEPARTMENT_OTHER): Payer: Medicare Other | Admitting: Medical

## 2022-12-31 DIAGNOSIS — D320 Benign neoplasm of cerebral meninges: Secondary | ICD-10-CM

## 2022-12-31 DIAGNOSIS — Z8659 Personal history of other mental and behavioral disorders: Secondary | ICD-10-CM | POA: Diagnosis not present

## 2022-12-31 DIAGNOSIS — F0634 Mood disorder due to known physiological condition with mixed features: Secondary | ICD-10-CM

## 2022-12-31 DIAGNOSIS — F060 Psychotic disorder with hallucinations due to known physiological condition: Secondary | ICD-10-CM | POA: Diagnosis not present

## 2022-12-31 DIAGNOSIS — G250 Essential tremor: Secondary | ICD-10-CM

## 2022-12-31 DIAGNOSIS — L719 Rosacea, unspecified: Secondary | ICD-10-CM

## 2022-12-31 DIAGNOSIS — I10 Essential (primary) hypertension: Secondary | ICD-10-CM

## 2022-12-31 DIAGNOSIS — Z923 Personal history of irradiation: Secondary | ICD-10-CM

## 2022-12-31 DIAGNOSIS — F1910 Other psychoactive substance abuse, uncomplicated: Secondary | ICD-10-CM

## 2022-12-31 DIAGNOSIS — G4710 Hypersomnia, unspecified: Secondary | ICD-10-CM

## 2022-12-31 DIAGNOSIS — E039 Hypothyroidism, unspecified: Secondary | ICD-10-CM

## 2022-12-31 DIAGNOSIS — F1920 Other psychoactive substance dependence, uncomplicated: Secondary | ICD-10-CM

## 2022-12-31 DIAGNOSIS — I639 Cerebral infarction, unspecified: Secondary | ICD-10-CM

## 2022-12-31 DIAGNOSIS — I6381 Other cerebral infarction due to occlusion or stenosis of small artery: Secondary | ICD-10-CM

## 2022-12-31 DIAGNOSIS — N139 Obstructive and reflux uropathy, unspecified: Secondary | ICD-10-CM

## 2022-12-31 DIAGNOSIS — Z79899 Other long term (current) drug therapy: Secondary | ICD-10-CM

## 2023-01-04 ENCOUNTER — Encounter (HOSPITAL_COMMUNITY): Payer: Self-pay | Admitting: Medical

## 2023-01-04 MED ORDER — HALOPERIDOL 5 MG PO TABS: 5.0000 mg | ORAL_TABLET | Freq: Every day | ORAL | 2 refills | Status: DC

## 2023-01-04 MED ORDER — MODAFINIL 200 MG PO TABS: 600.0000 mg | ORAL_TABLET | Freq: Every day | ORAL | 2 refills | Status: DC

## 2023-01-04 NOTE — Progress Notes (Addendum)
Virtual Visit via Telephone Note  I connected with Paul Bray on 01/04/23 at  5:00 PM EDT by telephone and verified that I am speaking with the correct person using two identifiers.  Location: Patient: Home Provider: Working [employer] Unable to connect to video   I discussed the limitations, risks, security and privacy concerns of performing an evaluation and management service by telephone and the availability of in person appointments. I also discussed with the patient that there may be a patient responsible charge related to this service. The patient expressed understanding and agreed to proceed.  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  He continues to report being psychosis free. His routine is stable at home and reltionship with mother is good atthis time.  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 He had no complaints regarding his medications. Wnders if he could lower hi Haldol dose further? He maintains [facility]   Medications: benztropine 2 MG tablet Commonly known as: COGENTIN Take 1 tablet (2 mg total) by mouth 2 (two) times daily.  buprenorphine 8 MG Subl SL tablet Commonly  known as: SUBUTEX Place 8 mg under the tongue 3 (three) times daily.  diphenhydrAMINE 50 MG tablet Commonly known as: BENADRYL Take 2-4 tablets q6h as needed for restlessness related to Haldol According to our records, you may have been taking this medication differently.  doxycycline 50 MG capsule Commonly known as: VIBRAMYCIN Take 1 capsule (50 mg total) by mouth daily.  Fluocinolone Acetonide 0.01 % Oil   gabapentin 400 MG capsule Commonly known as: NEURONTIN Take 1 capsule (400 mg total) by mouth 2 (two) times daily for 180 doses.  haloperidol 5 MG tablet Commonly known as: HALDOL Take 1 tablet (5 mg total) by mouth at bedtime.  hydrOXYzine 25 MG tablet Commonly known as: ATARAX Take 25 mg by mouth in the morning and at bedtime.  levothyroxine 150 MCG tablet Commonly known as: Synthroid Take 1 tablet (150 mcg total) by mouth daily.  * metroNIDAZOLE 0.75 % gel Commonly known as: METROGEL   * metroNIDAZOLE 1 % gel Commonly known as: Metrogel Apply topically daily.  modafinil 200 MG tablet Commonly known as: PROVIGIL Take 3 tablets (600 mg total) by mouth daily.  nicotine 21 mg/24hr patch Commonly known as: NICODERM CQ - dosed in mg/24 hours Place 21 mg onto the skin daily.  tamsulosin 0.4 MG Caps capsule Commonly known as: FLOMAX Take by mouth   Observations/Objective: Psychiatric Specialty Exam: Review of Systems CONSTITUTIONAL: No complaints Negative for activity change appetite change, chills, diaphoresis, fever and unexpected weight change.  Endocrine: Negative for cold intolerance, heat intolerance, polydipsia, polyphagia and polyuria.  Genitourinary:  Negative for decreased urine volume, difficulty urinating, dysuria, frequency, hematuria and urgency  Skin:  Positive for color change and rash. Negative for pallor and wound.  Rosacea  NERVOUS SYSTEM: Negative for dizziness, tremors, seizures, syncope, facial asymmetry, speech difficulty, weakness, light-headedness,  numbness and headaches.   MENTAL HEALTH:Psychiatric/Behavioral:  Negative for agitation, behavioral problems, confusion, decreased concentration, dysphoric mood, hallucinations, self-injury, sleep disturbance and suicidal ideas. The patient is not hyperactive.   All other systems reviewed and are negative.   There were no vitals taken for this visit.There is no height or weight on file to calculate BMI.My Chart visit  General Appearance: NA  Eye Contact:  NA  Speech:  Clear and Coherent and Normal Rate  Volume:  Normal  Mood:  Euthymic  Affect:  NA  Thought Process:  Coherent, Goal Directed, and Descriptions of Associations: Intact  Orientation:  Full (Time, Place, and Person)  Thought Content: WDL and Logical   Suicidal Thoughts:  No  Homicidal Thoughts:  No  Memory:  No change-  Affected by SUDS/Psychotic episodes   Judgement:  Other:  Improved  Insight:  Continues to have mixed ability-remains at risk for substance abuse  Psychomotor Activity:  NA  Concentration:  Concentration: Good  Recall:  Affected by substance use and strokes and Psychotic episodes.Present ability seems good  Fund of Knowledge: WDL  Language: Good  Akathisia:  NA  Handed:  Right  AIMS (if indicated): not done  Assets:   Desire for Improvement Financial Resources/Insurance Housing Resilience Social Support Talents/Skills Transportation  ADL's:  Intact  Cognition: Impaired,  Mild and Moderate  Sleep:    Chronic impairment-sleep study pending       Screenings:NA  Assessment : Stable  and Plan:Continue current medications RF Modafanil and Haldol FU with other providers as scheduled FU here 1 month   Follow Up Instructions: Return 1 month sooner if needed.Continue to abstain Contimue ADL  ADDENDUM;Discussed pt query about lowering the dose of Haldol. Given previuos history of failure to abstain at 2 mg Haldol after previous remission which led to prolonged psychosis I adb vised pt that do not  feel this is advisable.Suggested that perhaps after a longer period of time (1 yr?) we might consider this.   I discussed the assessment and treatment plan with the patient. The patient was provided an opportunity to ask questions and all were answered. The patient agreed with the plan and demonstrated an understanding of the instructions.   The patient was advised to call back or seek an in-person evaluation if the symptoms worsen or if the condition fails to improve as anticipated.  I provided 20 minutes of non-face-to-face time during this encounter.   [name], PA-C

## 2023-02-25 ENCOUNTER — Encounter (HOSPITAL_COMMUNITY): Payer: Self-pay | Admitting: Medical

## 2023-02-25 ENCOUNTER — Telehealth (HOSPITAL_COMMUNITY): Payer: Medicare Other | Admitting: Medical

## 2023-02-25 DIAGNOSIS — I6381 Other cerebral infarction due to occlusion or stenosis of small artery: Secondary | ICD-10-CM

## 2023-02-25 DIAGNOSIS — I639 Cerebral infarction, unspecified: Secondary | ICD-10-CM | POA: Diagnosis not present

## 2023-02-25 DIAGNOSIS — G250 Essential tremor: Secondary | ICD-10-CM

## 2023-02-25 DIAGNOSIS — D320 Benign neoplasm of cerebral meninges: Secondary | ICD-10-CM

## 2023-02-25 DIAGNOSIS — G4730 Sleep apnea, unspecified: Secondary | ICD-10-CM

## 2023-02-25 DIAGNOSIS — F1920 Other psychoactive substance dependence, uncomplicated: Secondary | ICD-10-CM

## 2023-02-25 DIAGNOSIS — L719 Rosacea, unspecified: Secondary | ICD-10-CM

## 2023-02-25 DIAGNOSIS — Z8659 Personal history of other mental and behavioral disorders: Secondary | ICD-10-CM

## 2023-02-25 DIAGNOSIS — F060 Psychotic disorder with hallucinations due to known physiological condition: Secondary | ICD-10-CM

## 2023-02-25 DIAGNOSIS — Z79899 Other long term (current) drug therapy: Secondary | ICD-10-CM

## 2023-02-25 DIAGNOSIS — F0634 Mood disorder due to known physiological condition with mixed features: Secondary | ICD-10-CM

## 2023-02-25 DIAGNOSIS — I10 Essential (primary) hypertension: Secondary | ICD-10-CM

## 2023-02-25 DIAGNOSIS — N139 Obstructive and reflux uropathy, unspecified: Secondary | ICD-10-CM

## 2023-02-25 DIAGNOSIS — F1910 Other psychoactive substance abuse, uncomplicated: Secondary | ICD-10-CM

## 2023-02-25 DIAGNOSIS — E039 Hypothyroidism, unspecified: Secondary | ICD-10-CM

## 2023-02-25 DIAGNOSIS — G4710 Hypersomnia, unspecified: Secondary | ICD-10-CM

## 2023-02-25 DIAGNOSIS — Z923 Personal history of irradiation: Secondary | ICD-10-CM

## 2023-02-25 MED ORDER — BENZTROPINE MESYLATE 2 MG PO TABS: 2.0000 mg | ORAL_TABLET | Freq: Two times a day (BID) | ORAL | 2 refills | Status: DC

## 2023-02-25 MED ORDER — HALOPERIDOL 5 MG PO TABS: 5.0000 mg | ORAL_TABLET | Freq: Every day | ORAL | 0 refills | Status: DC

## 2023-02-25 MED ORDER — GABAPENTIN 400 MG PO CAPS: 400.0000 mg | ORAL_CAPSULE | Freq: Two times a day (BID) | ORAL | 0 refills | Status: DC

## 2023-02-25 MED ORDER — MODAFINIL 200 MG PO TABS: 600.0000 mg | ORAL_TABLET | Freq: Every day | ORAL | 1 refills | Status: DC

## 2023-02-25 NOTE — Progress Notes (Signed)
BH MD/PA/NP OP Progress Note  02/25/2023 3:59 PM [name]  MRN:  [id number] Virtual Visit via Video Note  I connected with Paul Bray on 02/25/23 at  3:00 PM EDT by a video enabled telemedicine application and verified that I am speaking with the correct person using two identifiers.  Location: Patient: Home Provider: [facility]   I discussed the limitations of evaluation and management by telemedicine and the availability of in person appointments. The patient expressed understanding and agreed to proceed.   History of Present Illness:See EPIC note    Observations/Objective:See EPIC note   Assessment and Plan:See EPIC note   Follow Up Instructions:See EPIC note    I discussed the assessment and treatment plan with the patient. The patient was provided an opportunity to ask questions and all were answered. The patient agreed with the plan and demonstrated an understanding of the instructions.   The patient was advised to call back or seek an in-person evaluation if the symptoms worsen or if the condition fails to improve as anticipated.  I provided 20 minutes of non-face-to-face time during this encounter.   Paul Morn, PA-C   Chief Complaint:  Chief Complaint  Patient presents with   Follow-up   Addiction Problem   Medication Refill   Trauma   Anxiety   HPI: Paul Bray is seen almost 2 mos from last visit having been followed on a weekly basis since Feb 28 S/P psychotic relapse due to noncompliance with his medications that had brought stability after a prior psychotic episode.  He reports stable mental health since that visit with no episodes of paranoia/hallucinosis taking medications (Haldol 5mg  with 2 mg Cogentin) as prescribed. He no longer c/o hypersomnia/fatigue with Modanafil dosage.He denies tremor/TDK symptoms. 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 His home life continues to be peaceful living with  Mom.  Visit Diagnosis:    ICD-10-CM   1. Polysubstance dependence (HCC)  F19.20     2. Multiple lacunar infarcts (HCC)  I63.81     3. Infarction of left basal ganglia (HCC)  I63.9     4. 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 Drug abuse, IV (HCC)  F19.10     6. Meningioma, cerebral (HCC)  D32.0     7. Status post gamma knife treatment  Z92.3     8. Familial tremor  G25.0     9. Hypothyroidism, unspecified type  E03.9     10. History of ADHD  Z86.59     11. 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 Rosacea  L71.9     13. Essential hypertension  I10     14. Urinary (tract) obstruction  N13.9     15. Hypersomnia with sleep apnea  G47.10    G47.30     16. Medication management  Z79.899            Past Psychiatric History: [facility] Date of recent admission: 10/29/21 Date of recent discharge: 11/01/21 [facility]  [facility] [place] 6/17/-03/04/2020  Pt with Opiate dependence MAT Suboxone at [facility] Dr Rayna Sexton C/O of paranoia he is aware of but cant stop/completely control.Has rx for Risperdal from [employer]. Also hx of Gamma Knife tradiation fo benign brain tumor ?2018 Says has records Requesting Psychiatrist/medication mangement for paranoia hx [facility]  OP [place] 08/21/2021 - NOW [facility] INPATIENT  Date of Admission:  10/02/2021 Date of Discharge: 10/13/2021  Date of Admission:06/13/2022 [facility] ED)  Date of Discharge:06/16/22  Date of Admission: 06/25/2022  Date of Discharge:06/29/2022   [name] 05/27/2022- 06/03/2022   [facility] Alcohol intoxication 03/26/2017  Alcohol withdrawal delirium 03/26/2017  Chronic daily headache 02/25/2017  Alcohol use disorder, severe, dependence 02/22/2017  Heroin dependence MAT Methadone Metro/Subutex TBR 01/2018  Anxiety disorder, unspecified 01/20/2017  Paranoia 03/2018    [facility]  Psychiatry Jacklynn Bue, NP - 04/12/2017 12:38 PM EST Formatting of this note might be different from the original. [facility] Psychiatry - Substance Abuse PHP/IOP Intake Assessment  BH Formulate an individual and appropriate relapse prevention plan.   BH SU Problem: Formation of a relapse prevention plan    [facility] Uncomplicated opioid dependence (CMS/HCC) 12/24/2017  1. Discharge from hospital. 2. Follow up at [facility]      Past Medical History:  CARE EVERYWHERE REVIEWED No new entries     Past Medical History:  Diagnosis Date   ADHD     Anxiety     Brain tumor (HCC)      balance and occ memory issues and headaches   Brain tumor (HCC)      sees [facility] q year   Depression     Headache      migraine and tension   Hypothyroidism     Soft tissue mass      left shoulder   Substance abuse (HCC)               Past Surgical History:  Procedure Laterality Date   colonscopy       gamma knife radiation 2018 for brain tumor'       hematoma removed from arm Left     MASS EXCISION Left 06/30/2019    Procedure: EXCISION OF SOFT TISSUE  MASS LEFT SHOULDER;  Surgeon: Darnell Level, MD;  Location: [facility];  Service: General;  Laterality: Left;  LMA          Family Psychiatric History: Alcoholism and drug abuse on father's side    Family History:  No family history on file.   Social History:  Social History   Socioeconomic History   Marital status: Single    Spouse name: Not on file   Number of children: Not on file   Years of education: Not on file   Highest education level: Not on file  Occupational History   Not on file  Tobacco Use   Smoking status: Former    Types: Cigarettes   Smokeless tobacco: Current   Tobacco comments:    quit jan 2020  Vaping Use   Vaping status: Every Day  Substance and Sexual Activity   Alcohol use: Not Currently    Comment: quit Oct 2019   Drug use: Not Currently    Types:  Cocaine, IV, Heroin, MDMA (Ecstacy)    Comment: last reported use; cocaine and heroin in 2019; MDMA Feb 2023   Sexual activity: Not Currently  Other Topics Concern   Not on file  Social History Narrative   Not on file   Social Determinants of Health   Financial Resource Strain: Low Risk  (07/10/2020)   [facility] prior to 06/27/2022., [facility] visits prior to 06/27/2022.   Overall Financial Resource Strain (CARDIA)    Difficulty of Paying Living Expenses: Not  hard at all  Food Insecurity: No Food Insecurity (07/10/2020)   Received from [facility] visits prior to 06/27/2022., [facility] visits prior to 06/27/2022.   Hunger Vital Sign    Worried About Running Out of Food in the Last Year: Never true    Ran Out of Food in the Last Year: Never true  Transportation Needs: No Transportation Needs (07/10/2020)   Received from [facility] visits prior to 06/27/2022., [facility] visits prior to 06/27/2022.   PRAPARE - Administrator, Civil Service (Medical): No    Lack of Transportation (Non-Medical): No  Physical Activity: Unknown (07/10/2020)   [facility] visits prior to 06/27/2022., [facility] visits prior to 06/27/2022.   Exercise Vital Sign    Days of Exercise per Week: Not on file    Minutes of Exercise per Session: 0 min  Stress: No Stress Concern Present (07/10/2020)   [facility] visits prior to 06/27/2022., [facility] visits prior to 06/27/2022.   [employer] of Occupational Health - Occupational Stress Questionnaire    Feeling of Stress : Not at all  Social Connections: Unknown (09/07/2021)   Received from [facility], [facility]   Social Network    Social Network: Not on file    Allergies:  Allergies   Allergen Reactions   Ingrezza [Valbenazine Tosylate] Other (See Comments)    Severe tongue chewing at 80 mg Tolerates lower doses   Naloxone Palpitations    Pt report MAT Rx is Subutex/Buprenorphine only   Amphetamine-Dextroamphetamine Other (See Comments)    unknown    Metabolic Disorder Labs: Lab Results  Component Value Date   HGBA1C 5.0 10/03/2021   MPG 96.8 10/03/2021   No results found for: "PROLACTIN" Lab Results  Component Value Date   CHOL 191 10/03/2021   TRIG 92 10/03/2021   HDL 48 10/03/2021   CHOLHDL 4.0 10/03/2021   VLDL 18 10/03/2021   LDLCALC 125 (H) 10/03/2021   Lab Results  Component Value Date   TSH 3.257 06/22/2022   TSH 1.500 02/04/2022    Therapeutic Level Labs: No results found for: "LITHIUM" No results found for: "VALPROATE" No results found for: "CBMZ"  Current Medications: Current Outpatient Medications  Medication Sig Dispense Refill   benztropine (COGENTIN) 2 MG tablet Take 1 tablet (2 mg total) by mouth 2 (two) times daily. 60 tablet 2   buprenorphine (SUBUTEX) 8 MG SUBL SL tablet Place 8 mg under the tongue 3 (three) times daily.     diphenhydrAMINE (BENADRYL) 50 MG tablet Take 2-4 tablets q6h as needed for restlessness related to Haldol (Patient taking differently: Take 25 mg by mouth every 8 (eight) hours as needed (For restlessness per patient).) 100 tablet 2   doxycycline (VIBRAMYCIN) 50 MG capsule Take 1 capsule (50 mg total) by mouth daily. 90 capsule 1   Fluocinolone Acetonide 0.01 % OIL      gabapentin (NEURONTIN) 400 MG capsule Take 1 capsule (400 mg total) by mouth 2 (two) times daily for 180 doses. 180 capsule 0   [START ON 03/05/2023] haloperidol (HALDOL) 5 MG tablet Take 1 tablet (5 mg total) by mouth at bedtime. 60 tablet 0   hydrOXYzine (ATARAX) 25 MG tablet Take 25 mg by mouth in the morning and at bedtime.     levothyroxine (SYNTHROID) 150 MCG tablet Take  1 tablet (150 mcg total) by mouth daily. 30 tablet 11    metroNIDAZOLE (METROGEL) 0.75 % gel      metroNIDAZOLE (METROGEL) 1 % gel Apply topically daily. 45 g 0   [START ON 03/05/2023] modafinil (PROVIGIL) 200 MG tablet Take 3 tablets (600 mg total) by mouth daily for 90 doses. 90 tablet 1   nicotine (NICODERM CQ - DOSED IN MG/24 HOURS) 21 mg/24hr patch Place 21 mg onto the skin daily.     tamsulosin (FLOMAX) 0.4 MG CAPS capsule Take by mouth.     No current facility-administered medications for this visit.  Musculoskeletal: Strength & Muscle Tone: Telepsych visit-Grossly normal Musculoskeletal and cranial nerve inspections Gait & Station: NA Patient leans: N/A  Psychiatric Specialty Exam: Review of Systems  Constitutional:  Negative for activity change, appetite change, chills, diaphoresis, fatigue, fever and unexpected weight change.  HENT:  Negative for congestion, postnasal drip, rhinorrhea, sinus pressure, sinus pain, sneezing, sore throat, tinnitus, trouble swallowing and voice change.   Eyes:  Negative for photophobia, pain, discharge, redness, itching and visual disturbance.  Endocrine: Negative for cold intolerance, heat intolerance, polydipsia, polyphagia and polyuria.       Continues to say he will get Thyroid FU  Genitourinary:  Negative for decreased urine volume, difficulty urinating, dysuria, enuresis, flank pain, frequency, genital sores, hematuria, penile discharge, penile pain, penile swelling, scrotal swelling, testicular pain and urgency.  Neurological:  Negative for dizziness, tremors, seizures, syncope, facial asymmetry, speech difficulty, weakness, light-headedness, numbness and headaches.  Psychiatric/Behavioral:  Negative for agitation, behavioral problems, confusion, decreased concentration, dysphoric mood, hallucinations, self-injury, sleep disturbance and suicidal ideas. The patient is not nervous/anxious and is not hyperactive.        Has researched online 12 Step groups but has not joined one    There were no vitals  taken for this visit.There is no height or weight on file to calculate BMI.MY CHART VISIT  General Appearance: Casual and Fairly Groomed  Eye Contact:  Good  Speech:  Clear and Coherent and Normal Rate  Volume:  Normal  Mood:  Euthymic  Affect:  Appropriate and Congruent  Thought Process:  Coherent, Goal Directed, and Descriptions of Associations: Intact  Orientation:  Full (Time, Place, and Person)  Thought Content: WDL and Logical   Suicidal Thoughts:  No  Homicidal Thoughts:  No  Memory:   No change-  Affected by SUDS/Psychotic episodes   Judgement:  Fair  Insight:  Fair  Psychomotor Activity:  Normal  Concentration:  Concentration: Good and Attention Span: Good  Recall:   See memory  Fund of Knowledge:  WDL  Language: Good  Akathisia:  Negative  Handed:  Right  AIMS (if indicated): Grossly normal  Assets:    ADL's:  Intact  Cognition: WDL  Sleep:   No complaint  Desire for Improvement Financial Resources/Insurance Housing Leisure Time Resilience Social Support Talents/Skills Vocational/Educational Screenings: AIMS    Flowsheet Row Video [facility] Office Visit from 11/27/2021 in [facility] Office Visit from 11/06/2021 in [facility] Office Visit from 10[facility]23/2021 in [facility] Office Visit from 10[facility]16/2021 [facility]  AIMS Total Score 0 4 7 5 21       Flowsheet Row ED to Hosp-Admission (Discharged) from 06/20/2022 in [facility]  SURGICAL ED from 06/13/2022 in [facility] Emergency Department at [facility] ED from 05/25/2022 in  [facility] Emergency Department at [facility]  C-SSRS RISK CATEGORY Low Risk No Risk No Risk        Assessment Stable     and Plan: Continue current treatmebnt/meds.  Continues to encourage self care /PCP FU and 12 STEP PARTICIPATION Fu 2 [facility]   Paul Morn, PA-C 02/25/2023, 3:59 PM

## 2023-04-23 ENCOUNTER — Telehealth (HOSPITAL_COMMUNITY): Payer: Medicare Other | Admitting: Medical

## 2023-04-29 ENCOUNTER — Telehealth (HOSPITAL_BASED_OUTPATIENT_CLINIC_OR_DEPARTMENT_OTHER): Payer: Medicare Other | Admitting: Medical

## 2023-04-29 ENCOUNTER — Encounter (HOSPITAL_COMMUNITY): Payer: Self-pay | Admitting: Medical

## 2023-04-29 DIAGNOSIS — G4730 Sleep apnea, unspecified: Secondary | ICD-10-CM

## 2023-04-29 DIAGNOSIS — I10 Essential (primary) hypertension: Secondary | ICD-10-CM

## 2023-04-29 DIAGNOSIS — I639 Cerebral infarction, unspecified: Secondary | ICD-10-CM | POA: Diagnosis not present

## 2023-04-29 DIAGNOSIS — G250 Essential tremor: Secondary | ICD-10-CM

## 2023-04-29 DIAGNOSIS — F1910 Other psychoactive substance abuse, uncomplicated: Secondary | ICD-10-CM

## 2023-04-29 DIAGNOSIS — F060 Psychotic disorder with hallucinations due to known physiological condition: Secondary | ICD-10-CM | POA: Diagnosis not present

## 2023-04-29 DIAGNOSIS — Z8659 Personal history of other mental and behavioral disorders: Secondary | ICD-10-CM

## 2023-04-29 DIAGNOSIS — Z720 Tobacco use: Secondary | ICD-10-CM

## 2023-04-29 DIAGNOSIS — F1921 Other psychoactive substance dependence, in remission: Secondary | ICD-10-CM

## 2023-04-29 DIAGNOSIS — Z79899 Other long term (current) drug therapy: Secondary | ICD-10-CM

## 2023-04-29 DIAGNOSIS — Z923 Personal history of irradiation: Secondary | ICD-10-CM

## 2023-04-29 DIAGNOSIS — E039 Hypothyroidism, unspecified: Secondary | ICD-10-CM

## 2023-04-29 DIAGNOSIS — D320 Benign neoplasm of cerebral meninges: Secondary | ICD-10-CM

## 2023-04-29 DIAGNOSIS — I6381 Other cerebral infarction due to occlusion or stenosis of small artery: Secondary | ICD-10-CM | POA: Diagnosis not present

## 2023-04-29 DIAGNOSIS — L719 Rosacea, unspecified: Secondary | ICD-10-CM

## 2023-04-29 DIAGNOSIS — G4710 Hypersomnia, unspecified: Secondary | ICD-10-CM

## 2023-04-29 DIAGNOSIS — F0634 Mood disorder due to known physiological condition with mixed features: Secondary | ICD-10-CM

## 2023-04-29 DIAGNOSIS — N139 Obstructive and reflux uropathy, unspecified: Secondary | ICD-10-CM

## 2023-04-29 MED ORDER — HALOPERIDOL 5 MG PO TABS: 5.0000 mg | ORAL_TABLET | Freq: Every day | ORAL | 0 refills | Status: DC

## 2023-04-29 MED ORDER — LEVOTHYROXINE SODIUM 150 MCG PO TABS: 150.0000 ug | ORAL_TABLET | Freq: Every day | ORAL | 11 refills | Status: DC

## 2023-04-29 MED ORDER — METRONIDAZOLE 1 % EX GEL: Freq: Every day | CUTANEOUS | 0 refills | Status: AC

## 2023-04-29 MED ORDER — MODAFINIL 200 MG PO TABS: 600.0000 mg | ORAL_TABLET | Freq: Every day | ORAL | 1 refills | Status: DC

## 2023-04-29 MED ORDER — BENZTROPINE MESYLATE 2 MG PO TABS: 2.0000 mg | ORAL_TABLET | Freq: Two times a day (BID) | ORAL | 2 refills | Status: DC

## 2023-04-29 MED ORDER — GABAPENTIN 400 MG PO CAPS: 400.0000 mg | ORAL_CAPSULE | Freq: Two times a day (BID) | ORAL | 0 refills | Status: DC

## 2023-04-29 NOTE — Progress Notes (Signed)
 BH MD/PA/NP OP Progress Note  04/29/2023 3:16 PM Paul Bray  MRN:  [id number] Virtual Visit via Video Note  I connected with Paul Bray on 04/29/23 at  3:00 PM EST by a video enabled telemedicine application and verified that I am speaking with the correct person using two identifiers.  Location: Patient: Home Provider: [facility] Office)   I discussed the limitations of evaluation and management by telemedicine and the availability of in person appointments. The patient expressed understanding and agreed to proceed.   History of Present Illness:See EPIC note    Observations/Objective:See EPIC note   Assessment and Plan:See EPIC note   Follow Up Instructions:See EPIC note    I discussed the assessment and treatment plan with the patient. The patient was provided an opportunity to ask questions and all were answered. The patient agreed with the plan and demonstrated an understanding of the instructions.   The patient was advised to call back or seek an in-person evaluation if the symptoms worsen or if the condition fails to improve as anticipated.  I provided 20 minutes of non-face-to-face time during this encounter.   Carlin Emmer, PA-C   Chief Complaint:  Chief Complaint  Patient presents with   Follow-up   Addiction Problem   Lacunar infarcts   Post stroke hallucinosis/psychosis   Benign Prostatic Hypertrophy   HPI: Paul Bray returns for My Chart 2 month FU S/P psychotic relapse around January 29,2024 due to noncompliance with his medications that had brought stability after a prior psychotic episode in June of 2023 with 20 mg Haldol . He had failed every 2nd generation antipsychotic tried. He had been tapered to 1 mg of Haldol  prior to the psychotic relapse but his mother reported he was not takin it and he had broken into secured medications and begun abusing his Modanaifil.[name] He remains psychosis free since stabilization at [facility] and discharge  at 5mg  Haldol  daily He is no longer interested in a further taper. He reports a much happier home life with his mother. Reports he has been exercising more. Hehasnt done n much with his You Tube site as he has run out of ideas for content.  Today his only concern is some recurrent urinary outley symptoms but not a complete blockage. He is followed at [facility]. He has no complaints about medications and wishes to continue current regimin . Visit Diagnosis:    ICD-10-CM   1. Polysubstance dependence in early, early partial, sustained full, or sustained partial remission (HCC)  F19.21     2. Multiple lacunar infarcts (HCC)  I63.81     3. Infarction of left basal ganglia (HCC)  I63.9     4. 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 Drug abuse, IV (HCC)  F19.10     6. Meningioma, cerebral (HCC)  D32.0     7. Status post gamma knife treatment  Z92.3     8. Familial tremor  G25.0     9. Hypothyroidism, unspecified type  E03.9     10. History of ADHD  Z86.59     11. 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 Rosacea  L71.9     13. Essential hypertension  I10     14. Urinary (tract) obstruction  N13.9     15. Hypersomnia with sleep apnea  G47.10    G47.30  16. Vapes nicotine  containing substance  Z72.0     17. Medication management  Z79.899       Past Psychiatric History:  [facility] Date of recent admission: 10/29/21 Date of recent discharge: 11/01/21 [facility]  [facility] OP [place] 6/17/-03/04/2020  Pt with Opiate dependence MAT Suboxone  at [employer] [facility] Dr Gradie C/O of paranoia he is aware of but cant stop/completely control.Has rx for Risperdal from [employer]. Also hx of Gamma Knife tradiation fo benign brain tumor ?2018 Says has records Requesting Psychiatrist/medication mangement for paranoia hx [facility] OP [place]  08/21/2021 - NOW [facility] INPATIENT  Date of Admission:  10/02/2021 Date of Discharge: 10/13/2021  Date of Admission:06/13/2022 [facility] ED)  Date of Discharge:06/16/22  Date of Admission: 06/25/2022  Date of Discharge:06/29/2022   [name] 05/27/2022- 06/03/2022   [place] Alcohol intoxication 03/26/2017  Alcohol withdrawal delirium 03/26/2017  Chronic daily headache 02/25/2017  Alcohol use disorder, severe, dependence 02/22/2017  Heroin dependence MAT Methadone Metro/Subutex  TBR 01/2018  Anxiety disorder, unspecified 01/20/2017  Paranoia 03/2018    [facility] Derrell Elora KIDD, NP - 04/12/2017 12:38 PM EST Formatting of this note might be different from the original. [facility] - Substance Abuse PHP/IOP Intake Assessment  BH Formulate an individual and appropriate relapse prevention plan.   BH SU Problem: Formation of a relapse prevention plan    [facility] Uncomplicated opioid dependence (CMS/HCC) 12/24/2017  1. Discharge from hospital. 2. Follow up at [facility]   Past Medical History:  CARE EVERYWHERE reviwed-no new entries  Past Medical History:  Diagnosis Date   ADHD    Anxiety    Brain tumor (HCC)    balance and occ memory issues and headaches   Brain tumor (HCC)    sees [facility] q year   Depression    Headache    migraine and tension   Hypothyroidism    Soft tissue mass    left shoulder   Substance abuse (HCC)     Past Surgical History:  Procedure Laterality Date   colonscopy     gamma knife radiation 2018 for brain tumor'     hematoma removed from arm Left    MASS EXCISION Left 06/30/2019   Procedure: EXCISION OF SOFT TISSUE  MASS LEFT SHOULDER;  Surgeon: Eletha Boas, MD;  Location: [facility];  Service: General;  Laterality: Left;  LMA   Family Psychiatric History: Alcoholism and drug abuse on father's side    Family History:  No family history on file.    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 Data is from another encounter. Last Filed Value  Patient's Job has Been Impacted by Current Illness YesPatient's Job has Been Impacted by Current Illness. Yes. Data is from another encounter. Last Filed Value  What is the Longest Time Patient has Held a Job? 25 yearsWhat is the Longest Time Patient has Held a Job?. 25 years. Data is from another encounter. 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 Data is from another encounter. Last Filed Value     Tobacco Use   Smoking status: Former      Types: Cigarettes   Smokeless tobacco: Current   Tobacco comments:     Vapes nicotene products  Vaping Use   Vaping status: Every Day  Substance and Sexual Activity   Alcohol use: Not Currently      Comment: quit Oct 2019   Drug use: Not Currently      Types: Cocaine, IV, Heroin, MDMA (Ecstacy)      Comment: last reported use; cocaine and heroin in 2019; MDMA Feb 2023   Sexual activity: Not Currently  Other Topics    Not on file  Social History Narrative   Not on file    Social Determinants of Health        Financial Resource Strain: Low Risk  (07/10/2020)    [facility] from [facility] visits prior to 06/27/2022., [facility] visits prior to 06/27/2022.    Overall Financial Resource Strain (CARDIA)     Difficulty of Paying Living Expenses: Not hard at all  Food Insecurity: No Food Insecurity (07/10/2020)    Received from [facility] visits prior to 06/27/2022., [facility] visits prior to 06/27/2022.    Hunger Vital Sign     Worried About Running Out of Food in the Last Year: Never true     Ran Out of Food in the Last Year: Never true  Transportation Needs: No Transportation Needs (07/10/2020)    Received from [facility] visits prior to 06/27/2022., [facility] visits prior to 06/27/2022.    PRAPARE - Therapist, Art (Medical): No     Lack of Transportation (Non-Medical): No  Physical Activity: Unknown (07/10/2020)    [facility] visits prior to 06/27/2022., [facility] visits prior to 06/27/2022.    Exercise Vital Sign     Days of Exercise per Week: Not on file     Minutes of Exercise per Session: 0 min  Stress: No Stress Concern Present (07/10/2020)    [facility] visits prior to 06/27/2022., [facility] visits prior to 06/27/2022.    [employer] of Occupational Health - Occupational Stress Questionnaire     Feeling of Stress : Not at all  Social Connections: Unknown (09/07/2021)    Received from [facility], [facility]    Social Network     Social Network: Not on file      Allergies:  Allergies       Allergies  Allergen Reactions   Ingrezza  [Valbenazine  Tosylate] Other (See Comments)      Severe tongue chewing at 80 mg Tolerates lower doses   Naloxone  Palpitations      Pt report MAT Rx is Subutex /Buprenorphine  only   Amphetamine-Dextroamphetamine Other (See Comments)      unknown        Metabolic Disorder Labs: Recent Labs       Lab Results  Component Value Date    HGBA1C 5.0 10/03/2021    MPG 96.8 10/03/2021      Recent Labs  No results found for: PROLACTIN   Recent Labs       Lab Results  Component Value Date    CHOL 191 10/03/2021    TRIG  92 10/03/2021    HDL 48 10/03/2021    CHOLHDL 4.0 10/03/2021    VLDL 18 10/03/2021     LDLCALC 125 (H) 10/03/2021      Recent Labs       Lab Results  Component Value Date    TSH 3.257 06/22/2022    TSH 1.500 02/04/2022        Therapeutic Level Labs: Recent Labs  No results found for: LITHIUM   Recent Labs  No results found for: VALPROATE   Recent Labs  No results found for: CBMZ     Therapeutic Level Labs:NA  Current Medications: Current Outpatient Medications  Medication Sig Dispense Refill   benztropine  (COGENTIN ) 2 MG tablet Take 1 tablet (2 mg total) by mouth 2 (two) times daily. 60 tablet 2   buprenorphine  (SUBUTEX ) 8 MG SUBL SL tablet Place 8 mg under the tongue 3 (three) times daily.     diphenhydrAMINE  (BENADRYL ) 50 MG tablet Take 2-4 tablets q6h as needed for restlessness related to Haldol  (Patient taking differently: Take 25 mg by mouth every 8 (eight) hours as needed (For restlessness per patient).) 100 tablet 2   Fluocinolone Acetonide 0.01 % OIL      gabapentin  (NEURONTIN ) 400 MG capsule Take 1 capsule (400 mg total) by mouth 2 (two) times daily for 180 doses. 180 capsule 0   haloperidol  (HALDOL ) 5 MG tablet Take 1 tablet (5 mg total) by mouth at bedtime. 60 tablet 0   hydrOXYzine  (ATARAX ) 25 MG tablet Take 25 mg by mouth in the morning and at bedtime.     levothyroxine  (SYNTHROID ) 150 MCG tablet Take 1 tablet (150 mcg total) by mouth daily. 30 tablet 11   metroNIDAZOLE  (METROGEL ) 0.75 % gel      metroNIDAZOLE  (METROGEL ) 1 % gel Apply topically daily. 45 g 0   modafinil  (PROVIGIL ) 200 MG tablet Take 3 tablets (600 mg total) by mouth daily for 90 doses. 90 tablet 1   nicotine  (NICODERM CQ  - DOSED IN MG/24 HOURS) 21 mg/24hr patch Place 21 mg onto the skin daily.     tamsulosin  (FLOMAX ) 0.4 MG CAPS capsule Take by mouth.     No current facility-administered medications for this visit.    Musculoskeletal: Strength & Muscle Tone: Telepsych visit-Grossly normal Musculoskeletal and cranial nerve inspections Gait & Station: NA Patient leans:  N/A  Psychiatric Specialty Exam: Review of Systems  Constitutional:  Negative for activity change, appetite change, chills, diaphoresis, fatigue, fever and unexpected weight change.  HENT:  Negative for congestion, dental problem, postnasal drip, rhinorrhea, sinus pressure, sinus pain, sneezing, sore throat and tinnitus.   Respiratory:  Negative for apnea, cough, choking, chest tightness, shortness of breath, wheezing and stridor.        Vapes  Cardiovascular:  Negative for chest pain, palpitations and leg swelling.  Gastrointestinal:  Negative for abdominal distention, abdominal pain, anal bleeding, blood in stool, constipation, diarrhea, nausea, rectal pain and vomiting.  Endocrine: Negative for cold intolerance, heat intolerance, polydipsia, polyphagia and polyuria.  Genitourinary:  Positive for difficulty urinating (per HPI). Negative for decreased urine volume, dysuria, enuresis, flank pain, frequency, genital sores, hematuria, penile discharge, penile pain, penile swelling, scrotal swelling, testicular pain and urgency.  Musculoskeletal:  Negative for arthralgias, back pain, gait problem, joint swelling, myalgias, neck pain and neck stiffness.  Skin:  Positive for color change (rosacea) and rash. Negative for pallor and wound.  Allergic/Immunologic: Negative for immunocompromised state.  Neurological:  Negative for dizziness, tremors, seizures, syncope, facial  asymmetry, speech difficulty, weakness, light-headedness, numbness and headaches.  Hematological:  Negative for adenopathy. Does not bruise/bleed easily.  Psychiatric/Behavioral:  Negative for agitation, behavioral problems, confusion, decreased concentration, dysphoric mood, hallucinations, self-injury, sleep disturbance and suicidal ideas. The patient is not nervous/anxious and is not hyperactive.   All other systems reviewed and are negative.   There were no vitals taken for this visit.There is no height or weight on file to  calculate BMI.MY CHART VISIT  General Appearance: Casual and Rosacea apparent  Eye Contact:  Good  Speech:  Clear and Coherent and Normal Rate  Volume:  Normal  Mood:  Euthymic  Affect:  Appropriate and Congruent  Thought Process:  Coherent and Descriptions of Associations: Intact  Orientation:  Full (Time, Place, and Person)  Thought Content:  WDL    Suicidal Thoughts:  No  Homicidal Thoughts:  No  Memory:   Affected by SUDS/Psychotic episodes   Judgement:  Other:  Improved  Insight:  Fair  Psychomotor Activity:  NA  Concentration:  Concentration: Good and Attention Span: Good  Recall:   See memory  Fund of Knowledge:  WDL  Language: Good  Akathisia:  Negative  Handed:  Right  AIMS (if indicated): Grossly negative   Assets:  Communication Skills Desire for Improvement Financial Resources/Insurance Housing Resilience Transportation Vocational/Educational Others:  Social support (MOM)  ADL's:  Intact  Cognition: Impaired,  Mild and Moderate  Sleep:   No complaints   Screenings: AIMS    Flowsheet Row Video [facility] Visit from 11/27/2021 in [facility] Visit from 11/06/2021 in [facility] Visit from 10[facility]23/2021 in [facility] Office Visit from 10/16/2021 [facility]  AIMS Total Score 0 4 7 5 21       Flowsheet Row ED to Hosp-Admission (Discharged) from [facility]20/2022 in [facility]  SURGICAL ED from 06/13/2022 in [facility] Department [facility] ED from 05/25/2022 in [facility] Emergency Department at [facility]  C-SSRS RISK CATEGORY Low Risk No Risk No Risk        Assessment Stable      and Plan: Continue current meds. Fu with Urology PRN FU here 2 mos-sooner if  needed     Carlin Emmer, PA-C 04/29/2023, 3:16 PM

## 2023-07-01 ENCOUNTER — Telehealth (HOSPITAL_COMMUNITY): Admitting: Medical

## 2023-07-01 ENCOUNTER — Encounter (HOSPITAL_COMMUNITY): Payer: Self-pay | Admitting: Medical

## 2023-07-01 DIAGNOSIS — D320 Benign neoplasm of cerebral meninges: Secondary | ICD-10-CM

## 2023-07-01 DIAGNOSIS — F060 Psychotic disorder with hallucinations due to known physiological condition: Secondary | ICD-10-CM

## 2023-07-01 DIAGNOSIS — Z8659 Personal history of other mental and behavioral disorders: Secondary | ICD-10-CM

## 2023-07-01 DIAGNOSIS — I6381 Other cerebral infarction due to occlusion or stenosis of small artery: Secondary | ICD-10-CM | POA: Diagnosis not present

## 2023-07-01 DIAGNOSIS — Z79899 Other long term (current) drug therapy: Secondary | ICD-10-CM

## 2023-07-01 DIAGNOSIS — F0634 Mood disorder due to known physiological condition with mixed features: Secondary | ICD-10-CM

## 2023-07-01 DIAGNOSIS — F1921 Other psychoactive substance dependence, in remission: Secondary | ICD-10-CM | POA: Diagnosis not present

## 2023-07-01 DIAGNOSIS — F1910 Other psychoactive substance abuse, uncomplicated: Secondary | ICD-10-CM

## 2023-07-01 DIAGNOSIS — I639 Cerebral infarction, unspecified: Secondary | ICD-10-CM | POA: Diagnosis not present

## 2023-07-01 DIAGNOSIS — G250 Essential tremor: Secondary | ICD-10-CM

## 2023-07-01 DIAGNOSIS — Z923 Personal history of irradiation: Secondary | ICD-10-CM

## 2023-07-01 DIAGNOSIS — E039 Hypothyroidism, unspecified: Secondary | ICD-10-CM

## 2023-07-01 MED ORDER — HALOPERIDOL 5 MG PO TABS: 5.0000 mg | ORAL_TABLET | Freq: Every day | ORAL | 0 refills | Status: DC

## 2023-07-01 MED ORDER — DOXYCYCLINE HYCLATE 50 MG PO CAPS: 50.0000 mg | ORAL_CAPSULE | Freq: Every day | ORAL | 1 refills | Status: DC

## 2023-07-01 MED ORDER — BENZTROPINE MESYLATE 2 MG PO TABS: 2.0000 mg | ORAL_TABLET | Freq: Two times a day (BID) | ORAL | 0 refills | Status: DC

## 2023-07-01 MED ORDER — MODAFINIL 200 MG PO TABS: 600.0000 mg | ORAL_TABLET | Freq: Every day | ORAL | 2 refills | Status: DC

## 2023-07-01 NOTE — Progress Notes (Signed)
 BH MD/PA/NP OP Progress Note  07/01/2023 3:27 PM Paul Bray  MRN:  [id number] Virtual Visit via Video Note  I connected with Paul Bray on 07/01/23 at  3:00 PM EST by a video enabled telemedicine application and verified that I am speaking with the correct person using two identifiers.  Location: Patient: At home Provider: [facility] [place]   I discussed the limitations of evaluation and management by telemedicine and the availability of in person appointments. The patient expressed understanding and agreed to proceed.   History of Present Illness:See EPIC note    Observations/Objective:See EPIC note   Assessment and Plan:See EPIC note   Follow Up Instructions:See EPIC note    I discussed the assessment and treatment plan with the patient. The patient was provided an opportunity to ask questions and all were answered. The patient agreed with the plan and demonstrated an understanding of the instructions.   The patient was advised to call back or seek an in-person evaluation if the symptoms worsen or if the condition fails to improve as anticipated.  I provided 20 minutes of non-face-to-face time during this encounter.   [name], PA-C   Chief Complaint:  Chief Complaint  Patient presents with   Follow-up   Addiction Problem   Hallucinations   Medication Refill   HPI: Paul Bray returns for 2 month FU S/P psychotic relapse around January 29,2024 due to noncompliance with his medications that had brought stability after a prior psychotic episode in June of 2023 with 20 mg Haldol. He had failed every 2nd generation antipsychotic tried. He had been tapered to 1 mg of Haldol prior to the psychotic relapse but his mother reported he was not takin it and he had broken into secured medications and begun abusing his Modanaifil.Marland Kitchen He remains psychosis free since stabilization at [facility] and discharge at 5mg  Haldol daily He is no longer interested in a further taper. He reports a  much happier home life with his mother. Reports he has been exercising more. He hasnt done much with his You Tube site as he is having difficulty getting/downloading videos. He reports stable life/relationships at home with his Mom. He was not aware he is due for his annual checkup with Medicare.He is currently having no problems.including prostate.He does request some Doxycyline for his Rosacea until he can see his PCP.  Visit Diagnosis:    ICD-10-CM   1. Polysubstance dependence in early, early partial, sustained full, or sustained partial remission (HCC)  F19.21     2. Multiple lacunar infarcts (HCC)  I63.81     3. Infarction of left basal ganglia (HCC)  I63.9     4. 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 Drug abuse, IV (HCC)  F19.10     6. Meningioma, cerebral (HCC)  D32.0     7. Status post gamma knife treatment  Z92.3     8. Familial tremor  G25.0     9. Hypothyroidism, unspecified type  E03.9     10. History of ADHD  Z86.59     11. 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 Medication management  Z79.899       Past Psychiatric History: [facility] Date of recent admission: 10/29/21 Date of recent discharge: 11/01/21 [id number]  [facility] OP [place] 6/17/-03/04/2020  Pt with Opiate dependence MAT Suboxone at [facility]  Dr Rayna Sexton C/O of paranoia he is aware of but cant stop/completely control.Has rx for Risperdal from [employer]. Also hx of Gamma Knife tradiation fo benign brain tumor ?2018 Says has records Requesting Psychiatrist/medication mangement for paranoia hx [facility] OP [place] 08/21/2021 - NOW [facility] INPATIENT  Date of Admission:  10/02/2021 Date of Discharge: 10/13/2021  Date of Admission:06/13/2022 [facility] ED)  Date of Discharge:06/16/22  Date of Admission: 06/25/2022  Date of Discharge:06/29/2022   [name] 05/27/2022- 06/03/2022    [place] Alcohol intoxication 03/26/2017  Alcohol withdrawal delirium 03/26/2017  Chronic daily headache 02/25/2017  Alcohol use disorder, severe, dependence 02/22/2017  Heroin dependence MAT Methadone [place]/Subutex TBR 01/2018  Anxiety disorder, unspecified 01/20/2017  Paranoia 03/2018    [facility] Jacklynn Bue, NP - 04/12/2017 12:38 PM EST Formatting of this note might be different from the original. [facility] - Substance Abuse PHP/IOP Intake Assessment  BH Formulate an individual and appropriate relapse prevention plan.   BH SU Problem: Formation of a relapse prevention plan    [facility] Uncomplicated opioid dependence (CMS/HCC) 12/24/2017  1. Discharge from hospital. 2. Follow up at [facility]    Past Medical History:  CARE EVERYWHERE REVIWED -no new entries  Past Medical History:  Diagnosis Date   ADHD    Anxiety    Brain tumor (HCC)    balance and occ memory issues and headaches   Brain tumor (HCC)    sees [facility] q year   Depression    Headache    migraine and tension   Hypothyroidism    Soft tissue mass    left shoulder   Substance abuse (HCC)     Past Surgical History:  Procedure Laterality Date   colonscopy     gamma knife radiation 2018 for brain tumor'     hematoma removed from arm Left    MASS EXCISION Left 06/30/2019   Procedure: EXCISION OF SOFT TISSUE  MASS LEFT SHOULDER;  Surgeon: Darnell Level, MD;  Location: [facility];  Service: General;  Laterality: Left;  LMA  Family Psychiatric History: Alcoholism and drug abuse on father's side    Family History:  No family history on file.    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 Data is from another encounter. Last Filed Value  Patient's Job has Been Impacted by Current Illness YesPatient's Job has Been Impacted by Current Illness. Yes. Data is from another encounter. Last Filed Value  What is the Longest Time Patient has Held a Job? 25 yearsWhat is the Longest Time Patient has Held a Job?. 25 years. Data is from another encounter. 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 Data is from another encounter. Last Filed Value     Tobacco Use   Smoking status: Former      Types: Cigarettes   Smokeless tobacco: Current   Tobacco comments:      Vapes nicotene products  Vaping Use  Vaping status: Every Day  Substance and Sexual Activity   Alcohol use: Not Currently      Comment: quit Oct 2019   Drug use: Not Currently      Types: Cocaine, IV, Heroin, MDMA (Ecstacy)      Comment: last reported use; cocaine and heroin in 2019; MDMA Feb 2023   Sexual activity: Not Currently  Other Topics     Not on file  Social History Narrative   Not on file    Social Determinants of Health           Financial Resource Strain: Low Risk  (07/10/2020)    [facility] from [facility] visits prior to 06/27/2022., [facility] visits prior to 06/27/2022.    Overall Financial Resource Strain (CARDIA)     Difficulty of Paying Living Expenses: Not hard at all  Food Insecurity: No Food Insecurity (07/10/2020)    Received from [facility] visits prior to 06/27/2022., [facility] visits prior to 06/27/2022.    Hunger Vital Sign     Worried About Running Out of Food in the Last Year: Never true     Ran Out of Food in the Last Year: Never true  Transportation Needs: No Transportation Needs  (07/10/2020)    Received from [facility] visits prior to 06/27/2022., [facility] visits prior to 06/27/2022.    PRAPARE - Therapist, art (Medical): No     Lack of Transportation (Non-Medical): No  Physical Activity: Unknown (07/10/2020)    [facility] visits prior to 06/27/2022., [facility] visits prior to 06/27/2022.    Exercise Vital Sign     Days of Exercise per Week: Not on file     Minutes of Exercise per Session: 0 min  Stress: No Stress Concern Present (07/10/2020)    [facility] visits prior to 06/27/2022., [facility] visits prior to 06/27/2022.    [employer] of Occupational Health - Occupational Stress Questionnaire     Feeling of Stress : Not at all  Social Connections: Unknown (09/07/2021)    Received from [facility], [facility]    Social Network     Social Network: Not on file      Allergies:  Allergies           Allergies  Allergen Reactions   Ingrezza [Valbenazine Tosylate] Other (See Comments)      Severe tongue chewing at 80 mg Tolerates lower doses   Naloxone Palpitations      Pt report MAT Rx is Subutex/Buprenorphine only   Amphetamine-Dextroamphetamine Other (See Comments)      unknown        Metabolic Disorder Labs: Recent Labs           Lab Results  Component Value Date    HGBA1C 5.0 10/03/2021    MPG 96.8 10/03/2021      Recent Labs  No results found for: "PROLACTIN"    Recent Labs           Lab Results  Component Value Date    CHOL 191 10/03/2021    TRIG 92 10/03/2021    HDL 48 10/03/2021    CHOLHDL 4.0 10/03/2021    VLDL 18 10/03/2021    LDLCALC 125 (H) 10/03/2021      Recent Labs  Lab Results  Component Value Date    TSH 3.257 06/22/2022    TSH 1.500 02/04/2022        Therapeutic Level Labs:NA  Current Medications: Current  Outpatient Medications  Medication Sig Dispense Refill   benztropine (COGENTIN) 2 MG tablet Take 1 tablet (2 mg total) by mouth 2 (two) times daily. 180 tablet 0   buprenorphine (SUBUTEX) 8 MG SUBL SL tablet Place 8 mg under the tongue 3 (three) times daily.     diphenhydrAMINE (BENADRYL) 50 MG tablet Take 2-4 tablets q6h as needed for restlessness related to Haldol (Patient taking differently: Take 25 mg by mouth every 8 (eight) hours as needed (For restlessness per patient).) 100 tablet 2   doxycycline (VIBRAMYCIN) 50 MG capsule Take 1 capsule (50 mg total) by mouth daily. 90 capsule 1   finasteride (PROSCAR) 5 MG tablet Take 5 mg by mouth daily.     Fluocinolone Acetonide 0.01 % OIL      gabapentin (NEURONTIN) 400 MG capsule Take 1 capsule (400 mg total) by mouth 2 (two) times daily for 180 doses. 180 capsule 0   haloperidol (HALDOL) 5 MG tablet Take 1 tablet (5 mg total) by mouth at bedtime. 120 tablet 0   hydrOXYzine (ATARAX) 25 MG tablet Take 25 mg by mouth in the morning and at bedtime.     levothyroxine (SYNTHROID) 150 MCG tablet Take 1 tablet (150 mcg total) by mouth daily. 30 tablet 11   metroNIDAZOLE (METROGEL) 1 % gel Apply topically daily. 45 g 0   modafinil (PROVIGIL) 200 MG tablet Take 3 tablets (600 mg total) by mouth daily. 90 tablet 2   nicotine (NICODERM CQ - DOSED IN MG/24 HOURS) 21 mg/24hr patch Place 21 mg onto the skin daily.     tamsulosin (FLOMAX) 0.4 MG CAPS capsule Take by mouth.     No current facility-administered medications for this visit.     Musculoskeletal: Strength & Muscle Tone: Telepsych visit-Grossly normal Musculoskeletal and cranial nerve inspections Gait & Station: NA Patient leans: N/A  Psychiatric Specialty Exam: Review of Systems  Constitutional:  Negative for activity change, appetite change, chills, diaphoresis, fatigue, fever and unexpected weight change.  HENT:  Negative for congestion, dental problem, postnasal drip, rhinorrhea, sinus  pressure, sinus pain, sneezing, sore throat, tinnitus, trouble swallowing and voice change.   Respiratory:  Negative for apnea, cough, choking, chest tightness, shortness of breath, wheezing and stridor.   Cardiovascular:  Negative for chest pain, palpitations and leg swelling.  Gastrointestinal:  Negative for abdominal distention, abdominal pain, anal bleeding, blood in stool, constipation, diarrhea, nausea, rectal pain and vomiting.  Endocrine: Negative for cold intolerance, heat intolerance, polydipsia, polyphagia and polyuria.  Genitourinary:  Negative for decreased urine volume, difficulty urinating, dysuria, enuresis, flank pain, frequency, genital sores, hematuria, penile discharge, penile pain, penile swelling, scrotal swelling, testicular pain and urgency.  Musculoskeletal:  Negative for arthralgias, back pain, gait problem, joint swelling, myalgias, neck pain and neck stiffness.  Skin:  Positive for color change and rash. Negative for pallor and wound.       Rosacea  Neurological:  Negative for dizziness, tremors, seizures, syncope, facial asymmetry, speech difficulty, weakness, light-headedness, numbness and headaches.  Hematological:  Negative for adenopathy. Does not bruise/bleed easily.  Psychiatric/Behavioral:  Negative for agitation, behavioral problems, confusion, decreased concentration, dysphoric mood, hallucinations, self-injury, sleep disturbance and suicidal ideas. The patient is not nervous/anxious and is not hyperactive.     There were no vitals taken for this visit.There is  no height or weight on file to calculate BMI.MY CHART VISIT  General Appearance: Casual  Eye Contact:  Good  Speech:  Clear and Coherent and Normal Rate  Volume:  Normal  Mood:  Euthymic  Affect:  Appropriate and Congruent  Thought Process:  Coherent, Goal Directed, and Descriptions of Associations: Intact  Orientation:  Full (Time, Place, and Person)  Thought Content: WDL and Logical   Suicidal  Thoughts:  No  Homicidal Thoughts:  No  Memory:  Affected by SUDS/Psychotic episodes   Judgement:  Other:  improved  Insight:  Fair  Psychomotor Activity:  Negative  Concentration:  Concentration: Good and Attention Span: Good  Recall:   see memory  Fund of Knowledge:  WDL  Language: Good  Akathisia:  Negative  Handed:  Right  AIMS (if indicated): Grossly negative  Assets:  Desire for Improvement Financial Resources/Insurance Housing Resilience Social Support Talents/Skills Vocational/Educational  ADL's:  Intact  Cognition: WNL  Sleep:   No complaints   Screenings: AIMS    Flowsheet Row Video [facility] Office Visit from 11/27/2021 in [facility] Office Visit from 11/06/2021 in [facility] Office Visit from 10[facility]23/2021 in [facility] Office Visit from 10/16/2021 in [facility]  AIMS Total Score 0 4 7 5 21       Flowsheet Row ED to Hosp-Admission (Discharged) from [facility]/20/2022 in [facility]  SURGICAL ED from 06/13/2022 in [facility] Emergency Department at [facility] ED from [facility]/25/2022 in [facility] Department at [facility]  C-SSRS RISK CATEGORY Low Risk No Risk No Risk        Assessment  Remains hallucination free Needs annual check up   and Plan: FU 3 months sooner if needed Meds refilled as needed Contact PCP to schedule Annual Exam      [name], PA-C 07/01/2023, 3:27 PM

## 2023-09-30 ENCOUNTER — Encounter (HOSPITAL_COMMUNITY): Payer: Self-pay | Admitting: Medical

## 2023-09-30 ENCOUNTER — Telehealth (HOSPITAL_COMMUNITY): Admitting: Medical

## 2023-09-30 DIAGNOSIS — F0634 Mood disorder due to known physiological condition with mixed features: Secondary | ICD-10-CM | POA: Diagnosis not present

## 2023-09-30 DIAGNOSIS — F1921 Other psychoactive substance dependence, in remission: Secondary | ICD-10-CM

## 2023-09-30 DIAGNOSIS — I6381 Other cerebral infarction due to occlusion or stenosis of small artery: Secondary | ICD-10-CM

## 2023-09-30 DIAGNOSIS — F1910 Other psychoactive substance abuse, uncomplicated: Secondary | ICD-10-CM

## 2023-09-30 DIAGNOSIS — F060 Psychotic disorder with hallucinations due to known physiological condition: Secondary | ICD-10-CM | POA: Diagnosis not present

## 2023-09-30 DIAGNOSIS — I639 Cerebral infarction, unspecified: Secondary | ICD-10-CM | POA: Diagnosis not present

## 2023-09-30 DIAGNOSIS — E039 Hypothyroidism, unspecified: Secondary | ICD-10-CM

## 2023-09-30 DIAGNOSIS — Z8659 Personal history of other mental and behavioral disorders: Secondary | ICD-10-CM

## 2023-09-30 DIAGNOSIS — G250 Essential tremor: Secondary | ICD-10-CM

## 2023-09-30 DIAGNOSIS — Z923 Personal history of irradiation: Secondary | ICD-10-CM

## 2023-09-30 DIAGNOSIS — D320 Benign neoplasm of cerebral meninges: Secondary | ICD-10-CM

## 2023-09-30 MED ORDER — MODAFINIL 200 MG PO TABS: 600.0000 mg | ORAL_TABLET | Freq: Every day | ORAL | 2 refills | Status: DC

## 2023-09-30 MED ORDER — GABAPENTIN 400 MG PO CAPS: 400.0000 mg | ORAL_CAPSULE | Freq: Every day | ORAL | 1 refills | Status: DC

## 2023-09-30 MED ORDER — BENZTROPINE MESYLATE 2 MG PO TABS: 2.0000 mg | ORAL_TABLET | Freq: Two times a day (BID) | ORAL | 0 refills | Status: DC

## 2023-09-30 MED ORDER — DOXYCYCLINE HYCLATE 50 MG PO CAPS: 50.0000 mg | ORAL_CAPSULE | Freq: Every day | ORAL | 1 refills | Status: DC

## 2023-09-30 MED ORDER — HALOPERIDOL 5 MG PO TABS: 5.0000 mg | ORAL_TABLET | Freq: Every day | ORAL | 1 refills | Status: DC

## 2023-09-30 NOTE — Progress Notes (Signed)
 Patient ID: Paul Bray, male   DOB: September 29, 1965, 58 y.o.   MRN: [id number] My Chart FU Pt failed to show up even after reminder

## 2023-09-30 NOTE — Addendum Note (Signed)
 Addended by: Suellyn Emory on: 09/30/2023 04:23 PM   Modules accepted: Orders, Level of Service

## 2023-09-30 NOTE — Progress Notes (Signed)
 BH MD/PA/NP OP Progress Note  09/30/2023 4:00 PM Paul Bray  MRN:  [id number] Virtual Visit via Video Note PT was no show fpr 2pm Arrived late /seen 4pm  I connected with Paul Bray on 09/30/23 at  4:00 PM EDT by a video enabled telemedicine application and verified that I am speaking with the correct person using two identifiers.  Location: Patient: At home Provider: [facility] [place]   I discussed the limitations of evaluation and management by telemedicine and the availability of in person appointments. The patient expressed understanding and agreed to proceed.   History of Present Illness:See EPIC note    Observations/Objective:See EPIC note   Assessment and Plan:See EPIC note   Follow Up Instructions:See EPIC note    I discussed the assessment and treatment plan with the patient. The patient was provided an opportunity to ask questions and all were answered. The patient agreed with the plan and demonstrated an understanding of the instructions.   The patient was advised to call back or seek an in-person evaluation if the symptoms worsen or if the condition fails to improve as anticipated.  I provided 20 minutes of non-face-to-face time during this encounter.   [name], PA-C   Chief Complaint:  Chief Complaint  Patient presents with   Follow-up   Medication Refill   Addiction Problem   Medication Problem   Gammarr radiation   HPI: Paul Bray returns for 3 month FU S/P psychotic relapse around January 29,2024 due to noncompliance with his medications that had brought stability after a prior psychotic episode in June of 2023 with 20 mg Haldol . He had failed every 2nd generation antipsychotic tried. He had been tapered to 1 mg of Haldol  prior to the psychotic relapse but his mother reported he was not takin it and he had broken into secured medications and begun abusing his Modanaifil.Paul Bray He continues tom report being psychosis free since stabilization at [facility]  and discharge at 5mg  Haldol  daily He remains uninterested in a further taper. He continues to report a goodhome life and relationship with his mother. Reports he has been exercising more. Still hasnt done much with his You Tube site as he is having difficulty getting/downloading videos. Says the dog is great. He requests refills per Medications.He is ready to do his Thyroid  studies.He is having no problems with his medications a wants to continue on current regamin. Visit Diagnosis:    ICD-10-CM   1. Polysubstance dependence in early, early partial, sustained full, or sustained partial remission (HCC)  F19.21     2. Multiple lacunar infarcts (HCC)  I63.81     3. Infarction of left basal ganglia (HCC)  I63.9     4. 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 Drug abuse, IV (HCC)  F19.10     6. Meningioma, cerebral (HCC)  D32.0     7. Status post gamma knife treatment  Z92.3     8. Familial tremor  G25.0     9. Hypothyroidism, unspecified type  E03.9     10. History of ADHD  Z86.59     11. Bipolar and related disorder due to another medical condition with mixed features  F06.34            Past Psychiatric History: [facility] Date of recent admission: 10/29/21 Date of recent discharge: 11/01/21 [facility]  [facility] [place] 6/17/-03/04/2020  Pt with Opiate dependence MAT Suboxone   at [employer] Dr Kateri Pal C/O of paranoia he is aware of but cant stop/completely control.Has rx for Risperdal from [employer]. Also hx of Gamma Knife tradiation fo benign brain tumor ?2018 Says has records Requesting Psychiatrist/medication mangement for paranoia hx [facility] OP [place] 08/21/2021 - NOW [facility] INPATIENT  Date of Admission:  10/02/2021 Date of Discharge: 10/13/2021  Date of Admission:06/13/2022 [facility] ED)  Date of Discharge:06/16/22  Date of Admission: 06/25/2022  Date of Discharge:06/29/2022   [name] 05/27/2022-  06/03/2022   [place] Alcohol intoxication 03/26/2017  Alcohol withdrawal delirium 03/26/2017  Chronic daily headache 02/25/2017  Alcohol use disorder, severe, dependence 02/22/2017  Heroin dependence MAT Methadone Metro/Subutex  TBR 01/2018  Anxiety disorder, unspecified 01/20/2017  Paranoia 03/2018    [facility] [name], NP - 04/12/2017 12:38 PM EST Formatting of this note might be different from the original. [facility] - Substance Abuse PHP/IOP Intake Assessment  BH Formulate an individual and appropriate relapse prevention plan.   BH SU Problem: Formation of a relapse prevention plan    [name] (CMS/HCC) 12/24/2017  1. Discharge from hospital. 2. Follow up at [facility]      Past Medical History:  CARE EVERYWHERE REVIWED -no new entries       Past Medical History:  Diagnosis Date   ADHD     Anxiety     Brain tumor (HCC)      balance and occ memory issues and headaches   Brain tumor (HCC)      sees [facility] q year   Depression     Headache      migraine and tension   Hypothyroidism     Soft tissue mass      left shoulder   Substance abuse (HCC)               Past Surgical History:  Procedure Laterality Date   colonscopy       gamma knife radiation 2018 for brain tumor'       hematoma removed from arm Left     MASS EXCISION Left 06/30/2019    Procedure: EXCISION OF SOFT TISSUE  MASS LEFT SHOULDER;  Surgeon: [name], MD;  Location: [facility];  Service: General;  Laterality: Left;  LMA      Family Psychiatric History: Alcoholism and drug abuse on father's side    Family History:  No family history on file.    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 Data is from another encounter. Last Filed Value  Patient's Job has Been Impacted by Current Illness YesPatient's Job has Been Impacted by Current Illness. Yes. Data is from another encounter. Last Filed Value  What is the Longest Time Patient has Held a Job? 25 yearsWhat is the Longest Time Patient has Held a Job?. 25 years. Data is from another encounter. 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 Data is from another encounter.  Last Filed Value     Tobacco Use   Smoking status: Former      Types: Cigarettes   Smokeless tobacco: Current   Tobacco comments:      Vapes nicotene products  Vaping Use   Vaping status: Every Day  Substance and Sexual Activity   Alcohol use: Not Currently      Comment: quit Oct 2019   Drug use: Not Currently      Types: Cocaine, IV, Heroin, MDMA (Ecstacy)      Comment: last reported use; cocaine and heroin in 2019; MDMA Feb 2023   Sexual activity: Not Currently  Other Topics     Not on file  Social History Narrative   Not on file    Social Determinants of Health           Financial Resource Strain: Low Risk  (07/10/2020)    Received from [facility] visits prior to 06/27/2022., [facility] visits prior to 06/27/2022.    Overall Financial Resource Strain (CARDIA)     Difficulty of Paying Living Expenses: Not hard at all  Food Insecurity: No Food Insecurity (07/10/2020)    Received from [facility] visits prior to 06/27/2022., [facility] visits prior to 06/27/2022.    Hunger Vital Sign     Worried About Running Out of Food in the Last Year: Never true     Ran Out of Food in the Last Year: Never true   Transportation Needs: No Transportation Needs (07/10/2020)    Received from [facility] visits prior to 06/27/2022., [facility] visits prior to 06/27/2022.    PRAPARE - Therapist, art (Medical): No     Lack of Transportation (Non-Medical): No  Physical Activity: Unknown (07/10/2020)    [facility] visits prior to 06/27/2022., [facility] visits prior to 06/27/2022.    Exercise Vital Sign     Days of Exercise per Week: Not on file     Minutes of Exercise per Session: 0 min  Stress: No Stress Concern Present (07/10/2020)    [facility] prior to 06/27/2022., [facility] visits prior to 06/27/2022.    [employer] of Occupational Health - Occupational Stress Questionnaire     Feeling of Stress : Not at all  Social Connections: Unknown (09/07/2021)    [facility]    Social Network     Social Network: Not on file        Allergies:  Allergies  Allergen Reactions   Ingrezza  [Valbenazine  Tosylate] Other (See Comments)    Severe tongue chewing at 80 mg Tolerates lower doses   Naloxone  Palpitations    Pt report MAT Rx is Subutex /Buprenorphine  only   Amphetamine-Dextroamphetamine Other (See Comments)    unknown    Metabolic Disorder Labs: Lab Results  Component Value Date   HGBA1C 5.0 10/03/2021   MPG 96.8 10/03/2021   No results found for: PROLACTIN Lab Results  Component Value Date   CHOL 191 10/03/2021   TRIG 92 10/03/2021   HDL 48 10/03/2021   CHOLHDL 4.0 10/03/2021   VLDL 18 10/03/2021   LDLCALC 125 (H) 10/03/2021   Lab Results  Component Value Date   TSH 3.257 06/22/2022   TSH 1.500 02/04/2022    Therapeutic Level Labs:NA  Current Medications: Current Outpatient Medications  Medication Sig Dispense Refill   benztropine  (COGENTIN ) 2 MG tablet  Take 1 tablet (2 mg total) by mouth 2 (two) times daily. 180 tablet 0   buprenorphine  (SUBUTEX ) 8 MG SUBL SL tablet Place 8 mg under the tongue 3 (three) times daily.     diphenhydrAMINE  (BENADRYL ) 50 MG tablet Take 2-4 tablets q6h as needed for restlessness related to Haldol  (Patient taking differently: Take 25 mg by mouth every 8 (eight) hours as needed (For restlessness per patient).) 100 tablet 2   doxycycline  (VIBRAMYCIN ) 50 MG capsule Take 1 capsule (50 mg total) by mouth daily. 90 capsule 1   finasteride  (PROSCAR ) 5 MG tablet Take 5 mg by mouth daily.     Fluocinolone Acetonide 0.01 % OIL      gabapentin  (NEURONTIN ) 400 MG capsule Take 1 capsule (400 mg total) by mouth 2 (two) times daily for 180 doses. 180 capsule 0   haloperidol  (HALDOL ) 5 MG tablet Take 1 tablet (5 mg total) by mouth at bedtime. 120 tablet 0   hydrOXYzine  (ATARAX ) 25 MG tablet Take 25 mg by mouth in the morning and at bedtime.     levothyroxine  (SYNTHROID ) 150 MCG tablet Take 1 tablet (150 mcg total) by mouth daily. 30 tablet 11   metroNIDAZOLE  (METROGEL ) 1 % gel Apply topically daily. 45 g 0   modafinil  (PROVIGIL ) 200 MG tablet Take 3 tablets (600 mg total) by mouth daily. 90 tablet 2   nicotine  (NICODERM CQ  - DOSED IN MG/24 HOURS) 21 mg/24hr patch Place 21 mg onto the skin daily.     tamsulosin  (FLOMAX ) 0.4 MG CAPS capsule Take by mouth.     No current facility-administered medications for this visit.     Musculoskeletal: Strength & Muscle Tone: Telepsych visit-Grossly normal Musculoskeletal and cranial nerve inspections Gait & Station: NA Patient leans: N/A  Psychiatric Specialty Exam: Review of Systems  Constitutional:  Negative for activity change, appetite change, chills, diaphoresis, fatigue, fever and unexpected weight change.  HENT:  Negative for congestion, dental problem, drooling, ear discharge, ear pain, facial swelling, hearing loss, nosebleeds, postnasal drip, rhinorrhea, sinus pressure, sinus  pain, sneezing, sore throat, tinnitus and trouble swallowing.   Respiratory:  Negative for apnea, cough, choking, chest tightness, shortness of breath, wheezing and stridor.   Cardiovascular:  Negative for chest pain, palpitations and leg swelling.  Endocrine: Negative for cold intolerance, heat intolerance, polydipsia, polyphagia and polyuria.  Genitourinary:  Negative for decreased urine volume, difficulty urinating, dysuria, enuresis, flank pain, frequency, genital sores, hematuria, penile discharge, penile pain, penile swelling, scrotal swelling, testicular pain and urgency.  Neurological:  Negative for dizziness, tremors, seizures, syncope, facial asymmetry, speech difficulty, weakness, light-headedness, numbness and headaches.  Psychiatric/Behavioral:  Negative for agitation, behavioral problems, confusion, decreased concentration, dysphoric mood, hallucinations, self-injury, sleep disturbance and suicidal ideas. The patient is not nervous/anxious and is not hyperactive.     There were no vitals taken for this visit.There is no height or weight on file to calculate BMI.MY CHART VISIT  General Appearance: Casual and Neat  Eye Contact:  Good  Speech:  Clear and Coherent  Volume:  Normal  Mood:  Euthymic  Affect:  Appropriate and Congruent  Thought Process:  Coherent, Goal Directed, and Descriptions of Associations: Intact  Orientation:  Full (Time, Place, and Person)  Thought Content: WDL and Logical   Suicidal Thoughts:  No  Homicidal Thoughts:  No  Memory:  Affected by SUDS/Psychotic episodes ? radiation  Judgement: Appears  to be Intact  Insight:  Present  Psychomotor Activity:  Normal for video  Concentration:  Concentration: Good and Attention Span: Good  Recall:  see memory   Fund of Knowledge: WDL  Language: Good  Akathisia:  Negative  Handed:  Right  AIMS (if indicated): not done  Assets:  Desire for Improvement Financial Resources/Insurance Housing Resilience Social  Support Talents/Skills Vocational/Educational  ADL's:  Intact  Cognition: WNL for visit  Sleep:    No complaints RX MODAFiNIL    Screenings: AIMS    Flowsheet Row Video [facility] Visit from 11/27/2021 in [facility] Visit from 11/06/2021 in [facility] Office Visit from 10[facility]23/2021 in [facility] Office Visit from 10/16/2021 [facility]  AIMS Total Score 0 4 7 5 21       Flowsheet Row ED to Hosp-Admission (Discharged) from [facility]2022 [facility]URG[facility] ED from 06/13/2022 in [facility] Emergency Department at [facility] ED from [facility]/25/2022 in [facility] Department at [facility]  C-SSRS RISK CATEGORY Low Risk No Risk No Risk        Assessment The best I have seen him since we started.Stable well groomed (a first)Free of hallucinations,happy      and Plan:Refills per Medications chart  AddendumBH MD/PA/NP OP Progress Note   04/30/2022 5:12 PM WINSTON MISNER  MRN:  [id number] Virtual Visit via Video Note   I connected with Paul Bray on 04/30/22 at  3:30 PM EST by a video enabled telemedicine application and verified that I am speaking with the correct person using two identifiers.   Location: Patient: Home Provider: [facility] [place]   I discussed the limitations of evaluation and management by telemedicine and the availability of in person appointments. The patient expressed understanding and agreed to proceed.     History of Present Illness:See EPIC note     Observations/Objective:See EPIC note     Assessment and Plan:See EPIC note     Follow Up Instructions:See EPIC note  I discussed the assessment and treatment plan with the patient. The patient was provided an opportunity  to ask questions and all were answered. The patient agreed with the plan and demonstrated an understanding of the instructions.   The patient was advised to call back or seek an in-person evaluation if the symptoms worsen or if the condition fails to improve as anticipated.   I provided 20 minutes of non-face-to-face time during this encounter.     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 He believed the tumor was the cause of this and subsequent development of auditory hallucinations which at times progressed to paranoid psychosis. During this time period he had developed an IV cocaine dependence and serial FU MRIs revealed a series of mini strokes  in the Lt basal ganglia area which coincide with his deteriration and can explain the development of his psychosis.Sent to live by himself in public housing without direct support and supervision he began to take medications by how he felt until, finally, he became suicidal after using Ectasy and overmedicating himself. His mother rescued him and brought him to [facility] from which he was admitted to [facility] and treated. He failed second generation antipsychotics so that he was evemtually put on high dose Haldol . His psychosis respondedbut he had significant EPS symptomsAttempts to contol them wuth new TDK meds resulted in severe side effects from those medications but fortunately were controlled with Benadryl  while  tapering his Haldol  to lowest effective dosage which is now at 1 mg without side effect.He was discharged and was moved in with his mother. 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 He continues to c/o severe fatigue now. Prior to this he c/o hypersomnia rather than fatigue.He is still needing to get another Thyroid  study but is within lo normal range by lab.     [name], PA-C 09/30/2023, 4:00 PM

## 2023-10-04 ENCOUNTER — Other Ambulatory Visit (HOSPITAL_COMMUNITY): Payer: Self-pay

## 2023-10-04 DIAGNOSIS — E039 Hypothyroidism, unspecified: Secondary | ICD-10-CM

## 2023-10-25 ENCOUNTER — Other Ambulatory Visit (HOSPITAL_COMMUNITY): Payer: Self-pay

## 2023-11-04 ENCOUNTER — Telehealth (HOSPITAL_COMMUNITY): Admitting: Medical

## 2024-01-06 ENCOUNTER — Encounter (HOSPITAL_COMMUNITY): Payer: Self-pay

## 2024-01-06 ENCOUNTER — Telehealth (HOSPITAL_COMMUNITY): Admitting: Medical

## 2024-01-13 ENCOUNTER — Encounter (HOSPITAL_COMMUNITY): Payer: Self-pay | Admitting: Medical

## 2024-01-13 ENCOUNTER — Telehealth (HOSPITAL_COMMUNITY): Admitting: Medical

## 2024-01-13 DIAGNOSIS — F1921 Other psychoactive substance dependence, in remission: Secondary | ICD-10-CM | POA: Diagnosis not present

## 2024-01-13 DIAGNOSIS — I639 Cerebral infarction, unspecified: Secondary | ICD-10-CM | POA: Diagnosis not present

## 2024-01-13 DIAGNOSIS — G250 Essential tremor: Secondary | ICD-10-CM

## 2024-01-13 DIAGNOSIS — F1910 Other psychoactive substance abuse, uncomplicated: Secondary | ICD-10-CM

## 2024-01-13 DIAGNOSIS — D320 Benign neoplasm of cerebral meninges: Secondary | ICD-10-CM

## 2024-01-13 DIAGNOSIS — E039 Hypothyroidism, unspecified: Secondary | ICD-10-CM

## 2024-01-13 DIAGNOSIS — I6381 Other cerebral infarction due to occlusion or stenosis of small artery: Secondary | ICD-10-CM | POA: Diagnosis not present

## 2024-01-13 DIAGNOSIS — Z8659 Personal history of other mental and behavioral disorders: Secondary | ICD-10-CM

## 2024-01-13 DIAGNOSIS — K0530 Chronic periodontitis, unspecified: Secondary | ICD-10-CM

## 2024-01-13 DIAGNOSIS — Z923 Personal history of irradiation: Secondary | ICD-10-CM

## 2024-01-13 DIAGNOSIS — F060 Psychotic disorder with hallucinations due to known physiological condition: Secondary | ICD-10-CM

## 2024-01-13 MED ORDER — LEVOTHYROXINE SODIUM 150 MCG PO TABS: 150.0000 ug | ORAL_TABLET | Freq: Every day | ORAL | 0 refills | Status: DC

## 2024-01-13 MED ORDER — BENZTROPINE MESYLATE 2 MG PO TABS: 2.0000 mg | ORAL_TABLET | Freq: Two times a day (BID) | ORAL | 0 refills | Status: DC

## 2024-01-13 MED ORDER — MODAFINIL 200 MG PO TABS: 600.0000 mg | ORAL_TABLET | Freq: Every day | ORAL | 2 refills | Status: DC

## 2024-01-13 NOTE — Progress Notes (Signed)
 BH MD/PA/NP OP Progress Note  01/13/2024 5:10 PM ARGEL PABLO  MRN:  [id number] Virtual Visit via Video Note  I connected with Evalene JINNY Cords on 01/13/24 at  3:00 PM EDT by a video enabled telemedicine application and verified that I am speaking with the correct person using two identifiers.  Location: Patient: At Home Provider: [facility] [place]   I discussed the limitations of evaluation and management by telemedicine and the availability of in person appointments. The patient expressed understanding and agreed to proceed.   History of Present Illness:See EPIC note    Observations/Objective:See EPIC note   Assessment and Plan:See EPIC note   Follow Up Instructions:See EPIC note    I discussed the assessment and treatment plan with the patient. The patient was provided an opportunity to ask questions and all were answered. The patient agreed with the plan and demonstrated an understanding of the instructions.   The patient was advised to call back or seek an in-person evaluation if the symptoms worsen or if the condition fails to improve as anticipated.  I provided 20 minutes of non-face-to-face time during this encounter.   Carlin Emmer, PA-C   Chief Complaint:  Chief Complaint  Patient presents with   Follow-up   Addiction Problem   Lacunar infarcts withn Psychosis   HPI: [name] returns for 3 month FU S/P psychotic relapse around January 29,2024 due to noncompliance with his medications that had brought stability after a prior psychotic episode in June of 2023 with 20 mg Haldol . He had failed every 2nd generation antipsychotic tried. He had been tapered to 1 mg of Haldol  prior to the psychotic relapse but his mother reported he was not takin it and he had broken into secured medications and begun abusing his Modanaifil.[name] He continues to report being psychosis free since stabilization at [facility] and discharge at 5mg  Haldol  daily He remains uninterested in a further taper.He  has no TD symptoms. He continues to report a goodhome life and relationship with his mother. Reports he has been exercising more. Still hasnt done much with his You Tube site as he is having difficulty getting/downloading videos.but he has started using AI to write music. Continues to walk the dog he describes as great. He requests refills per Medications.He did his Thyroid  studies which were WNL.He is having no problems with his medications a wants to continue with the current regamin. Recent Urology FU was negative.   Visit Diagnosis:    ICD-10-CM   1. Hypothyroidism, unspecified type  E03.9     2. Acquired hypothyroidism  E03.9     3. Polysubstance dependence in early, early partial, sustained full, or sustained partial remission (HCC)  F19.21     4. Multiple lacunar infarcts (HCC)  I63.81     5. Infarction of left basal ganglia (HCC)  I63.9     6. 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 Drug abuse, IV (HCC)  F19.10     8. Meningioma, cerebral (HCC)  D32.0     9. Status post gamma knife treatment  Z92.3     10. Familial tremor  G25.0     11. History of ADHD  Z86.59       Past Psychiatric History:  [facility] Date of recent admission: 10/29/21 Date of recent discharge: 11/01/21 [facility]  [facility] [place] 6/17/-03/04/2020  Pt with Opiate dependence MAT Suboxone  at [employer] Dr Gradie C/O  of paranoia he is aware of but cant stop/completely control.Has rx for Risperdal from [employer]. Also hx of Gamma Knife tradiation fo benign brain tumor ?2018 Says has records Requesting Psychiatrist/medication mangement for paranoia hx [facility] [place] 08/21/2021 - NOW [facility] INPATIENT  Date of Admission:  10/02/2021 Date of Discharge: 10/13/2021  Date of Admission:06/13/2022 [facility] ED)  Date of Discharge:06/16/22  Date of Admission: 06/25/2022  Date of Discharge:06/29/2022   [name] 05/27/2022-  06/03/2022   [place] Alcohol intoxication 03/26/2017  Alcohol withdrawal delirium 03/26/2017  Chronic daily headache 02/25/2017  Alcohol use disorder, severe, dependence 02/22/2017  Heroin dependence MAT Methadone Metro/Subutex  TBR 01/2018  Anxiety disorder, unspecified 01/20/2017  Paranoia 03/2018    [facility] Derrell Elora KIDD, NP - 04/12/2017 12:38 PM EST Formatting of this note might be different from the original. [facility] - Substance Abuse PHP/IOP Intake Assessment  BH Formulate an individual and appropriate relapse prevention plan.   BH SU Problem: Formation of a relapse prevention plan    [facility] Uncomplicated opioid dependence (CMS/HCC) 12/24/2017  1. Discharge from hospital. 2. Follow up at [facility]    Past Medical History:  Care Everywhere reviewed  11/22/2023 Telephone [facility]  [address]  [place], [place] [address]  Support, Population Health  PREVENTIVE CARE    11/10/2023 8:00 AM EDT Office Visit [facility]  [address]  [place], [place] [address]  [phone]  Freddi Cree, RDH  [address]  [address]  [place], [place] [address]  [phone] (Work)  Encounter for dental examination and cleaning with abnormal findings   Past Surgical History:  Procedure Laterality Date   colonscopy     gamma knife radiation 2018 for brain tumor'     hematoma removed from arm Left    MASS EXCISION Left 06/30/2019   Procedure: EXCISION OF SOFT TISSUE  MASS LEFT SHOULDER;  Surgeon: Eletha Boas, MD;  Location: [facility];  Service: General;  Laterality: Left;  LMA    Family Psychiatric History:  Alcoholism and drug abuse on father's side  Family History: No family history on file.  Social History:  Social History   Socioeconomic History   Marital status: Single    Spouse name: Not on file   Number of children: Not on file   Years of education: Not on file    Highest education level: Not on file  Occupational History   Not on file  Tobacco Use   Smoking status: Former    Types: Cigarettes   Smokeless tobacco: Current   Tobacco comments:    quit jan 2020  Vaping Use   Vaping status: Every Day  Substance and Sexual Activity   Alcohol use: Not Currently    Comment: quit Oct 2019   Drug use: Not Currently    Types: Cocaine, IV, Heroin, MDMA (Ecstacy)    Comment: last reported use; cocaine and heroin in 2019; MDMA Feb 2023   Sexual activity: Not Currently  Other Topics Concern   Not on file  Social History Narrative   Not on file   Social Drivers of Health   Financial Resource Strain: Low Risk  (07/10/2020)   [facility] from [facility] visits prior to 06/27/2022.   Overall Financial Resource Strain (CARDIA)    Difficulty of Paying Living Expenses: Not hard at all  Food Insecurity: No Food Insecurity (07/10/2020)   Received from [facility] visits prior to 06/27/2022.   Hunger Vital Sign    Within the past 12 months, you worried that your food would run out before you got the money to buy more.: Never true    Within the past 12 months, the food you bought just didn't last and you didn't have money to get more.: Never true  Transportation Needs: No Transportation Needs (07/10/2020)   Received from [facility] visits prior to 06/27/2022.   PRAPARE - Administrator, Civil Service (Medical): No    Lack of Transportation (Non-Medical): No  Physical Activity: Unknown (07/10/2020)   [facility] visits prior to 06/27/2022.   Exercise Vital Sign    Days of Exercise per Week: Not on file    On average, how many minutes do you engage in exercise at this level?: 0 min  Stress: No Stress Concern Present (07/10/2020)   [facility] from [facility] visits prior to 06/27/2022.   [employer] of Occupational Health -  Occupational Stress Questionnaire    Feeling of Stress : Not at all  Social Connections: Unknown (09/07/2021)   Received from [facility]   Social Network    Social Network: Not on file    Allergies:  Allergies  Allergen Reactions   Ingrezza  [Valbenazine  Tosylate] Other (See Comments)    Severe tongue chewing at 80 mg Tolerates lower doses   Naloxone  Palpitations    Pt report MAT Rx is Subutex /Buprenorphine  only   Amphetamine-Dextroamphetamine Other (See Comments)    unknown    Metabolic Disorder Labs: Lab Results  Component Value Date   HGBA1C 5.0 10/03/2021   MPG 96.8 10/03/2021   No results found for: PROLACTIN Lab Results  Component Value Date   CHOL 191 10/03/2021   TRIG 92 10/03/2021   HDL 48 10/03/2021   CHOLHDL 4.0 10/03/2021   VLDL 18 10/03/2021   LDLCALC 125 (H) 10/03/2021   Lab Results  Component Value Date   TSH 3.257 06/22/2022   TSH 1.500 02/04/2022    Therapeutic Level Labs: No results found for: LITHIUM No results found for: VALPROATE No results found for: CBMZ  Current Medications: Current Outpatient Medications  Medication Sig Dispense Refill   benztropine  (COGENTIN ) 2 MG tablet Take 1 tablet (2 mg total) by mouth 2 (two) times daily. 180 tablet 0   buprenorphine  (SUBUTEX ) 8 MG SUBL SL tablet Place 8 mg under the tongue 3 (three) times daily.     diphenhydrAMINE  (BENADRYL ) 50 MG tablet Take 2-4 tablets q6h as needed for restlessness related to Haldol  (Patient taking differently: Take 25 mg by mouth every 8 (eight) hours as needed (For restlessness per patient).) 100 tablet 2   doxycycline  (VIBRAMYCIN ) 50 MG capsule Take 1 capsule (50 mg total) by mouth daily. 90 capsule 1   finasteride  (PROSCAR ) 5 MG tablet Take 5 mg by mouth daily.     Fluocinolone Acetonide 0.01 % OIL      gabapentin  (NEURONTIN ) 400 MG capsule Take 1 capsule (400 mg total) by mouth daily for 180 doses. 90 capsule 1   haloperidol  (HALDOL ) 5 MG tablet Take 1 tablet  (5 mg total) by mouth at bedtime. 90 tablet 1   hydrocortisone 2.5 % lotion Apply 1 Application topically.     levothyroxine  (SYNTHROID ) 150 MCG tablet Take 1 tablet (150 mcg total) by mouth daily. 90 tablet 0   metroNIDAZOLE  (METROGEL ) 1 % gel Apply topically daily. 45  g 0   modafinil  (PROVIGIL ) 200 MG tablet Take 3 tablets (600 mg total) by mouth daily. 90 tablet 2   nicotine  (NICODERM CQ  - DOSED IN MG/24 HOURS) 21 mg/24hr patch Place 21 mg onto the skin daily.     tamsulosin  (FLOMAX ) 0.4 MG CAPS capsule Take by mouth.     No current facility-administered medications for this visit.     Musculoskeletal: Strength & Muscle Tone: Telepsych visit-Grossly normal Musculoskeletal and cranial nerve inspections Gait & Station: NA Patient leans: N/A  Psychiatric Specialty Exam: Review of Systems  Constitutional:  Negative for activity change, appetite change, chills, diaphoresis, fatigue, fever and unexpected weight change.  Endocrine: Negative for cold intolerance, heat intolerance, polydipsia, polyphagia and polyuria.  Genitourinary:  Negative for decreased urine volume, difficulty urinating, dysuria, enuresis, flank pain, frequency, genital sores, hematuria, penile discharge, penile pain, penile swelling, scrotal swelling, testicular pain and urgency.  Musculoskeletal:  Negative for arthralgias, back pain, gait problem, joint swelling, myalgias, neck pain and neck stiffness.  Neurological:  Negative for dizziness, tremors, seizures, syncope, facial asymmetry, speech difficulty, weakness, light-headedness, numbness and headaches.  Psychiatric/Behavioral:  Negative for agitation, behavioral problems, confusion, decreased concentration, dysphoric mood, hallucinations, self-injury, sleep disturbance and suicidal ideas. The patient is not nervous/anxious and is not hyperactive.   All other systems reviewed and are negative.   There were no vitals taken for this visit.There is no height or weight on  file to calculate BMI.My Chart Visit  General Appearance: Casual and Neat  Eye Contact:  Good  Speech:  Clear and Coherent and Normal Rate  Volume:  Normal  Mood:  Euthymic  Affect:  Congruent  Thought Process:  Coherent, Goal Directed, and Descriptions of Associations: Intact  Orientation:  Full (Time, Place, and Person)  Thought Content: WDL and Logical   Suicidal Thoughts:  No  Homicidal Thoughts:  No  Memory:  Affected by SUDS/Psychotic episodes   Judgement:  Affected by SUDS/Psychotic episodes   Insight:  LIMITED  Psychomotor Activity:  Negative  Concentration:  Concentration: Good and Attention Span: Good  Recall:  Fair  Fund of Knowledge: WDL  Language: Good  Akathisia:  Negative  Handed:  Right  AIMS (if indicated): not done  Assets:  Communication Skills Desire for Improvement Financial Resources/Insurance Housing Leisure Time Physical Health Resilience Social Support Talents/Skills Vocational/Educational  ADL's:  Intact  Cognition: Impaired,  Mild  Sleep:  Negative   Screenings: AIMS    Flowsheet Row Video [facility] Office Visit from 11[facility]27/2021 in [facility] Office Visit from 11/06/2021 in [facility] Office Visit from 10[facility]23/2021 in [facility] Office Visit from 10/16/2021 [facility]  AIMS Total Score 0 4 7 5 21    Flowsheet Row ED to Hosp-Admission (Discharged) from [facility]20[facility]2022 in [facility]  SURGICAL ED from 06/13/2022 in [facility] Emergency Department at [facility] ED from 05/25/2022 in [facility]  C-SSRS RISK CATEGORY Low Risk No Risk No Risk     Assessment  SUDS in remission Psychosis in remission on Haldol  Periodontal care proceeding Needs to FU with  PCP        and Plan:  Refilled medications FU week beforeThanksgiving to avoid running out of meds Sooner if needed    Carlin Emmer, PA-C 01/13/2024, 5:10 PM

## 2024-03-16 ENCOUNTER — Encounter (HOSPITAL_COMMUNITY): Payer: Self-pay | Admitting: Medical

## 2024-03-16 ENCOUNTER — Encounter (HOSPITAL_COMMUNITY): Payer: Self-pay

## 2024-03-16 ENCOUNTER — Telehealth (HOSPITAL_BASED_OUTPATIENT_CLINIC_OR_DEPARTMENT_OTHER): Payer: Self-pay | Admitting: Medical

## 2024-03-16 DIAGNOSIS — I6381 Other cerebral infarction due to occlusion or stenosis of small artery: Secondary | ICD-10-CM

## 2024-03-16 DIAGNOSIS — I639 Cerebral infarction, unspecified: Secondary | ICD-10-CM

## 2024-03-16 DIAGNOSIS — D320 Benign neoplasm of cerebral meninges: Secondary | ICD-10-CM

## 2024-03-16 DIAGNOSIS — F1921 Other psychoactive substance dependence, in remission: Secondary | ICD-10-CM

## 2024-03-16 DIAGNOSIS — F060 Psychotic disorder with hallucinations due to known physiological condition: Secondary | ICD-10-CM

## 2024-03-16 DIAGNOSIS — E039 Hypothyroidism, unspecified: Secondary | ICD-10-CM

## 2024-03-16 DIAGNOSIS — Z923 Personal history of irradiation: Secondary | ICD-10-CM

## 2024-03-16 DIAGNOSIS — Z8659 Personal history of other mental and behavioral disorders: Secondary | ICD-10-CM

## 2024-03-16 DIAGNOSIS — G250 Essential tremor: Secondary | ICD-10-CM

## 2024-03-16 NOTE — Progress Notes (Signed)
 Virtual Visit via Video Note  I connected with [name] on 03/16/24 at  3:00 PM EST by a video enabled telemedicine application and verified that I am speaking with the correct person using two identifiers.  Location: Patient: Paul Bray Provider: [facility] [place]   I discussed the limitations of evaluation and management by telemedicine and the availability of in person appointments. The patient expressed understanding and agreed to proceed.  History of Present Illness:    Observations/Objective:   Assessment and Plan:   Follow Up Instructions:    I discussed the assessment and treatment plan with the patient. The patient was provided an opportunity to ask questions and all were answered. The patient agreed with the plan and demonstrated an understanding of the instructions.   The patient was advised to call back or seek an in-person evaluation if the symptoms worsen or if the condition fails to improve as anticipated.  I provided Paul minutes of non-face-to-face time during this encounter.   Carlin Emmer, PA-C

## 2024-03-30 ENCOUNTER — Telehealth (HOSPITAL_COMMUNITY): Admitting: Medical

## 2024-03-30 ENCOUNTER — Encounter (HOSPITAL_COMMUNITY): Payer: Self-pay | Admitting: Medical

## 2024-03-30 DIAGNOSIS — F060 Psychotic disorder with hallucinations due to known physiological condition: Secondary | ICD-10-CM

## 2024-03-30 DIAGNOSIS — G250 Essential tremor: Secondary | ICD-10-CM

## 2024-03-30 DIAGNOSIS — D320 Benign neoplasm of cerebral meninges: Secondary | ICD-10-CM

## 2024-03-30 DIAGNOSIS — I6381 Other cerebral infarction due to occlusion or stenosis of small artery: Secondary | ICD-10-CM

## 2024-03-30 DIAGNOSIS — F1910 Other psychoactive substance abuse, uncomplicated: Secondary | ICD-10-CM

## 2024-03-30 DIAGNOSIS — Z923 Personal history of irradiation: Secondary | ICD-10-CM

## 2024-03-30 DIAGNOSIS — F1921 Other psychoactive substance dependence, in remission: Secondary | ICD-10-CM

## 2024-03-30 DIAGNOSIS — G4710 Hypersomnia, unspecified: Secondary | ICD-10-CM

## 2024-03-30 DIAGNOSIS — E039 Hypothyroidism, unspecified: Secondary | ICD-10-CM

## 2024-03-30 DIAGNOSIS — I639 Cerebral infarction, unspecified: Secondary | ICD-10-CM

## 2024-03-30 DIAGNOSIS — Z8659 Personal history of other mental and behavioral disorders: Secondary | ICD-10-CM

## 2024-03-30 MED ORDER — HALOPERIDOL 5 MG PO TABS: 5.0000 mg | ORAL_TABLET | Freq: Every day | ORAL | 1 refills | Status: AC

## 2024-03-30 MED ORDER — MODAFINIL 200 MG PO TABS: 600.0000 mg | ORAL_TABLET | Freq: Every day | ORAL | 2 refills | Status: AC

## 2024-03-30 MED ORDER — GABAPENTIN 400 MG PO CAPS: 400.0000 mg | ORAL_CAPSULE | Freq: Every day | ORAL | 1 refills | Status: AC

## 2024-03-30 MED ORDER — LEVOTHYROXINE SODIUM 150 MCG PO TABS: 150.0000 ug | ORAL_TABLET | Freq: Every day | ORAL | 0 refills | Status: AC

## 2024-03-30 MED ORDER — BENZTROPINE MESYLATE 2 MG PO TABS: 2.0000 mg | ORAL_TABLET | Freq: Two times a day (BID) | ORAL | 1 refills | Status: AC

## 2024-03-30 NOTE — Progress Notes (Signed)
 BH MD/PA/NP OP Progress Note  04/03/2024 3:01 PM Paul Bray  MRN:  [id number] Virtual Visit via Video Note  I connected with Paul Bray on 04/03/24 at  3:00 PM EST by a video enabled telemedicine application and verified that I am speaking with the correct person using two identifiers.  Location: Patient: at home Provider: [facility]   I discussed the limitations of evaluation and management by telemedicine and the availability of in person appointments. The patient expressed understanding and agreed to proceed.   History of Present Illness:See EPIC note    Observations/Objective:See EPIC note   Assessment and Plan:See EPIC note   Follow Up Instructions:See EPIC note    I discussed the assessment and treatment plan with the patient. The patient was provided an opportunity to ask questions and all were answered. The patient agreed with the plan and demonstrated an understanding of the instructions.   The patient was advised to call back or seek an in-person evaluation if the symptoms worsen or if the condition fails to improve as anticipated.  I provided 20 minutes of non-face-to-face time during this encounter.   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 He had failed every 2nd generation antipsychotic tried. He had been tapered to 1 mg of Haldol  prior to the psychotic relapse but his mother reported he was not takin it and he had broken into secured medications and begun abusing his Modanaifil.[name] He continues to report being psychosis free since stabilization at [facility] and discharge  at 5mg  Haldol  daily He remains uninterested in a further taper.He has no TD symptoms. He reports he has taken up playing his guitar again which for him is also a form of exercise as he can only play standing up.He says he gets his pulse rate up to 126. You tube is at a standstill as he has run out of video material but what he has done continues to run. He continues to walk his dog who he describes as great He continues to live with his mother and their relationship continues to be good. He continues regular visits with Suboxone  Clinic providers  He wants refills on his thyroid  and skin meds and says he has an appt with PCP within the month to get these switched to PCP for refill in the future.  Visit Diagnosis:    ICD-10-CM   1. Polysubstance dependence in early, early partial, sustained full, or sustained partial remission (HCC)  F19.21     2. Drug abuse, IV (HCC)  F19.10     3. Multiple lacunar infarcts (HCC)  I63.81     4. Infarction of left basal ganglia (HCC)  I63.9     5. 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 Hypothyroidism, unspecified type  E03.9     7. Acquired hypothyroidism  E03.9     8. Meningioma, cerebral (HCC)  D32.0     9. Status post gamma knife treatment  Z92.3     10. Familial tremor  G25.0     11. History  of ADHD  Z86.59     12. Persistent hypersomnia  G47.10       Past Psychiatric History:  [facility] Date of recent admission: 10/29/21 Date of recent discharge: 11/01/21 [facility]  [facility] OP [place] 6/17/-03/04/2020  Pt with Opiate dependence MAT Suboxone  at [employer] [facility] Dr Gradie C/O of paranoia he is aware of but cant stop/completely control.Has rx for Risperdal from [employer]. Also hx of Gamma Knife tradiation fo benign brain tumor ?2018 Says has records Requesting Psychiatrist/medication mangement for paranoia hx [facility] OP [place] 08/21/2021 - NOW [facility] INPATIENT   Date of Admission:  10/02/2021 Date of Discharge: 10/13/2021  Date of Admission:06/13/2022 [facility] ED)  Date of Discharge:06/16/22  Date of Admission: 06/25/2022  Date of Discharge:06/29/2022   [name] 05/27/2022- 06/03/2022   [place] Alcohol intoxication 03/26/2017  Alcohol withdrawal delirium 03/26/2017  Chronic daily headache 02/25/2017  Alcohol use disorder, severe, dependence 02/22/2017  Heroin dependence MAT Methadone Metro/Subutex  TBR 01/2018  Anxiety disorder, unspecified 01/20/2017  Paranoia 03/2018    [facility] Paul Elora KIDD, NP - 04/12/2017 12:38 PM EST Formatting of this note might be different from the original. [facility] - Substance Abuse PHP/IOP Intake Assessment  BH Formulate an individual and appropriate relapse prevention plan.   BH SU Problem: Formation of a relapse prevention plan    [facility] Uncomplicated opioid dependence (CMS/HCC) 12/24/2017  1. Discharge from hospital. 2. Follow up at [facility]     Past Medical History:  CARE EVERYWHERE review 02/22/2024 10:30 AM EDT 03/08/2024 8:00 AM EST Office Visit   Office Visit [facility]  [address] [place] Paul Bray  [place], [place] [address]  [phone]  Paul Bray, DDS MS  [address]  [place], [place] [address]  [phone] (Work)  [phone] (Fax)  Generalized chronic severe periodontitis (Primary Dx  02/22/2024  GINGIVAL FLAP PROCEDURE, INCLUDING ROOT PLANING - ONE TO THREE CONTIGUOUS TEETH  Reason for Visit -03/08/2024 Reason Comments  Continuing Care Post-op UL flap   [facility] 2021-Present Medication-assisted treatment services, our team will provide in-house professional counseling to clients who take OUD medication.   Past Medical History:  Diagnosis Date   ADHD    Anxiety    Brain tumor (HCC)    balance and occ memory issues and headaches   Brain tumor (HCC)    sees [facility] q year    Depression    Headache    migraine and tension   Hypothyroidism    Soft tissue mass    left shoulder   Substance abuse (HCC)     Past Surgical History:  Procedure Laterality Date   colonscopy     gamma knife radiation 2018 for brain tumor'     hematoma removed from arm Left    MASS EXCISION Left 06/30/2019   Procedure: EXCISION OF SOFT TISSUE  MASS LEFT SHOULDER;  Surgeon: Eletha Boas, MD;  Location: [facility];  Service: General;  Laterality: Left;  LMA  02/22/2024 [facility]  GINGIVAL FLAP PROCEDURE, INCLUDING ROOT PLANING - ONE TO THREE CONTIGUOUS TEETH   Family Psychiatric History:  Alcoholism and drug abuse on father's side   Family History:  [facility]  Hypertension Mother  Hypertension Father    Social History:           Socioeconomic History    Marital status: Single      Spouse name: Not on  file   Number of children: None   Years of education: GED age 13   Highest education level: above  Occupational History   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 Data is from another encounter. Last Filed Value  Patient's Job has Been Impacted by Current Illness YesPatient's Job has Been Impacted by Current Illness. Yes. Data is from another encounter. Last Filed Value  What is the Longest Time Patient has Held a Job? 25 yearsWhat is the Longest Time Patient has Held a Job?. 25 years. Data is from another encounter. 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 Data is from another encounter. Last Filed Value     Tobacco Use   Smoking status: Former      Types: Cigarettes   Smokeless tobacco: Current   Tobacco comments:      Vapes nicotene products  Vaping Use   Vaping status: Every Day  Substance and  Sexual Activity   Alcohol use: Not Currently      Comment: quit Oct 2019   Drug use: Not Currently      Types: Cocaine, IV, Heroin, MDMA (Ecstacy)      Comment: last reported use; cocaine and heroin in 2019; MDMA Feb 2023   Sexual activity: Not Currently  Other Topics     Not on file  Social History Narrative   Not on file    Social Determinants of Health           Financial Resource Strain: Low Risk  (07/10/2020)    Received from [facility] visits prior to 06/27/2022., [facility] visits prior to 06/27/2022.    Overall Financial Resource Strain (CARDIA)     Difficulty of Paying Living Expenses: Not hard at all  Food Insecurity: No Food Insecurity (07/10/2020)    Received from [facility] visits prior to 06/27/2022., [facility] visits prior to 06/27/2022.    Hunger Vital Sign     Worried About Running Out of Food in the Last Year: Never true     Ran Out of Food in the Last Year: Never true  Transportation Needs: No Transportation Needs (07/10/2020)    Received from [facility] visits prior to 06/27/2022., [facility] visits prior to 06/27/2022.    PRAPARE - Therapist, Art (Medical): No     Lack of Transportation (Non-Medical): No  Physical Activity: Unknown (07/10/2020)    [facility] visits prior to 06/27/2022., [facility] visits prior to 06/27/2022.    Exercise Vital Sign     Days of Exercise per Week: Not on file     Minutes of Exercise per Session: 0 min  Stress: No Stress Concern Present (07/10/2020)    [facility] visits prior to 06/27/2022., [facility] visits prior to 06/27/2022.    [employer] of Occupational Health - Occupational Stress Questionnaire     Feeling of Stress : Not at all  Social Connections: Unknown  (09/07/2021)    Received from [facility], [facility]    Social Network     Social Network: Not on file  Allergies:  Allergies  Allergen Reactions   Ingrezza  [Valbenazine  Tosylate] Other (See Comments)    Severe tongue chewing at 80 mg Tolerates lower doses   Naloxone  Palpitations    Pt report MAT Rx is Subutex /Buprenorphine  only   Amphetamine-Dextroamphetamine Other (See Comments)    unknown    Metabolic Disorder Labs: Lab Results  Component Value Date   HGBA1C 5.0 10/03/2021   MPG 96.8 10/03/2021   No results found for: PROLACTIN Lab Results  Component Value Date   CHOL 191 10/03/2021   TRIG 92 10/03/2021   HDL 48 10/03/2021   CHOLHDL 4.0 10/03/2021   VLDL 18 10/03/2021   LDLCALC 125 (H) 10/03/2021   Lab Results  Component Value Date   TSH 3.257 06/22/2022   TSH 1.500 02/04/2022    Therapeutic Level Labs:NA   PDMP  03/14/2024 03/06/2024  1 Buprenorphine  8 Mg Tablet Sl 90.00          03/08/2024 01/13/2024  1 Modafinil  200 Mg Tablet 90.00           Current Medications: Current Outpatient Medications  Medication Sig Dispense Refill   chlorhexidine  (PERIDEX ) 0.12 % solution      ibuprofen (ADVIL) 800 MG tablet Take 800 mg by mouth every 6 (six) hours as needed.     benztropine  (COGENTIN ) 2 MG tablet Take 1 tablet (2 mg total) by mouth 2 (two) times daily. 180 tablet 1   buprenorphine  (SUBUTEX ) 8 MG SUBL SL tablet Place 8 mg under the tongue 3 (three) times daily.     diphenhydrAMINE  (BENADRYL ) 50 MG tablet Take 2-4 tablets q6h as needed for restlessness related to Haldol  (Patient taking differently: Take 25 mg by mouth every 8 (eight) hours as needed (For restlessness per patient).) 100 tablet 2   doxycycline  (VIBRAMYCIN ) 50 MG capsule Take 1 capsule (50 mg total) by mouth daily. 90 capsule 1   finasteride  (PROSCAR ) 5 MG tablet Take 5 mg by mouth daily.     Fluocinolone Acetonide 0.01 % OIL      gabapentin  (NEURONTIN ) 400 MG capsule Take 1 capsule  (400 mg total) by mouth daily for 180 doses. 90 capsule 1   haloperidol  (HALDOL ) 5 MG tablet Take 1 tablet (5 mg total) by mouth at bedtime. 90 tablet 1   hydrocortisone 2.5 % lotion Apply 1 Application topically.     levothyroxine  (SYNTHROID ) 150 MCG tablet Take 1 tablet (150 mcg total) by mouth daily. 90 tablet 0   metroNIDAZOLE  (METROGEL ) 1 % gel Apply topically daily. 45 g 0   modafinil  (PROVIGIL ) 200 MG tablet Take 3 tablets (600 mg total) by mouth daily for 90 doses. 90 tablet 2   nicotine  (NICODERM CQ  - DOSED IN MG/24 HOURS) 21 mg/24hr patch Place 21 mg onto the skin daily.     tamsulosin  (FLOMAX ) 0.4 MG CAPS capsule Take by mouth.     No current facility-administered medications for this visit.   Musculoskeletal: Strength & Muscle Tone: Telepsych visit-Grossly normal Musculoskeletal and cranial nerve inspections Gait & Station: NA Patient leans: N/A  Psychiatric Specialty Exam: Review of Systems  Constitutional:  Positive for activity change (has begun to play guitar again). Negative for appetite change, chills, diaphoresis, fatigue, fever and unexpected weight change.  HENT:  Positive for dental problem (see PMH). Negative for congestion, drooling, ear discharge, ear pain, facial swelling, hearing loss, mouth sores, nosebleeds, postnasal drip, rhinorrhea, sinus pressure, sinus pain, sneezing, sore throat, tinnitus, trouble swallowing and voice change.   Eyes: Negative.  Respiratory:  Negative for apnea, cough, choking, chest tightness, shortness of breath, wheezing and stridor.   Cardiovascular:  Negative for chest pain, palpitations and leg swelling.  Gastrointestinal: Negative.   Endocrine: Negative for cold intolerance, heat intolerance, polydipsia, polyphagia and polyuria.       Hypothyroid  Genitourinary:  Negative for decreased urine volume, difficulty urinating, dysuria, enuresis, flank pain, frequency, genital sores, hematuria, penile discharge, penile pain, penile  swelling, scrotal swelling, testicular pain and urgency.  Musculoskeletal: Negative.   Skin:  Positive for rash (rosacea). Negative for color change, pallor and wound.  Allergic/Immunologic: Negative for environmental allergies, food allergies and immunocompromised state.  Neurological:  Negative for dizziness, tremors, seizures, syncope, facial asymmetry, speech difficulty, weakness, light-headedness, numbness and headaches.  Hematological: Negative.   Psychiatric/Behavioral:  Positive for dysphoric mood and sleep disturbance. Negative for agitation, behavioral problems, confusion, decreased concentration, hallucinations, self-injury and suicidal ideas. The patient is nervous/anxious. The patient is not hyperactive.     There were no vitals taken for this visit.There is no height or weight on file to calculate BMI.MY CHART VISIT [facility]  Last Filed Vital Signs - documented in this encounter  Last Filed Vital Signs Vital Sign Reading Time Taken Comments  Blood Pressure 105/74 03/08/2024 8:02 AM EST    Pulse 102 03/08/2024 8:02 AM EST    Temperature 36.3 C (97.3 F) 03/08/2024 8:02 AM EST    Respiratory Rate - -    Oxygen Saturation 95% 03/08/2024 8:02 AM EST    Inhaled Oxygen Concentration - -    Weight 88.9 kg (196 lb) 03/08/2024 8:02 AM EST    Height 180.3 cm (5' 11) 03/08/2024 8:02 AM EST    Body Mass Index 27.34 03/08/2024 8:02 AM EST      General Appearance: Casual and Well Groomed  Eye Contact:  Good  Speech:  Clear and Coherent and Normal Rate  Volume:  Normal  Mood:  Euthymic  Affect:  Appropriate and Congruent  Thought Process:  Coherent, Goal Directed, and Descriptions of Associations: Intact  Orientation:  Full (Time, Place, and Person)  Thought Content: WDL and Logical   Suicidal Thoughts:  No  Homicidal Thoughts:  No  Memory:  Affected by SUDS/Psychotic episodes ? radiation   Judgement:   Appears to be Intact   Insight:  Present  Psychomotor  Activity:  Negative  Concentration:  Concentration: Good and Attention Span: Good  Recall:  See memory  Fund of Knowledge: WDL  Language: Good  Akathisia:  Negative  Handed:  Right  AIMS (if indicated): Grossly negative  Assets:  Desire for Improvement Financial Resources/Insurance Housing Resilience Social Support Talents/Skills Transportation Vocational/Educational  ADL's:  Intact  Cognition: WNL for visit   Sleep:  Rx Modonafil   Screenings: AIMS    Flowsheet Row Video [facility] Visit from 11/27/2021 in [facility] Office Visit from 11[facility]06/2021 in [facility] Office Visit from 10[facility]23/2021 in [facility] Office Visit from 10/16/2021 [facility]  AIMS Total Score 0 4 7 5 21    Flowsheet Row ED to Hosp-Admission (Discharged) from [facility]20/2022 in [facility]  SURGICAL ED from 06/13/2022 in [facility] Emergency Department at [facility] ED from 05/25/2022 in [facility] Emergency Department at [facility]  C-SSRS RISK CATEGORY Low Risk No Risk No Risk     Assessment Stable  No indication for change in treatment except to get medical prescritions from PCP going forward     and Plan: [place] (done) FU 6 months Sooner if needed Keep appts with other providers      Paul Emmer, PA-C 04/03/2024, 3:01 PM

## 2024-04-03 MED ORDER — DOXYCYCLINE HYCLATE 50 MG PO CAPS: 50.0000 mg | ORAL_CAPSULE | Freq: Every day | ORAL | 1 refills | Status: AC

## 2024-09-28 ENCOUNTER — Telehealth (HOSPITAL_COMMUNITY): Admitting: Medical
